# Patient Record
Sex: Female | Born: 1960 | Race: Black or African American | Hispanic: No | Marital: Single | Smoking: Former smoker
Health system: Southern US, Community
[De-identification: ages and names within clinical notes are randomized; demographics above are authoritative.]

## PROBLEM LIST (undated history)

## (undated) DIAGNOSIS — E119 Type 2 diabetes mellitus without complications: Secondary | ICD-10-CM

## (undated) DIAGNOSIS — R079 Chest pain, unspecified: Secondary | ICD-10-CM

## (undated) DIAGNOSIS — Z8701 Personal history of pneumonia (recurrent): Secondary | ICD-10-CM

## (undated) DIAGNOSIS — D126 Benign neoplasm of colon, unspecified: Secondary | ICD-10-CM

## (undated) DIAGNOSIS — I509 Heart failure, unspecified: Secondary | ICD-10-CM

## (undated) DIAGNOSIS — R51 Headache: Secondary | ICD-10-CM

## (undated) DIAGNOSIS — E66812 Obesity, class 2: Secondary | ICD-10-CM

## (undated) DIAGNOSIS — I2510 Atherosclerotic heart disease of native coronary artery without angina pectoris: Secondary | ICD-10-CM

## (undated) DIAGNOSIS — F329 Major depressive disorder, single episode, unspecified: Secondary | ICD-10-CM

## (undated) DIAGNOSIS — E669 Obesity, unspecified: Secondary | ICD-10-CM

## (undated) DIAGNOSIS — K219 Gastro-esophageal reflux disease without esophagitis: Secondary | ICD-10-CM

## (undated) DIAGNOSIS — M545 Low back pain, unspecified: Secondary | ICD-10-CM

## (undated) DIAGNOSIS — K760 Fatty (change of) liver, not elsewhere classified: Secondary | ICD-10-CM

## (undated) DIAGNOSIS — F32A Depression, unspecified: Secondary | ICD-10-CM

## (undated) DIAGNOSIS — K649 Unspecified hemorrhoids: Secondary | ICD-10-CM

## (undated) DIAGNOSIS — F419 Anxiety disorder, unspecified: Secondary | ICD-10-CM

## (undated) DIAGNOSIS — T7840XA Allergy, unspecified, initial encounter: Secondary | ICD-10-CM

## (undated) DIAGNOSIS — I10 Essential (primary) hypertension: Secondary | ICD-10-CM

## (undated) DIAGNOSIS — G8929 Other chronic pain: Secondary | ICD-10-CM

## (undated) DIAGNOSIS — K828 Other specified diseases of gallbladder: Secondary | ICD-10-CM

## (undated) DIAGNOSIS — E785 Hyperlipidemia, unspecified: Secondary | ICD-10-CM

## (undated) DIAGNOSIS — J189 Pneumonia, unspecified organism: Secondary | ICD-10-CM

## (undated) DIAGNOSIS — M1990 Unspecified osteoarthritis, unspecified site: Secondary | ICD-10-CM

## (undated) DIAGNOSIS — M25519 Pain in unspecified shoulder: Secondary | ICD-10-CM

## (undated) DIAGNOSIS — R7303 Prediabetes: Secondary | ICD-10-CM

## (undated) HISTORY — DX: Chest pain, unspecified: R07.9

## (undated) HISTORY — DX: Essential (primary) hypertension: I10

## (undated) HISTORY — DX: Unspecified osteoarthritis, unspecified site: M19.90

## (undated) HISTORY — DX: Obesity, class 2: E66.812

## (undated) HISTORY — DX: Personal history of pneumonia (recurrent): Z87.01

## (undated) HISTORY — DX: Other chronic pain: G89.29

## (undated) HISTORY — DX: Depression, unspecified: F32.A

## (undated) HISTORY — PX: TONSILLECTOMY: SUR1361

## (undated) HISTORY — DX: Other specified diseases of gallbladder: K82.8

## (undated) HISTORY — DX: Gastro-esophageal reflux disease without esophagitis: K21.9

## (undated) HISTORY — PX: LUMBAR DISC SURGERY: SHX700

## (undated) HISTORY — DX: Allergy, unspecified, initial encounter: T78.40XA

## (undated) HISTORY — DX: Anxiety disorder, unspecified: F41.9

## (undated) HISTORY — DX: Fatty (change of) liver, not elsewhere classified: K76.0

## (undated) HISTORY — DX: Unspecified hemorrhoids: K64.9

## (undated) HISTORY — PX: TISSUE GRAFT: SHX251

## (undated) HISTORY — DX: Major depressive disorder, single episode, unspecified: F32.9

## (undated) HISTORY — DX: Low back pain: M54.5

## (undated) HISTORY — DX: Low back pain, unspecified: M54.50

## (undated) HISTORY — DX: Hyperlipidemia, unspecified: E78.5

## (undated) HISTORY — DX: Obesity, unspecified: E66.9

## (undated) HISTORY — DX: Benign neoplasm of colon, unspecified: D12.6

---

## 1995-03-26 HISTORY — PX: BRAIN TUMOR EXCISION: SHX577

## 1997-06-27 ENCOUNTER — Encounter: Admission: RE | Admit: 1997-06-27 | Discharge: 1997-06-27 | Payer: Self-pay | Admitting: Family Medicine

## 1997-07-12 ENCOUNTER — Encounter: Admission: RE | Admit: 1997-07-12 | Discharge: 1997-07-12 | Payer: Self-pay | Admitting: Family Medicine

## 1997-09-28 ENCOUNTER — Ambulatory Visit (HOSPITAL_COMMUNITY): Admission: RE | Admit: 1997-09-28 | Discharge: 1997-09-28 | Payer: Self-pay | Admitting: Neurological Surgery

## 1998-05-16 ENCOUNTER — Encounter: Admission: RE | Admit: 1998-05-16 | Discharge: 1998-05-16 | Payer: Self-pay | Admitting: Sports Medicine

## 1998-09-15 ENCOUNTER — Encounter: Admission: RE | Admit: 1998-09-15 | Discharge: 1998-09-15 | Payer: Self-pay | Admitting: Family Medicine

## 1999-03-01 ENCOUNTER — Encounter: Admission: RE | Admit: 1999-03-01 | Discharge: 1999-03-01 | Payer: Self-pay | Admitting: Family Medicine

## 2000-02-27 ENCOUNTER — Encounter: Admission: RE | Admit: 2000-02-27 | Discharge: 2000-02-27 | Payer: Self-pay | Admitting: Family Medicine

## 2000-03-06 ENCOUNTER — Encounter: Admission: RE | Admit: 2000-03-06 | Discharge: 2000-03-06 | Payer: Self-pay | Admitting: Family Medicine

## 2000-03-20 ENCOUNTER — Encounter: Admission: RE | Admit: 2000-03-20 | Discharge: 2000-03-20 | Payer: Self-pay | Admitting: Family Medicine

## 2000-03-24 ENCOUNTER — Encounter: Admission: RE | Admit: 2000-03-24 | Discharge: 2000-03-24 | Payer: Self-pay | Admitting: *Deleted

## 2001-03-04 ENCOUNTER — Encounter: Admission: RE | Admit: 2001-03-04 | Discharge: 2001-03-04 | Payer: Self-pay | Admitting: Family Medicine

## 2001-11-02 ENCOUNTER — Encounter: Admission: RE | Admit: 2001-11-02 | Discharge: 2001-11-02 | Payer: Self-pay | Admitting: Family Medicine

## 2001-11-09 ENCOUNTER — Encounter: Admission: RE | Admit: 2001-11-09 | Discharge: 2001-11-09 | Payer: Self-pay | Admitting: Family Medicine

## 2003-02-11 ENCOUNTER — Encounter: Admission: RE | Admit: 2003-02-11 | Discharge: 2003-02-11 | Payer: Self-pay | Admitting: Sports Medicine

## 2003-02-14 ENCOUNTER — Encounter: Admission: RE | Admit: 2003-02-14 | Discharge: 2003-02-14 | Payer: Self-pay | Admitting: Family Medicine

## 2003-03-07 ENCOUNTER — Encounter: Admission: RE | Admit: 2003-03-07 | Discharge: 2003-03-07 | Payer: Self-pay | Admitting: Family Medicine

## 2003-03-11 ENCOUNTER — Ambulatory Visit (HOSPITAL_COMMUNITY): Admission: RE | Admit: 2003-03-11 | Discharge: 2003-03-11 | Payer: Self-pay | Admitting: Family Medicine

## 2003-04-27 ENCOUNTER — Encounter: Admission: RE | Admit: 2003-04-27 | Discharge: 2003-04-27 | Payer: Self-pay | Admitting: Sports Medicine

## 2004-03-07 ENCOUNTER — Ambulatory Visit: Payer: Self-pay | Admitting: Family Medicine

## 2004-12-27 ENCOUNTER — Ambulatory Visit: Payer: Self-pay | Admitting: Family Medicine

## 2004-12-28 ENCOUNTER — Encounter: Admission: RE | Admit: 2004-12-28 | Discharge: 2004-12-28 | Payer: Self-pay | Admitting: Sports Medicine

## 2005-01-07 ENCOUNTER — Ambulatory Visit: Payer: Self-pay | Admitting: Family Medicine

## 2005-05-23 ENCOUNTER — Encounter (INDEPENDENT_AMBULATORY_CARE_PROVIDER_SITE_OTHER): Payer: Self-pay | Admitting: *Deleted

## 2005-05-23 LAB — CONVERTED CEMR LAB

## 2005-05-25 ENCOUNTER — Emergency Department (HOSPITAL_COMMUNITY): Admission: EM | Admit: 2005-05-25 | Discharge: 2005-05-25 | Payer: Self-pay | Admitting: Emergency Medicine

## 2005-06-12 ENCOUNTER — Ambulatory Visit: Payer: Self-pay | Admitting: Family Medicine

## 2005-09-27 ENCOUNTER — Ambulatory Visit: Payer: Self-pay | Admitting: Family Medicine

## 2005-12-19 ENCOUNTER — Ambulatory Visit: Payer: Self-pay | Admitting: Sports Medicine

## 2006-05-23 ENCOUNTER — Encounter (INDEPENDENT_AMBULATORY_CARE_PROVIDER_SITE_OTHER): Payer: Self-pay | Admitting: *Deleted

## 2006-08-04 ENCOUNTER — Ambulatory Visit: Payer: Self-pay | Admitting: Sports Medicine

## 2006-08-04 DIAGNOSIS — I10 Essential (primary) hypertension: Secondary | ICD-10-CM

## 2006-08-04 DIAGNOSIS — E669 Obesity, unspecified: Secondary | ICD-10-CM | POA: Insufficient documentation

## 2006-08-08 ENCOUNTER — Telehealth (INDEPENDENT_AMBULATORY_CARE_PROVIDER_SITE_OTHER): Payer: Self-pay | Admitting: *Deleted

## 2006-08-11 ENCOUNTER — Telehealth: Payer: Self-pay | Admitting: Family Medicine

## 2006-08-22 ENCOUNTER — Ambulatory Visit: Payer: Self-pay | Admitting: Family Medicine

## 2006-09-08 ENCOUNTER — Telehealth: Payer: Self-pay | Admitting: *Deleted

## 2006-09-08 ENCOUNTER — Ambulatory Visit: Payer: Self-pay | Admitting: Family Medicine

## 2006-09-08 DIAGNOSIS — K219 Gastro-esophageal reflux disease without esophagitis: Secondary | ICD-10-CM | POA: Insufficient documentation

## 2006-09-08 DIAGNOSIS — J309 Allergic rhinitis, unspecified: Secondary | ICD-10-CM | POA: Insufficient documentation

## 2006-09-15 ENCOUNTER — Ambulatory Visit: Payer: Self-pay | Admitting: Family Medicine

## 2006-09-15 DIAGNOSIS — F418 Other specified anxiety disorders: Secondary | ICD-10-CM | POA: Insufficient documentation

## 2006-09-18 ENCOUNTER — Telehealth: Payer: Self-pay | Admitting: *Deleted

## 2006-09-18 ENCOUNTER — Encounter (INDEPENDENT_AMBULATORY_CARE_PROVIDER_SITE_OTHER): Payer: Self-pay | Admitting: Family Medicine

## 2006-09-18 ENCOUNTER — Encounter: Admission: RE | Admit: 2006-09-18 | Discharge: 2006-09-18 | Payer: Self-pay | Admitting: Sports Medicine

## 2006-09-18 ENCOUNTER — Ambulatory Visit: Payer: Self-pay | Admitting: Sports Medicine

## 2006-09-18 LAB — CONVERTED CEMR LAB
BUN: 21 mg/dL (ref 6–23)
Basophils Absolute: 0.2 10*3/uL
CO2: 29 meq/L (ref 19–32)
Calcium: 9.1 mg/dL (ref 8.4–10.5)
Chloride: 99 meq/L (ref 96–112)
Creatinine, Ser: 1.12 mg/dL (ref 0.40–1.20)
Eosinophils Absolute: 0.7 10*3/uL
Glucose, Bld: 94 mg/dL (ref 70–99)
Granulocyte count absolute: 4 10*3/uL
Granulocyte percent: 51.9 %
HCT: 36.3 %
Hemoglobin: 12.2 g/dL
Lymphocytes Relative: 43.2 %
Lymphs Abs: 3.3 10*3/uL
MCV: 86.1 fL
Monocytes Absolute: 0.4 10*3/uL
Monocytes Relative: 4.9 %
Platelets: 192 10*3/uL
Potassium: 3.4 meq/L — ABNORMAL LOW (ref 3.5–5.3)
RBC: 4.22 M/uL
Sodium: 140 meq/L (ref 135–145)
WBC: 7.7 10*3/uL

## 2006-09-19 ENCOUNTER — Telehealth (INDEPENDENT_AMBULATORY_CARE_PROVIDER_SITE_OTHER): Payer: Self-pay | Admitting: Family Medicine

## 2006-09-21 ENCOUNTER — Encounter (INDEPENDENT_AMBULATORY_CARE_PROVIDER_SITE_OTHER): Payer: Self-pay | Admitting: Family Medicine

## 2006-10-07 ENCOUNTER — Ambulatory Visit: Payer: Self-pay | Admitting: Family Medicine

## 2006-10-16 ENCOUNTER — Telehealth: Payer: Self-pay | Admitting: Family Medicine

## 2006-10-21 ENCOUNTER — Telehealth (INDEPENDENT_AMBULATORY_CARE_PROVIDER_SITE_OTHER): Payer: Self-pay | Admitting: *Deleted

## 2007-01-27 ENCOUNTER — Telehealth: Payer: Self-pay | Admitting: *Deleted

## 2007-02-02 ENCOUNTER — Telehealth: Payer: Self-pay | Admitting: *Deleted

## 2007-02-02 ENCOUNTER — Telehealth (INDEPENDENT_AMBULATORY_CARE_PROVIDER_SITE_OTHER): Payer: Self-pay | Admitting: *Deleted

## 2007-03-17 ENCOUNTER — Telehealth: Payer: Self-pay | Admitting: Family Medicine

## 2007-03-30 ENCOUNTER — Ambulatory Visit: Payer: Self-pay | Admitting: Sports Medicine

## 2007-04-24 ENCOUNTER — Telehealth: Payer: Self-pay | Admitting: *Deleted

## 2007-05-25 ENCOUNTER — Ambulatory Visit (HOSPITAL_COMMUNITY): Admission: RE | Admit: 2007-05-25 | Discharge: 2007-05-25 | Payer: Self-pay | Admitting: Family Medicine

## 2007-05-26 ENCOUNTER — Inpatient Hospital Stay (HOSPITAL_COMMUNITY): Admission: EM | Admit: 2007-05-26 | Discharge: 2007-05-27 | Payer: Self-pay | Admitting: Emergency Medicine

## 2007-05-26 ENCOUNTER — Ambulatory Visit: Payer: Self-pay | Admitting: Family Medicine

## 2007-05-27 ENCOUNTER — Encounter: Payer: Self-pay | Admitting: Family Medicine

## 2007-05-29 ENCOUNTER — Telehealth: Payer: Self-pay | Admitting: Family Medicine

## 2007-06-03 ENCOUNTER — Encounter: Payer: Self-pay | Admitting: Family Medicine

## 2007-06-05 ENCOUNTER — Ambulatory Visit: Payer: Self-pay | Admitting: Family Medicine

## 2007-06-10 ENCOUNTER — Telehealth: Payer: Self-pay | Admitting: Family Medicine

## 2007-09-11 ENCOUNTER — Telehealth: Payer: Self-pay | Admitting: *Deleted

## 2007-12-30 ENCOUNTER — Ambulatory Visit: Payer: Self-pay | Admitting: Family Medicine

## 2007-12-30 ENCOUNTER — Telehealth: Payer: Self-pay | Admitting: *Deleted

## 2008-01-05 ENCOUNTER — Telehealth (INDEPENDENT_AMBULATORY_CARE_PROVIDER_SITE_OTHER): Payer: Self-pay | Admitting: *Deleted

## 2008-01-05 ENCOUNTER — Ambulatory Visit: Payer: Self-pay | Admitting: Family Medicine

## 2008-01-06 ENCOUNTER — Telehealth: Payer: Self-pay | Admitting: *Deleted

## 2008-02-26 ENCOUNTER — Ambulatory Visit: Payer: Self-pay | Admitting: Family Medicine

## 2008-03-02 ENCOUNTER — Telehealth: Payer: Self-pay | Admitting: *Deleted

## 2008-04-01 ENCOUNTER — Encounter: Payer: Self-pay | Admitting: Family Medicine

## 2008-04-01 ENCOUNTER — Ambulatory Visit: Payer: Self-pay | Admitting: Family Medicine

## 2008-04-04 LAB — CONVERTED CEMR LAB
BUN: 21 mg/dL (ref 6–23)
CO2: 25 meq/L (ref 19–32)
Calcium: 8.5 mg/dL (ref 8.4–10.5)
Chloride: 103 meq/L (ref 96–112)
Cholesterol: 235 mg/dL — ABNORMAL HIGH (ref 0–200)
Creatinine, Ser: 1.05 mg/dL (ref 0.40–1.20)
Glucose, Bld: 91 mg/dL (ref 70–99)
HDL: 40 mg/dL (ref >39)
LDL Cholesterol: 134 mg/dL — ABNORMAL HIGH (ref 0–99)
Potassium: 4 meq/L (ref 3.5–5.3)
Sodium: 141 meq/L (ref 135–145)
Total CHOL/HDL Ratio: 5.9
Triglycerides: 307 mg/dL — ABNORMAL HIGH (ref <150)
VLDL: 61 mg/dL — ABNORMAL HIGH (ref 0–40)

## 2008-04-19 ENCOUNTER — Ambulatory Visit: Payer: Self-pay | Admitting: Family Medicine

## 2008-04-26 ENCOUNTER — Ambulatory Visit (HOSPITAL_COMMUNITY): Admission: RE | Admit: 2008-04-26 | Discharge: 2008-04-26 | Payer: Self-pay | Admitting: Family Medicine

## 2008-04-26 ENCOUNTER — Encounter: Payer: Self-pay | Admitting: Family Medicine

## 2008-04-26 ENCOUNTER — Ambulatory Visit: Payer: Self-pay | Admitting: Family Medicine

## 2008-04-26 DIAGNOSIS — N938 Other specified abnormal uterine and vaginal bleeding: Secondary | ICD-10-CM | POA: Insufficient documentation

## 2008-04-27 ENCOUNTER — Telehealth: Payer: Self-pay | Admitting: Family Medicine

## 2008-04-29 ENCOUNTER — Encounter: Payer: Self-pay | Admitting: Family Medicine

## 2008-04-29 ENCOUNTER — Ambulatory Visit: Payer: Self-pay | Admitting: Family Medicine

## 2008-04-29 DIAGNOSIS — D259 Leiomyoma of uterus, unspecified: Secondary | ICD-10-CM | POA: Insufficient documentation

## 2008-05-05 ENCOUNTER — Ambulatory Visit: Payer: Self-pay | Admitting: Gynecology

## 2008-05-05 ENCOUNTER — Encounter: Payer: Self-pay | Admitting: Gynecology

## 2008-05-05 ENCOUNTER — Other Ambulatory Visit: Admission: RE | Admit: 2008-05-05 | Discharge: 2008-05-05 | Payer: Self-pay | Admitting: Gynecology

## 2008-05-05 ENCOUNTER — Encounter: Payer: Self-pay | Admitting: Family Medicine

## 2008-06-02 ENCOUNTER — Ambulatory Visit: Payer: Self-pay | Admitting: Gynecology

## 2008-06-02 ENCOUNTER — Encounter: Payer: Self-pay | Admitting: Family Medicine

## 2008-06-17 ENCOUNTER — Ambulatory Visit: Payer: Self-pay | Admitting: Gynecology

## 2008-08-15 ENCOUNTER — Encounter: Payer: Self-pay | Admitting: Family Medicine

## 2008-10-17 ENCOUNTER — Telehealth: Payer: Self-pay | Admitting: *Deleted

## 2008-11-02 ENCOUNTER — Telehealth: Payer: Self-pay | Admitting: *Deleted

## 2008-11-17 ENCOUNTER — Telehealth (INDEPENDENT_AMBULATORY_CARE_PROVIDER_SITE_OTHER): Payer: Self-pay | Admitting: *Deleted

## 2008-12-26 ENCOUNTER — Ambulatory Visit: Payer: Self-pay | Admitting: Family Medicine

## 2009-01-13 ENCOUNTER — Telehealth: Payer: Self-pay | Admitting: Family Medicine

## 2009-01-13 ENCOUNTER — Encounter: Payer: Self-pay | Admitting: Family Medicine

## 2009-01-13 ENCOUNTER — Ambulatory Visit: Payer: Self-pay | Admitting: Family Medicine

## 2009-01-13 DIAGNOSIS — M545 Low back pain, unspecified: Secondary | ICD-10-CM | POA: Insufficient documentation

## 2009-02-07 ENCOUNTER — Encounter: Payer: Self-pay | Admitting: Family Medicine

## 2009-03-01 ENCOUNTER — Telehealth: Payer: Self-pay | Admitting: Family Medicine

## 2009-03-01 ENCOUNTER — Ambulatory Visit: Payer: Self-pay | Admitting: Family Medicine

## 2009-03-10 ENCOUNTER — Encounter: Payer: Self-pay | Admitting: Family Medicine

## 2009-03-14 ENCOUNTER — Telehealth: Payer: Self-pay | Admitting: *Deleted

## 2009-03-15 ENCOUNTER — Encounter: Payer: Self-pay | Admitting: Family Medicine

## 2009-04-25 ENCOUNTER — Encounter: Payer: Self-pay | Admitting: Family Medicine

## 2009-05-22 ENCOUNTER — Ambulatory Visit: Payer: Self-pay | Admitting: Family Medicine

## 2009-05-22 ENCOUNTER — Telehealth: Payer: Self-pay | Admitting: Family Medicine

## 2009-07-27 ENCOUNTER — Ambulatory Visit: Payer: Self-pay | Admitting: Family Medicine

## 2009-07-27 ENCOUNTER — Encounter: Payer: Self-pay | Admitting: *Deleted

## 2009-07-27 ENCOUNTER — Encounter: Payer: Self-pay | Admitting: Family Medicine

## 2009-07-31 ENCOUNTER — Emergency Department (HOSPITAL_COMMUNITY): Admission: EM | Admit: 2009-07-31 | Discharge: 2009-08-01 | Payer: Self-pay | Admitting: Emergency Medicine

## 2009-07-31 ENCOUNTER — Telehealth: Payer: Self-pay | Admitting: Family Medicine

## 2009-07-31 ENCOUNTER — Emergency Department (HOSPITAL_COMMUNITY): Admission: EM | Admit: 2009-07-31 | Discharge: 2009-07-31 | Payer: Self-pay | Admitting: Emergency Medicine

## 2009-08-04 ENCOUNTER — Encounter: Payer: Self-pay | Admitting: *Deleted

## 2009-08-04 ENCOUNTER — Ambulatory Visit: Payer: Self-pay | Admitting: Family Medicine

## 2009-08-04 ENCOUNTER — Encounter: Payer: Self-pay | Admitting: Family Medicine

## 2009-08-04 DIAGNOSIS — E785 Hyperlipidemia, unspecified: Secondary | ICD-10-CM | POA: Insufficient documentation

## 2009-08-04 LAB — CONVERTED CEMR LAB
ALT: 14 units/L (ref 0–35)
AST: 14 units/L (ref 0–37)
Albumin: 4.2 g/dL (ref 3.5–5.2)
Alkaline Phosphatase: 70 units/L (ref 39–117)
BUN: 14 mg/dL (ref 6–23)
CO2: 25 meq/L (ref 19–32)
Calcium: 9.3 mg/dL (ref 8.4–10.5)
Chloride: 103 meq/L (ref 96–112)
Cholesterol: 214 mg/dL — ABNORMAL HIGH (ref 0–200)
Creatinine, Ser: 0.96 mg/dL (ref 0.40–1.20)
Glucose, Bld: 94 mg/dL (ref 70–99)
HCT: 38.8 % (ref 36.0–46.0)
HDL: 39 mg/dL — ABNORMAL LOW (ref >39)
Hemoglobin: 12.4 g/dL (ref 12.0–15.0)
LDL Cholesterol: 134 mg/dL — ABNORMAL HIGH (ref 0–99)
MCHC: 32 g/dL (ref 30.0–36.0)
MCV: 85.8 fL (ref 78.0–100.0)
Platelets: 201 10*3/uL (ref 150–400)
Potassium: 4.5 meq/L (ref 3.5–5.3)
RBC: 4.52 M/uL (ref 3.87–5.11)
RDW: 16.7 % — ABNORMAL HIGH (ref 11.5–15.5)
Sodium: 137 meq/L (ref 135–145)
TSH: 0.856 microintl units/mL (ref 0.350–4.500)
Total Bilirubin: 0.4 mg/dL (ref 0.3–1.2)
Total CHOL/HDL Ratio: 5.5
Total Protein: 6.6 g/dL (ref 6.0–8.3)
Triglycerides: 206 mg/dL — ABNORMAL HIGH (ref <150)
VLDL: 41 mg/dL — ABNORMAL HIGH (ref 0–40)
WBC: 7.1 10*3/uL (ref 4.0–10.5)

## 2009-08-05 ENCOUNTER — Telehealth: Payer: Self-pay | Admitting: *Deleted

## 2009-08-07 ENCOUNTER — Encounter: Payer: Self-pay | Admitting: Family Medicine

## 2009-08-24 ENCOUNTER — Encounter: Payer: Self-pay | Admitting: Family Medicine

## 2009-09-01 ENCOUNTER — Encounter (INDEPENDENT_AMBULATORY_CARE_PROVIDER_SITE_OTHER): Payer: Self-pay | Admitting: *Deleted

## 2009-09-03 ENCOUNTER — Emergency Department (HOSPITAL_COMMUNITY): Admission: EM | Admit: 2009-09-03 | Discharge: 2009-09-03 | Payer: Self-pay | Admitting: Emergency Medicine

## 2009-09-11 ENCOUNTER — Ambulatory Visit (HOSPITAL_COMMUNITY): Admission: RE | Admit: 2009-09-11 | Discharge: 2009-09-11 | Payer: Self-pay | Admitting: Neurological Surgery

## 2009-09-12 ENCOUNTER — Ambulatory Visit: Payer: Self-pay | Admitting: Family Medicine

## 2009-09-12 ENCOUNTER — Encounter (INDEPENDENT_AMBULATORY_CARE_PROVIDER_SITE_OTHER): Payer: Self-pay | Admitting: *Deleted

## 2009-09-12 DIAGNOSIS — K8020 Calculus of gallbladder without cholecystitis without obstruction: Secondary | ICD-10-CM | POA: Insufficient documentation

## 2009-09-14 ENCOUNTER — Telehealth: Payer: Self-pay | Admitting: Family Medicine

## 2009-10-24 ENCOUNTER — Ambulatory Visit: Admission: RE | Admit: 2009-10-24 | Discharge: 2009-10-24 | Payer: Self-pay | Admitting: Neurological Surgery

## 2010-01-23 ENCOUNTER — Encounter: Payer: Self-pay | Admitting: Family Medicine

## 2010-02-02 ENCOUNTER — Ambulatory Visit: Payer: Self-pay | Admitting: Family Medicine

## 2010-02-02 ENCOUNTER — Encounter: Payer: Self-pay | Admitting: Family Medicine

## 2010-02-28 ENCOUNTER — Ambulatory Visit: Payer: Self-pay

## 2010-03-29 ENCOUNTER — Ambulatory Visit: Admit: 2010-03-29 | Payer: Self-pay

## 2010-04-26 NOTE — Letter (Signed)
Summary: Out of Work  [facility]  [address]   [place], [place] [address]   Phone: [phone]  Fax: [phone]    Jul 27, 2009   Employee:  TERRYL NIZIOLEK    To Whom It May Concern:   For Medical reasons, please excuse the above named employee from work for the following dates:  Start:   Jul 25, 2009  End:   Jul 28, 2009  If you need additional information, please feel free to contact our office.         Sincerely,    Loralee Pacas CMA

## 2010-04-26 NOTE — Consult Note (Signed)
Summary: [facility]  [facility]   Imported By: Clydell Hakim 08/21/2009 09:04:22  _____________________________________________________________________  External Attachment:    Type:   Image     Comment:   External Document

## 2010-04-26 NOTE — Miscellaneous (Signed)
Summary: SUMMARY  50 y/o female with family h/o early cardiac death. Has HTN, HLD. Has had normal cath (2009).   Impression & Recommendations:  Problem # 1:  HYPERTENSION, BENIGN ESSENTIAL (ICD-401.1) Assessment Comment Only BP pretty much at goal on current regimen. Amlodipine was actually added by cardiology prior to maximizing lisinopril-hctz combo. no problems tolerating medications.   Her updated medication list for this problem includes:    Metoprolol Tartrate 25 Mg Tabs (Metoprolol tartrate) .Marland Kitchen... 1 tab by mouth two times a day    Lisinopril-hydrochlorothiazide 20-12.5 Mg Tabs (Lisinopril-hydrochlorothiazide) ..... One tab by mouth daily    Amlodipine Besylate 5 Mg Tabs (Amlodipine besylate) ..... One tab by mouth daily  Prior BP: 135/90 (08/04/2009)  Labs Reviewed: K+: 4.5 (08/04/2009) Creat: : 0.96 (08/04/2009)   Chol: 214 (08/04/2009)   HDL: 39 (08/04/2009)   LDL: 134 (08/04/2009)   TG: 206 (08/04/2009)  Problem # 2:  HYPERLIPIDEMIA (ICD-272.4) Assessment: Comment Only patient against statins in past. unable to exercise much 2/2 back pain.   Labs Reviewed: SGOT: 14 (08/04/2009)   SGPT: 14 (08/04/2009)   HDL:39 (08/04/2009), 40 (04/01/2008)  LDL:134 (08/04/2009), 134 (04/01/2008)  Chol:214 (08/04/2009), 235 (04/01/2008)  Trig:206 (08/04/2009), 307 (04/01/2008)  Problem # 3:  BACK PAIN, LUMBAR (ICD-724.2) Assessment: Comment Only seen by Dr. Danielle Dess at [facility]. several recent flare ups requiring courses of steroids and hydrocodone-acetaminophen combo. Vicodin NOT chronic med, and patient is does not seem to be the type to ask for lots of pain medications. She has not in the past.   The following medications were removed from the medication list:    Hydrocodone-acetaminophen 5-500 Mg Tabs (Hydrocodone-acetaminophen) ..... One tab by mouth q4-q6 hours as needed for pain. Her updated medication list for this problem includes:    Flexeril 10 Mg Tabs  (Cyclobenzaprine hcl) ..... One by mouth three times a day as needed back pain    Ibuprofen 800 Mg Tabs (Ibuprofen) .Marland Kitchen... 1 by mouth three times a day prn  Problem # 4:  ANXIETY STATE NOS (ICD-300.00) Assessment: Comment Only previously on clonazepam. thought to be etiology of chest pain issues in the past. last refill in February 2011. No refill requests as of yet. Will remove from list to be addressed by next provider, unless request obtained in next 2-3 weeks.   The following medications were removed from the medication list:    Klonopin 0.5 Mg Tabs (Clonazepam) ..... One tab by mouth three times a day as needed  Problem # 5:  DYSFUNCTIONAL UTERINE BLEEDING (ICD-626.8) Assessment: Comment Only seen by [facility] in past. trial of Depo-Lupron last year. several small fibroids.   Problem # 6:  GERD (ICD-530.81) Assessment: Comment Only on protonix.   Her updated medication list for this problem includes:    Protonix 40 Mg Tbec (Pantoprazole sodium) ..... One tab by mouth daily  Problem # 7:  RHINITIS, ALLERGIC NOS (ICD-477.9) Assessment: Comment Only recently prescribed patanol for allergic conjunctivitis/eyelid swelling.   Her updated medication list for this problem includes:    Loratadine 10 Mg Tabs (Loratadine) ..... One tab by mouth daily  Complete Medication List: 1)  Flexeril 10 Mg Tabs (Cyclobenzaprine hcl) .... One by mouth three times a day as needed back pain 2)  Ibuprofen 800 Mg Tabs (Ibuprofen) .Marland Kitchen.. 1 by mouth three times a day prn 3)  Protonix 40 Mg Tbec (Pantoprazole sodium) .... One tab by mouth daily 4)  Metoprolol Tartrate 25 Mg Tabs (Metoprolol tartrate) .Marland KitchenMarland KitchenMarland Kitchen  1 tab by mouth two times a day 5)  Lisinopril-hydrochlorothiazide 20-12.5 Mg Tabs (Lisinopril-hydrochlorothiazide) .... One tab by mouth daily 6)  Amlodipine Besylate 5 Mg Tabs (Amlodipine besylate) .... One tab by mouth daily 7)  Loratadine 10 Mg Tabs (Loratadine) .... One tab by mouth daily 8)   Patanol 0.1 % Soln (Olopatadine hcl) .... One drop in affected eye twice a day. disp 5 ml.

## 2010-04-26 NOTE — Assessment & Plan Note (Signed)
Summary: F/U HTN/Sign Medical Consult form/dsl   Vital Signs:  Patient profile:   50 year old female Height:      68 inches Weight:      222.25 pounds BMI:     33.92 Temp:     98.1 degrees F oral Pulse rate:   85 / minute BP sitting:   165 / 117  (left arm) Cuff size:   large  Vitals Entered By: Jimmy Footman, CMA (February 02, 2010 3:25 PM) CC: meet new doc, refill HTN & pain meds, knee & back pain 10/10 Is Patient Diabetic? No Pain Assessment Patient in pain? yes     Location: back & knees Intensity: 10 Type: sharp   Primary Provider:  Barnabas Lister  MD  CC:  meet new doc, refill HTN & pain meds, and knee & back pain 10/10.  History of Present Illness: 50 yo female presents to clinic to meet her new MD, refil meds, and get medical consent for teeth cleaning.  Pt c/o chronic bilateral knee pain and low back pain >20 years.   Has been seen by Dr. Danielle Dess, neurosurgeon, who was going to operate, but pt lost her job and her insurance.  He refills her Vicodin.  She also takes Motrin 800mg  for pain and Flexeril 15mg .    Pt also has HTN and anxiety.  Her BP today is 165/117.  Rechecked manually 140/90.  Needs refills for Amlodipine and Lisinopril/HCTZ.  Medical consent for dental cleaning requires a blood pressure range safe for teeth cleaning.  Pt currently taking Clonazepam 1mg  three times a day for anxiety.    ROS: Endorses back pain.  Denies fever, CP, SOB, fatigue, falls, N/V, abdominal pain.  Preventive Screening-Counseling & Management  Alcohol-Tobacco     Smoking Status: quit  Current Medications (verified): 1)  Flexeril 10 Mg  Tabs (Cyclobenzaprine Hcl) .... One By Mouth Three Times A Day As Needed Back Pain 2)  Ibuprofen 800 Mg  Tabs (Ibuprofen) .Marland Kitchen.. 1 By Mouth Three Times A Day Prn 3)  Protonix 40 Mg  Tbec (Pantoprazole Sodium) .... One Tab By Mouth Two Times A Day 4)  Metoprolol Tartrate 50 Mg Tabs (Metoprolol Tartrate) .... Take 1 Tab By Mouth Two Times Per Day 5)   Lisinopril-Hydrochlorothiazide 20-12.5 Mg Tabs (Lisinopril-Hydrochlorothiazide) .... One Tab By Mouth Daily 6)  Amlodipine Besylate 5 Mg Tabs (Amlodipine Besylate) .... One Tab By Mouth Daily 7)  Loratadine 10 Mg Tabs (Loratadine) .... One Tab By Mouth Daily 8)  Patanol 0.1 % Soln (Olopatadine Hcl) .... One Drop in Affected Eye Twice A Day. Disp 5 Ml. 9)  Clonazepam 1 Mg Tabs (Clonazepam) .... One Tab By Mouth Every 12 Hrs As Needed For Anxiety 10)  Ambien 5 Mg Tabs (Zolpidem Tartrate) .... Take One Tablet At Bedtime As Needed For Insomnia.  Allergies (verified): No Known Drug Allergies  Social History: Smoking Status:  quit  Review of Systems       per HPI  Physical Exam  General:  alert and cooperative, in no acute distress Lungs:  Normal respiratory effort, chest expands symmetrically. Lungs are clear to auscultation, no crackles or wheezes. Heart:  Normal rate and regular rhythm. S1 and S2 normal without gallop, murmur, click, rub or other extra sounds. Neurologic:  alert & oriented X3, cranial nerves II-XII intact, and gait normal.     Impression & Recommendations:  Problem # 1:  ANXIETY STATE NOS (ICD-300.00) Assessment Unchanged Pt has a hx of anxiety and  depression.  Pt says she cannot tolerate SSRI due to adverse effects/anger issues.  Pt was started on Clonazepam and dosage was increased to 1mg  three times a day in June.  Encouraged pt to take Clonazepam twice a day and we will meet in 1 month to discuss switching to SSRI with less side effects.  Will give pt enough tablets to take 2-3 tablets a day as needed.  Her updated medication list for this problem includes:    Clonazepam 1 Mg Tabs (Clonazepam) ..... One tab by mouth every 12 hrs as needed for anxiety  Orders: [facility]- Est Level  3 ([address])  Problem # 2:  HYPERTENSION, BENIGN ESSENTIAL (ICD-401.1) Assessment: Deteriorated BP elevated at 165/117.  Rechecked manually 140/90.  Signed medical consent for dental  cleaning.  Will refill hypertensive meds.  Will continue to monitor at next visit.  Her updated medication list for this problem includes:    Metoprolol Tartrate 50 Mg Tabs (Metoprolol tartrate) .Marland Kitchen... Take 1 tab by mouth two times per day    Lisinopril-hydrochlorothiazide 20-12.5 Mg Tabs (Lisinopril-hydrochlorothiazide) ..... One tab by mouth daily    Amlodipine Besylate 5 Mg Tabs (Amlodipine besylate) ..... One tab by mouth daily  Orders: [facility]- Est Level  3 ([address])  Problem # 3:  BACK PAIN, LUMBAR (ICD-724.2) Assessment: Comment Only Pt gets Vicodin from Dr. Danielle Dess.  Told pt she will need to get refills from his office.  Will refill Motrin.  Pt denies any GI upset.  Will consider changing to Tylenol at next visit.  Her updated medication list for this problem includes:    Flexeril 10 Mg Tabs (Cyclobenzaprine hcl) ..... One by mouth three times a day as needed back pain    Ibuprofen 800 Mg Tabs (Ibuprofen) .Marland Kitchen... 1 by mouth three times a day prn  Orders: [facility]- Est Level  3 ([address])  Patient Instructions: 1)  It was great to meet you today. 2)  Please take Clonazepam 1mg  every 12 hours as needed for anxiety. 3)  Please schedule a follow-up appt with me in 1 month. 4)  Thank you. Prescriptions: CLONAZEPAM 1 MG TABS (CLONAZEPAM) one tab by mouth every 12 hrs as needed for anxiety  #80 x 0   Entered and Authorized by:   Ivy de Lawson Radar  MD   Signed by:   Barnabas Lister  MD on 02/02/2010   Method used:   Print then Give to Patient   RxID:   [id number] AMBIEN 5 MG TABS (ZOLPIDEM TARTRATE) take one tablet at bedtime as needed for insomnia.  #30 x 0   Entered and Authorized by:   Ivy de Lawson Radar  MD   Signed by:   Ivy de Lawson Radar  MD on 02/02/2010   Method used:   Print then Give to Patient   RxID:   [id number] CLONAZEPAM 1 MG TABS (CLONAZEPAM) one tab by mouth every 12 hrs as needed for anxiety  #80 x 0   Entered and Authorized by:   Ivy de Lawson Radar  MD   Signed by:   Barnabas Lister  MD on 02/02/2010   Method used:   Print then Give to Patient   RxID:   [id number] LISINOPRIL-HYDROCHLOROTHIAZIDE 20-12.5 MG TABS (LISINOPRIL-HYDROCHLOROTHIAZIDE) one tab by mouth daily  #30 x 3   Entered and Authorized by:   Ivy de Lawson Radar  MD   Signed by:   Barnabas Lister  MD on 02/02/2010   Method  used:   Electronically to        [facility] (retail)       [address]       [place], [place]  [address]       Ph: [phone]       Fax: [phone]   RxID:   [id number] AMLODIPINE BESYLATE 5 MG TABS (AMLODIPINE BESYLATE) one tab by mouth daily  #30 x 3   Entered and Authorized by:   Ivy de Lawson Radar  MD   Signed by:   Barnabas Lister  MD on 02/02/2010   Method used:   Electronically to        [facility] (retail)       [address]       [place], [place]  [address]       Ph: [phone]       Fax: [phone]   RxID:   [id number] METOPROLOL TARTRATE 50 MG TABS (METOPROLOL TARTRATE) Take 1 tab by mouth two times per day  #60 x 3   Entered and Authorized by:   Ivy de Lawson Radar  MD   Signed by:   Barnabas Lister  MD on 02/02/2010   Method used:   Electronically to        [facility] (retail)       [address]       [place], [place]  [address]       Ph: [phone]       Fax: [phone]   RxID:   [id number] PROTONIX 40 MG  TBEC (PANTOPRAZOLE SODIUM) one tab by mouth two times a day  #60 x 3   Entered and Authorized by:   Ivy de Lawson Radar  MD   Signed by:   Barnabas Lister  MD on 02/02/2010   Method used:   Electronically to        [facility] (retail)       [address]       [place], [place]  [address]       Ph: [phone]       Fax: [phone]   RxID:   [id number] IBUPROFEN 800 MG  TABS (IBUPROFEN) 1 by mouth three times a day prn  #90 x 3   Entered and Authorized by:   Ivy de Lawson Radar  MD   Signed by:   Barnabas Lister   MD on 02/02/2010   Method used:   Electronically to        [facility] (retail)       [address]       [place], [place]  [address]       Ph: [phone]       Fax: [phone]   RxID:   [id number] FLEXERIL 10 MG  TABS (CYCLOBENZAPRINE HCL) one by mouth three times a day as needed back pain  #90 x 3   Entered and Authorized by:   Ivy de Lawson Radar  MD   Signed by:   Barnabas Lister  MD on 02/02/2010   Method used:   Electronically to        [facility]       [address]       [place], [place]  [address]       Ph: [phone]       Fax: [phone]   RxID:   [id number]    Orders Added: 1)  [facility]- Est Level  3 [[address]]

## 2010-04-26 NOTE — Miscellaneous (Signed)
Summary: [facility]  ROI   Imported By: [name] 08/07/2009 16:46:19  _____________________________________________________________________  External Attachment:    Type:   Image     Comment:   External Document

## 2010-04-26 NOTE — Miscellaneous (Signed)
Summary: [facility]  [facility]   Imported By: Clydell Hakim 09/20/2009 16:45:59  _____________________________________________________________________  External Attachment:    Type:   Image     Comment:   External Document

## 2010-04-26 NOTE — Assessment & Plan Note (Signed)
Summary: HTN, Anxiety, CP   Vital Signs:  Patient Profile:   50 Years Old Female Height:     68 inches (172.72 cm) Weight:      225.3 pounds BMI:     34.38 Temp:     98.3 degrees F oral Pulse rate:   58 / minute BP sitting:   169 / 104  (left arm) Cuff size:   large  Pt. in pain?   yes    Location:   ha  Vitals Entered By: Garen Grams LPN (February 26, 2008 9:43 AM)                  Visit Type:  office visit PCP:  Lequita Asal  MD  Chief Complaint:  BP, anxiety, and chest pain.  History of Present Illness: 50 y/o female here for f/u:  HTN- BP has been elevated for several months with SBPs >160s, DBPs >100s. taking metoprolol and lisinopril as prescribed. has had headache, chest pain. denies SOB, palpitations, peripheral edema, orthopnea, PND.   Anxiety- off clonazepam for several months. experienced some withdrawal symptoms. ?chest pain related to panic attacks.   Chest pain- reports last episode in June. brought on with exertion, relieved by rest. cardiac cath in 3/09 without significant disease. on metoprolol and pravastatin. not on daily aspirin.     Current Allergies: No known allergies       Physical Exam  General:     Well-developed,well-nourished,in no acute distress; alert,appropriate and cooperative throughout examination Neck:     no JVD Chest Wall:     non tender to palpation Lungs:     Normal respiratory effort, chest expands symmetrically. Lungs are clear to auscultation, no crackles or wheezes. Heart:     Normal rate and regular rhythm. S1 and S2 normal without gallop, murmur, click, rub or other extra sounds. Abdomen:     Bowel sounds positive,abdomen soft and non-tender without masses Pulses:     2+ distal pulses Extremities:     no edema    Impression & Recommendations:  Problem # 1:  HYPERTENSION, BENIGN ESSENTIAL (ICD-401.1) Assessment: Deteriorated will add diuretic in combo pill. given bradycardia, will decrease  metoprolol to 12.5 mg two times a day. f/u in 1 month.   Her updated medication list for this problem includes:    Metoprolol Tartrate 25 Mg Tabs (Metoprolol tartrate) .Marland Kitchen... 1/2 tab by mouth bid    Lisinopril-hydrochlorothiazide 20-25 Mg Tabs (Lisinopril-hydrochlorothiazide) ..... One tab by mouth daily  Orders: [facility]- Est Level  3 ([address])   Problem # 2:  ANXIETY STATE NOS (ICD-300.00) Assessment: Deteriorated resume clonazepam 0.5 mg three times a day as needed. tried on SSRIs in the past per her report and didn't tolerate.   Her updated medication list for this problem includes:    Klonopin 0.5 Mg Tabs (Clonazepam) ..... One tab by mouth three times a day as needed  Orders: [facility]- Est Level  3 ([address])   Problem # 3:  CHEST PAIN (ICD-786.50) Assessment: Comment Only no recent episodes. however, would like patient to f/u with Dr. Reyes Ivan since it has been >6 months and see if he has any further recommendations. appt scheduled for monday 12/7 at 11:40 Orders: [facility]- Est Level  3 ([address])   Complete Medication List: 1)  Ambien 10 Mg Tabs (Zolpidem tartrate) .... Take 1 tablet by mouth at bedtime 2)  Flexeril 10 Mg Tabs (Cyclobenzaprine hcl) .Marland Kitchen.. 1 by mouth daily when back pain 3)  Ibuprofen 800 Mg  Tabs (Ibuprofen) .Marland Kitchen.. 1 by mouth three times a day prn 4)  Protonix 40 Mg Tbec (Pantoprazole sodium) .... One tab by mouth daily 5)  Klonopin 0.5 Mg Tabs (Clonazepam) .... One tab by mouth three times a day as needed 6)  Nitroquick 0.4 Mg Subl (Nitroglycerin) .... One tab sublingual q5 minutes as needed chest pain; may repeat up to 3 doses in 15 minutes 7)  Metoprolol Tartrate 25 Mg Tabs (Metoprolol tartrate) .... 1/2 tab by mouth bid 8)  Lisinopril-hydrochlorothiazide 20-25 Mg Tabs (Lisinopril-hydrochlorothiazide) .... One tab by mouth daily 9)  Diclofenac Sodium 50 Mg Tbec (Diclofenac sodium) .... One tab by mouth q8-q12 hours a day as needed for pain 10)  Pravastatin Sodium 20 Mg Tabs  (Pravastatin sodium) .... One by mouth at bedtime   Patient Instructions: 1)  Follow up with Dr. Lanier Prude in 1 month regarding blood pressure.  2)  It is important that you exercise regularly at least 20 minutes 5 times a week. If you develop chest pain, have severe difficulty breathing, or feel very tired , stop exercising immediately and seek medical attention.   Prescriptions: KLONOPIN 0.5 MG  TABS (CLONAZEPAM) one tab by mouth three times a day as needed  #90 x 5   Entered and Authorized by:   Lequita Asal  MD   Signed by:   Lequita Asal  MD on 02/26/2008   Method used:   Print then Give to Patient   RxID:   [id number] AMBIEN 10 MG TABS (ZOLPIDEM TARTRATE) Take 1 tablet by mouth at bedtime  #30 x 5   Entered and Authorized by:   Lequita Asal  MD   Signed by:   Lequita Asal  MD on 02/26/2008   Method used:   Print then Give to Patient   RxID:   [id number] PRAVASTATIN SODIUM 20 MG  TABS (PRAVASTATIN SODIUM) one by mouth at bedtime  #30 x 5   Entered and Authorized by:   Lequita Asal  MD   Signed by:   Lequita Asal  MD on 02/26/2008   Method used:   Electronically to        [facility] (retail)       [address]       [place], [place]  [address]       Ph: [phone]       Fax: [phone]   RxID:   [id number] DICLOFENAC SODIUM 50 MG TBEC (DICLOFENAC SODIUM) one tab by mouth Q8-Q12 hours a day as needed for pain  #60 x 5   Entered and Authorized by:   Lequita Asal  MD   Signed by:   Lequita Asal  MD on 02/26/2008   Method used:   Electronically to        [facility] (retail)       [address]       [place], [place]  [address]       Ph: [phone]       Fax: [phone]   RxID:   [id number] NITROQUICK 0.4 MG  SUBL (NITROGLYCERIN) one tab sublingual Q5 minutes as needed chest pain; may repeat up to 3 doses in 15 minutes  #30 x 0   Entered and Authorized by:   Lequita Asal  MD   Signed by:   Lequita Asal   MD on 02/26/2008   Method used:   Electronically to        [facility] (retail)       [address]       [place], [place]  [address]       Ph: [phone]       Fax: [phone]   RxID:   [id number] PROTONIX 40 MG  TBEC (PANTOPRAZOLE SODIUM) one tab by mouth daily  #30 x 5   Entered and Authorized by:   Lequita Asal  MD   Signed by:   Lequita Asal  MD on 02/26/2008   Method used:   Electronically to        [facility] (retail)       [address]       [place], [place]  [address]       Ph: [phone]       Fax: [phone]   RxID:   [id number] IBUPROFEN 800 MG  TABS (IBUPROFEN) 1 by mouth three times a day prn  #90 x 5   Entered and Authorized by:   Lequita Asal  MD   Signed by:   Lequita Asal  MD on 02/26/2008   Method used:   Electronically to        [facility] (retail)       [address]       [place], [place]  [address]       Ph: [phone]       Fax: [phone]   RxID:   [id number] FLEXERIL 10 MG  TABS (CYCLOBENZAPRINE HCL) 1 by mouth daily when back pain  #30 x 5   Entered and Authorized by:   Lequita Asal  MD   Signed by:   Lequita Asal  MD on 02/26/2008   Method used:   Electronically to        [facility] (retail)       [address]       [place], [place]  [address]       Ph: [phone]       Fax: [phone]   RxID:   [id number] METOPROLOL TARTRATE 25 MG  TABS (METOPROLOL TARTRATE) 1/2 tab by mouth BID  #30 x 5   Entered and Authorized by:   Lequita Asal  MD   Signed by:   Lequita Asal  MD on 02/26/2008   Method used:   Electronically to        [facility] (retail)       [address]       [place], [place]  [address]       Ph: [phone]       Fax: [phone]   RxID:   [id number] LISINOPRIL-HYDROCHLOROTHIAZIDE 20-25 MG TABS (LISINOPRIL-HYDROCHLOROTHIAZIDE) one tab by mouth daily  #30 x 5   Entered and Authorized by:   Lequita Asal  MD   Signed by:   Lequita Asal  MD on 02/26/2008   Method used:   Electronically to        [facility]* (retail)       [address]       [place], [place]  [address]       Ph: [phone]       Fax: [phone]   RxID:   [id number]  [email or web address]  Appended Document: Orders Update    Clinical Lists Changes  Orders: Added new Referral order of Cardiology Referral (Cardiology) - Signed

## 2010-04-26 NOTE — Progress Notes (Signed)
Summary: triage  Phone Note Call from Patient Call back at Home Phone [phone]   Caller: Patient Summary of Call: having bad back spasms wanting to be seen this afternoon. Initial call taken by: Clydell Hakim,  November 02, 2008 12:11 PM  Follow-up for Phone Call        Returned call, no answer.  LVMM for her to call us back. Follow-up by: Dennison Nancy RN,  November 02, 2008 12:18 PM  Additional Follow-up for Phone Call Additional follow up Details #1::        Has been having back spasms for 2 weeks now and are getting worse.  Has been taking the Flexeril with some relief but is almost out of the medication.  Is asking to be seen.  [place] appt made for 1:30. Additional Follow-up by: Dennison Nancy RN,  November 02, 2008 12:24 PM

## 2010-04-26 NOTE — Progress Notes (Signed)
Summary: triage  Phone Note Call from Patient Call back at Home Phone [phone]   Caller: Patient Summary of Call: pt requesting to be seen today because of their back. Initial call taken by: Clydell Hakim,  March 01, 2009 2:07 PM  Follow-up for Phone Call        states her back is "really bothering me" work in with pcp today Follow-up by: Golden Circle RN,  March 01, 2009 2:13 PM

## 2010-04-26 NOTE — Progress Notes (Signed)
  Phone Note Outgoing Call   Call placed by: Lequita Asal  MD,  Aug 05, 2009 8:57 PM Summary of Call: please inform all recent labs unremarkable. cholesterol still high patient is aware of this from previous Initial call taken by: Lequita Asal  MD,  Aug 05, 2009 8:58 PM  Follow-up for Phone Call        spoke with pt concerning labs Follow-up by: Loralee Pacas CMA,  Aug 07, 2009 8:57 AM

## 2010-04-26 NOTE — Letter (Signed)
Summary: Out of Work  [facility]  [address]   [place], [place] [address]   Phone: [phone]  Fax: [phone]    May 27, 2007   Employee:  ICEL CASTLES    To Whom It May Concern:   For Medical reasons and recent hospitalization, please excuse the above named employee from work for the following dates:  Start:   Tuesday May 26, 2007  End:   Monday June 01, 2007  If you need additional information, please feel free to contact our office.         Sincerely,    Lequita Asal  MD

## 2010-04-26 NOTE — Miscellaneous (Signed)
Summary: Endometrial Biopsy  Consent:  Endometrial Biopsy   Imported By: Haydee Salter 05/18/2008 13:46:50  _____________________________________________________________________  External Attachment:    Type:   Image     Comment:   External Document

## 2010-04-26 NOTE — Assessment & Plan Note (Signed)
Summary: flu-like illness   Vital Signs:  Patient profile:   50 year old female Weight:      218.4 pounds BMI:     33.33 Temp:     98.9 degrees F oral Pulse rate:   78 / minute Pulse rhythm:   regular BP sitting:   143 / 97  (right arm) Cuff size:   large  Vitals Entered By: Loralee Pacas CMA (May 22, 2009 3:13 PM) CC: cold symptoms Comments cold x 1 week   Primary Care Provider:  Lequita Asal  MD  CC:  cold symptoms.  History of Present Illness: 50yo F here w/ flu-like symptoms  Flu-like symptoms- x 1 wk.  Worsening course.  Cough (productive), body aches, chills, N/V/diarrhea.  No fevers.  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  Taking ibuprofen, nyquil, robitussin.    Current Medications (verified): 1)  Ambien 10 Mg Tabs (Zolpidem Tartrate) .... Take 1 Tablet By Mouth At Bedtime 2)  Flexeril 10 Mg  Tabs (Cyclobenzaprine Hcl) .... One By Mouth Three Times A Day As Needed Back Pain 3)  Ibuprofen 800 Mg  Tabs (Ibuprofen) .Marland Kitchen.. 1 By Mouth Three Times A Day Prn 4)  Protonix 40 Mg  Tbec (Pantoprazole Sodium) .... One Tab By Mouth Daily 5)  Klonopin 0.5 Mg  Tabs (Clonazepam) .... One Tab By Mouth Three Times A Day As Needed 6)  Nitroquick 0.4 Mg  Subl (Nitroglycerin) .... One Tab Sublingual Q5 Minutes As Needed Chest Pain; May Repeat Up To 3 Doses in 15 Minutes 7)  Metoprolol Tartrate 25 Mg  Tabs (Metoprolol Tartrate) .Marland Kitchen.. 1 Tab By Mouth Two Times A Day 8)  Lisinopril-Hydrochlorothiazide 20-12.5 Mg Tabs (Lisinopril-Hydrochlorothiazide) .... One Tab By Mouth Daily 9)  Amlodipine Besylate 5 Mg Tabs (Amlodipine Besylate) .... One Tab By Mouth Daily  Allergies (verified): No Known Drug Allergies  Review of Systems        Cough (productive), body aches, chills, N/V/diarrhea.  No fevers.    Physical Exam  General:  VS Reviewed. Well appearing, NAD.  Head:  no sinus ttp Eyes:  no injected conjunctiva Mouth:  dry mucosa no erythema, edema, or exudative  tonsils Neck:  supple, full ROM, no goiter or mass  Lungs:  Normal respiratory effort, chest expands symmetrically. Lungs are clear to auscultation, no crackles or wheezes. Heart:  Normal rate and regular rhythm. S1 and S2 normal without gallop, murmur, click, rub or other extra sounds. Abdomen:  Soft, NT, ND, no HSM, active BS  Skin:  multiple moles on the face nl color and turgor Cervical Nodes:  mild shotty lymph nodes   Impression & Recommendations:  Problem # 1:  VIRAL INFECTION (ICD-079.99) Assessment New  Hx and symptoms c/w acute flu-like illness. Not toxic appearing and not meeting hospital criteria. Supportive care.  Cough suppressants (mucinex DM and tessalon perles), ibuprofen for body aches, and zofran for nausea. Will f/u as needed if symptoms not improving in the next week.  Her updated medication list for this problem includes:    Ibuprofen 800 Mg Tabs (Ibuprofen) .Marland Kitchen... 1 by mouth three times a day prn    Tessalon 200 Mg Caps (Benzonatate) .Marland Kitchen... 1 tab by mouth three times a day as needed for cough  Orders: [facility]- Est Level  3 ([address])  Complete Medication List: 1)  Ambien 10 Mg Tabs (Zolpidem tartrate) .... Take 1 tablet by mouth at bedtime 2)  Flexeril 10 Mg Tabs (Cyclobenzaprine hcl) .... One by mouth three times  a day as needed back pain 3)  Ibuprofen 800 Mg Tabs (Ibuprofen) .Marland Kitchen.. 1 by mouth three times a day prn 4)  Protonix 40 Mg Tbec (Pantoprazole sodium) .... One tab by mouth daily 5)  Klonopin 0.5 Mg Tabs (Clonazepam) .... One tab by mouth three times a day as needed 6)  Nitroquick 0.4 Mg Subl (Nitroglycerin) .... One tab sublingual q5 minutes as needed chest pain; may repeat up to 3 doses in 15 minutes 7)  Metoprolol Tartrate 25 Mg Tabs (Metoprolol tartrate) .Marland Kitchen.. 1 tab by mouth two times a day 8)  Lisinopril-hydrochlorothiazide 20-12.5 Mg Tabs (Lisinopril-hydrochlorothiazide) .... One tab by mouth daily 9)  Amlodipine Besylate 5 Mg Tabs (Amlodipine besylate)  .... One tab by mouth daily 10)  Tessalon 200 Mg Caps (Benzonatate) .Marland Kitchen.. 1 tab by mouth three times a day as needed for cough 11)  Zofran Odt 4 Mg Tbdp (Ondansetron) .Marland Kitchen.. 1 tab to dissolve on the tongue every 6 hours as needed for nausea and vomiting  Patient Instructions: 1)  Call me or return in 1 week if symptoms aren't improving. 2)  Try the mucinex DM first for the cough. 3)  You can take ibuprofen up to 800mg  every 8 hours for body aches. Prescriptions: ZOFRAN ODT 4 MG TBDP (ONDANSETRON) 1 tab to dissolve on the tongue every 6 hours as needed for nausea and vomiting  #5 x 0   Entered and Authorized by:   Marisue Ivan  MD   Signed by:   Marisue Ivan  MD on 05/22/2009   Method used:   Electronically to        [facility] (retail)       [address]       [place], [place]  [address]       Ph: [phone]       Fax: [phone]   RxID:   [id number] TESSALON 200 MG CAPS (BENZONATATE) 1 tab by mouth three times a day as needed for cough  #21 x 0   Entered and Authorized by:   Marisue Ivan  MD   Signed by:   Marisue Ivan  MD on 05/22/2009   Method used:   Electronically to        [facility] (retail)       [address]       [place], [place]  [address]       Ph: [phone]       Fax: [phone]   RxID:   [id number]

## 2010-04-26 NOTE — Assessment & Plan Note (Signed)
Summary: f/u ED,df   Vital Signs:  Patient profile:   50 year old female Height:      68 inches Weight:      220 pounds BMI:     33.57 BSA:     2.13 Temp:     98.7 degrees F Pulse rate:   118 / minute BP sitting:   134 / 86  Vitals Entered By: Jone Baseman CMA (September 12, 2009 3:10 PM) CC: f/u ED Is Patient Diabetic? No Pain Assessment Patient in pain? yes     Location: stomach Intensity: 7   Primary Care Provider:  Lequita Asal  MD  CC:  f/u ED.  History of Present Illness: abdominal pain- evaluated in ED on Sunday. found to have gallbladder sludge. recommended f/u with surgery, but patient would like to wait since still dealing with issues with her spine. is trying dietary modifications. N/V/pain have improved. no fever. increasing protonix to two times a day recommended by ED physician.   anxiety- on clonazepam. with back and abdominal pain, feels anxiety issues are worsening. would like increase of clonazepam to 1 mg by mouth three times a day.   elevated BP/HR- has been high for last several weeks. was >200 systolic at appointment yesterday. denies chest pain, SOB, headaches, blurred vision, peripheral edema.   Habits & Providers  Alcohol-Tobacco-Diet     Tobacco Status: never  Current Medications (verified): 1)  Flexeril 10 Mg  Tabs (Cyclobenzaprine Hcl) .... One By Mouth Three Times A Day As Needed Back Pain 2)  Ibuprofen 800 Mg  Tabs (Ibuprofen) .Marland Kitchen.. 1 By Mouth Three Times A Day Prn 3)  Protonix 40 Mg  Tbec (Pantoprazole Sodium) .... One Tab By Mouth Daily 4)  Metoprolol Tartrate 25 Mg  Tabs (Metoprolol Tartrate) .Marland Kitchen.. 1 Tab By Mouth Two Times A Day 5)  Lisinopril-Hydrochlorothiazide 20-12.5 Mg Tabs (Lisinopril-Hydrochlorothiazide) .... One Tab By Mouth Daily 6)  Amlodipine Besylate 5 Mg Tabs (Amlodipine Besylate) .... One Tab By Mouth Daily 7)  Loratadine 10 Mg Tabs (Loratadine) .... One Tab By Mouth Daily 8)  Patanol 0.1 % Soln (Olopatadine Hcl) .... One  Drop in Affected Eye Twice A Day. Disp 5 Ml.  Allergies (verified): No Known Drug Allergies  Social History: Smoking Status:  never  Physical Exam  General:  VS Reviewed. Well appearing, NAD. appears slightly uncomfortable  Mouth:  MMM Abdomen:  soft, mild TTP of RUQ. +BS Extremities:  no peripheral edema of BLE   Impression & Recommendations:  Problem # 1:  CHOLELITHIASIS (ICD-574.20) Assessment Unchanged patient uninterested in surgical consultation right now because of back issues. advised to avoid fried and fatty foods.   Orders: [facility]- Est Level  3 ([address])  Problem # 2:  HYPERTENSION, BENIGN ESSENTIAL (ICD-401.1) Assessment: Deteriorated will increase metoprolol. f/u in 1 month.  Her updated medication list for this problem includes:    Metoprolol Tartrate 50 Mg Tabs (Metoprolol tartrate) .Marland Kitchen... Take 1 tab by mouth two times per day    Lisinopril-hydrochlorothiazide 20-12.5 Mg Tabs (Lisinopril-hydrochlorothiazide) ..... One tab by mouth daily    Amlodipine Besylate 5 Mg Tabs (Amlodipine besylate) ..... One tab by mouth daily  Orders: [facility]- Est Level  3 ([address])  Problem # 3:  ANXIETY STATE NOS (ICD-300.00) Assessment: Deteriorated worsening given multiple medical issues. discussed role of SSRIs as initial therapy. patient reiterated that she has tried SSRIs in past  and they made her "jittery." have increased clonazepam. advised patient that continuation will be at discretion of new  PCP.   Her updated medication list for this problem includes:    Clonazepam 1 Mg Tabs (Clonazepam) ..... One tab by mouth three times a day as needed for anxiety  Orders: [facility]- Est Level  3 ([address])  Patient Instructions: 1)  Follow up in 1 month about blood pressure with Dr. Tye Savoy.  2)  Increase METOPROLOL to TWO TABLETS twice a day until you finish, then pick up new prescription for 50 mg tablets (one tablet twice a day) Prescriptions: PROTONIX 40 MG  TBEC (PANTOPRAZOLE SODIUM) one  tab by mouth two times a day  #180 x 1   Entered and Authorized by:   Lequita Asal  MD   Signed by:   Lequita Asal  MD on 09/12/2009   Method used:   Electronically to        [id number] (retail)       [address]       [place], [place]  [address]       Ph: [phone]       Fax: [phone]   RxID:   [id number] METOPROLOL TARTRATE 50 MG TABS (METOPROLOL TARTRATE) Take 1 tab by mouth two times per day  #180 x 1   Entered and Authorized by:   Lequita Asal  MD   Signed by:   Lequita Asal  MD on 09/12/2009   Method used:   Electronically to        [facility] #[id number]* (retail)       [address]       [place], [place]  [address]       Ph: [phone]       Fax: [phone]   RxID:   [id number] FLEXERIL 10 MG  TABS (CYCLOBENZAPRINE HCL) one by mouth three times a day as needed back pain  #90 x 3   Entered and Authorized by:   Lequita Asal  MD   Signed by:   Lequita Asal  MD on 09/12/2009   Method used:   Electronically to        [facility] #[id number]* (retail)       [address]       [place], [place]  [address]       Ph: [phone]       Fax: [phone]   RxID:   [id number] CLONAZEPAM 1 MG TABS (CLONAZEPAM) one tab by mouth three times a day as needed for anxiety  #90 x 3   Entered and Authorized by:   Lequita Asal  MD   Signed by:   Lequita Asal  MD on 09/12/2009   Method used:   Print then Give to Patient   RxID:   [id number] METOPROLOL TARTRATE 50 MG TABS (METOPROLOL TARTRATE) Take 1 tab by mouth two times per day  #60 x 5   Entered and Authorized by:   Lequita Asal  MD   Signed by:   Lequita Asal  MD on 09/12/2009   Method used:   Electronically to        [facility] #[id number]* (retail)       [address]       [place], [place]  [address]       Ph: [phone]       Fax: [phone]   RxID:   [id number] PROTONIX 40 MG  TBEC (PANTOPRAZOLE SODIUM) one tab by mouth two  times a day  #60 x 3   Entered and Authorized  by:   Lequita Asal  MD   Signed by:   Lequita Asal  MD on 09/12/2009   Method used:   Electronically to        [facility] [phone]* (retail)       [address]       [place], [place]  [address]       Ph: [phone]       Fax: [phone]   RxID:   [id number]

## 2010-04-26 NOTE — Miscellaneous (Signed)
Summary: error  [facility]  [address]   [place], [place] [address]   Phone: [phone]  Fax: [phone]    Jul 27, 2009   Employee:  LAIYLA SLAGEL    To Whom It May Concern:   For Medical reasons, please excuse the above named employee from work for the following dates:  Start:   Jul 27, 2009  End:   Jul 28, 2009  If you need additional information, please feel free to contact our office.         Sincerely,    Lequita Asal  MD

## 2010-04-26 NOTE — Assessment & Plan Note (Signed)
Summary: fu meds wk  Vital Signs:  Patient Profile:   50 Years Old Female Height:     68.5 inches Weight:      223.4 pounds Temp:     99.8 degrees F Pulse rate:   78 / minute BP sitting:   125 / 91  Pt. in pain?   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 Her issues discussed today are as follow:  1) Anxiety: Reports feeling stressed and anxious at work. 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 And the remaining staff is being overworked. She will like to take xanaxl to help her manage her anxiety while she is at work. She is not sleeping well thinking about her next day at work. She works second shift and ussually goes to bed at 11:pm has trouble getting to sleep but ussually does not wake up in the middle. Living with her new female partner for the last year. Think their relation is stable. Her partner is recovering from depression.  2) Migraines: Is having frequent mild headaches but has just had 2 episodes of bad Migraine in over one month. Imitrex and metochlopramide helps resolve her bad migraines. She still takes Ibuprofen for mild headaches depite our discussion about rebound headaches related to OTC pain meds.  3)Obesity: Wt today is 223.4lbs her BMI is 33.5. Walking 1 mile evry other day. Skips meals with frequency. "No following  healthy diet"  4) NFA:OZHYQMV being compliant with lopressor/hctz 100/50mg  and Norvasc 5mg . Cheks BP at home ussually in 120/80s but BP at work ussually 130-140s/90-100s. Here today is 125/91. Denies chest pain, SOB, PND or leg  sweeling, no visual changes (has been using reading glasses just for reading for about a year.  5) h/o hipercholesterolemia: Not on statins her LDL was 123 in 3/07.  6) Health maintenace: Normal pap smears in past due for this year. Denies vaginal sex. Consider HPV testing next time to change schedule to Q3 years as she is low risk. Not taking calcium or vitamin D. Needs meds refills.              Physical Exam  General:     Well-developed, mildly Obese female, in no acute distress; alert,appropriate and cooperative throughout examination Eyes:     EOMI. Perrla. limited funduscopic exam benign, without hemorrhages, exudates or papilledema. Vision grossly normal. Neck:     No deformities, masses, or tenderness noted. No JVD. No thyromegally Lungs:     Normal respiratory effort, chest expands symmetrically. Lungs are clear to auscultation, no crackles or wheezes. Heart:     Normal rate and regular rhythm. S1 and S2 normal without gallop, murmur, click, rub or other extra sounds. Extremities:     No LEE Neurologic:     alert & oriented X3, strength normal in all extremities, sensation intact to light touch, gait normal, and DTRs symmetrical and normal.  Normal visual fields compared with mine.    Impression & Recommendations:  Problem # 1:  HYPERTENSION, BENIGN ESSENTIAL (ICD-401.1) Assessment: Unchanged Improved but not  at goal. Increased Norvasc to 10mg  daily. Continue Lopressor/hctz 100/50mg . Will check electrolites and creatinine.  Orders: [facility]- Est  Level 4 ([address])  Future Orders: Basic Met-[facility] ([id number]) ... 09/22/2006   Problem # 2:  HYPERCHOLESTEROLEMIA (ICD-272.0) Assessment: Comment Only Will recheck lipid panel.  Orders: [facility]- Est  Level 4 ([address])  Future Orders: Lipid-[facility] ([id number]) ... 09/22/2006   Problem # 3:  OBESITY NOS (ICD-278.00) Assessment: Unchanged Over counseling about weight management (see written reccomendations for  summary)  Orders: [facility]- Est  Level 4 ([address])   Problem # 4:  COMMON MIGRAINE (ICD-346.10) Assessment: Improved Doing well w/o prophilaxis. Will continue to trerat headache to headache. Continue metoclopramide and imitrex. Encouraged to avoid frequent tylenol or Ibuprofen for headaches.  Orders: [facility]- Est  Level 4 ([address])   Problem # 5:  DISORDER, ADJUSTMENT W/ANXIETY (ICD-309.24) Assessment: New Adjusting to new dynamics and stress at work. Will avoid short acting benzos as they are more prone frequent use and to create dependence. Decided to prescribe low dose Klonopin 0.5mg  three times a day as needed. Encouraged daily exercise routine, scheduled breaks at work if possible. Consider performin an anxiety questionaire and an SSRI if persistent symptoms.   Patient Instructions: 1)  You need to lose weight. Consider a lower calorie diet and regular exercise.  2)  It is important that you exercise regularly at least 30-60 minutes 5 times a week. If you develop chest pain, have severe difficulty breathing, or feel very tired , stop exercising immediately and seek medical attention. 3)  Some of the diet reccomendations we discussed today: 4)  -Do not skip meals 5)  -Eat healthy snaks. 6)  -Cut on sugar by using splenda or sweetner and drink green te. 7)  -Plate distribution as we discussed: 1/2 you green vegetables, 1/4 protein (meat, beans, eggsetc) 1/4 carbs (corn, potato, pasta, bread) 8)  -Flex seed roasted and grinded put a table spoon on fat free yougurt as breakfast for example) 9)  Increase you norvasc to 10mg  daily. I will be sending your prescriptions electronically to your pharmacy today. 10)  Return for fasting labs at your earliest convenient an order will be in the lab. 11)  Please schedule a follow-up appointment in 2-3 months. You will meet your new doctor. I has been a pleasure to be your doctor!

## 2010-04-26 NOTE — Assessment & Plan Note (Signed)
Summary: FU/KH   Vital Signs:  Patient profile:   50 year old female Weight:      224.1 pounds Temp:     98.5 degrees F oral Pulse rate:   76 / minute Pulse rhythm:   regular BP sitting:   135 / 90  (left arm) Cuff size:   large  Vitals Entered By: Loralee Pacas CMA (Aug 04, 2009 11:31 AM) CC: follow-up visit Comments right side still feels funny   Primary Care Provider:  Lequita Asal  MD  CC:  follow-up visit.  History of Present Illness: seen in ED 5/10 for R sided weakness, near syncope, N/V. states she is tolerating by mouth. still doesnt feel "normal." states sxs similar to when she had brain tumor. has appt with neurosurgery scheduled for 5/16. no further emesis. no slurred speech, weakness, focal neuro deficits.   Current Medications (verified): 1)  Ambien 10 Mg Tabs (Zolpidem Tartrate) .... Take 1 Tablet By Mouth At Bedtime 2)  Flexeril 10 Mg  Tabs (Cyclobenzaprine Hcl) .... One By Mouth Three Times A Day As Needed Back Pain 3)  Ibuprofen 800 Mg  Tabs (Ibuprofen) .Marland Kitchen.. 1 By Mouth Three Times A Day Prn 4)  Protonix 40 Mg  Tbec (Pantoprazole Sodium) .... One Tab By Mouth Daily 5)  Klonopin 0.5 Mg  Tabs (Clonazepam) .... One Tab By Mouth Three Times A Day As Needed 6)  Nitroquick 0.4 Mg  Subl (Nitroglycerin) .... One Tab Sublingual Q5 Minutes As Needed Chest Pain; May Repeat Up To 3 Doses in 15 Minutes 7)  Metoprolol Tartrate 25 Mg  Tabs (Metoprolol Tartrate) .Marland Kitchen.. 1 Tab By Mouth Two Times A Day 8)  Lisinopril-Hydrochlorothiazide 20-12.5 Mg Tabs (Lisinopril-Hydrochlorothiazide) .... One Tab By Mouth Daily 9)  Amlodipine Besylate 5 Mg Tabs (Amlodipine Besylate) .... One Tab By Mouth Daily 10)  Hydrocodone-Acetaminophen 5-500 Mg Tabs (Hydrocodone-Acetaminophen) .... One Tab By Mouth Q4-Q6 Hours As Needed For Pain. 11)  Loratadine 10 Mg Tabs (Loratadine) .... One Tab By Mouth Daily 12)  Patanol 0.1 % Soln (Olopatadine Hcl) .... One Drop in Affected Eye Twice A Day. Disp 5  Ml.  Allergies (verified): No Known Drug Allergies  Physical Exam  General:  VS Reviewed. Well appearing, NAD. appears fatigued  Mouth:  MMM Lungs:  Normal respiratory effort, chest expands symmetrically. Lungs are clear to auscultation, no crackles or wheezes. Heart:  Normal rate and regular rhythm. S1 and S2 normal without gallop, murmur, click, rub or other extra sounds. Abdomen:  Soft, NT, ND, active BS  Neurologic:  alert & oriented X3, cranial nerves II-XII intact, strength normal in all extremities, and gait normal.     Impression & Recommendations:  Problem # 1:  NAUSEA AND VOMITING (ICD-787.01) Assessment Unchanged  etiology unclear. normal abdominal CT in ED. normal labs. nonfocal neuro exam. f/u with neurosurgery on monday as scheduled. red flags to prompt eval sooner given. will check additional labs (TSH, CBC, CMP, and lipids).   Orders: [facility]- Est  Level 4 ([address])  Other Orders: Comp Met-[facility] ([id number]) Lipid-[facility] ([id number]) TSH-[facility] ([id number]) CBC-[facility] (84[address])  Patient Instructions: 1)  If you develop any slurred speech, one sided weakness, or any other concerning symptoms, you need to be seen right away

## 2010-04-26 NOTE — Progress Notes (Signed)
Summary: triage  Phone Note Call from Patient Call back at Home Phone [phone]   Caller: Patient Summary of Call: cold/congestion/fever - wants to come in today Initial call taken by: De Nurse,  May 22, 2009 10:44 AM  Follow-up for Phone Call        she will be here at 3 for a wi slot. states otcs are not helping. she does not smoke. Follow-up by: Golden Circle RN,  May 22, 2009 11:10 AM

## 2010-04-26 NOTE — Letter (Signed)
Summary: Out of Work  [facility]  [address]   [place], [place] [address]   Phone: [phone]  Fax: [phone]    Aug 04, 2009   Employee:  KASSIA DEMARINIS    To Whom It May Concern:   For Medical reasons, please excuse the above named employee from work for the following dates:  Start:   Friday Aug 04, 2009  End:   Monday Aug 07, 2009  If you need additional information, please feel free to contact our office.         Sincerely,    Loralee Pacas CMA

## 2010-04-26 NOTE — Consult Note (Signed)
Summary: [facility]  [facility]   Imported By: [name] 09/16/2009 13:32:36  _____________________________________________________________________  External Attachment:    Type:   Image     Comment:   External Document

## 2010-04-26 NOTE — Consult Note (Signed)
Summary: [facility]  [facility]   Imported By: Clydell Hakim 05/04/2009 15:40:11  _____________________________________________________________________  External Attachment:    Type:   Image     Comment:   External Document

## 2010-04-26 NOTE — Miscellaneous (Signed)
Summary: Medical Consultation Form  Form dropped off for patient to be seen at [facility].  Please fax form when completed. [name]  January 23, 2010 1:31 PM  Pt needs appt first to f/u HTN and meet Dr. Tye Savoy.  Appt. Scheduled 02/02/10 @ 3:30pm.  Pt. notified. Will have MD sign Medical Consult form at office visit.  ..Marland KitchenMarland KitchenTerese Door  January 23, 2010 2:28 PM

## 2010-04-26 NOTE — Assessment & Plan Note (Signed)
Summary: back pain,tcb   Vital Signs:  Patient profile:   50 year old female Weight:      224.4 pounds Temp:     98.3 degrees F oral Pulse rate:   78 / minute Pulse rhythm:   regular BP sitting:   121 / 86  (left arm) Cuff size:   large  Vitals Entered By: Loralee Pacas CMA (Jul 27, 2009 11:49 AM) CC: back pain Pain Assessment Patient in pain? yes     Location: lower back Intensity: 8 Comments leaning and could not straighten up, laid around tuesday, got up some wednesday and has been taking motrin and flexiril for pain  Vision Screening:Left eye w/o correction: 20 / 30 Right Eye w/o correction: 20 / 25 Both eyes w/o correction:  20/ 30        Vision Entered By: Loralee Pacas CMA (Jul 27, 2009 12:04 PM)   Primary Care Provider:  Lequita Asal  MD  CC:  back pain.  History of Present Illness: back pain- History of 2 disc surgeries in 1987.  Has chronic low back pain.  Treats with heating pad, ibuprofen, and flexeril., ice. had exacerbations in october and december 2010 with radicular symptoms which resolved after steroid burst. recent worsening started last several days. 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 no numbness/tingling/paresthesias, no loss of bowel or bladder continence. worsened with sitting, standing, or lying down.  No groin numbness or tingling.  No weakness.  No fevers.  unable to work last 2 days because pain was so uncomfortable.   L eyelid swelling- alternates between lids. occasionally on R as well. +pain, redness. no fevers, chills. h/o seasonal allergies.   Current Medications (verified): 1)  Ambien 10 Mg Tabs (Zolpidem Tartrate) .... Take 1 Tablet By Mouth At Bedtime 2)  Flexeril 10 Mg  Tabs (Cyclobenzaprine Hcl) .... One By Mouth Three Times A Day As Needed Back Pain 3)  Ibuprofen 800 Mg  Tabs (Ibuprofen) .Marland Kitchen.. 1 By Mouth Three Times A Day Prn 4)  Protonix 40 Mg  Tbec (Pantoprazole Sodium)  .... One Tab By Mouth Daily 5)  Klonopin 0.5 Mg  Tabs (Clonazepam) .... One Tab By Mouth Three Times A Day As Needed 6)  Nitroquick 0.4 Mg  Subl (Nitroglycerin) .... One Tab Sublingual Q5 Minutes As Needed Chest Pain; May Repeat Up To 3 Doses in 15 Minutes 7)  Metoprolol Tartrate 25 Mg  Tabs (Metoprolol Tartrate) .Marland Kitchen.. 1 Tab By Mouth Two Times A Day 8)  Lisinopril-Hydrochlorothiazide 20-12.5 Mg Tabs (Lisinopril-Hydrochlorothiazide) .... One Tab By Mouth Daily 9)  Amlodipine Besylate 5 Mg Tabs (Amlodipine Besylate) .... One Tab By Mouth Daily 10)  Hydrocodone-Acetaminophen 5-500 Mg Tabs (Hydrocodone-Acetaminophen) .... One Tab By Mouth Q4-Q6 Hours As Needed For Pain. 11)  Prednisone 50 Mg Tabs (Prednisone) .... One Tab By Mouth Daily X7 Days. 12)  Loratadine 10 Mg Tabs (Loratadine) .... One Tab By Mouth Daily 13)  Patanol 0.1 % Soln (Olopatadine Hcl) .... One Drop in Affected Eye Twice A Day. Disp 5 Ml.  Allergies (verified): No Known Drug Allergies  Physical Exam  General:  VS Reviewed. Well appearing, NAD. appears in some pain  Eyes:  L upper eyelid with pain/swelling. EOMI, PERRLA. vision grossly normal   Impression & Recommendations:  Problem # 1:  BACK PAIN, LUMBAR (ICD-724.2) Assessment Deteriorated  rx for vicodin and prednisone. appt with neurosurgeon in June.   Her updated medication list for  this problem includes:    Flexeril 10 Mg Tabs (Cyclobenzaprine hcl) ..... One by mouth three times a day as needed back pain    Ibuprofen 800 Mg Tabs (Ibuprofen) .Marland Kitchen... 1 by mouth three times a day prn    Hydrocodone-acetaminophen 5-500 Mg Tabs (Hydrocodone-acetaminophen) ..... One tab by mouth q4-q6 hours as needed for pain.  Orders: [facility]- Est Level  3 (16109)  Problem # 2:  EDEMA OF EYELID (ICD-374.82) Assessment: New likely 2/2 allergies. will treat with patanol and loratadine. very low likelihood of infectious process.   Orders: [facility] [phone][facility]- Est Level  3  (09811) Prescriptions: PATANOL 0.1 % SOLN (OLOPATADINE HCL) one drop in affected eye twice a day. disp 5 ml.  #1 x 2   Entered and Authorized by:   Lequita Asal  MD   Signed by:   Lequita Asal  MD on 07/27/2009   Method used:   Electronically to        [facility] (retail)       [address]       [place], [place]  [address]       Ph: [phone]       Fax: [phone]   RxID:   [id number] LORATADINE 10 MG TABS (LORATADINE) one tab by mouth daily  #30 x 3   Entered and Authorized by:   Lequita Asal  MD   Signed by:   Lequita Asal  MD on 07/27/2009   Method used:   Electronically to        [facility] (retail)       [address]       [place], [place]  [address]       Ph: [phone]       Fax: [phone]   RxID:   [id number] HYDROCODONE-ACETAMINOPHEN 5-500 MG TABS (HYDROCODONE-ACETAMINOPHEN) one tab by mouth q4-q6 hours as needed for pain.  #90 x 1   Entered and Authorized by:   Lequita Asal  MD   Signed by:   Lequita Asal  MD on 07/27/2009   Method used:   Print then Give to Patient   RxID:   [id number] PREDNISONE 50 MG TABS (PREDNISONE) one tab by mouth daily x7 days.  #7 x 0   Entered and Authorized by:   Lequita Asal  MD   Signed by:   Lequita Asal  MD on 07/27/2009   Method used:   Electronically to        [facility] (retail)       [address]       [place], [place]  [address]       Ph: [phone]       Fax: [phone]   RxID:   [id number]

## 2010-04-26 NOTE — Progress Notes (Signed)
Summary: refill  Phone Note Refill Request Call back at Home Phone [phone] Message from:  Patient  Refills Requested: Medication #1:  PROTONIX 40 MG  TBEC one tab by mouth two times a day   Notes: [facility]  Initial call taken by: De Nurse,  September 14, 2009 12:24 PM  Follow-up for Phone Call        rx sent 6/21 but [facility]. resent Follow-up by: Lequita Asal  MD,  September 14, 2009 1:51 PM  Additional Follow-up for Phone Call Additional follow up Details #1::        please see rxs which follow in next document Additional Follow-up by: Lequita Asal  MD,  September 14, 2009 5:02 PM    Additional Follow-up for Phone Call Additional follow up Details #2::    pt notified, voiced understanding Follow-up by: Gladstone Pih,  September 14, 2009 5:05 PM

## 2010-04-26 NOTE — Progress Notes (Signed)
Summary: feeling poorly.  Phone Note Call from Patient   Summary of Call: pt feeling sick, BP 179/117.  Feeling nauseated, diaphoretic.  x 1 day, diarrhea.  Lives with girlfriend.  + abd pain and side pain.  No chest pain.  Not keeping oral food.  advised to go to [facility]  pt agrees. Initial call taken by: Eustaquio Boyden  MD,  Jul 31, 2009 7:27 PM

## 2010-04-26 NOTE — Miscellaneous (Signed)
Summary: problem list  Clinical Lists Changes  Problems: Removed problem of COMMON MIGRAINE (ICD-346.10) - Signed

## 2010-04-26 NOTE — Progress Notes (Signed)
Summary: triage  Phone Note Call from Patient Call back at Home Phone [phone]   Caller: Patient Summary of Call: is having severe abd pain and needs to talk to nurse or dr. Initial call taken by: De Nurse,  April 27, 2008 10:02 AM  Follow-up for Phone Call        [facility] [address] states she is in bad pain since she woke up this am. the toradol shot worked well & she slept all night. unable to go to work this am due to severity of pain. wants pain med (taking 800 ibu now with little relief) and note for work Follow-up by: Golden Circle RN,  April 27, 2008 10:04 AM  Additional Follow-up for Phone Call Additional follow up Details #1::        told her pcp had not responded yet. will take to a preceptor Additional Follow-up by: Golden Circle RN,  April 27, 2008 2:16 PM    Additional Follow-up for Phone Call Additional follow up Details #2::    Dr Jennette Kettle ok'd vicodin 5/500  2 by mouth every 4-6 hrs #40 with one refills. called to [facility] [address]. ok to write note out of work for today & tomorrow. will need to be seen if needing to be out Friday & still in pain. states she is not bleeding very much, just pain Follow-up by: Golden Circle RN,  April 27, 2008 2:22 PM  Pt requesting another callback from Triage.   BARBARA MCGREGOR  April 27, 2008 2:11 PM

## 2010-04-26 NOTE — Letter (Signed)
Summary: Out of Work  [facility]  [address]   [place], [place] [address]   Phone: [phone]  Fax: [phone]    Aug 04, 2009   Employee:  ATHENIA RYS    To Whom It May Concern:   For Medical reasons, please excuse the above named employee from work for the following dates:  Start:   Friday Aug 04, 2009  End:   Monday Aug 07, 2009  If you need additional information, please feel free to contact our office.         Sincerely,    Lequita Asal, MD

## 2010-05-14 ENCOUNTER — Other Ambulatory Visit: Payer: Self-pay | Admitting: Family Medicine

## 2010-05-14 MED ORDER — CLONAZEPAM 1 MG PO TABS: 1.0000 mg | ORAL_TABLET | Freq: Two times a day (BID) | ORAL | Status: DC | PRN

## 2010-05-24 ENCOUNTER — Encounter: Payer: Self-pay | Admitting: Family Medicine

## 2010-05-24 ENCOUNTER — Ambulatory Visit (INDEPENDENT_AMBULATORY_CARE_PROVIDER_SITE_OTHER): Payer: Self-pay | Admitting: Family Medicine

## 2010-05-24 DIAGNOSIS — J329 Chronic sinusitis, unspecified: Secondary | ICD-10-CM | POA: Insufficient documentation

## 2010-05-24 DIAGNOSIS — R05 Cough: Secondary | ICD-10-CM

## 2010-05-24 DIAGNOSIS — R059 Cough, unspecified: Secondary | ICD-10-CM | POA: Insufficient documentation

## 2010-05-24 MED ORDER — AZITHROMYCIN 250 MG PO TABS: ORAL_TABLET | ORAL | Status: AC

## 2010-05-24 MED ORDER — CHLORPHENIRAMINE-HYDROCODONE 8-10 MG/5ML PO LQCR: 5.0000 mL | Freq: Two times a day (BID) | ORAL | Status: DC | PRN

## 2010-05-24 NOTE — Assessment & Plan Note (Signed)
+   maxillary and frontal tenderness.  No evidence of serious pathology.  Treat with Z-pak.  Precautions given.

## 2010-05-24 NOTE — Patient Instructions (Signed)

## 2010-05-24 NOTE — Progress Notes (Signed)
  Subjective:    Patient ID: Shannon Stuart, female    DOB: Apr 24, 1960, 50 y.o.   MRN: [id number]  URI  This is a new problem. The current episode started 1 to 4 weeks ago. The problem has been gradually worsening. The maximum temperature recorded prior to her arrival was 100 - 100.9 F. The fever has been present for 5 days or more. Associated symptoms include congestion, coughing, headaches, a plugged ear sensation, sinus pain and a sore throat. Pertinent negatives include no abdominal pain, chest pain, nausea, neck pain or vomiting. She has tried acetaminophen, antihistamine, decongestant, increased fluids and sleep for the symptoms. The treatment provided no relief.  Sinusitis This is a new problem. The current episode started 1 to 4 weeks ago. The problem has been gradually worsening since onset. The maximum temperature recorded prior to her arrival was 100 - 100.9 F. The fever has been present for 5 days or more. Her pain is at a severity of 4/10. The pain is moderate. Associated symptoms include congestion, coughing, headaches and a sore throat. Pertinent negatives include no neck pain. Past treatments include acetaminophen, oral decongestants, saline sprays and spray decongestants. The treatment provided no relief.      Review of Systems  HENT: Positive for congestion and sore throat. Negative for neck pain.   Respiratory: Positive for cough.   Cardiovascular: Negative for chest pain.  Gastrointestinal: Negative for nausea, vomiting and abdominal pain.  Neurological: Positive for headaches.       Objective:   Physical Exam  Constitutional: She appears well-developed and well-nourished. No distress.  HENT:  Head: Normocephalic and atraumatic.  Right Ear: External ear normal.  Left Ear: External ear normal.       Nasal erythema.  + Maxillary and frontal sinus tenderness.  Eyes: Pupils are equal, round, and reactive to light.       Normal fundoscopic  Neck: Normal range of motion.  Neck supple.  Cardiovascular: Normal rate and regular rhythm.   Pulmonary/Chest: Effort normal and breath sounds normal. No respiratory distress. She has no wheezes. She has no rales. She exhibits no tenderness.  Skin: Skin is warm and dry. No rash noted. She is not diaphoretic. No erythema.  Psychiatric: She has a normal mood and affect.          Assessment & Plan:

## 2010-05-24 NOTE — Assessment & Plan Note (Signed)
Likely viral URI vs bronchitis.  Will treat with Z-pak given duration and worsening course.

## 2010-06-08 LAB — BASIC METABOLIC PANEL
BUN: 14 mg/dL (ref 6–23)
CO2: 29 mEq/L (ref 19–32)
Calcium: 8.8 mg/dL (ref 8.4–10.5)
Chloride: 103 mEq/L (ref 96–112)
Creatinine, Ser: 0.91 mg/dL (ref 0.4–1.2)
GFR calc Af Amer: >60 mL/min (ref >60)
GFR calc non Af Amer: >60 mL/min (ref >60)
Glucose, Bld: 96 mg/dL (ref 70–99)
Potassium: 4.3 mEq/L (ref 3.5–5.1)
Sodium: 137 mEq/L (ref 135–145)

## 2010-06-08 LAB — CBC
HCT: 39.8 % (ref 36.0–46.0)
Hemoglobin: 12.9 g/dL (ref 12.0–15.0)
MCH: 28.6 pg (ref 26.0–34.0)
MCHC: 32.4 g/dL (ref 30.0–36.0)
MCV: 88.2 fL (ref 78.0–100.0)
Platelets: 180 10*3/uL (ref 150–400)
RBC: 4.51 MIL/uL (ref 3.87–5.11)
RDW: 16.7 % — ABNORMAL HIGH (ref 11.5–15.5)
WBC: 8.6 10*3/uL (ref 4.0–10.5)

## 2010-06-08 LAB — SURGICAL PCR SCREEN
MRSA, PCR: NEGATIVE
Staphylococcus aureus: NEGATIVE

## 2010-06-11 LAB — CBC
HCT: 43.1 % (ref 36.0–46.0)
Hemoglobin: 14.1 g/dL (ref 12.0–15.0)
MCHC: 32.8 g/dL (ref 30.0–36.0)
MCV: 88.1 fL (ref 78.0–100.0)
Platelets: 183 10*3/uL (ref 150–400)
RBC: 4.89 MIL/uL (ref 3.87–5.11)
RDW: 17.6 % — ABNORMAL HIGH (ref 11.5–15.5)
WBC: 7.8 10*3/uL (ref 4.0–10.5)

## 2010-06-11 LAB — LIPASE, BLOOD: Lipase: 40 U/L (ref 11–59)

## 2010-06-11 LAB — COMPREHENSIVE METABOLIC PANEL
ALT: 19 U/L (ref 0–35)
AST: 18 U/L (ref 0–37)
Albumin: 4.2 g/dL (ref 3.5–5.2)
Alkaline Phosphatase: 70 U/L (ref 39–117)
BUN: 11 mg/dL (ref 6–23)
CO2: 29 mEq/L (ref 19–32)
Calcium: 9.1 mg/dL (ref 8.4–10.5)
Chloride: 105 mEq/L (ref 96–112)
Creatinine, Ser: 0.89 mg/dL (ref 0.4–1.2)
GFR calc Af Amer: >60 mL/min (ref >60)
GFR calc non Af Amer: >60 mL/min (ref >60)
Glucose, Bld: 114 mg/dL — ABNORMAL HIGH (ref 70–99)
Potassium: 3.6 mEq/L (ref 3.5–5.1)
Sodium: 137 mEq/L (ref 135–145)
Total Bilirubin: 1.2 mg/dL (ref 0.3–1.2)
Total Protein: 7.3 g/dL (ref 6.0–8.3)

## 2010-06-11 LAB — URINALYSIS, ROUTINE W REFLEX MICROSCOPIC
Bilirubin Urine: NEGATIVE
Glucose, UA: NEGATIVE mg/dL
Hgb urine dipstick: NEGATIVE
Ketones, ur: NEGATIVE mg/dL
Nitrite: NEGATIVE
Protein, ur: NEGATIVE mg/dL
Specific Gravity, Urine: 1.009 (ref 1.005–1.030)
Urobilinogen, UA: 0.2 mg/dL (ref 0.0–1.0)
pH: 8 (ref 5.0–8.0)

## 2010-06-11 LAB — DIFFERENTIAL
Basophils Absolute: 0 10*3/uL (ref 0.0–0.1)
Basophils Relative: 0 % (ref 0–1)
Eosinophils Absolute: 0 10*3/uL (ref 0.0–0.7)
Eosinophils Relative: 0 % (ref 0–5)
Lymphocytes Relative: 18 % (ref 12–46)
Lymphs Abs: 1.4 10*3/uL (ref 0.7–4.0)
Monocytes Absolute: 0.4 10*3/uL (ref 0.1–1.0)
Monocytes Relative: 6 % (ref 3–12)
Neutro Abs: 5.9 10*3/uL (ref 1.7–7.7)
Neutrophils Relative %: 76 % (ref 43–77)

## 2010-06-12 LAB — COMPREHENSIVE METABOLIC PANEL
ALT: 20 U/L (ref 0–35)
AST: 24 U/L (ref 0–37)
Albumin: 4 g/dL (ref 3.5–5.2)
Alkaline Phosphatase: 63 U/L (ref 39–117)
BUN: 11 mg/dL (ref 6–23)
CO2: 29 mEq/L (ref 19–32)
Calcium: 8.6 mg/dL (ref 8.4–10.5)
Chloride: 102 mEq/L (ref 96–112)
Creatinine, Ser: 0.88 mg/dL (ref 0.4–1.2)
GFR calc Af Amer: >60 mL/min (ref >60)
GFR calc non Af Amer: >60 mL/min (ref >60)
Glucose, Bld: 107 mg/dL — ABNORMAL HIGH (ref 70–99)
Potassium: 3.6 mEq/L (ref 3.5–5.1)
Sodium: 137 mEq/L (ref 135–145)
Total Bilirubin: 0.5 mg/dL (ref 0.3–1.2)
Total Protein: 6.8 g/dL (ref 6.0–8.3)

## 2010-06-12 LAB — DIFFERENTIAL
Basophils Absolute: 0.1 10*3/uL (ref 0.0–0.1)
Basophils Relative: 1 % (ref 0–1)
Eosinophils Absolute: 0.1 10*3/uL (ref 0.0–0.7)
Eosinophils Relative: 2 % (ref 0–5)
Lymphocytes Relative: 35 % (ref 12–46)
Lymphs Abs: 2.8 10*3/uL (ref 0.7–4.0)
Monocytes Absolute: 0.5 10*3/uL (ref 0.1–1.0)
Monocytes Relative: 6 % (ref 3–12)
Neutro Abs: 4.5 10*3/uL (ref 1.7–7.7)
Neutrophils Relative %: 56 % (ref 43–77)

## 2010-06-12 LAB — URINALYSIS, ROUTINE W REFLEX MICROSCOPIC
Bilirubin Urine: NEGATIVE
Glucose, UA: NEGATIVE mg/dL
Hgb urine dipstick: NEGATIVE
Ketones, ur: NEGATIVE mg/dL
Nitrite: NEGATIVE
Protein, ur: NEGATIVE mg/dL
Specific Gravity, Urine: 1.011 (ref 1.005–1.030)
Urobilinogen, UA: 1 mg/dL (ref 0.0–1.0)
pH: 7 (ref 5.0–8.0)

## 2010-06-12 LAB — CBC
HCT: 36.3 % (ref 36.0–46.0)
Hemoglobin: 12 g/dL (ref 12.0–15.0)
MCHC: 33.1 g/dL (ref 30.0–36.0)
MCV: 87.1 fL (ref 78.0–100.0)
Platelets: 174 10*3/uL (ref 150–400)
RBC: 4.16 MIL/uL (ref 3.87–5.11)
RDW: 17.4 % — ABNORMAL HIGH (ref 11.5–15.5)
WBC: 8 10*3/uL (ref 4.0–10.5)

## 2010-06-12 LAB — POCT PREGNANCY, URINE: Preg Test, Ur: NEGATIVE

## 2010-07-12 ENCOUNTER — Telehealth: Payer: Self-pay | Admitting: Family Medicine

## 2010-07-12 NOTE — Telephone Encounter (Signed)
Requesting new rx for ambien & vicodin

## 2010-07-16 ENCOUNTER — Telehealth: Payer: Self-pay | Admitting: *Deleted

## 2010-07-16 NOTE — Telephone Encounter (Signed)
Message copied by Jimmy Footman on Mon Jul 16, 2010  3:08 PM ------      Message from: DE LA CRUZ, Shannon Stuart      Created: Sat Jul 14, 2010  1:24 PM      Regarding: appointment       Hello, this patient will need to schedule an appointment with me if she wants those medications filled.            Thanks.

## 2010-07-16 NOTE — Telephone Encounter (Signed)
Message copied by Jimmy Footman on Mon Jul 16, 2010  3:16 PM ------      Message from: DE LA CRUZ, IVY      Created: Sat Jul 14, 2010  1:24 PM      Regarding: appointment       Hello, this patient will need to schedule an appointment with me if she wants those medications filled.            Thanks.

## 2010-07-17 ENCOUNTER — Telehealth: Payer: Self-pay | Admitting: *Deleted

## 2010-07-17 NOTE — Telephone Encounter (Signed)
      Call Documentation     Erma Pinto 07/16/2010 3:16 PM Signed  Message copied by Jimmy Footman on Mon Jul 16, 2010 3:16 PM  ------  Message from: DE LA CRUZ, IVY  Created: Sat Jul 14, 2010 1:24 PM  Regarding: appointment  Hello, this patient will need to schedule an appointment with me if she wants those medications filled.  Thanks. Erma Pinto 07/16/2010 3:08 PM Signed  Message copied by Jimmy Footman on Mon Jul 16, 2010 3:08 PM  ------  Message from: DE LA CRUZ, IVY  Created: Sat Jul 14, 2010 1:24 PM  Regarding: appointment  Hello, this patient will need to schedule an appointment with me if she wants those medications filled.  Thanks.

## 2010-08-07 NOTE — Consult Note (Signed)
NAME:  Shannon Stuart, Shannon Stuart                 ACCOUNT NO.:  [id number]   MEDICAL RECORD NO.:  [id number]          PATIENT TYPE:  INP   LOCATION:  3707                         FACILITY:  [facility]   PHYSICIAN:  Elmore Guise., M.D.DATE OF BIRTH:  09-Nov-1960   DATE OF CONSULTATION:  DATE OF DISCHARGE:                                 CONSULTATION   INDICATIONS FOR CONSULT:  Chest pain, strong family history of early  heart disease.   HISTORY OF PRESENT ILLNESS:  [name] is a very pleasant 50 year old  African American female with a past medical history of labile  hypertension, strong family history of early heart disease who presents  with a 56-month history of progressive substernal chest pain.  The  patient reports her initial symptoms starting 1 month ago and primarily  only with exertion.  She stated at that time she was having difficulty  with blood pressure with blood pressure running in the 170-180 range.  Her blood pressure medicines have been changed, however, her symptoms  have continued.  They have now progressed to where she is having more  frequent spells, chest pressure and tightness.  This is associated with  shortness of breath and dizziness.  Some nausea noted today.  Her  symptoms are worse with activity, improve with rest.  Today she awoke  just not feeling right.  She had an episode of chest tightness at  rest, which is new for her.  She is now admitted for further evaluation.  She did report today she had some inspiratory worsening, however, this  is the first time she ever had any problems with this.  She also notes  productive cough of yellow-green phlegm, but no fever.  She has had no  orthopnea or PND.  No significant palpitations.  No presyncope or  syncope.  She has normal menstruation.  Her weight is stable for her.   REVIEW OF SYSTEMS:  Review of systems are as per HPI.  All others are  negative.   CURRENT MEDICATIONS:  Ambien, Flexeril, ibuprofen., Protonix and  lisinopril HCTZ 20/25 mg once daily.   ALLERGIES:  None.   FAMILY HISTORY:  Positive for heart disease with her mother having a  heart attack at age 36.  Her grandmother also had heart disease and  heart failure.   SOCIAL HISTORY:  She is single, but in a monogamous relationship.  History of tobacco.  Quit in 1993.  Occasional alcohol noted.   EXAM:  She is afebrile.  Blood pressure 135/70, heart rate 70, satting  98% on room air.  GENERAL:  She is a very pleasant middle-aged [place] American female,  alert and oriented x4.  No acute distress.  HEENT:  Appear normal.  NECK:  Supple.  No lymphadenopathy, 2+ carotids, no JVD and no bruits.  LUNGS:  Clear with good breath sounds at the bases.  HEART:  Regular with no significant murmur, gallops or rubs.  ABDOMEN:  Soft, nontender, nondistended.  No rebound or guarding.  EXTREMITIES:  Warm with 2+ pulses and no edema.  Femoral pulses 2+ and  easily  felt.   Her ECG shows sinus bradycardia, rate of 58 per minute, with no  significant ST or T-wave changes.  Chest x-ray showed no acute  cardiopulmonary disease.  Cardiac markers are negative with a CPK of  187, MB of 1.6, troponin-I of 0.01.  She had a BUN and creatinine of 13  and 0.8 with a potassium level of 4.7.  White blood cell count of 6.5,  hemoglobin 12.6 and platelet count 189.  Her coags show a PT INR of 12.3  and 0.9 with PTT of 29.   IMPRESSION:  1. Chest pain  2. Hypertension.  3. Strong family history of early heart disease.   PLAN:  At this time I agree with her current treatment.  We will  continue her heparin, aspirin, Plavix for now.  I would add Nitrol paste  as she is having a little bit of refractory symptoms. as well as  metoprolol 12.5 mg twice daily.  She is to have continued serial cardiac  markers as well as a fasting lipid panel in the morning.  Because of her  symptoms, I would recommend invasive workup.  She will be scheduled for  catheterization in the  in the morning.  She will be n.p.o. after  midnight.  Please call if any further cardiac concerns arise.      Elmore Guise., M.D.  Electronically Signed     TWK/MEDQ  D:  05/26/2007  T:  05/26/2007  Job:  [id number]

## 2010-08-07 NOTE — Discharge Summary (Signed)
NAME:  Shannon Stuart, Shannon Stuart                 ACCOUNT NO.:  [id number]   MEDICAL RECORD NO.:  [id number]          PATIENT TYPE:  INP   LOCATION:  3707                         FACILITY:  [facility]   PHYSICIAN:  Zenaida Deed. Mayford Knife, M.D.DATE OF BIRTH:  1960/11/20   DATE OF ADMISSION:  05/26/2007  DATE OF DISCHARGE:  05/27/2007                               DISCHARGE SUMMARY   PRIMARY CARE Tawnie Ehresman:  Dr. Lequita Asal of [facility].   CONSULTANTS:  Dr. Lady Deutscher, cardiology.   PROCEDURES:  Cardiac catheterization on May 27, 2007, which was  significant for non-obstructive coronary arteries with minimal luminal  irregularities and normal left ventricular systolic function with EF  60%.   REASON FOR ADMISSION:  The patient is a 50 year old female who presented  to [facility] outpatient clinic complaining of elevated  blood pressure, was found to have symptoms concerning for unstable  angina.   DISCHARGE DIAGNOSES:  1. Atypical chest pain.  2. Hypertension.  3. Anxiety.  4. Gastroesophageal reflux disease.  5. Insomnia.   DISCHARGE MEDICATIONS:  1. Aspirin 81 mg p.o. daily.  2. Lisinopril 20 mg p.o. daily.  3. Metoprolol 12.5 mg p.o. b.i.d.  4. Nitroglycerin 0.4 mg, 1 tab sublingual q. 5 minutes up to 3 doses      in 15 minutes for chest pain.  5. Clonazepam 0.5 mg p.o. t.i.d. p.r.n. anxiety.  6. Ambien 10 mg p.o. at bedtime p.r.n. insomnia.  7. Protonix 40 mg p.o. daily.  8. Pravastatin 20 mg p.o. daily.   HOSPITAL COURSE:  1. Chest pain.  The patient presented to [facility] complaining, on review of systems, substernal chest pain      radiating to her left shoulder x1 month.  She endorsed that this      happened with exertion and was relieved by rest.  She had several      episodes per day.  She said that the episode the morning of      admission was associated with diaphoresis and nausea.  She has a      family history  significant for a mother who died of MI at the age      of 21.  The patient did not have any EKG changes on the      electrocardiogram done in the clinic.  She did have a family      history as mentioned, as well as a history of hypertension and      remote history of tobacco use.  She was admitted to the hospital to      rule out myocardial infarction.  She had cardiac enzymes drawn,      cardiology consult requested and she was started on heparin,      Lopressor, nitroglycerin and morphine as treatment for chronic      stable angina or NSTEMI.  Dr. Reyes Ivan of cardiology was consulted      and he agreed with the plan.  The patient underwent cardiac      catheterization, which  was significant for non-obstructive coronary      arteries with mild luminal irregularities.  The patient also had      normal left ventricular systolic with an EF 50%.  Therefore, the      patient was discharged following her cardiac catheterization with      medications for her blood pressure to optimize blood pressure      control, medication for her anxiety given the possible anxiety      component to her chest pain, aspirin and nitroglycerin to use      p.r.n.  We will follow up in this issue as an outpatient.  2. Hypertension.  The patient recently presented to the [facility] with concerns about blood pressure medication      regimen.  Given concern for NSTEMI, she was started on Lopressor in      hospital.  She will discharged on this as well, in addition to      lisinopril.  We will titrate her medications further outpatient.  3. Anxiety.  The patient does have a history of anxiety and does      endorse some recent stressors coinciding in time to chest pain.      Ran out of clonazepam approximately 1 month ago.  We will refill      clonazepam in hopes of treating her anxiety.  Will follow up as an      outpatient.  4. Prevention.  Given the patient's complaints and family history of       early cardiac death, she was discharged on aspirin, metoprolol,      lisinopril and pravastatin in order to optimize treatment of any      cardiac risk factors.   DISCHARGE CONDITION:  Stable.   TESTS RESULTS AT DISCHARGE:  Triglycerides 229, HDL 38, LDL 108.  Hemoglobin 11.5, creatinine 0.82, sodium 138, potassium 4.7.   DISPOSITION:  The patient is discharged to home.   DISCHARGE FOLLOWUP:  She is to follow up with Dr. Lequita Asal in a  month at [facility] on Friday March 13 at 3:45 p.m.   FOLLOWUP ISSUES:  1. Hypertension.  2. Anxiety.  3. Use of nitroglycerin for chest pain.      Lauro Franklin, MD  Electronically Signed      Zenaida Deed. Mayford Knife, M.D.  Electronically Signed    TCB/MEDQ  D:  05/28/2007  T:  05/28/2007  Job:  [id number]

## 2010-08-07 NOTE — Telephone Encounter (Signed)
Per MD, pt must be seen for refills on requested meds.

## 2010-08-10 ENCOUNTER — Ambulatory Visit (INDEPENDENT_AMBULATORY_CARE_PROVIDER_SITE_OTHER): Payer: Self-pay | Admitting: Family Medicine

## 2010-08-10 ENCOUNTER — Encounter: Payer: Self-pay | Admitting: Family Medicine

## 2010-08-10 DIAGNOSIS — F329 Major depressive disorder, single episode, unspecified: Secondary | ICD-10-CM

## 2010-08-10 DIAGNOSIS — M545 Low back pain, unspecified: Secondary | ICD-10-CM

## 2010-08-10 DIAGNOSIS — F32A Depression, unspecified: Secondary | ICD-10-CM | POA: Insufficient documentation

## 2010-08-10 MED ORDER — GABAPENTIN 100 MG PO CAPS: 100.0000 mg | ORAL_CAPSULE | Freq: Three times a day (TID) | ORAL | Status: AC

## 2010-08-10 MED ORDER — FLUOXETINE HCL 20 MG PO CAPS: 20.0000 mg | ORAL_CAPSULE | Freq: Every day | ORAL | Status: DC

## 2010-08-10 NOTE — Patient Instructions (Signed)
It was nice to see you again. Please go to [facility] to pick up the following prescriptions: 1) Gabapentin for neurogenic pain 2) Prozac for anxiety/depression Take medications as directed. If you experience any side effects, please call MD or return to clinic. Please schedule a follow up appointment with me in 4 weeks to monitor knot on your back and discuss new medications. Thank you.

## 2010-08-10 NOTE — Progress Notes (Signed)
  Subjective:    Patient ID: Shannon Stuart, female    DOB: 03-28-1960, 50 y.o.   MRN: [id number]  HPI This is a 50 year old female who presents to clinic with multiple medical complaints.    1) Bilateral knee pain, back pain, and hip pain: chronic issues have been going on for >25 years.  Patient has seen spinal surgeon, Dr. Danielle Dess, in the past recommended spinal fusion surgery.  Patient is awaiting Medicaid approval.  Patient says pain is becoming worse.  Back pain now radiating to hips and bilateral knees.  Pain is moderate, but affects her balance and weight-bearing.  Has been taking Motrin 800 mg as needed but provides little relief.  2) Depression/Anxiety: patient has been very immobile lately.  She does not work.  Exercise is painful so she sits at home, watches TV, and eats multiple meals all day.  Thus, she is gaining weight and losing energy.  Patient says "Why am I still alive?"  Denies any suicidal or homicidal thoughts.  Does endorse changes in sleep, appetite, interest, pleasure in activities.  Still takes Clonazepam but not helping anymore.  3) Knot on upper back: this is painful and irritating.  Wants to know if it is causing back pain.    Review of Systems Constitutional: Positive for activity change and appetite change. Negative for fever.  Respiratory: Negative for shortness of breath.  Cardiovascular: Negative for chest pain and leg swelling.  Neurological: Positive for weakness and numbness.  Psychiatric/Behavioral:  Depressed mood, tearful      Objective:   Physical Exam Constitutional: She appears well-developed and well-nourished.  obese  HENT:  Head: Normocephalic and atraumatic.  Neck: Neck supple.  Cardiovascular: Normal rate and regular rhythm. Exam reveals no gallop and no friction rub.  No murmur heard.  Pulmonary/Chest: Breath sounds normal. She has no wheezes. She has no rales.  Musculoskeletal: She exhibits tenderness. She exhibits no edema.  Right  shoulder: She exhibits tenderness and decreased strength. She exhibits normal range of motion, no swelling, no deformity and normal pulse.  Normal gait  Psychiatric: Her speech is normal. She is slowed and withdrawn. She exhibits a depressed mood. She expresses no homicidal and no suicidal ideation.        Assessment & Plan:

## 2010-08-10 NOTE — Assessment & Plan Note (Signed)
Patient has been seen by Dr. Danielle Dess (spinal surgeon) in the past for lumbar stenosis.  Patient needs to have spinal surgery once Medicaid is approved.  For now, I can only treat symptoms.  Will give Rx for Gabapentin 100 mg by mouth TID.  May need to increase dose once Medicaid comes through.  Of note, the knot or cyst on L thoracic spine will be monitored for growth, pain, or erythema.  Patient to RTC in 4 weeks for follow-up.  Advised patient to call Dr. Verlee Rossetti office if needs a refill for Vicodin.  Patient agreed and understood plan.

## 2010-08-10 NOTE — Assessment & Plan Note (Signed)
Patient seems to be experiencing depression vs. Adjustment disorder secondary to chronic pain and medical problems.  Patient requesting a referral to Behavioral Health at this time.  However, patient has not tried SSRIs/SNRIs yet and this could be beneficial in a patient with depression and chronic pain.  Will start Prozac 20 mg po daily.  Would prefer Lyrica but patient awaiting Medicaid and Lyrica is not on any preferred list.  Patient to follow up in 4 weeks.  She denies any SI or homicidial ideations.  Will follow this patient closely.

## 2010-08-10 NOTE — Cardiovascular Report (Signed)
NAME:  EMSLEY, Shannon Stuart                 ACCOUNT NO.:  [id number]   MEDICAL RECORD NO.:  [id number]          PATIENT TYPE:  INP   LOCATION:  3707                         FACILITY:  [facility]   PHYSICIAN:  Elmore Guise., M.D.DATE OF BIRTH:  [birth year]   DATE OF PROCEDURE:  05/27/2006  DATE OF DISCHARGE:                            CARDIAC CATHETERIZATION   INDICATIONS FOR PROCEDURE:  Strong family history of early heart  disease.  Patient with continued exertional chest pain, evaluate for  ischemic heart disease.   DESCRIPTION OF PROCEDURE:  The patient brought to the cardiac cath lab  after appropriate informed consent.  She is prepped and draped sterile  fashion.  Approximately 10 mL of 1% lidocaine was used for local  anesthesia.  A 5-French sheath placed in right femoral artery without  difficulty.  Coronary angiography, LV angiography was then performed.  The patient tolerated the procedure well and was transferred from the  cath lab in stable condition.   FINDINGS:  1. Left Main:  Very short no significant disease.  2. LAD:  Mild luminal irregularities.  3. D1:  Moderate-sized vessel mild luminal irregularities.  4. D2:  Small vessel with mild luminal irregularities.  5. LCX:  Nondominant with mild luminal irregularities.  6. OM-1/OM-2:  Large vessels with mild luminal irregularities.  7. RCA:  Dominant with mild luminal rate with mild luminal      irregularities.  8. PDA/PLV:  Small to moderate size with mild luminal irregularities.  9. LV:  EF 60%.  No wall motion abnormalities.  LVEDP is 22 mmHg.   IMPRESSION:  1. 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  2. Normal LV systolic function, EF of 60%.  No wall motion      abnormalities.   PLAN:  At this time I would recommend aggressive risk factor modification as  indicated.  She will need good blood pressure control as well as lipid  management.  She does have mildly elevated triglycerides, would  recommend adding fish oil at this time with recheck to see if any  further agents are needed.  From cardiac standpoint, the patient is  chest pain free and without any significant coronary disease.  If she  continues to be stable after lunch, I do think it will be fine for  discharge later today.  I would like to follow her up in 2 weeks in the  office.      Elmore Guise., M.D.  Electronically Signed    TWK/MEDQ  D:  05/27/2007  T:  05/27/2007  Job:  [id number]   cc:   Drue Dun, M.D.

## 2010-09-10 ENCOUNTER — Encounter: Payer: Self-pay | Admitting: Family Medicine

## 2010-09-10 ENCOUNTER — Ambulatory Visit (INDEPENDENT_AMBULATORY_CARE_PROVIDER_SITE_OTHER): Payer: Self-pay | Admitting: Family Medicine

## 2010-09-10 ENCOUNTER — Other Ambulatory Visit: Payer: Self-pay | Admitting: Family Medicine

## 2010-09-10 VITALS — BP 128/96 | HR 63 | Temp 98.1°F | Ht 68.0 in | Wt 239.4 lb

## 2010-09-10 DIAGNOSIS — M545 Low back pain, unspecified: Secondary | ICD-10-CM

## 2010-09-10 DIAGNOSIS — F329 Major depressive disorder, single episode, unspecified: Secondary | ICD-10-CM

## 2010-09-10 DIAGNOSIS — M549 Dorsalgia, unspecified: Secondary | ICD-10-CM

## 2010-09-10 DIAGNOSIS — F32A Depression, unspecified: Secondary | ICD-10-CM

## 2010-09-10 MED ORDER — KETOROLAC TROMETHAMINE 60 MG/2ML IM SOLN
60.0000 mg | Freq: Once | INTRAMUSCULAR | Status: AC
  Administered 2010-09-10: 60 mg via INTRAMUSCULAR

## 2010-09-10 MED ORDER — CLONAZEPAM 1 MG PO TABS: 1.0000 mg | ORAL_TABLET | Freq: Two times a day (BID) | ORAL | Status: DC | PRN

## 2010-09-10 MED ORDER — ZOLPIDEM TARTRATE 10 MG PO TABS: 10.0000 mg | ORAL_TABLET | Freq: Every evening | ORAL | Status: DC | PRN

## 2010-09-10 NOTE — Telephone Encounter (Signed)
Refill request

## 2010-09-10 NOTE — Patient Instructions (Signed)
Thanks for coming to see me today. Please take anxiety medications as needed for sleep.   Do not take if you start to feel drowsy or if you start to have difficulty breathing. Please call Dr. Sheran Fava to refill your pain medications. If your pain does not improve in 2-4 weeks, please return to clinic for steroid injections. Keep me updated regarding Medicaid approval. Thanks.

## 2010-09-12 NOTE — Progress Notes (Signed)
  Subjective:    Patient ID: Shannon Stuart, female    DOB: 01-25-1961, 50 y.o.   MRN: [id number]  Back Pain Pertinent negatives include no chest pain, dysuria or headaches.  Shoulder Pain   This is a 50 year old female who presents to clinic for follow up lumbar pain, knee pain, and hip pain.  Awaiting Medicaid approval for spinal fusion sx per Dr. Danielle Dess.  Patient currently takes Motrin, Gabapentin, and Flexeril.  Pain is still unbearable.  Chronic pain has been worsening patient's anxiety and patient wants to "give up".   Says she takes Prozac daily, but has not seen any difference in her mood.  Review of Systems  HENT: Negative for neck pain and neck stiffness.   Respiratory: Negative for shortness of breath.   Cardiovascular: Negative for chest pain.  Genitourinary: Negative for dysuria.  Musculoskeletal: Positive for back pain and arthralgias.  Neurological: Negative for headaches.  Psychiatric/Behavioral: Positive for behavioral problems.      Objective:   Physical Exam  Musculoskeletal:       Right knee: She exhibits decreased range of motion. She exhibits no swelling, no effusion, no deformity and no erythema. tenderness found.       Left knee: She exhibits decreased range of motion and bony tenderness. She exhibits no swelling, no ecchymosis, no deformity and no erythema. tenderness found.       Lumbar back: She exhibits decreased range of motion, tenderness, bony tenderness and pain. She exhibits no swelling and no deformity.   Constitutional: She appears well-developed and well-nourished.  obese  Normal gait  Psychiatric: Her speech is normal.  She exhibits a depressed mood.        Assessment & Plan:

## 2010-09-12 NOTE — Assessment & Plan Note (Signed)
Patient has been taking Prozac for 4 weeks.  Says it has not helped pain or symptoms of depression.  Pt requesting Clonazepam refills and Ambien for sleep.  Will refill for now.  Continue with current regimen.  Follow up in 4-6 weeks.

## 2010-09-12 NOTE — Assessment & Plan Note (Addendum)
Pt awaiting Medicaid approval so that Dr. Sheran Fava can perform spinal surgery.  Will control pain in the meantime.  Continue with Motrin, Gabapentin, Flexeril.  Pt to call Dr. Nunzio Cobbs office for refill Vicodin.  Will give Toradol IM in office today.  Patient may benefit from steroid injection in R knee at next visit.  Pt to follow up as needed if pain or symptoms worsen.

## 2010-09-14 ENCOUNTER — Telehealth: Payer: Self-pay | Admitting: Family Medicine

## 2010-09-14 NOTE — Telephone Encounter (Signed)
Would like to pick up form for a handicap sticker.  Also wanted to let her know that she has the orange card.

## 2010-09-17 NOTE — Telephone Encounter (Signed)
Filled out my portion of the form.  Called patient to let her know it is waiting for her at the front desk.

## 2010-09-24 ENCOUNTER — Ambulatory Visit (INDEPENDENT_AMBULATORY_CARE_PROVIDER_SITE_OTHER): Payer: Self-pay | Admitting: Family Medicine

## 2010-09-24 ENCOUNTER — Encounter: Payer: Self-pay | Admitting: Family Medicine

## 2010-09-24 DIAGNOSIS — R600 Localized edema: Secondary | ICD-10-CM

## 2010-09-24 DIAGNOSIS — M545 Low back pain, unspecified: Secondary | ICD-10-CM

## 2010-09-24 DIAGNOSIS — R609 Edema, unspecified: Secondary | ICD-10-CM

## 2010-09-24 MED ORDER — LISINOPRIL-HYDROCHLOROTHIAZIDE 20-25 MG PO TABS: 1.0000 | ORAL_TABLET | Freq: Every day | ORAL | Status: DC

## 2010-09-24 MED ORDER — PANTOPRAZOLE SODIUM 40 MG PO TBEC: 40.0000 mg | DELAYED_RELEASE_TABLET | Freq: Two times a day (BID) | ORAL | Status: DC

## 2010-09-24 NOTE — Assessment & Plan Note (Signed)
Likely multifactorial in etiology - adverse effect of amlodipine, water retention, elevated BP, heat.  Will discontinue amlodipine.  Will increase HCTZ to 25 q D.  Encouraged patient to elevate feet over heart at bedtime or when resting.  No signs/symptoms of heart failure at this time.  BP is elevated today, so will need to recheck in 4-6 weeks on new regimen.

## 2010-09-24 NOTE — Assessment & Plan Note (Signed)
Patient now on disability.  Dr. Danielle Dess will no longer give Rx for Vicodin.  Patient to return to clinic 4-6 weeks to sign pain contract for narcotics.

## 2010-09-24 NOTE — Progress Notes (Signed)
  Subjective:    Patient ID: Shannon Stuart, female    DOB: September 22, 1960, 50 y.o.   MRN: [id number]  HPI Patient presents to clinic with mild pedal edema.  She noticed it 2 days ago when she wore flip flops, but thinks that her feet have been swollen for a few weeks.  She remembers her shoes have been tight lately.  Patient thinks it could be secondary to heat and being on her feet often.  She has been elevating her feet at bedtime which helps decrease the swelling significantly.  Denies shortness of breath, chest pain, excessive sweating.  Does endorse frequent urination, but no dysuria.  No erythema or pain associated with lower extremity edema.    Review of Systems Per HPI    Objective:   Physical Exam  Constitutional: She appears well-developed and well-nourished. No distress.  Neck: Neck supple. No JVD present.  Cardiovascular: Normal rate, normal heart sounds and intact distal pulses.  Exam reveals no gallop.   No murmur heard. Pulmonary/Chest: Effort normal and breath sounds normal. No respiratory distress. She has no wheezes. She has no rales. She exhibits no tenderness.  Musculoskeletal: Normal range of motion.       Right ankle: She exhibits swelling. She exhibits normal range of motion, no ecchymosis and no deformity. no tenderness. Achilles tendon exhibits no pain.       Left ankle: She exhibits swelling. She exhibits normal range of motion, no ecchymosis and no deformity. no tenderness. Achilles tendon exhibits no pain.       1+ pitting edema, no erythema, negative calf tenderness, pulses 2+ bil          Assessment & Plan:

## 2010-09-24 NOTE — Patient Instructions (Signed)
Good to see you again. Please stop taking Amlodipine. Start taking Lisinopril-HCTZ 20-25 1 tablet in the morning. Continue to elevate your feet over your heart. Return to clinic in 4 weeks to discuss: BP, diabetes screening, and pain contract. Stay cool and hydrated out there. Thank you.

## 2010-09-24 NOTE — Assessment & Plan Note (Signed)
>>  ASSESSMENT AND PLAN FOR LOWER EXTREMITY EDEMA WRITTEN ON 09/24/2010 11:12 AM BY DE LA CRUZ, IVY, DO  Likely multifactorial in etiology - adverse effect of amlodipine , water retention, elevated BP, heat.  Will discontinue amlodipine .  Will increase HCTZ to 25 q D.  Encouraged patient to elevate feet over heart at bedtime or when resting.  No signs/symptoms of heart failure at this time.  BP is elevated today, so will need to recheck in 4-6 weeks on new regimen.

## 2010-10-09 ENCOUNTER — Ambulatory Visit: Payer: Self-pay | Admitting: Family Medicine

## 2010-10-30 ENCOUNTER — Encounter: Payer: Self-pay | Admitting: Family Medicine

## 2010-10-30 ENCOUNTER — Ambulatory Visit (INDEPENDENT_AMBULATORY_CARE_PROVIDER_SITE_OTHER): Payer: Self-pay | Admitting: Family Medicine

## 2010-10-30 DIAGNOSIS — N938 Other specified abnormal uterine and vaginal bleeding: Secondary | ICD-10-CM

## 2010-10-30 DIAGNOSIS — N949 Unspecified condition associated with female genital organs and menstrual cycle: Secondary | ICD-10-CM

## 2010-10-30 DIAGNOSIS — M25569 Pain in unspecified knee: Secondary | ICD-10-CM

## 2010-10-30 DIAGNOSIS — M25561 Pain in right knee: Secondary | ICD-10-CM | POA: Insufficient documentation

## 2010-10-30 DIAGNOSIS — N920 Excessive and frequent menstruation with regular cycle: Secondary | ICD-10-CM

## 2010-10-30 NOTE — Patient Instructions (Addendum)
Please go to [facility] for 1) knee X-ray and 2) pelvic ultrasound. Continue to rest, ice, elevate knee, and take Motrin 800 mg every 8 hours as needed for pain. Please return to clinic in 4 weeks to follow up knee pain and menorrhagia.

## 2010-10-30 NOTE — Progress Notes (Signed)
  Subjective:    Patient ID: Shannon Stuart, female    DOB: 10-10-1960, 50 y.o.   MRN: [id number]  HPI  Right knee pain after fall: occurred 4 days ago.  She slipped in a puddle and landed on R knee. Pain is localized to anterior knee but hears a "crackling" noise when she hyperextends R leg.  No radiation.  Has been taking Motrin 800 and Vicodin as needed.  Current pain 6/10.  Able to ambulate after fall.  Relieved somewhat by ice, rest, and Vicodin, but pain comes back.  Denies any difficulty ambulating/bearing weight, weakness, SOB, numbness/tingling of extremities.  Menorrhagia: chronic issue.  She used to see an MD at [facility], but stopped going because wanted to focus on back pain first.  Cycles are regular.  Bleeding is heavy - with large clots.  Wears both pad and tampon at a time.  Changes pads/tampons every 2 hours.  Used to take Depo shots, but concerned about weight gain (and complications of low back and knee pain).  Would like medication to help decrease bleeding and stop periods.  Denies abdominal or pelvic pain.   Review of Systems Per HPI    Objective:   Physical Exam  Constitutional: She is oriented to person, place, and time. She appears well-developed and well-nourished.  Pulmonary/Chest: Effort normal and breath sounds normal.  Abdominal: Soft. There is no tenderness.  Musculoskeletal:       Right knee: She exhibits decreased range of motion, swelling and bony tenderness. She exhibits no effusion, no ecchymosis, no deformity, no laceration, no erythema, no LCL laxity, normal meniscus and no MCL laxity.       Able to extend R leg without difficulty; cannot flex leg fully 90 degrees; negative anterior & posterior drawer; negative Lachman  Neurological: She is alert and oriented to person, place, and time. No cranial nerve deficit.          Assessment & Plan:

## 2010-10-30 NOTE — Assessment & Plan Note (Signed)
Patient continues to c/o heavy bleeding and clots. Will order pelvic ultrasound to r/o endometriosis.   Depending on results, may need to perform endometrial biopsy at next visit.

## 2010-10-30 NOTE — Assessment & Plan Note (Addendum)
According to [facility] rules, will order Xray 2-view/sunburst R knee. Advised patient to continue to rest, ice, and elevate knee. Continue to take Motrin 800 q 8 hr prn pain.  Take Vicodin prn for severe pain. Patient advised to return in 4 weeks or as needed for worsening symptoms.

## 2010-11-05 ENCOUNTER — Telehealth: Payer: Self-pay | Admitting: Family Medicine

## 2010-11-05 NOTE — Telephone Encounter (Signed)
Wants about referrals - when are they scheduled

## 2010-11-06 NOTE — Telephone Encounter (Signed)
Ivy, When you are in clinic I need to speak with you about this patient. ----Huntley Dec

## 2010-11-09 NOTE — Telephone Encounter (Signed)
Is calling again about referrals - pls advise She also made an appt w/ Tye Savoy on 8/28

## 2010-11-13 ENCOUNTER — Other Ambulatory Visit: Payer: Self-pay | Admitting: Family Medicine

## 2010-11-13 DIAGNOSIS — N920 Excessive and frequent menstruation with regular cycle: Secondary | ICD-10-CM

## 2010-11-13 NOTE — Telephone Encounter (Signed)
Pt has an appt at [facility] radiology for Monday 08.27.2012 @ 845 AM. Pt will need to have a FULL BLADDER for this procedure. Pt also has an order in for a knee xray and she can inform radiology of this at the time of her appt so she can have this procedure done at that time also. If she cannot keep this appt she will need to call to cancel/reschedule at [phone].Laureen Ochs, Viann Shove

## 2010-11-15 ENCOUNTER — Telehealth: Payer: Self-pay | Admitting: Family Medicine

## 2010-11-15 NOTE — Telephone Encounter (Signed)
Told pt of appt that she has with Radiology at [facility]. She stated that she would not be able to make the appt that was set for her. I gave her the number to call to cancel/reschedule. I also told her that when she checks in at radiology that she will need to inform them that she also has an order to have an xray done. Pt agreed.Laureen Ochs, Viann Shove

## 2010-11-15 NOTE — Telephone Encounter (Signed)
LVM for patient to call back. ?

## 2010-11-15 NOTE — Telephone Encounter (Signed)
Shannon Stuart is calling Shannon Stuart back and she also needs to know what kind of Xray is being ordered for her knee.

## 2010-11-19 ENCOUNTER — Other Ambulatory Visit (HOSPITAL_COMMUNITY): Payer: Self-pay

## 2010-11-20 ENCOUNTER — Encounter: Payer: Self-pay | Admitting: Family Medicine

## 2010-11-20 ENCOUNTER — Ambulatory Visit (INDEPENDENT_AMBULATORY_CARE_PROVIDER_SITE_OTHER): Payer: Self-pay | Admitting: Family Medicine

## 2010-11-20 VITALS — BP 135/99 | HR 72 | Temp 97.9°F | Ht 68.0 in | Wt 238.0 lb

## 2010-11-20 DIAGNOSIS — N938 Other specified abnormal uterine and vaginal bleeding: Secondary | ICD-10-CM

## 2010-11-20 DIAGNOSIS — M25569 Pain in unspecified knee: Secondary | ICD-10-CM

## 2010-11-20 DIAGNOSIS — N949 Unspecified condition associated with female genital organs and menstrual cycle: Secondary | ICD-10-CM

## 2010-11-20 DIAGNOSIS — M25561 Pain in right knee: Secondary | ICD-10-CM

## 2010-11-20 DIAGNOSIS — I10 Essential (primary) hypertension: Secondary | ICD-10-CM

## 2010-11-20 MED ORDER — FLUOXETINE HCL 20 MG PO CAPS: 20.0000 mg | ORAL_CAPSULE | Freq: Every day | ORAL | Status: AC

## 2010-11-20 MED ORDER — PANTOPRAZOLE SODIUM 40 MG PO TBEC: 40.0000 mg | DELAYED_RELEASE_TABLET | Freq: Two times a day (BID) | ORAL | Status: DC

## 2010-11-20 MED ORDER — LORATADINE 10 MG PO TABS: 10.0000 mg | ORAL_TABLET | Freq: Every day | ORAL | Status: DC

## 2010-11-20 MED ORDER — ZOLPIDEM TARTRATE 10 MG PO TABS: 10.0000 mg | ORAL_TABLET | Freq: Every evening | ORAL | Status: DC | PRN

## 2010-11-20 MED ORDER — LISINOPRIL-HYDROCHLOROTHIAZIDE 20-25 MG PO TABS: 1.0000 | ORAL_TABLET | Freq: Every day | ORAL | Status: DC

## 2010-11-20 MED ORDER — CYCLOBENZAPRINE HCL 10 MG PO TABS: 10.0000 mg | ORAL_TABLET | Freq: Three times a day (TID) | ORAL | Status: DC | PRN

## 2010-11-20 MED ORDER — IBUPROFEN 800 MG PO TABS: 800.0000 mg | ORAL_TABLET | Freq: Three times a day (TID) | ORAL | Status: DC | PRN

## 2010-11-20 MED ORDER — KETOROLAC TROMETHAMINE 30 MG/ML IJ SOLN
30.0000 mg | Freq: Once | INTRAMUSCULAR | Status: AC
  Administered 2010-11-20: 30 mg via INTRAMUSCULAR

## 2010-11-20 NOTE — Patient Instructions (Signed)
Please schedule follow up appointment with me in 4 weeks to review xrays.

## 2010-11-21 ENCOUNTER — Encounter: Payer: Self-pay | Admitting: Family Medicine

## 2010-11-21 NOTE — Progress Notes (Signed)
  Subjective:    Patient ID: Shannon Stuart, female    DOB: July 12, 1960, 50 y.o.   MRN: [id number]  HPI  Patient presents to clinic to follow up menorrhagia, knee pain, and needs medication refills.  Menorrhagia: chronic, persistent.  Periods are regular like "clockwork", but continues to wear both tampon and pad, changing every 2 hours.  Wants Depo shot today - this helped symptoms in the past.  Depo topped periods completely and controlled cramps.  Has a hx of endometriosis and was seen at [facility] in the past, but stopped going to GYN doctor when back pain worsened.  Pelvic ultrasound scheduled 11/22/10.  R knee pain: patient was advised to get Xray at last visit, but had difficulty with scheduling.  Xray now scheduled for 11/22/10.  Pain persistent, comes and goes.  Swelling has improved after ice, rest, and elevation.  She has taken both Motrin 800 mg and Vicodin as needed for pain.  Pain is worse with exertion.  Located at patella and radiates to posterior knee.  Denies any falls, but continues to limp.    Review of Systems Per HPI    Objective:   Physical Exam  Constitutional: No distress.  Cardiovascular: Regular rhythm and normal heart sounds.  Exam reveals no friction rub.   No murmur heard. Pulmonary/Chest: Effort normal and breath sounds normal. She has no wheezes. She has no rales.  Musculoskeletal:       Right knee: She exhibits decreased range of motion and bony tenderness. She exhibits no swelling, no effusion, no erythema, normal alignment, no LCL laxity, normal meniscus and no MCL laxity. tenderness found.       Abnormal gait - walks with limp          Assessment & Plan:

## 2010-11-21 NOTE — Assessment & Plan Note (Addendum)
Will refill patient's Lisinopril-HCTZ 20-25. Blood pressure well controlled. Recheck in 4 weeks.

## 2010-11-21 NOTE — Assessment & Plan Note (Signed)
Pelvic ultrasound scheduled for 11/22/10. Will hold off Depo shot until after ultrasound complete and results received. Follow up in 4 weeks to discuss results - will likely need referral to [facility] at that time if menorrhagia persists.

## 2010-11-21 NOTE — Assessment & Plan Note (Signed)
>>  ASSESSMENT AND PLAN FOR HYPERTENSION, BENIGN ESSENTIAL WRITTEN ON 11/21/2010 11:42 PM BY DE LA CRUZ, IVY, DO  Will refill patient's Lisinopril -HCTZ 20-25. Blood pressure well controlled. Recheck in 4 weeks.

## 2010-11-21 NOTE — Assessment & Plan Note (Addendum)
Xray 2-view/sunburst R knee scheduled for 11/22/10. Advised patient to continue to rest, ice, and elevate knee. Continue to take Motrin 800 q 8 hr prn pain.  Take Vicodin prn for severe pain. Will give Toradol 30 mg IM x one dose now. Patient advised to return in 4 weeks to review results and follow up pain - patient may benefit from PT referral if no improvement.

## 2010-11-22 ENCOUNTER — Ambulatory Visit (HOSPITAL_COMMUNITY)
Admission: RE | Admit: 2010-11-22 | Discharge: 2010-11-22 | Disposition: A | Discharge status: Home / Self Care | Payer: Self-pay | Source: Ambulatory Visit | Attending: Family Medicine | Admitting: Family Medicine

## 2010-11-22 ENCOUNTER — Other Ambulatory Visit: Payer: Self-pay | Admitting: Family Medicine

## 2010-11-22 DIAGNOSIS — M25569 Pain in unspecified knee: Secondary | ICD-10-CM | POA: Insufficient documentation

## 2010-11-22 DIAGNOSIS — M1710 Unilateral primary osteoarthritis, unspecified knee: Secondary | ICD-10-CM | POA: Insufficient documentation

## 2010-11-22 DIAGNOSIS — D259 Leiomyoma of uterus, unspecified: Secondary | ICD-10-CM | POA: Insufficient documentation

## 2010-11-22 DIAGNOSIS — N920 Excessive and frequent menstruation with regular cycle: Secondary | ICD-10-CM

## 2010-11-22 DIAGNOSIS — IMO0002 Reserved for concepts with insufficient information to code with codable children: Secondary | ICD-10-CM | POA: Insufficient documentation

## 2010-11-22 DIAGNOSIS — R1031 Right lower quadrant pain: Secondary | ICD-10-CM | POA: Insufficient documentation

## 2010-12-17 LAB — DIFFERENTIAL
Basophils Absolute: 0
Basophils Relative: 1
Eosinophils Absolute: 0.1
Eosinophils Relative: 2
Lymphocytes Relative: 44
Lymphs Abs: 2.9
Monocytes Absolute: 0.3
Monocytes Relative: 5
Neutro Abs: 3.2
Neutrophils Relative %: 49

## 2010-12-17 LAB — BASIC METABOLIC PANEL
BUN: 13
CO2: 26
Calcium: 9
Chloride: 106
Creatinine, Ser: 0.82
GFR calc Af Amer: >60
GFR calc non Af Amer: >60
Glucose, Bld: 84
Potassium: 4.7
Sodium: 138

## 2010-12-17 LAB — CARDIAC PANEL(CRET KIN+CKTOT+MB+TROPI)
CK, MB: 1.2
CK, MB: 1.4
Relative Index: 0.9
Relative Index: 1
Total CK: 135
Total CK: 141
Troponin I: <0.01
Troponin I: <0.01

## 2010-12-17 LAB — CK TOTAL AND CKMB (NOT AT ARMC)
CK, MB: 1.6
Relative Index: 0.9
Total CK: 187 — ABNORMAL HIGH

## 2010-12-17 LAB — LIPID PANEL
Cholesterol: 192
HDL: 38 — ABNORMAL LOW
LDL Cholesterol: 108 — ABNORMAL HIGH
Total CHOL/HDL Ratio: 5.1
Triglycerides: 229 — ABNORMAL HIGH
VLDL: 46 — ABNORMAL HIGH

## 2010-12-17 LAB — CBC
HCT: 34.9 — ABNORMAL LOW
HCT: 38
Hemoglobin: 11.5 — ABNORMAL LOW
Hemoglobin: 12.6
MCHC: 33
MCHC: 33.1
MCV: 86
MCV: 86.1
Platelets: 176
Platelets: 189
RBC: 4.06
RBC: 4.41
RDW: 16.2 — ABNORMAL HIGH
RDW: 16.4 — ABNORMAL HIGH
WBC: 6.5
WBC: 8.7

## 2010-12-17 LAB — PROTIME-INR
INR: 0.9
Prothrombin Time: 12.3

## 2010-12-17 LAB — TROPONIN I: Troponin I: <0.01

## 2010-12-17 LAB — PREGNANCY, URINE: Preg Test, Ur: NEGATIVE

## 2010-12-17 LAB — HEPARIN LEVEL (UNFRACTIONATED)
Heparin Unfractionated: 0.75 — ABNORMAL HIGH
Heparin Unfractionated: 0.82 — ABNORMAL HIGH

## 2010-12-17 LAB — APTT: aPTT: 29

## 2010-12-18 ENCOUNTER — Ambulatory Visit (INDEPENDENT_AMBULATORY_CARE_PROVIDER_SITE_OTHER): Payer: Self-pay | Admitting: Family Medicine

## 2010-12-18 ENCOUNTER — Encounter: Payer: Self-pay | Admitting: Family Medicine

## 2010-12-18 VITALS — BP 163/111 | HR 98 | Temp 98.5°F | Ht 68.0 in | Wt 235.8 lb

## 2010-12-18 DIAGNOSIS — Z309 Encounter for contraceptive management, unspecified: Secondary | ICD-10-CM

## 2010-12-18 DIAGNOSIS — D259 Leiomyoma of uterus, unspecified: Secondary | ICD-10-CM

## 2010-12-18 DIAGNOSIS — Z23 Encounter for immunization: Secondary | ICD-10-CM

## 2010-12-18 DIAGNOSIS — M25561 Pain in right knee: Secondary | ICD-10-CM

## 2010-12-18 DIAGNOSIS — M25569 Pain in unspecified knee: Secondary | ICD-10-CM

## 2010-12-18 LAB — POCT URINE PREGNANCY: Preg Test, Ur: NEGATIVE

## 2010-12-18 MED ORDER — KETOROLAC TROMETHAMINE 60 MG/2ML IM SOLN
60.0000 mg | Freq: Once | INTRAMUSCULAR | Status: AC
  Administered 2010-12-18: 60 mg via INTRAMUSCULAR

## 2010-12-18 MED ORDER — METOPROLOL TARTRATE 25 MG PO TABS: 25.0000 mg | ORAL_TABLET | Freq: Two times a day (BID) | ORAL | Status: DC

## 2010-12-18 MED ORDER — CYCLOBENZAPRINE HCL 10 MG PO TABS: 10.0000 mg | ORAL_TABLET | Freq: Three times a day (TID) | ORAL | Status: DC

## 2010-12-18 MED ORDER — MEDROXYPROGESTERONE ACETATE 150 MG/ML IM SUSP
150.0000 mg | Freq: Once | INTRAMUSCULAR | Status: AC
  Administered 2010-12-18: 150 mg via INTRAMUSCULAR

## 2010-12-18 MED ORDER — LISINOPRIL 20 MG PO TABS: 20.0000 mg | ORAL_TABLET | Freq: Every day | ORAL | Status: DC

## 2010-12-18 MED ORDER — HYDROCHLOROTHIAZIDE 25 MG PO TABS: 25.0000 mg | ORAL_TABLET | Freq: Every day | ORAL | Status: AC

## 2010-12-18 MED ORDER — PROMETHAZINE HCL 25 MG/ML IJ SOLN
25.0000 mg | Freq: Once | INTRAMUSCULAR | Status: AC
  Administered 2010-12-18: 25 mg via INTRAMUSCULAR

## 2010-12-18 NOTE — Patient Instructions (Signed)
Please go to [facility] to pick up prescriptions. I will refer you to [facility] to discuss hysterectomy. Continue to exercise daily and avoid fatty foods - keep up the good work. Please call if you have any questions or concerns. You may schedule follow up appointment with me in 4 months or sooner as necessary. Good to see you again.  Fibroids (Leiomyoma's) You have been diagnosed as having a fibroid. Fibroids are smooth muscle lumps (tumors) which can occur any place in a woman's body. They are usually in the womb (uterus). The most common problem (symptom) of fibroids is bleeding. Over time this may cause low red blood cells (anemia). Other symptoms include feelings of pressure and pain in the pelvis. The diagnosis (learning what is wrong) of fibroids is made by physical exam. Sometimes tests such as an ultrasound are used. This is helpful when fibroids are felt around the ovaries and to look for tumors. TREATMENT  Most fibroids do not need surgical or medical treatment. Sometimes a tissue sample (biopsy) of the lining of the uterus is done to rule out cancer. If there is no cancer and only a small amount of bleeding, the problem can be watched.   Hormonal treatment can improve the problem.   When surgery is needed, it can consist of removing the fibroid. Vaginal birth may not be possible after the removal of fibroids. This depends on where they are and the extent of surgery. When pregnancy occurs with fibroids it is usually normal.   Your caregiver can help decide which treatments are best for you.  HOME CARE INSTRUCTIONS  Do not use aspirin as this may increase bleeding problems.   If your periods (menses) are heavy, record the number of pads or tampons used per month. Bring this information to your caregiver. This can help them determine the best treatment for you.  SEEK IMMEDIATE MEDICAL ATTENTION IF:  You have pelvic pain or cramps not controlled with medications, or experience a  sudden increase in pain.   You have an increase of pelvic bleeding between and during menses.   You feel lightheaded or have fainting spells.   You develop worsening belly (abdominal) pain.  Document Released: 03/08/2000 Document Re-Released: 06/18/2007 [facility] Patient Information 2011 [place], [place].

## 2010-12-19 ENCOUNTER — Encounter: Payer: Self-pay | Admitting: Obstetrics and Gynecology

## 2010-12-19 NOTE — Assessment & Plan Note (Signed)
Mild OA found on recent knee film. Motrin prn for chronic pain - will need to monitor kidney function, CMET at next visit. Encouraged ambulation, stretching, and heating pads/ice packs as needed for pain. Follow up in 4 months or sooner if necessary.

## 2010-12-19 NOTE — Progress Notes (Signed)
  Subjective:    Patient ID: Shannon Stuart, female    DOB: 1960-12-29, 50 y.o.   MRN: [id number]  HPI  Patient here to discuss results of recent knee Xray and pelvic ultrasound for heavy bleeding.  Knee pain: acute on chronic flare-up secondary to injury about 1 month ago.  Xray imaging reviewed - mild osteoarthritis, no fracture.  Patient states pain is well controlled on Motrin and swelling has resolved.  Able to bear weight and ambulate/exercise.  Menorrhagia: chronic issue.  Wants Depo shot today even though she may gain weight.  Interested in hysterectomy.  Urine pregnancy pending.  Reviewed [place] results - fibroid uterus with submucosal component.  Periods are regular, but patient cannot tolerate amount of bleeding.  Would like referral to [facility] to discuss hysterectomy.   Review of Systems  Per HPI    Objective:   Physical Exam  Constitutional: She appears well-nourished. No distress.  Cardiovascular: Normal rate and regular rhythm.  Exam reveals no gallop and no friction rub.   No murmur heard. Pulmonary/Chest: Effort normal and breath sounds normal.  Musculoskeletal: Normal range of motion. She exhibits no edema and no tenderness.           Assessment & Plan:

## 2010-12-19 NOTE — Assessment & Plan Note (Signed)
Urine preg - negative.  Will administer Depo shot for now. Will send referral to [facility] for hysterectomy discussion.

## 2010-12-27 ENCOUNTER — Telehealth: Payer: Self-pay | Admitting: Family Medicine

## 2010-12-27 NOTE — Telephone Encounter (Signed)
Forwarded to pcp.Shannon Stuart  

## 2010-12-27 NOTE — Telephone Encounter (Signed)
Lisinopril and HCTZ was apparently written in two separate Rx's,  Ms. Spinola is going to take them back to [facility] to have them put in as one which is how she normally gets them.  She just wanted Dr. Tye Savoy to be aware.

## 2011-01-03 NOTE — Telephone Encounter (Signed)
Has this been addressed yet?

## 2011-01-25 ENCOUNTER — Encounter: Payer: Self-pay | Admitting: Obstetrics and Gynecology

## 2011-03-07 ENCOUNTER — Ambulatory Visit (INDEPENDENT_AMBULATORY_CARE_PROVIDER_SITE_OTHER): Payer: Self-pay | Admitting: *Deleted

## 2011-03-07 DIAGNOSIS — Z309 Encounter for contraceptive management, unspecified: Secondary | ICD-10-CM

## 2011-03-07 NOTE — Progress Notes (Signed)
Patient in for Depo. Last office visit  on 09/25, 2012,   MD stated will administer Depo for now. No additional order was placed. Dr. Deirdre Priest advises RN could give Depo today but patient needs appointment to follow up with MD about this. Patient states she has appointment with Dr. Sherron Flemings Navos 12/17. Since she is still in window of receiving depo without needing upreg will have patient wait until appointment on 12/17 and discuss with dr. Tye Savoy and receive that day if indicated.. States her bleeding did stop.

## 2011-03-11 ENCOUNTER — Ambulatory Visit (INDEPENDENT_AMBULATORY_CARE_PROVIDER_SITE_OTHER): Payer: Self-pay | Admitting: Family Medicine

## 2011-03-11 ENCOUNTER — Encounter: Payer: Self-pay | Admitting: Family Medicine

## 2011-03-11 VITALS — BP 143/96 | HR 88 | Temp 98.2°F | Ht 68.0 in | Wt 232.7 lb

## 2011-03-11 DIAGNOSIS — Z309 Encounter for contraceptive management, unspecified: Secondary | ICD-10-CM

## 2011-03-11 DIAGNOSIS — M25561 Pain in right knee: Secondary | ICD-10-CM

## 2011-03-11 DIAGNOSIS — M25569 Pain in unspecified knee: Secondary | ICD-10-CM

## 2011-03-11 MED ORDER — TRAMADOL HCL 50 MG PO TABS: 50.0000 mg | ORAL_TABLET | Freq: Three times a day (TID) | ORAL | Status: DC | PRN

## 2011-03-11 MED ORDER — MEDROXYPROGESTERONE ACETATE 150 MG/ML IM SUSP
150.0000 mg | Freq: Once | INTRAMUSCULAR | Status: AC
  Administered 2011-03-11: 150 mg via INTRAMUSCULAR

## 2011-03-11 MED ORDER — KETOROLAC TROMETHAMINE 60 MG/2ML IM SOLN
60.0000 mg | Freq: Once | INTRAMUSCULAR | Status: AC
  Administered 2011-03-11: 60 mg via INTRAMUSCULAR

## 2011-03-11 NOTE — Progress Notes (Signed)
  Subjective:    Patient ID: Steward Drone, female    DOB: [birth year], 50 y.o.   MRN: [id number]  HPI  Patient presents to clinic for followup of bilateral knee pain. Pain in right knee is worse than left. This is a chronic issue that has been addressed by Dr. Danielle Dess in the past. Most recent knee x-ray revealed osteoarthritis. Current pain regimen: Motrin 800 mg as needed for pain and Flexeril 10 mg 3 times a day. Patient has questions about pain medications because current regimen is not working anymore. Patient states that it takes longer for her to get moving in the morning. Denies any abnormal gait or falls. Denies any numbness or tingling of her lower extremities.  Of note, patient has lost about 4 pounds in the last 2 months. She tries to walk daily but the knee pain has been getting worse.  .Review of Systems    Per history of present illness  Objective:   Physical Exam        Assessment & Plan:

## 2011-03-11 NOTE — Assessment & Plan Note (Signed)
Start Tramadol 50mg  Q8 prn pain.  Continue Motrin.   Handout given with knee and back exercises.   Referral to Sports Medicine for further co-management/steroid injections.

## 2011-03-11 NOTE — Patient Instructions (Addendum)
You've lost almost 4 pounds in the last 3 months. Keep up the good work. Start taking tramadol 50 mg one tablet by mouth every 8 hours as needed for severe pain. May continue to take Motrin as needed for pain. I will refer you to sports medicine clinic. We'll call you with the appointment date and time. Return to clinic in 3 months or sooner if needed.  Knee Exercises EXERCISES RANGE OF MOTION(ROM) AND STRETCHING EXERCISES These exercises may help you when beginning to rehabilitate your injury. Your symptoms may resolve with or without further involvement from your physician, physical therapist or athletic trainer. While completing these exercises, remember:   Restoring tissue flexibility helps normal motion to return to the joints. This allows healthier, less painful movement and activity.   An effective stretch should be held for at least 30 seconds.   A stretch should never be painful. You should only feel a gentle lengthening or release in the stretched tissue.  STRETCH - Knee Extension, Prone  Lie on your stomach on a firm surface, such as a bed or countertop. Place your right / left knee and leg just beyond the edge of the surface. You may wish to place a towel under the far end of your right / left thigh for comfort.   Relax your leg muscles and allow gravity to straighten your knee. Your clinician may advise you to add an ankle weight if more resistance is helpful for you.   You should feel a stretch in the back of your right / left knee. Hold this position for __________ seconds.  Repeat __________ times. Complete this stretch __________ times per day. * Your physician, physical therapist or athletic trainer may ask you to add ankle weight to enhance your stretch.  RANGE OF MOTION - Knee Flexion, Active  Lie on your back with both knees straight. (If this causes back discomfort, bend your opposite knee, placing your foot flat on the floor.)   Slowly slide your heel back toward  your buttocks until you feel a gentle stretch in the front of your knee or thigh.   Hold for __________ seconds. Slowly slide your heel back to the starting position.  Repeat __________ times. Complete this exercise __________ times per day.  STRETCH - Quadriceps, Prone   Lie on your stomach on a firm surface, such as a bed or padded floor.   Bend your right / left knee and grasp your ankle. If you are unable to reach, your ankle or pant leg, use a belt around your foot to lengthen your reach.   Gently pull your heel toward your buttocks. Your knee should not slide out to the side. You should feel a stretch in the front of your thigh and/or knee.   Hold this position for __________ seconds.  Repeat __________ times. Complete this stretch __________ times per day.  STRETCH - Hamstrings, Supine   Lie on your back. Loop a belt or towel over the ball of your right / left foot.   Straighten your right / left knee and slowly pull on the belt to raise your leg. Do not allow the right / left knee to bend. Keep your opposite leg flat on the floor.   Raise the leg until you feel a gentle stretch behind your right / left knee or thigh. Hold this position for __________ seconds.  Repeat __________ times. Complete this stretch __________ times per day.  STRENGTHENING EXERCISES These exercises may help you when beginning to  rehabilitate your injury. They may resolve your symptoms with or without further involvement from your physician, physical therapist or athletic trainer. While completing these exercises, remember:   Muscles can gain both the endurance and the strength needed for everyday activities through controlled exercises.   Complete these exercises as instructed by your physician, physical therapist or athletic trainer. Progress the resistance and repetitions only as guided.   You may experience muscle soreness or fatigue, but the pain or discomfort you are trying to eliminate should never  worsen during these exercises. If this pain does worsen, stop and make certain you are following the directions exactly. If the pain is still present after adjustments, discontinue the exercise until you can discuss the trouble with your clinician.  STRENGTH - Quadriceps, Isometrics  Lie on your back with your right / left leg extended and your opposite knee bent.   Gradually tense the muscles in the front of your right / left thigh. You should see either your knee cap slide up toward your hip or increased dimpling just above the knee. This motion will push the back of the knee down toward the floor/mat/bed on which you are lying.   Hold the muscle as tight as you can without increasing your pain for __________ seconds.   Relax the muscles slowly and completely in between each repetition.  Repeat __________ times. Complete this exercise __________ times per day.  STRENGTH - Quadriceps, Short Arcs   Lie on your back. Place a __________ inch towel roll under your knee so that the knee slightly bends.   Raise only your lower leg by tightening the muscles in the front of your thigh. Do not allow your thigh to rise.   Hold this position for __________ seconds.  Repeat __________ times. Complete this exercise __________ times per day.  OPTIONAL ANKLE WEIGHTS: Begin with ____________________, but DO NOT exceed ____________________. Increase in 1 pound/0.5 kilogram increments.  STRENGTH - Quadriceps, Straight Leg Raises  Quality counts! Watch for signs that the quadriceps muscle is working to insure you are strengthening the correct muscles and not "cheating" by substituting with healthier muscles.  Lay on your back with your right / left leg extended and your opposite knee bent.   Tense the muscles in the front of your right / left thigh. You should see either your knee cap slide up or increased dimpling just above the knee. Your thigh may even quiver.   Tighten these muscles even more and raise  your leg 4 to 6 inches off the floor. Hold for __________ seconds.   Keeping these muscles tense, lower your leg.   Relax the muscles slowly and completely in between each repetition.  Repeat __________ times. Complete this exercise __________ times per day.  STRENGTH - Hamstring, Curls  Lay on your stomach with your legs extended. (If you lay on a bed, your feet may hang over the edge.)   Tighten the muscles in the back of your thigh to bend your right / left knee up to 90 degrees. Keep your hips flat on the bed/floor.   Hold this position for __________ seconds.   Slowly lower your leg back to the starting position.  Repeat __________ times. Complete this exercise __________ times per day.  OPTIONAL ANKLE WEIGHTS: Begin with ____________________, but DO NOT exceed ____________________. Increase in 1 pound/0.5 kilogram increments.  STRENGTH - Quadriceps, Squats  Stand in a door frame so that your feet and knees are in line with the frame.  Use your hands for balance, not support, on the frame.   Slowly lower your weight, bending at the hips and knees. Keep your lower legs upright so that they are parallel with the door frame. Squat only within the range that does not increase your knee pain. Never let your hips drop below your knees.   Slowly return upright, pushing with your legs, not pulling with your hands.  Repeat __________ times. Complete this exercise __________ times per day.  STRENGTH - Quadriceps, Wall Slides  Follow guidelines for form closely. Increased knee pain often results from poorly placed feet or knees.  Lean against a smooth wall or door and walk your feet out 18-24 inches. Place your feet hip-width apart.   Slowly slide down the wall or door until your knees bend __________ degrees.* Keep your knees over your heels, not your toes, and in line with your hips, not falling to either side.   Hold for __________ seconds. Stand up to rest for __________ seconds in  between each repetition.  Repeat __________ times. Complete this exercise __________ times per day. * Your physician, physical therapist or athletic trainer will alter this angle based on your symptoms and progress. Document Released: 01/23/2005 Document Revised: 11/21/2010 Document Reviewed: 06/23/2008 [facility] Patient Information 2012 [place], [place].

## 2011-04-11 ENCOUNTER — Ambulatory Visit (INDEPENDENT_AMBULATORY_CARE_PROVIDER_SITE_OTHER): Payer: Self-pay | Admitting: Sports Medicine

## 2011-04-11 DIAGNOSIS — M1710 Unilateral primary osteoarthritis, unspecified knee: Secondary | ICD-10-CM

## 2011-04-11 DIAGNOSIS — M1711 Unilateral primary osteoarthritis, right knee: Secondary | ICD-10-CM

## 2011-04-11 DIAGNOSIS — M25569 Pain in unspecified knee: Secondary | ICD-10-CM

## 2011-04-11 DIAGNOSIS — M25561 Pain in right knee: Secondary | ICD-10-CM

## 2011-04-11 DIAGNOSIS — M170 Bilateral primary osteoarthritis of knee: Secondary | ICD-10-CM | POA: Insufficient documentation

## 2011-04-11 MED ORDER — MELOXICAM 15 MG PO TABS: 15.0000 mg | ORAL_TABLET | Freq: Every day | ORAL | Status: DC

## 2011-04-11 NOTE — Patient Instructions (Signed)
Increase tramadol to three times per day  Start meloxicam once daily  Try stationary bike to keep your legs strong, and for cardiovascular health  Follow up with Dr. Tye Savoy

## 2011-04-11 NOTE — Progress Notes (Signed)
Pt here today for her R knee pain that has been ongoing since August, when she fell in her driveway while it was wet. She had significant swelling after the fall.  The pain seems to be on the lateral aspect of the knee. She continues to have mild swelling.  Does have pain at night, and it does cause sleep problems.  Taking tramadol bid, which she says does not help her pain.  She had x-rays taken of the right knee after the injury and they were read as mild osteoarthritis. I have reviewed these x-rays and I think they show more significant patellofemoral arthritis that is at least moderate. On the tibial side there is spurring anteriorly and posteriorly this probably preventing some of her  flexion of the knee   O/ NAD Obese female  Rt knee exam: No effusion or warmth today rt knee  Crepitation with patellar movement R>L 90 deg of flexion before pain starts on rt Ligaments stable med and lat 60 deg knee flexion with standing with significant pain She does get pain with McMurray testing but this seems more related to flexion of the knee and pain behind the patella itself

## 2011-04-11 NOTE — Assessment & Plan Note (Signed)
I think we should start her regular Mobic 15 mg daily since she has fairly significant arthritis  Also use a compression sleeve to see if this prevents some swelling and lessens pain  Increase the tramadol 3 times a day

## 2011-04-11 NOTE — Assessment & Plan Note (Signed)
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 Tye Savoy in one month. If these are helping she should continue as. She has significant effusions we can always use cortisone injections but I would not use these unless the pain and swelling a significant.

## 2011-05-10 ENCOUNTER — Encounter: Payer: Self-pay | Admitting: Family Medicine

## 2011-05-10 ENCOUNTER — Ambulatory Visit (INDEPENDENT_AMBULATORY_CARE_PROVIDER_SITE_OTHER): Payer: Self-pay | Admitting: Family Medicine

## 2011-05-10 VITALS — BP 168/114 | HR 93 | Temp 98.6°F | Ht 68.6 in | Wt 227.2 lb

## 2011-05-10 DIAGNOSIS — M25569 Pain in unspecified knee: Secondary | ICD-10-CM

## 2011-05-10 DIAGNOSIS — M25561 Pain in right knee: Secondary | ICD-10-CM

## 2011-05-10 MED ORDER — TRAMADOL HCL 50 MG PO TABS: 50.0000 mg | ORAL_TABLET | Freq: Three times a day (TID) | ORAL | Status: DC | PRN

## 2011-05-10 MED ORDER — HYDROCODONE-ACETAMINOPHEN 5-325 MG PO TABS: 1.0000 | ORAL_TABLET | Freq: Three times a day (TID) | ORAL | Status: DC | PRN

## 2011-05-10 MED ORDER — KETOROLAC TROMETHAMINE 60 MG/2ML IM SOLN
60.0000 mg | Freq: Once | INTRAMUSCULAR | Status: AC
  Administered 2011-05-10: 60 mg via INTRAMUSCULAR

## 2011-05-10 NOTE — Patient Instructions (Signed)
It was good to see you again today. Continue taking Mobic and Tramadol for pain. If pain becomes severe, you may take Vicodin. We will try this for one month and then re-evaluate treatment options. Continue to exercise, walk, and lose weight.  Keep up the good work. Schedule follow up appointment with Dr. Darrick Penna and me in one month.

## 2011-05-10 NOTE — Progress Notes (Signed)
  Subjective:    Patient ID: Shannon Stuart, female    DOB: 1960-07-18, 51 y.o.   MRN: [id number]  HPI  Patient presents to clinic for follow up arthritis, right knee.   Patient was recently seen at sports medicine clinic. Patient says that right knee pain is unchanged. She has been doing the simple knee exercises, wearing a knee brace, and walking daily. She tried to get on a stationary bike at the gym but this was too painful on her knee. Patient states that the knee brace relieved his pain while she is at school walking around, however when she gets home and takes off the brace, her knee swelled up and throbs. Pain is not well controlled on Mobic and tramadol. She says that at times pain is so severe, it brings her to tears.  Denies any recent falls or trauma.   Review of Systems Per history of present illness.    Objective:   Physical Exam MSK:  Small effusion Right Knee, but no warmth or redness; crepitation with patellar movement, able to flex knee 90 degrees but very painful. Neuro: sensation intact.  Walks with a limp.       Assessment & Plan:

## 2011-05-10 NOTE — Assessment & Plan Note (Signed)
No significant improvement in pain control with Mobic and tramadol 3 times a day. Will start Vicodin 5-325 every 8 hours as needed for severe pain. Discussed with patient that I will not continue chronic narcotics for pain management. Schedule followup appointment with Dr. Darrick Penna and myself at [facility] next month.

## 2011-05-14 ENCOUNTER — Ambulatory Visit: Payer: Self-pay | Admitting: Sports Medicine

## 2011-05-23 ENCOUNTER — Telehealth: Payer: Self-pay | Admitting: Family Medicine

## 2011-05-23 NOTE — Telephone Encounter (Signed)
I have not been writing refills for Ambien.  I hesitate to give this to patient in addition to her Klonopin.  Please call patient back and ask her to schedule appointment with me if she wants to discuss insomnia further.

## 2011-05-23 NOTE — Telephone Encounter (Signed)
Need refill for Ambien and need permanent handicap placard for car.  Please call when both are ready.

## 2011-05-28 ENCOUNTER — Other Ambulatory Visit: Payer: Self-pay | Admitting: Family Medicine

## 2011-05-28 NOTE — Telephone Encounter (Signed)
Refill request

## 2011-05-29 ENCOUNTER — Telehealth: Payer: Self-pay | Admitting: Family Medicine

## 2011-05-29 NOTE — Telephone Encounter (Signed)
See routing message

## 2011-05-30 NOTE — Telephone Encounter (Signed)
Spoke with patient and informed her of script and form up front to be picked up. She was also wanting her ambien filled

## 2011-05-30 NOTE — Telephone Encounter (Signed)
Please let patient know that I do not have ambien on her medication list.  Did she get it from another physician?  If she is having difficulty sleeping, I will need to see her to discuss this.  In the meantime, she can take Klonopin for sleep.

## 2011-06-07 ENCOUNTER — Ambulatory Visit (INDEPENDENT_AMBULATORY_CARE_PROVIDER_SITE_OTHER): Payer: Self-pay | Admitting: Family Medicine

## 2011-06-07 ENCOUNTER — Encounter: Payer: Self-pay | Admitting: Family Medicine

## 2011-06-07 VITALS — BP 140/100 | HR 76 | Temp 98.2°F | Ht 68.0 in | Wt 230.0 lb

## 2011-06-07 DIAGNOSIS — Z309 Encounter for contraceptive management, unspecified: Secondary | ICD-10-CM

## 2011-06-07 DIAGNOSIS — I10 Essential (primary) hypertension: Secondary | ICD-10-CM

## 2011-06-07 DIAGNOSIS — M1711 Unilateral primary osteoarthritis, right knee: Secondary | ICD-10-CM

## 2011-06-07 DIAGNOSIS — M1710 Unilateral primary osteoarthritis, unspecified knee: Secondary | ICD-10-CM

## 2011-06-07 LAB — BASIC METABOLIC PANEL
BUN: 13 mg/dL (ref 6–23)
CO2: 29 mEq/L (ref 19–32)
Calcium: 9 mg/dL (ref 8.4–10.5)
Chloride: 103 mEq/L (ref 96–112)
Creat: 1.16 mg/dL — ABNORMAL HIGH (ref 0.50–1.10)
Glucose, Bld: 107 mg/dL — ABNORMAL HIGH (ref 70–99)
Potassium: 3.8 mEq/L (ref 3.5–5.3)
Sodium: 141 mEq/L (ref 135–145)

## 2011-06-07 LAB — CBC
HCT: 38.9 % (ref 36.0–46.0)
Hemoglobin: 12.3 g/dL (ref 12.0–15.0)
MCH: 28 pg (ref 26.0–34.0)
MCHC: 31.6 g/dL (ref 30.0–36.0)
MCV: 88.4 fL (ref 78.0–100.0)
Platelets: 234 10*3/uL (ref 150–400)
RBC: 4.4 MIL/uL (ref 3.87–5.11)
RDW: 16.1 % — ABNORMAL HIGH (ref 11.5–15.5)
WBC: 7.4 10*3/uL (ref 4.0–10.5)

## 2011-06-07 MED ORDER — MELOXICAM 15 MG PO TABS: 15.0000 mg | ORAL_TABLET | Freq: Every day | ORAL | Status: DC

## 2011-06-07 NOTE — Assessment & Plan Note (Signed)
Right knee pain, unchanged. Now affecting low back. Continue tramadol 100 mg 3 times daily. Start Mobic 15 mg daily x10 days. Patient never filled this in January. Continue Vicodin as needed for severe pain. Continue walking every day and uses stationary bike as tolerated. Wear a knee sleeve only with walking or exercising, take off while resting. Will order CBC and  electrolytes today to check kidney function.  Patient to followup with Dr. Darrick Penna on Tuesday morning.

## 2011-06-07 NOTE — Patient Instructions (Addendum)
It was good to see you today Shannon Stuart. I will discuss lab results at next visit. Only wear a knee sleeve while you are walking or exercising. Take off while you are resting. Pick up Mobic at your pharmacy. Take one tablet daily for 10 days. I will see you next Tuesday.

## 2011-06-07 NOTE — Assessment & Plan Note (Signed)
BP elevated today, but patient just took her medications 30 minutes ago. Will continue current regimen and recheck BP on Tuesday.

## 2011-06-07 NOTE — Assessment & Plan Note (Signed)
>>  ASSESSMENT AND PLAN FOR HYPERTENSION, BENIGN ESSENTIAL WRITTEN ON 06/07/2011 11:34 AM BY DE LA CRUZ, IVY, DO  BP elevated today, but patient just took her medications 30 minutes ago. Will continue current regimen and recheck BP on Tuesday.

## 2011-06-07 NOTE — Progress Notes (Addendum)
  Subjective:    Patient ID: Shannon Stuart, female    DOB: June 16, 1960, 50 y.o.   MRN: [id number]  HPI  Patient presents to clinic for followup right knee arthritis. Patient says that knee pain is unchanged. Now the pain is affecting her low back. Current regimen includes tramadol 100 mg 3 times daily. She also takes Vicodin as needed for severe pain, about 2-3 times per week. This helps patient sleep at night when pain is at its worst. She continues to wear the knee sleeve all day long. However, after she takes off the sleeve at night, or her knee pain worsens and swells up. Patient walks on campus every day. She tried using a stationary bike but this was too painful on her knees.  Patient has a followup appointment at sports medicine clinic this Tuesday.  Patient requesting Toradol injection and Depo shot today.  Toradol seems to improve pain for several hours.   Review of Systems Per history of present illness    Objective:   Physical Exam  Knee: Normal to inspection with no erythema or effusion or obvious bony abnormalities. Pain on palpation joint line tenderness and patellar tenderness.  No warmth. ROM limited in flexion.  Full extension, but painful. Ligaments with solid consistent endpoints including ACL, PCL, LCL, MCL. Hamstring and quadriceps strength is weak on right.    Assessment & Plan:

## 2011-06-10 MED ORDER — KETOROLAC TROMETHAMINE 60 MG/2ML IM SOLN
60.0000 mg | Freq: Once | INTRAMUSCULAR | Status: AC
  Administered 2011-06-10: 60 mg via INTRAMUSCULAR

## 2011-06-10 MED ORDER — MEDROXYPROGESTERONE ACETATE 150 MG/ML IM SUSP
150.0000 mg | Freq: Once | INTRAMUSCULAR | Status: AC
  Administered 2011-06-10: 150 mg via INTRAMUSCULAR

## 2011-06-10 NOTE — Progress Notes (Signed)
Addended by: Deno Etienne on: 06/10/2011 12:25 PM   Modules accepted: Orders

## 2011-06-18 ENCOUNTER — Ambulatory Visit: Payer: Self-pay | Admitting: Sports Medicine

## 2011-06-27 ENCOUNTER — Ambulatory Visit (INDEPENDENT_AMBULATORY_CARE_PROVIDER_SITE_OTHER): Payer: Self-pay | Admitting: Sports Medicine

## 2011-06-27 VITALS — BP 130/88

## 2011-06-27 DIAGNOSIS — M1710 Unilateral primary osteoarthritis, unspecified knee: Secondary | ICD-10-CM

## 2011-06-27 DIAGNOSIS — M1711 Unilateral primary osteoarthritis, right knee: Secondary | ICD-10-CM

## 2011-06-27 NOTE — Progress Notes (Signed)
Subjective:    Patient ID: Shannon Stuart, female    DOB: 15-Aug-1960, 51 y.o.   MRN: [id number]  HPI  Shannon Stuart is a pleasant 2 years of patient with a followup on her right knee pain. She was seen back in January and started on tramadol 50 mg by mouth 3 times a day, meloxicam 15 mg by mouth daily, quad strengthening exercises and knee sleeve. He stated that she is still having pain in the right knee, but pain is 5/10 in intensity, located in the anteromedial aspect of the knee appeared to have a hard time walking down the steps and getting up from a chair from a sitting position due to the knee pain. She is very and swelling on and off. The numbness no tingling no instability. She denies any injury to her right knee. She was given Vicodin by her PCP for pain control which has helped her the. She had knee x-rays back on August which were not weightbearing, showing mild medial DJD and patellofemoral DJD.  Past Medical History  Diagnosis Date  . Allergy   . Anxiety   . Depression   . Hyperlipidemia   . Hypertension   . Endometriosis    Current Outpatient Prescriptions on File Prior to Visit  Medication Sig Dispense Refill  . clonazePAM (KLONOPIN) 1 MG tablet TAKE ONE TABLET BY MOUTH EVERY 12 HOURS AS NEEDED FOR ANXIETY  60 tablet  4  . cyclobenzaprine (FLEXERIL) 10 MG tablet Take 1 tablet (10 mg total) by mouth 3 (three) times daily. For back pain  90 tablet  5  . HYDROcodone-acetaminophen (NORCO) 5-325 MG per tablet Take 1 tablet by mouth every 8 (eight) hours as needed for pain.  90 tablet  0  . ibuprofen (ADVIL,MOTRIN) 800 MG tablet Take 1 tablet (800 mg total) by mouth every 8 (eight) hours as needed.  90 tablet  3  . lisinopril-hydrochlorothiazide (PRINZIDE,ZESTORETIC) 20-25 MG per tablet Take 1 tablet by mouth daily.  90 tablet  5  . loratadine (CLARITIN) 10 MG tablet Take 1 tablet (10 mg total) by mouth daily.  30 tablet  5  . meloxicam (MOBIC) 15 MG tablet Take 1 tablet (15 mg total)  by mouth daily.  10 tablet  0  . metoprolol tartrate (LOPRESSOR) 25 MG tablet Take 1 tablet (25 mg total) by mouth 2 (two) times daily.  60 tablet  5  . metoprolol tartrate (LOPRESSOR) 25 MG tablet Take 25 mg by mouth 2 (two) times daily.      . pantoprazole (PROTONIX) 40 MG tablet Take 1 tablet (40 mg total) by mouth 2 (two) times daily.  60 tablet  5  . traMADol (ULTRAM) 50 MG tablet Take 1 tablet (50 mg total) by mouth every 8 (eight) hours as needed for pain. Maximum dose= 8 tablets per day  90 tablet  3   No Known Allergies    Review of Systems  Constitutional: Negative for fever, chills, diaphoresis and fatigue.  Musculoskeletal: Negative for back pain, joint swelling, arthralgias and gait problem.  Neurological: Negative for weakness and numbness.       Objective:   Physical Exam  Constitutional: She is oriented to person, place, and time. She appears well-developed and well-nourished.       BP 130/88   Pulmonary/Chest: Effort normal.  Musculoskeletal:       Right knee with intact skin, decreased ROM  -5 extension, 90 flexion. Severe Patellofemoral crepitus present with flexion and extension. Patellofemoral compression  test +. No tenderness on the quad neither or patellar tendon. TTP in the mid and lateral join line. There is a knee effusion. Ligaments intact. Lachman neg. Varus and valgus test at 0 and 30 degres neg Poor quad muscle definition.  Gait  Independent  with a limp favoring the left side.      Neurological: She is alert and oriented to person, place, and time.  Skin: Skin is warm. No rash noted. No erythema.  Psychiatric: She has a normal mood and affect. Her behavior is normal. Judgment and thought content normal.    After obtaining consent, the skin of the supero lateral knee was sterilely prepped with povidone and  alcohol swabs, ethyl will chloride was used for local topical anesthesia, aspiration of 10 ml of synovial fluid was done. Intraarticular  injection of 6 mL 1% lidocaine plus 2 mL of Kenalog was injected in the right knee.  The procedure was well-tolerated by the patient. Patient instructed to remain in clinic for 20 minutes afterwards, and to report any adverse reaction to me immediately.        Assessment & Plan:   1. Arthritis of knee, right  DG Knee Bilateral Standing AP, DG Knee AP/LAT W/Sunrise Right   Continue HEP for quad strengthening Ice 20 min po bid Continue motrin prn for pain control Repeat weight bearing knee x ray  F/U in 4 weeks.

## 2011-07-15 ENCOUNTER — Emergency Department (HOSPITAL_COMMUNITY)
Admission: EM | Admit: 2011-07-15 | Discharge: 2011-07-15 | Disposition: A | Discharge status: Home / Self Care | Payer: Self-pay | Attending: Emergency Medicine | Admitting: Emergency Medicine

## 2011-07-15 ENCOUNTER — Encounter (HOSPITAL_COMMUNITY): Payer: Self-pay | Admitting: Emergency Medicine

## 2011-07-15 ENCOUNTER — Emergency Department (HOSPITAL_COMMUNITY): Payer: Self-pay

## 2011-07-15 DIAGNOSIS — I10 Essential (primary) hypertension: Secondary | ICD-10-CM | POA: Insufficient documentation

## 2011-07-15 DIAGNOSIS — E785 Hyperlipidemia, unspecified: Secondary | ICD-10-CM | POA: Insufficient documentation

## 2011-07-15 DIAGNOSIS — F341 Dysthymic disorder: Secondary | ICD-10-CM | POA: Insufficient documentation

## 2011-07-15 DIAGNOSIS — R1110 Vomiting, unspecified: Secondary | ICD-10-CM | POA: Insufficient documentation

## 2011-07-15 DIAGNOSIS — R109 Unspecified abdominal pain: Secondary | ICD-10-CM | POA: Insufficient documentation

## 2011-07-15 DIAGNOSIS — Z79899 Other long term (current) drug therapy: Secondary | ICD-10-CM | POA: Insufficient documentation

## 2011-07-15 DIAGNOSIS — R197 Diarrhea, unspecified: Secondary | ICD-10-CM | POA: Insufficient documentation

## 2011-07-15 LAB — DIFFERENTIAL
Basophils Absolute: 0 10*3/uL (ref 0.0–0.1)
Basophils Relative: 0 % (ref 0–1)
Eosinophils Absolute: 0.1 10*3/uL (ref 0.0–0.7)
Eosinophils Relative: 1 % (ref 0–5)
Lymphocytes Relative: 29 % (ref 12–46)
Lymphs Abs: 3.1 10*3/uL (ref 0.7–4.0)
Monocytes Absolute: 0.5 10*3/uL (ref 0.1–1.0)
Monocytes Relative: 5 % (ref 3–12)
Neutro Abs: 7.1 10*3/uL (ref 1.7–7.7)
Neutrophils Relative %: 65 % (ref 43–77)

## 2011-07-15 LAB — URINALYSIS, ROUTINE W REFLEX MICROSCOPIC
Bilirubin Urine: NEGATIVE
Glucose, UA: NEGATIVE mg/dL
Ketones, ur: 15 mg/dL — AB
Nitrite: NEGATIVE
Protein, ur: NEGATIVE mg/dL
Specific Gravity, Urine: 1.017 (ref 1.005–1.030)
Urobilinogen, UA: 0.2 mg/dL (ref 0.0–1.0)
pH: 7.5 (ref 5.0–8.0)

## 2011-07-15 LAB — CBC
HCT: 42 % (ref 36.0–46.0)
Hemoglobin: 13.6 g/dL (ref 12.0–15.0)
MCH: 27.9 pg (ref 26.0–34.0)
MCHC: 32.4 g/dL (ref 30.0–36.0)
MCV: 86.1 fL (ref 78.0–100.0)
Platelets: 207 10*3/uL (ref 150–400)
RBC: 4.88 MIL/uL (ref 3.87–5.11)
RDW: 16.4 % — ABNORMAL HIGH (ref 11.5–15.5)
WBC: 10.8 10*3/uL — ABNORMAL HIGH (ref 4.0–10.5)

## 2011-07-15 LAB — URINE MICROSCOPIC-ADD ON

## 2011-07-15 LAB — COMPREHENSIVE METABOLIC PANEL
ALT: 24 U/L (ref 0–35)
AST: 23 U/L (ref 0–37)
Albumin: 3.9 g/dL (ref 3.5–5.2)
Alkaline Phosphatase: 68 U/L (ref 39–117)
BUN: 17 mg/dL (ref 6–23)
CO2: 19 mEq/L (ref 19–32)
Calcium: 9.1 mg/dL (ref 8.4–10.5)
Chloride: 101 mEq/L (ref 96–112)
Creatinine, Ser: 0.9 mg/dL (ref 0.50–1.10)
GFR calc Af Amer: 83 mL/min — ABNORMAL LOW (ref >90)
GFR calc non Af Amer: 72 mL/min — ABNORMAL LOW (ref >90)
Glucose, Bld: 117 mg/dL — ABNORMAL HIGH (ref 70–99)
Potassium: 3.6 mEq/L (ref 3.5–5.1)
Sodium: 135 mEq/L (ref 135–145)
Total Bilirubin: 0.4 mg/dL (ref 0.3–1.2)
Total Protein: 7.3 g/dL (ref 6.0–8.3)

## 2011-07-15 LAB — LIPASE, BLOOD: Lipase: 29 U/L (ref 11–59)

## 2011-07-15 LAB — PREGNANCY, URINE: Preg Test, Ur: NEGATIVE

## 2011-07-15 MED ORDER — PROMETHAZINE HCL 25 MG RE SUPP: 25.0000 mg | Freq: Four times a day (QID) | RECTAL | Status: DC | PRN

## 2011-07-15 MED ORDER — HYDROMORPHONE HCL PF 1 MG/ML IJ SOLN
1.0000 mg | Freq: Once | INTRAMUSCULAR | Status: AC
  Administered 2011-07-15: 1 mg via INTRAVENOUS
  Filled 2011-07-15: qty 1

## 2011-07-15 MED ORDER — SODIUM CHLORIDE 0.9 % IV SOLN: 1000.0000 mL | INTRAVENOUS | Status: DC

## 2011-07-15 MED ORDER — IOHEXOL 300 MG/ML  SOLN
100.0000 mL | Freq: Once | INTRAMUSCULAR | Status: AC | PRN
  Administered 2011-07-15: 100 mL via INTRAVENOUS

## 2011-07-15 MED ORDER — SODIUM CHLORIDE 0.9 % IV SOLN
1000.0000 mL | Freq: Once | INTRAVENOUS | Status: AC
  Administered 2011-07-15: 1000 mL via INTRAVENOUS

## 2011-07-15 MED ORDER — ONDANSETRON HCL 4 MG/2ML IJ SOLN
4.0000 mg | Freq: Once | INTRAMUSCULAR | Status: AC
  Administered 2011-07-15: 4 mg via INTRAVENOUS
  Filled 2011-07-15: qty 2

## 2011-07-15 MED ORDER — ONDANSETRON HCL 4 MG/2ML IJ SOLN
INTRAMUSCULAR | Status: AC
  Filled 2011-07-15: qty 2

## 2011-07-15 MED ORDER — HYDROCODONE-ACETAMINOPHEN 5-325 MG PO TABS: 1.0000 | ORAL_TABLET | Freq: Three times a day (TID) | ORAL | Status: DC | PRN

## 2011-07-15 MED ORDER — PROMETHAZINE HCL 25 MG/ML IJ SOLN
12.5000 mg | Freq: Once | INTRAMUSCULAR | Status: AC
  Administered 2011-07-15: 12.5 mg via INTRAVENOUS
  Filled 2011-07-15: qty 1

## 2011-07-15 NOTE — ED Notes (Signed)
MD at bedside. 

## 2011-07-15 NOTE — ED Notes (Signed)
Pt. Returned from CT.

## 2011-07-15 NOTE — ED Notes (Signed)
Waiting for sister to return with her clothes.

## 2011-07-15 NOTE — Discharge Instructions (Signed)

## 2011-07-15 NOTE — ED Notes (Signed)
Discharged per W/C with sister. Reviewed Rx for Vicodin.

## 2011-07-15 NOTE — ED Notes (Signed)
Patient transported to CT 

## 2011-07-15 NOTE — ED Notes (Signed)
Phenergan diluted in 10cc NS and given over at least 3 min.

## 2011-07-15 NOTE — ED Notes (Signed)
Pt c/o severe nausea. Reported to Dr. Lynelle Doctor

## 2011-07-15 NOTE — ED Notes (Signed)
[id number]<ON> Expected date:<BR> Expected time:<BR> Means of arrival:<BR> Comments:<BR> Ems/ n/v

## 2011-07-15 NOTE — ED Notes (Signed)
IV inserted by EMS PTA

## 2011-07-15 NOTE — ED Notes (Signed)
Pt leaning her head through the bed rails, vomiting on floor. States is unable to sit back up in bed. Continues to vomit on floor. Emesis is clear.

## 2011-07-15 NOTE — ED Provider Notes (Signed)
History     CSN: [id number]  Arrival date & time 07/15/11  9147   First MD Initiated Contact with Patient 07/15/11 [phone]      Chief Complaint  Patient presents with  . Emesis     location/radiation/quality/duration/timing/severity/associated sxs/prior treatment) HPI Comments: Patient states the symptoms all started with diarrhea. She's had greater than 10 episodes since it started. Vomiting and abdominal pain started sometime after that yesterday. Patient states she has had numerous episodes and has not been able to keep anything down. She is also developed diffuse abdominal pain in her upper and lower abdomen. Patient denies any fevers or coughing. She denies any dysuria but she has been urinating frequently. Patient states she has history of gallbladder problems as well as uterus problems. She's been told she needs to have a hysterectomy and a cholecystectomy. She has not had either of those procedures done yet. She's not sure if this episode is similar to previous episodes. The pain gets worse whenever she tries to keep drink or move at all. She currently describes the pain as very severe.  Patient is a 51 y.o. female presenting with vomiting. The history is provided by the patient.  Emesis  This is a new problem. The current episode started yesterday. The problem occurs more than 10 times per day. The problem has been gradually worsening. The emesis has an appearance of stomach contents. There has been no fever. Associated symptoms include abdominal pain and diarrhea. Pertinent negatives include no cough, no fever, no sweats and no URI.    Past Medical History  Diagnosis Date  . Allergy   . Anxiety   . Depression   . Hyperlipidemia   . Hypertension   . Endometriosis     No past surgical history on file.  No family history on file.  History  Substance Use Topics  . Smoking status: Never Smoker   . Smokeless tobacco: Not on file  . Alcohol Use: Yes     'SOCIALLY"    OB  History    Grav Para Term Preterm Abortions TAB SAB Ect Mult Living                  Review of Systems  Constitutional: Negative for fever.  Respiratory: Negative for cough.   Gastrointestinal: Positive for vomiting, abdominal pain and diarrhea.  All other systems reviewed and are negative.    Allergies  Review of patient's allergies indicates no known allergies.  Home Medications   Current Outpatient Rx  Name Route Sig Dispense Refill  . CLONAZEPAM 1 MG PO TABS  TAKE ONE TABLET BY MOUTH EVERY 12 HOURS AS NEEDED FOR ANXIETY 60 tablet 4  . CYCLOBENZAPRINE HCL 10 MG PO TABS Oral Take 1 tablet (10 mg total) by mouth 3 (three) times daily. For back pain 90 tablet 5  . HYDROCODONE-ACETAMINOPHEN 5-325 MG PO TABS Oral Take 1 tablet by mouth every 8 (eight) hours as needed for pain. 90 tablet 0  . IBUPROFEN 800 MG PO TABS Oral Take 1 tablet (800 mg total) by mouth every 8 (eight) hours as needed. 90 tablet 3  . LISINOPRIL-HYDROCHLOROTHIAZIDE 20-25 MG PO TABS Oral Take 1 tablet by mouth daily. 90 tablet 5  . LORATADINE 10 MG PO TABS Oral Take 1 tablet (10 mg total) by mouth daily. 30 tablet 5  . MELOXICAM 15 MG PO TABS Oral Take 1 tablet (15 mg total) by mouth daily. 10 tablet 0  . METOPROLOL TARTRATE 25 MG PO TABS  Oral Take 1 tablet (25 mg total) by mouth 2 (two) times daily. 60 tablet 5  . METOPROLOL TARTRATE 25 MG PO TABS Oral Take 25 mg by mouth 2 (two) times daily.    Marland Kitchen PANTOPRAZOLE SODIUM 40 MG PO TBEC Oral Take 1 tablet (40 mg total) by mouth 2 (two) times daily. 60 tablet 5  . TRAMADOL HCL 50 MG PO TABS Oral Take 1 tablet (50 mg total) by mouth every 8 (eight) hours as needed for pain. Maximum dose= 8 tablets per day 90 tablet 3    BP 135/85  Pulse 68  Temp(Src) 97.3 F (36.3 C) (Oral)  Resp 26  SpO2 100%  LMP 05/17/2011  Physical Exam  Nursing note and vitals reviewed. Constitutional: She appears well-developed. She appears distressed.  HENT:  Head: Normocephalic and  atraumatic.  Right Ear: External ear normal.  Left Ear: External ear normal.  Eyes: Conjunctivae are normal. Right eye exhibits no discharge. Left eye exhibits no discharge. No scleral icterus.  Neck: Neck supple. No tracheal deviation present.  Cardiovascular: Normal rate, regular rhythm and intact distal pulses.   Pulmonary/Chest: Effort normal and breath sounds normal. No stridor. No respiratory distress. She has no wheezes. She has no rales.  Abdominal: Soft. She exhibits no distension. Bowel sounds are decreased. There is generalized tenderness. There is guarding. There is no rebound and no CVA tenderness. No hernia.  Musculoskeletal: She exhibits no edema and no tenderness.  Neurological: She is alert. She has normal strength. No sensory deficit. Cranial nerve deficit:  no gross defecits noted. She exhibits normal muscle tone. She displays no seizure activity. Coordination normal.  Skin: Skin is warm and dry. No rash noted.  Psychiatric: She has a normal mood and affect.    ED Course  Procedures (including critical care time)  Medications  ondansetron (ZOFRAN) 4 MG/2ML injection (not administered)  0.9 %  sodium chloride infusion (1000 mL Intravenous New Bag/Given 07/15/11 1018)    Followed by  0.9 %  sodium chloride infusion (not administered)  omeprazole (PRILOSEC) 20 MG capsule (not administered)  HYDROcodone-acetaminophen (NORCO) 5-325 MG per tablet (not administered)  HYDROcodone-acetaminophen (NORCO) 5-325 MG per tablet (not administered)  promethazine (PHENERGAN) 25 MG suppository (not administered)  HYDROmorphone (DILAUDID) injection 1 mg (1 mg Intravenous Given 07/15/11 1012)  ondansetron (ZOFRAN) injection 4 mg (4 mg Intravenous Given 07/15/11 1012)  promethazine (PHENERGAN) injection 12.5 mg (12.5 mg Intravenous Given 07/15/11 1219)  HYDROmorphone (DILAUDID) injection 1 mg (1 mg Intravenous Given 07/15/11 1301)  iohexol (OMNIPAQUE) 300 MG/ML solution 100 mL (100 mL  Intravenous Contrast Given 07/15/11 1527)    Labs Reviewed  CBC - Abnormal; Notable for the following:    WBC 10.8 (*)    RDW 16.4 (*)    All other components within normal limits  COMPREHENSIVE METABOLIC PANEL - Abnormal; Notable for the following:    Glucose, Bld 117 (*)    GFR calc non Af Amer 72 (*)    GFR calc Af Amer 83 (*)    All other components within normal limits  URINALYSIS, ROUTINE W REFLEX MICROSCOPIC - Abnormal; Notable for the following:    Hgb urine dipstick SMALL (*)    Ketones, ur 15 (*)    Leukocytes, UA SMALL (*)    All other components within normal limits  URINE MICROSCOPIC-ADD ON - Abnormal; Notable for the following:    Squamous Epithelial / LPF FEW (*)    Bacteria, UA FEW (*)    All other  components within normal limits  DIFFERENTIAL  LIPASE, BLOOD  PREGNANCY, URINE   Ct Abdomen Pelvis W Contrast  07/15/2011  *RADIOLOGY REPORT*  Clinical Data: Diffuse abdominal pain with nausea, vomiting and diarrhea.  CT ABDOMEN AND PELVIS WITH CONTRAST  Technique:  Multidetector CT imaging of the abdomen and pelvis was performed following the standard protocol during bolus administration of intravenous contrast.  Contrast:  100 ml Omnipaque-300.  Comparison: 08/01/2009.  Findings: Lung bases show no acute findings.  Heart size normal. No pericardial or pleural effusion.  Liver, gallbladder and adrenal glands are unremarkable.  Low attenuation lesions in the kidneys are sub centimeter in size. Spleen, pancreas, stomach and bowel, including appendix, are unremarkable.  Probable fibroids in the uterus.  Ovaries are visualized.  No pathologically enlarged lymph nodes.  No free fluid.  Minimal scattered atherosclerotic calcification of the arterial vasculature.  No worrisome lytic or sclerotic lesions. Degenerative changes are seen in the spine.  There are postoperative changes at the L4-5 level.  IMPRESSION:  1.  No findings to explain the patient's given symptoms. 2.  Probable  uterine fibroids.  Original Report Authenticated By: Reyes Ivan, M.D.     1. Abdominal pain   2. Vomiting       MDM  Pt is feeling better now.   CT without acute findings.  Doubt that her fibroids are related to her symptoms.  Will dc home on po pain meds and antiemetics.         Celene Kras, MD 07/15/11 [phone]

## 2011-07-15 NOTE — ED Notes (Signed)
Started last pm with N/V/D.  C/o pain in abdomen.

## 2011-08-30 ENCOUNTER — Encounter: Payer: Self-pay | Admitting: Family Medicine

## 2011-08-30 ENCOUNTER — Ambulatory Visit (INDEPENDENT_AMBULATORY_CARE_PROVIDER_SITE_OTHER): Payer: Self-pay | Admitting: Family Medicine

## 2011-08-30 VITALS — BP 140/80 | HR 78 | Temp 98.3°F | Ht 68.0 in | Wt 227.0 lb

## 2011-08-30 DIAGNOSIS — D369 Benign neoplasm, unspecified site: Secondary | ICD-10-CM | POA: Insufficient documentation

## 2011-08-30 DIAGNOSIS — M1711 Unilateral primary osteoarthritis, right knee: Secondary | ICD-10-CM

## 2011-08-30 DIAGNOSIS — N949 Unspecified condition associated with female genital organs and menstrual cycle: Secondary | ICD-10-CM

## 2011-08-30 DIAGNOSIS — M1710 Unilateral primary osteoarthritis, unspecified knee: Secondary | ICD-10-CM

## 2011-08-30 DIAGNOSIS — M545 Low back pain, unspecified: Secondary | ICD-10-CM

## 2011-08-30 DIAGNOSIS — I10 Essential (primary) hypertension: Secondary | ICD-10-CM

## 2011-08-30 DIAGNOSIS — N938 Other specified abnormal uterine and vaginal bleeding: Secondary | ICD-10-CM

## 2011-08-30 MED ORDER — DULOXETINE HCL 20 MG PO CPEP: 20.0000 mg | ORAL_CAPSULE | Freq: Every day | ORAL | Status: DC

## 2011-08-30 MED ORDER — HYDROCODONE-ACETAMINOPHEN 5-325 MG PO TABS: 1.0000 | ORAL_TABLET | Freq: Three times a day (TID) | ORAL | Status: DC | PRN

## 2011-08-30 MED ORDER — SULFAMETHOXAZOLE-TMP DS 800-160 MG PO TABS: 1.0000 | ORAL_TABLET | Freq: Two times a day (BID) | ORAL | Status: AC

## 2011-08-30 MED ORDER — KETOROLAC TROMETHAMINE 60 MG/2ML IM SOLN
60.0000 mg | Freq: Once | INTRAMUSCULAR | Status: AC
  Administered 2011-08-30: 60 mg via INTRAMUSCULAR

## 2011-08-30 NOTE — Assessment & Plan Note (Signed)
Cyst was red and painful with palpation. Will treat with Bactrim DS x 10 days to see if it improves. Follow up in 1 month, if no improvement and still irritating, consider removal in Procedure Clinic.

## 2011-08-30 NOTE — Patient Instructions (Signed)
Start taking Cymbalta 20 mg daily. I sent your Vicodin to your pharmacy and take only as needed. For abscess/cyst on back, start taking the antibiotic for 14 days. [facility]. Your BP is well controlled. Continue current medications. Return to clinic in 1 month for follow-up.

## 2011-08-30 NOTE — Assessment & Plan Note (Signed)
>>  ASSESSMENT AND PLAN FOR HYPERTENSION, BENIGN ESSENTIAL WRITTEN ON 08/30/2011  4:35 PM BY DE LA CRUZ, IVY, DO  BP slightly elevated 140/80, may be secondary to pain. Continue current regimen and repeat in 1 month.

## 2011-08-30 NOTE — Assessment & Plan Note (Signed)
Uterine fibroids present on recent CT abdomen from an ED visit. Patient's heavy bleeding improved after Depo shots, but she cannot afford it at this time. Will refer to GYN to discuss surgical options.

## 2011-08-30 NOTE — Progress Notes (Signed)
  Subjective:    Patient ID: Shannon Stuart, female    DOB: March 28, 1960, 51 y.o.   MRN: [id number]  HPI  Chronic pain, RT knee: Pain is worse today.  She states she has been in bed for the last week due to both RT knee and low back pain.  She is doing the exercises per Dr. Darrick Penna.  She takes Motrin, Tramadol daily and Norco one tablet per week (only when pain is severe).  She would like a Toradol shot today.  Denies any recent falls or numbness/tingling of lower extremities at this time.  Menorrhagia: Better controlled with Depo shots.  However, she is waiting for [facility] renewal and cannot afford Depo shot today.  She is interested in getting a hysterectomy for uterine fibroids, but has been putting it off due to knee pain.  Her bleeding occurs monthly and is not as heavy as before.  She would like to go back to GYN clinic to discuss surgery.  Cyst, back: patient noticed cyst on her back about 1 month ago.  She says it is tender on palpation.  Denies any active bleeding or drainage.  She just thinks it's annoying.  Denies any systemic symptoms - no fever, chills, Nausea or vomiting.   Review of Systems Per HPI    Objective:   Physical Exam  Constitutional: She is oriented to person, place, and time.  Cardiovascular: Normal rate, regular rhythm and normal heart sounds.   Pulmonary/Chest: Effort normal and breath sounds normal. She has no wheezes. She has no rales.  Musculoskeletal:       Right knee with intact skin, decreased ROM. Severe Patellofemoral crepitus present with flexion and extension.  There is a small knee effusion where it was last aspirated.     Neurological: She is alert and oriented to person, place, and time.  Skin:       Round, immobile, 1 cm wide cyst on upper back; mild erythema, tender on palpation, no induration or collection of pus.         Assessment & Plan:

## 2011-08-30 NOTE — Assessment & Plan Note (Signed)
BP slightly elevated 140/80, may be secondary to pain. Continue current regimen and repeat in 1 month.

## 2011-08-30 NOTE — Assessment & Plan Note (Signed)
Toradol 60 shot given today. Continue Motrin and Tramadol. Norco #30 tablets PRN pain. She is also followed by [facility] and I appreciate their recommendations. She is going to get an knee X-ray today. Follow up with me in 3 months.

## 2011-09-30 ENCOUNTER — Encounter: Payer: Self-pay | Admitting: Family Medicine

## 2011-09-30 ENCOUNTER — Ambulatory Visit (INDEPENDENT_AMBULATORY_CARE_PROVIDER_SITE_OTHER): Payer: Self-pay | Admitting: Family Medicine

## 2011-09-30 VITALS — BP 171/106 | HR 69 | Temp 97.2°F | Ht 68.0 in | Wt 229.6 lb

## 2011-09-30 DIAGNOSIS — Z309 Encounter for contraceptive management, unspecified: Secondary | ICD-10-CM

## 2011-09-30 DIAGNOSIS — I10 Essential (primary) hypertension: Secondary | ICD-10-CM

## 2011-09-30 DIAGNOSIS — M545 Low back pain, unspecified: Secondary | ICD-10-CM

## 2011-09-30 DIAGNOSIS — D369 Benign neoplasm, unspecified site: Secondary | ICD-10-CM

## 2011-09-30 LAB — CBC
HCT: 37.2 % (ref 36.0–46.0)
Hemoglobin: 12.6 g/dL (ref 12.0–15.0)
MCH: 28.4 pg (ref 26.0–34.0)
MCHC: 33.9 g/dL (ref 30.0–36.0)
MCV: 83.8 fL (ref 78.0–100.0)
Platelets: 257 10*3/uL (ref 150–400)
RBC: 4.44 MIL/uL (ref 3.87–5.11)
RDW: 16 % — ABNORMAL HIGH (ref 11.5–15.5)
WBC: 7.8 10*3/uL (ref 4.0–10.5)

## 2011-09-30 LAB — COMPREHENSIVE METABOLIC PANEL
ALT: 15 U/L (ref 0–35)
AST: 15 U/L (ref 0–37)
Albumin: 4.1 g/dL (ref 3.5–5.2)
Alkaline Phosphatase: 72 U/L (ref 39–117)
BUN: 15 mg/dL (ref 6–23)
CO2: 31 mEq/L (ref 19–32)
Calcium: 9 mg/dL (ref 8.4–10.5)
Chloride: 103 mEq/L (ref 96–112)
Creat: 1.1 mg/dL (ref 0.50–1.10)
Glucose, Bld: 104 mg/dL — ABNORMAL HIGH (ref 70–99)
Potassium: 3.8 mEq/L (ref 3.5–5.3)
Sodium: 140 mEq/L (ref 135–145)
Total Bilirubin: 0.4 mg/dL (ref 0.3–1.2)
Total Protein: 6.6 g/dL (ref 6.0–8.3)

## 2011-09-30 LAB — POCT URINE PREGNANCY: Preg Test, Ur: NEGATIVE

## 2011-09-30 MED ORDER — CLONAZEPAM 1 MG PO TABS: 1.0000 mg | ORAL_TABLET | Freq: Every day | ORAL | Status: DC | PRN

## 2011-09-30 MED ORDER — TRAMADOL HCL 50 MG PO TABS: 50.0000 mg | ORAL_TABLET | Freq: Three times a day (TID) | ORAL | Status: DC | PRN

## 2011-09-30 MED ORDER — ZOLPIDEM TARTRATE 10 MG PO TABS: 10.0000 mg | ORAL_TABLET | Freq: Every evening | ORAL | Status: DC | PRN

## 2011-09-30 MED ORDER — KETOROLAC TROMETHAMINE 30 MG/ML IM SOLN: 30.0000 mg | Freq: Once | INTRAMUSCULAR | Status: DC

## 2011-09-30 MED ORDER — KETOROLAC TROMETHAMINE 30 MG/ML IJ SOLN: 30.0000 mg | Freq: Once | INTRAMUSCULAR | Status: DC

## 2011-09-30 MED ORDER — KETOROLAC TROMETHAMINE 30 MG/ML IJ SOLN
30.0000 mg | Freq: Once | INTRAMUSCULAR | Status: AC
  Administered 2011-09-30: 30 mg via INTRAMUSCULAR

## 2011-09-30 MED ORDER — MEDROXYPROGESTERONE ACETATE 150 MG/ML IM SUSP
150.0000 mg | Freq: Once | INTRAMUSCULAR | Status: AC
  Administered 2011-09-30: 150 mg via INTRAMUSCULAR

## 2011-09-30 MED ORDER — HYDROCODONE-ACETAMINOPHEN 5-325 MG PO TABS: 1.0000 | ORAL_TABLET | Freq: Three times a day (TID) | ORAL | Status: DC | PRN

## 2011-09-30 MED ORDER — LISINOPRIL-HYDROCHLOROTHIAZIDE 20-25 MG PO TABS: 1.0000 | ORAL_TABLET | Freq: Every day | ORAL | Status: DC

## 2011-09-30 NOTE — Progress Notes (Signed)
  Subjective:    Patient ID: Shannon Stuart, female    DOB: January 11, 1961, 51 y.o.   MRN: [id number]  HPI  Patient presents to clinic for follow hypertension and cyst on back, Depo shot, and medication refills.  Hypertension: Patient's BP elevated today 171/106.  Patient is supposed to be taking Metoprolol 25 BID and Lisinopril-HCTZ 20-25 daily.  She has been out of the latter for about one month, while she waiting for orange card to be approved.  She also admits to not taking any BP medications today.  She denies any symptoms - no CP, SOB, fatigue, numbness/tingling of extremities, blurry vision, HA, or vomiting.  Dermoid cyst: Patient would like me to recheck cyst on back.  Since our last visit, she states that it drains pus intermittently.  Patient cannot see it since is located on upper back.  Denies any associated pain, fever, chills, nausea/vomiting.  She actually thinks it is getting smaller in size.  Still finds it annoying but in no rush for it to be removed.  Review of Systems  Per HPI    Objective:   Physical Exam  Constitutional: No distress.  Cardiovascular: Normal rate and regular rhythm.   No murmur heard. Pulmonary/Chest: Effort normal and breath sounds normal. She has no wheezes. She has no rales.  Skin:       Cystic lesion on LT side upper back; round, immobile, about 0.5 cm wide; no surrounding erythema, texture changes, no head where pus can drain from, no bruising.          Assessment & Plan:

## 2011-09-30 NOTE — Patient Instructions (Addendum)
It was great to see you today. I will call or send letter with results. Take your BP medications today! Schedule follow up appointment with Dr. Tye Savoy in 1-2 months or sooner as needed.

## 2011-09-30 NOTE — Assessment & Plan Note (Addendum)
Patient concerned about pus drainage that occurs intermittently. On exam, no evidence of infection or erythema.  Will continue to monitor. Patient to call if cyst becomes painful or if she develops fevers, chills, nausea/vomiting. If it becomes larger or bothersome, we can remove in Procedure clinic with Dr. Jennette Kettle in the future.

## 2011-09-30 NOTE — Assessment & Plan Note (Signed)
BP elevated today due to patient not taking medications today.  Patient asymptomatic, but I encouraged her to take BP medications before MD visit so I can ensure regimen is adequate. Will continue current regimen and repeat BP in 1-2 months.

## 2011-09-30 NOTE — Assessment & Plan Note (Signed)
>>  ASSESSMENT AND PLAN FOR HYPERTENSION, BENIGN ESSENTIAL WRITTEN ON 09/30/2011  2:25 PM BY DE LA CRUZ, IVY, DO  BP elevated today due to patient not taking medications today.  Patient asymptomatic, but I encouraged her to take BP medications before MD visit so I can ensure regimen is adequate. Will continue current regimen and repeat BP in 1-2 months.

## 2011-10-01 ENCOUNTER — Encounter: Payer: Self-pay | Admitting: Family Medicine

## 2011-10-01 ENCOUNTER — Ambulatory Visit (HOSPITAL_COMMUNITY)
Admission: RE | Admit: 2011-10-01 | Discharge: 2011-10-01 | Disposition: A | Discharge status: Home / Self Care | Payer: Self-pay | Source: Ambulatory Visit | Attending: Family Medicine | Admitting: Family Medicine

## 2011-10-01 DIAGNOSIS — M1710 Unilateral primary osteoarthritis, unspecified knee: Secondary | ICD-10-CM | POA: Insufficient documentation

## 2011-10-01 DIAGNOSIS — M25569 Pain in unspecified knee: Secondary | ICD-10-CM | POA: Insufficient documentation

## 2011-10-08 ENCOUNTER — Ambulatory Visit (HOSPITAL_COMMUNITY)
Admission: RE | Admit: 2011-10-08 | Discharge: 2011-10-08 | Disposition: A | Discharge status: Home / Self Care | Payer: Self-pay | Source: Ambulatory Visit | Attending: Family Medicine | Admitting: Family Medicine

## 2011-10-08 ENCOUNTER — Ambulatory Visit (INDEPENDENT_AMBULATORY_CARE_PROVIDER_SITE_OTHER): Payer: Self-pay | Admitting: Family Medicine

## 2011-10-08 ENCOUNTER — Encounter: Payer: Self-pay | Admitting: Family Medicine

## 2011-10-08 VITALS — BP 158/113 | HR 74 | Temp 98.7°F | Ht 68.0 in | Wt 221.0 lb

## 2011-10-08 DIAGNOSIS — R079 Chest pain, unspecified: Secondary | ICD-10-CM | POA: Insufficient documentation

## 2011-10-08 DIAGNOSIS — I10 Essential (primary) hypertension: Secondary | ICD-10-CM

## 2011-10-08 MED ORDER — METOPROLOL TARTRATE 25 MG PO TABS: 25.0000 mg | ORAL_TABLET | Freq: Two times a day (BID) | ORAL | Status: DC

## 2011-10-08 MED ORDER — NITROGLYCERIN 0.4 MG SL SUBL: 0.4000 mg | SUBLINGUAL_TABLET | SUBLINGUAL | Status: DC | PRN

## 2011-10-08 MED ORDER — CYCLOBENZAPRINE HCL 10 MG PO TABS: 10.0000 mg | ORAL_TABLET | Freq: Three times a day (TID) | ORAL | Status: DC | PRN

## 2011-10-08 NOTE — Assessment & Plan Note (Signed)
BP uncontrolled today 156/113.  Patient took Lisinopril-HCTZ, but has been out of Metoprolol for several days and needs refill today.  It is likely that chest discomfort, dizziness, and LT arm numbness is due to elevated BP.   Gave Rx for Metoprolol and advised patient to take twice today.  Return to clinic in one week to reassess symptoms and recheck BP.  EKG NSR, no ST abnormalities.

## 2011-10-08 NOTE — Progress Notes (Signed)
  Subjective:    Patient ID: Shannon Stuart, female    DOB: 08-28-1960, 51 y.o.   MRN: [id number]  HPI  Patient presents to clinic for chest discomfort, left arm numbness/tingling, and dizziness.  Chest discomfort has been going on intermittently for about 5 days.  CP described as a pressure, located on both sides of sternum.  Usually exacerbated by walking or exertion, but recently has been occuring while sitting down.  Patient complains of nausea and dizziness, no vomiting.  CP is localized, does not radiate to jaw but is associated with "tingling" of left arm.  Patient's BP is 158/113.  She did not take BB today because she needs a new refill.  Patient did take Lisinopril-HCTZ today.  Denies any changes in vision.  Does complain of HA, dizziness, nausea.  Of note, patient had a cardiac cath done in 2009 which was non-obstructive and an EKG done today which showed NSR without ST elevation.  Patient's mother had an MI at age 61. Other risk factor: HTN.  Patient has a cardiology appointment in 3 weeks.   Review of Systems  Per HPI    Objective:   Physical Exam  Constitutional: No distress.       Clammy, but not diaphoretic, appears tired  Neck: Normal range of motion. Neck supple. No JVD present.  Cardiovascular: Normal rate, regular rhythm and normal heart sounds.   Pulmonary/Chest: Effort normal and breath sounds normal. No respiratory distress. She has no wheezes. She has no rales.  Neurological: She is alert. No cranial nerve deficit.   EKG: NSR, no ST abnormalities       Assessment & Plan:

## 2011-10-08 NOTE — Patient Instructions (Addendum)
Please take your Metoprolol today and again tonight to decrease BP. You may also take Nitroglycerin SL as needed for chest pain (CP). If CP is not relieved by Nitroglycerin, please report to ED. If CP worsens or is associated with difficulty breathing, vomiting, or excessive sweating, please report to ED. Schedule follow up appointment with me next week.  Chest Pain, Nonspecific It is often hard to give a specific diagnosis for the cause of chest pain. There is always a chance that your pain could be related to something serious, like a heart attack or a blood clot in the lungs. You need to follow up with your caregiver for further evaluation. More lab tests or other studies such as X-rays, electrocardiography, stress testing, or cardiac imaging may be needed to find the cause of your pain. Most of the time, nonspecific chest pain improves within 2 to 3 days with rest and mild pain medicine. For the next few days, avoid physical exertion or activities that bring on pain. Do not smoke. Avoid drinking alcohol. Call your caregiver for routine follow-up as advised.   SEEK IMMEDIATE MEDICAL CARE IF:  You develop increased chest pain or pain that radiates to the arm, neck, jaw, back, or abdomen.   You develop shortness of breath, increased coughing, or you start coughing up blood.   You have severe back or abdominal pain, nausea, or vomiting.   You develop severe weakness, fainting, fever, or chills.  Document Released: 03/11/2005 Document Revised: 02/28/2011 Document Reviewed: 08/29/2006 [facility] Patient Information 2012 [place], [place].

## 2011-10-08 NOTE — Assessment & Plan Note (Signed)
>>  ASSESSMENT AND PLAN FOR HYPERTENSION, BENIGN ESSENTIAL WRITTEN ON 10/08/2011  1:23 PM BY DE LA CRUZ, IVY, DO  BP uncontrolled today 156/113.  Patient took Lisinopril -HCTZ, but has been out of Metoprolol  for several days and needs refill today.  It is likely that chest discomfort, dizziness, and LT arm numbness is due to elevated BP.   Gave Rx for Metoprolol  and advised patient to take twice today.  Return to clinic in one week to reassess symptoms and recheck BP.  EKG NSR, no ST abnormalities.

## 2011-10-08 NOTE — Assessment & Plan Note (Addendum)
Patient with ongoing chest pain for 4-5 days, associated with LT arm tingling.  May be secondary to unstable angina.  EKG reviewed with Dr. [place].  No ST abnormalities, NSR.  Furthermore, cardiac cath from 4 years ago was normal, non-obstructive.  Risk factors for CAD: HTN and family hx of mother with MI at age 53.  Because patient is not actively having CP and EKG and previous cath were normal, I plan to send patient home with Nitroglycerin as needed for CP and refill Metoprolol for elevated BP (156/113).  She has an appointment with cardiologist for ongoing, intermittent CP on 10/28/11.  Patient to follow up with PCP next week.  Red flags reviewed/warning signs to report to ED.  Family member is also aware of patient's condition and symptoms that require return to clinic or ED.

## 2011-10-14 ENCOUNTER — Other Ambulatory Visit (HOSPITAL_COMMUNITY)
Admission: RE | Admit: 2011-10-14 | Discharge: 2011-10-14 | Disposition: A | Discharge status: Home / Self Care | Payer: Self-pay | Source: Ambulatory Visit | Attending: Obstetrics & Gynecology | Admitting: Obstetrics & Gynecology

## 2011-10-14 ENCOUNTER — Ambulatory Visit (INDEPENDENT_AMBULATORY_CARE_PROVIDER_SITE_OTHER): Payer: Self-pay | Admitting: Obstetrics & Gynecology

## 2011-10-14 ENCOUNTER — Encounter: Payer: Self-pay | Admitting: Obstetrics & Gynecology

## 2011-10-14 ENCOUNTER — Other Ambulatory Visit: Payer: Self-pay | Admitting: Obstetrics & Gynecology

## 2011-10-14 VITALS — BP 144/99 | HR 81 | Temp 99.4°F | Ht 68.5 in | Wt 225.3 lb

## 2011-10-14 DIAGNOSIS — N920 Excessive and frequent menstruation with regular cycle: Secondary | ICD-10-CM

## 2011-10-14 DIAGNOSIS — Z01812 Encounter for preprocedural laboratory examination: Secondary | ICD-10-CM

## 2011-10-14 DIAGNOSIS — D219 Benign neoplasm of connective and other soft tissue, unspecified: Secondary | ICD-10-CM

## 2011-10-14 DIAGNOSIS — D259 Leiomyoma of uterus, unspecified: Secondary | ICD-10-CM

## 2011-10-14 LAB — POCT PREGNANCY, URINE: Preg Test, Ur: NEGATIVE

## 2011-10-14 NOTE — Patient Instructions (Signed)
Abnormal Uterine Bleeding Abnormal uterine bleeding can have many causes. Some cases are simply treated, while others are more serious. There are several kinds of bleeding that is considered abnormal, including:  Bleeding between periods.   Bleeding after sexual intercourse.   Spotting anytime in the menstrual cycle.   Bleeding heavier or more than normal.   Bleeding after menopause.  CAUSES  There are many causes of abnormal uterine bleeding. It can be present in teenagers, pregnant women, women during their reproductive years, and women who have reached menopause. Your caregiver will look for the more common causes depending on your age, signs, symptoms and your particular circumstance. Most cases are not serious and can be treated. Even the more serious causes, like cancer of the female organs, can be treated adequately if found in the early stages. That is why all types of bleeding should be evaluated and treated as soon as possible. DIAGNOSIS  Diagnosing the cause may take several kinds of tests. Your caregiver may:  Take a complete history of the type of bleeding.   Perform a complete physical exam and Pap smear.   Take an ultrasound on the abdomen showing a picture of the female organs and the pelvis.   Inject dye into the uterus and Fallopian tubes and X-ray them (hysterosalpingogram).   Place fluid in the uterus and do an ultrasound (sonohysterogrqphy).   Take a CT scan to examine the female organs and pelvis.   Take an MRI to examine the female organs and pelvis. There is no X-ray involved with this procedure.   Look inside the uterus with a telescope that has a light at the end (hysteroscopy).   Scrap the inside of the uterus to get tissue to examine (Dilatation and Curettage, D&C).   Look into the pelvis with a telescope that has a light at the end (laparoscopy). This is done through a very small cut (incision) in the abdomen.  TREATMENT  Treatment will depend on the  cause of the abnormal bleeding. It can include:  Doing nothing to allow the problem to take care of itself over time.   Hormone treatment.   Birth control pills.   Treating the medical condition causing the problem.   Laparoscopy.   Major or minor surgery   Destroying the lining of the uterus with electrical currant, laser, freezing or heat (uterine ablation).  HOME CARE INSTRUCTIONS   Follow your caregiver's recommendation on how to treat your problem.   See your caregiver if you missed a menstrual period and think you may be pregnant.   If you are bleeding heavily, count the number of pads/tampons you use and how often you have to change them. Tell this to your caregiver.   Avoid sexual intercourse until the problem is controlled.  SEEK MEDICAL CARE IF:   You have any kind of abnormal bleeding mentioned above.   You feel dizzy at times.   You are 51 years old and have not had a menstrual period yet.  SEEK IMMEDIATE MEDICAL CARE IF:   You pass out.   You are changing pads/tampons every 15 to 30 minutes.   You have belly (abdominal) pain.   You have a temperature of 100 F (37.8 C) or higher.   You become sweaty or weak.   You are passing large blood clots from the vagina.   You start to feel sick to your stomach (nauseous) and throw up (vomit).  Document Released: 03/11/2005 Document Revised: 02/28/2011 Document Reviewed: 08/04/2008 [employer]   Patient Information 2012 [employer]. 

## 2011-10-14 NOTE — Progress Notes (Signed)
Endometrial Biopsy Procedure Note  51 y.o. G1P1001 with long history of menorrhagia, fibroids.  Years of DUB treated with Depo Provera since 2009.  Since 2009, bleeding improved but still repots menorrhagia every 35-40 days, using a pack (~30) of pads.  No other symptoms.  Indications: abnormal uterine bleeding  Procedure Details   Urine pregnancy test was done today and result was negative.  The risks (including infection, bleeding, pain, and uterine perforation) and benefits of the procedure were explained to the patient and Written informed consent was obtained.  The patient was placed in the dorsal lithotomy position.  Bimanual exam showed the uterus to be in the neutral position.  A Graves' speculum inserted in the vagina, and the cervix prepped with povidone iodine.  A sharp tenaculum was applied to the anterior lip of the cervix for stabilization.  A Pipelle endometrial aspirator was used to sample the endometrium after sounding to 9 cm.  Sample was sent for pathologic examination.  Condition: Stable  Complications: None  Plan: The patient was advised to call for any fever or for prolonged or severe pain or bleeding. She was advised to use NSAID as needed for mild to moderate pain. She was advised to avoid vaginal intercourse for 48 hours or until the bleeding has completely stopped. Pelvic ultrasound also ordered. Will follow up results and manage accordingly.

## 2011-10-15 ENCOUNTER — Ambulatory Visit (HOSPITAL_COMMUNITY): Payer: Self-pay

## 2011-10-15 ENCOUNTER — Ambulatory Visit (HOSPITAL_COMMUNITY)
Admission: RE | Admit: 2011-10-15 | Discharge: 2011-10-15 | Disposition: A | Discharge status: Home / Self Care | Payer: Self-pay | Source: Ambulatory Visit | Attending: Obstetrics & Gynecology | Admitting: Obstetrics & Gynecology

## 2011-10-15 DIAGNOSIS — N920 Excessive and frequent menstruation with regular cycle: Secondary | ICD-10-CM

## 2011-10-15 DIAGNOSIS — N949 Unspecified condition associated with female genital organs and menstrual cycle: Secondary | ICD-10-CM | POA: Insufficient documentation

## 2011-10-15 DIAGNOSIS — N938 Other specified abnormal uterine and vaginal bleeding: Secondary | ICD-10-CM | POA: Insufficient documentation

## 2011-10-15 DIAGNOSIS — D250 Submucous leiomyoma of uterus: Secondary | ICD-10-CM | POA: Insufficient documentation

## 2011-10-15 DIAGNOSIS — D219 Benign neoplasm of connective and other soft tissue, unspecified: Secondary | ICD-10-CM

## 2011-10-15 DIAGNOSIS — D259 Leiomyoma of uterus, unspecified: Secondary | ICD-10-CM | POA: Insufficient documentation

## 2011-10-16 ENCOUNTER — Ambulatory Visit (INDEPENDENT_AMBULATORY_CARE_PROVIDER_SITE_OTHER): Payer: Self-pay | Admitting: Family Medicine

## 2011-10-16 ENCOUNTER — Telehealth: Payer: Self-pay

## 2011-10-16 ENCOUNTER — Encounter: Payer: Self-pay | Admitting: Family Medicine

## 2011-10-16 VITALS — BP 156/99 | HR 66 | Temp 97.8°F | Ht 68.0 in | Wt 222.8 lb

## 2011-10-16 DIAGNOSIS — R079 Chest pain, unspecified: Secondary | ICD-10-CM

## 2011-10-16 DIAGNOSIS — I10 Essential (primary) hypertension: Secondary | ICD-10-CM

## 2011-10-16 MED ORDER — CLONAZEPAM 1 MG PO TABS: 1.0000 mg | ORAL_TABLET | Freq: Every day | ORAL | Status: DC | PRN

## 2011-10-16 NOTE — Assessment & Plan Note (Signed)
CP, nausea, dizziness have improved since BP has improved.  No CP at this time. She has an appointment with [facility] in 2 weeks.

## 2011-10-16 NOTE — Progress Notes (Signed)
  Subjective:     Patient here for follow-up of elevated blood pressure.  She is exercising and is adherent to a low-salt diet.  Blood pressure is fairly well controlled on Metoprolol 25 BID and Lisinopril-HCTZ. Cardiac symptoms: fatigue, near-syncope and intermittent chest pain with exertion.  Patient was last seen in our clinic for chest pain, EKG was normal, and CP has resolved since then.  She still complains of nausea and dizziness that comes and goes.  Patient denies: claudication, dyspnea, lower extremity edema and syncope. Cardiovascular risk factors: family history of premature cardiovascular disease and hypertension. Use of agents associated with hypertension: NSAIDS and oral contraceptives.   The following portions of the patient's history were reviewed and updated as appropriate: allergies, current medications, past medical history and problem list.  Review of Systems Pertinent items are noted in HPI.     Objective:     General appearance: alert, cooperative and no distress Eyes: PERRLA. EOMI. Lungs: clear to auscultation bilaterally Heart: regular rate and rhythm, S1, S2 normal, no murmur, click, rub or gallop Extremities: extremities normal, atraumatic, no cyanosis or edema    Assessment:    Hypertension, fairly controlled.   Plan:

## 2011-10-16 NOTE — Assessment & Plan Note (Signed)
>>  ASSESSMENT AND PLAN FOR HYPERTENSION, BENIGN ESSENTIAL WRITTEN ON 10/16/2011 10:38 AM BY DE LA CRUZ, IVY, DO  BP better today 159/99, but patient just took BP medications several minutes ago. Encouraged DASH diet and weight loss. Follow up with me in 2 months.

## 2011-10-16 NOTE — Assessment & Plan Note (Signed)
BP better today 159/99, but patient just took BP medications several minutes ago. Encouraged DASH diet and weight loss. Follow up with me in 2 months.

## 2011-10-16 NOTE — Telephone Encounter (Signed)
Called pt and informed pt that her biopsy results are normal and that she has appt scheduled on 10/31/11 @ 4pm to discuss [place] and further management.  Pt stated understanding and had no further questions.

## 2011-10-16 NOTE — Telephone Encounter (Signed)
Message copied by Faythe Casa on Wed Oct 16, 2011  4:31 PM ------      Message from: Jaynie Collins A      Created: Wed Oct 16, 2011  2:46 PM       Benign biopsy, please inform patient.

## 2011-10-22 ENCOUNTER — Ambulatory Visit (INDEPENDENT_AMBULATORY_CARE_PROVIDER_SITE_OTHER): Payer: Self-pay | Admitting: Sports Medicine

## 2011-10-22 VITALS — BP 154/110

## 2011-10-22 DIAGNOSIS — M1710 Unilateral primary osteoarthritis, unspecified knee: Secondary | ICD-10-CM

## 2011-10-22 DIAGNOSIS — M170 Bilateral primary osteoarthritis of knee: Secondary | ICD-10-CM

## 2011-10-22 MED ORDER — AMITRIPTYLINE HCL 25 MG PO TABS: ORAL_TABLET | ORAL | Status: DC

## 2011-10-22 NOTE — Assessment & Plan Note (Signed)
Patient with known arthritis documented on x-rays in 2012  Compared to last visit she is having more crepitation on both of her patella She also has less knee flexion and more pain with flexion particularly on the right  Today we fitted for a DonJoy brace and she was much more comfortable than on the right knee She will use this for one week but if it's helpful we may give her another one for the left  Her sleep pattern is interrupted with pain so I gave her amitriptyline 25 to start at night and we will increase this to 50 if needed  She will work with her family physician about adjusting her pain medications as needed She will return to family practice for followup and we are happy to see her again if needed

## 2011-10-22 NOTE — Progress Notes (Signed)
  Subjective:    Patient ID: Shannon Stuart, female    DOB: 08-12-1960, 51 y.o.   MRN: [id number]  HPI  Pt presents to clinic for f/u of rt knee pain which has worsened since last visit. Taking 4 vicodin per day, stopped tramadol and meloxicam because she did not feel they were helpful. States she often has trouble sleeping due to knee pain.   Had been using knee sleeve, but this has worn out  Sometimes feels that the right knee is a bit unstable She gets some swelling by the end of the day and ice does help  She takes ibuprofen 800 3 times a day for her low back and that does help control pain  Review of Systems     Objective:   Physical Exam Moderately obese female in no acute distress   Crepitation bilat knees with flexion and extension Full extension bilat Pain starts at 120 deg of flexion on lt  Pain starts at 90 deg of flexion on rt Small effusion on rt knee Ligaments stable on rt Movement of patella is painful Light pressure on either patella causes crepitation within the sulcus     Assessment & Plan:

## 2011-10-22 NOTE — Patient Instructions (Addendum)
Ice knee at the end of each day  Use knee brace for activity  Use icy hot as needed  Start amitriptyline 1-2 tablets at bedtime  Please follow up with Dr. Tye Savoy in 4-6 weeks  Thank you for seeing Shannon Stuart today!

## 2011-10-28 ENCOUNTER — Encounter: Payer: Self-pay | Admitting: Internal Medicine

## 2011-10-28 ENCOUNTER — Ambulatory Visit (INDEPENDENT_AMBULATORY_CARE_PROVIDER_SITE_OTHER): Payer: Self-pay | Admitting: Internal Medicine

## 2011-10-28 VITALS — BP 155/102 | HR 68 | Ht 68.0 in | Wt 224.0 lb

## 2011-10-28 DIAGNOSIS — R0602 Shortness of breath: Secondary | ICD-10-CM

## 2011-10-28 DIAGNOSIS — R079 Chest pain, unspecified: Secondary | ICD-10-CM

## 2011-10-28 MED ORDER — AMLODIPINE BESYLATE 5 MG PO TABS: 5.0000 mg | ORAL_TABLET | Freq: Every day | ORAL | Status: DC

## 2011-10-28 NOTE — Progress Notes (Signed)
HPI Patient is a 51 year old with a history of HTN, minimal CAD  (cath in 2009 showed irregularities of coronary arteries), obesity.  She was previously seen by Ronnald Nian in 2010 Hx of HTN  BP usually runs a little high.  Has not taken meds today. Get up around 6 AM  Takes meds about 10 AM though today missed them. Breathing OK Does get CP.  L arm and should have pain numb  Chest get tight  Occasional pain.  Nauseated   Arm asleep.  Mother died of MI at 7  MGM died of CHF at 40 Says cholesterol is not high Wakes up with L arm numb, No Known Allergies  Current Outpatient Prescriptions  Medication Sig Dispense Refill  . clonazePAM (KLONOPIN) 1 MG tablet Take 1 tablet (1 mg total) by mouth daily as needed for anxiety.  90 tablet  3  . cyclobenzaprine (FLEXERIL) 10 MG tablet Take 1 tablet (10 mg total) by mouth 3 (three) times daily as needed for muscle spasms. For back pain  90 tablet  0  . HYDROcodone-acetaminophen (NORCO) 5-325 MG per tablet Take 1 tablet by mouth every 8 (eight) hours as needed. Pain.  30 tablet  0  . ibuprofen (ADVIL,MOTRIN) 800 MG tablet Take 800 mg by mouth every 8 (eight) hours as needed.      Marland Kitchen lisinopril-hydrochlorothiazide (PRINZIDE,ZESTORETIC) 20-25 MG per tablet Take 1 tablet by mouth daily.  90 tablet  11  . metoprolol tartrate (LOPRESSOR) 25 MG tablet Take 1 tablet (25 mg total) by mouth 2 (two) times daily.  60 tablet  6  . nitroGLYCERIN (NITROSTAT) 0.4 MG SL tablet Place 1 tablet (0.4 mg total) under the tongue every 5 (five) minutes as needed for chest pain.  15 tablet  0  . zolpidem (AMBIEN) 10 MG tablet Take 1 tablet (10 mg total) by mouth at bedtime as needed for sleep.  30 tablet  3  . amitriptyline (ELAVIL) 25 MG tablet Take 1-2 tablets by mouth at bedtime  60 tablet  1  . ibuprofen (ADVIL,MOTRIN) 800 MG tablet Take 1 tablet (800 mg total) by mouth every 8 (eight) hours as needed.  90 tablet  3  . metoprolol tartrate (LOPRESSOR) 25 MG tablet Take 1 tablet  (25 mg total) by mouth 2 (two) times daily.  60 tablet  5    Past Medical History  Diagnosis Date  . Allergy   . Anxiety   . Depression   . Hyperlipidemia   . Hypertension   . Endometriosis     No past surgical history on file.  No family history on file.  History   Social History  . Marital Status: Single    Spouse Name: N/A    Number of Children: N/A  . Years of Education: N/A   Occupational History  . Not on file.   Social History Main Topics  . Smoking status: Never Smoker   . Smokeless tobacco: Never Used  . Alcohol Use: Yes     'SOCIALLY"  . Drug Use: Yes    Special: Marijuana     "social smoking"  . Sexually Active: Not Currently -- Female partner(s)    Birth Control/ Protection: Injection   Other Topics Concern  . Not on file   Social History Narrative  . No narrative on file    Review of Systems:  All systems reviewed.  They are negative to the above problem except as previously stated.  Vital Signs: BP 155/102  Pulse 68  Ht 5\' 8"  (1.727 m)  Wt 224 lb (101.606 kg)  BMI 34.06 kg/m2  LMP 09/20/2011  Physical Exam Patient is in NAD HEENT:  Normocephalic, atraumatic. EOMI, PERRLA.  Facial hair present.  Voice deep  Neck: JVP is normal. No thyromegaly. No bruits.  Lungs: clear to auscultation. No rales no wheezes.  Heart: Regular rate and rhythm. Normal S1, S2. No S3.   No significant murmurs. PMI not displaced.  Abdomen:  Obese.  Supple, nontender. Normal bowel sounds. No masses. No hepatomegaly.  Extremities:   Good distal pulses throughout. No lower extremity edema.  Musculoskeletal :moving all extremities.  Neuro:   alert and oriented x3.  CN II-XII grossly intact.   Assessment and Plan:  1.  HTN  Not optimally controlled  Had been on amlodipine in past.  I would add back.   5mg   She will follow  Will also schedule echo.  Evaluate for end organ damage with HTN  2.  Chest pressure.  Patient has risks for CAD  I am not sure symptoms  reflect angina but I would set up a lexiscan myoview (with HTN)  3.  HCM  WIll set up for lipid panel fasting, Hgb A1C and BMET.    Patient appears to have PCOS  Question if would benefit from other meds.or if primary to follow.

## 2011-10-28 NOTE — Patient Instructions (Signed)
Your physician has requested that you have an echocardiogram. Echocardiography is a painless test that uses sound waves to create images of your heart. It provides your doctor with information about the size and shape of your heart and how well your heart's chambers and valves are working. This procedure takes approximately one hour. There are no restrictions for this procedure.  Your physician has requested that you have a lexiscan myoview. For further information please visit [email or web address] Please follow instruction sheet, as given.  Fasting Lab work day of stress test.

## 2011-10-30 ENCOUNTER — Ambulatory Visit (HOSPITAL_COMMUNITY): Payer: Self-pay | Attending: Cardiovascular Disease

## 2011-10-30 DIAGNOSIS — E785 Hyperlipidemia, unspecified: Secondary | ICD-10-CM | POA: Insufficient documentation

## 2011-10-30 DIAGNOSIS — R0602 Shortness of breath: Secondary | ICD-10-CM

## 2011-10-30 DIAGNOSIS — I517 Cardiomegaly: Secondary | ICD-10-CM | POA: Insufficient documentation

## 2011-10-30 DIAGNOSIS — R072 Precordial pain: Secondary | ICD-10-CM | POA: Insufficient documentation

## 2011-10-30 DIAGNOSIS — R0609 Other forms of dyspnea: Secondary | ICD-10-CM | POA: Insufficient documentation

## 2011-10-30 DIAGNOSIS — I10 Essential (primary) hypertension: Secondary | ICD-10-CM | POA: Insufficient documentation

## 2011-10-30 DIAGNOSIS — R079 Chest pain, unspecified: Secondary | ICD-10-CM

## 2011-10-30 DIAGNOSIS — R0989 Other specified symptoms and signs involving the circulatory and respiratory systems: Secondary | ICD-10-CM | POA: Insufficient documentation

## 2011-10-30 NOTE — Progress Notes (Signed)
Echocardiogram performed.  

## 2011-10-31 ENCOUNTER — Ambulatory Visit (INDEPENDENT_AMBULATORY_CARE_PROVIDER_SITE_OTHER): Payer: No Typology Code available for payment source | Admitting: Physician Assistant

## 2011-10-31 VITALS — BP 131/97 | HR 67 | Temp 99.5°F | Ht 68.5 in | Wt 227.6 lb

## 2011-10-31 DIAGNOSIS — N938 Other specified abnormal uterine and vaginal bleeding: Secondary | ICD-10-CM

## 2011-10-31 DIAGNOSIS — N925 Other specified irregular menstruation: Secondary | ICD-10-CM

## 2011-10-31 DIAGNOSIS — N949 Unspecified condition associated with female genital organs and menstrual cycle: Secondary | ICD-10-CM

## 2011-10-31 NOTE — Progress Notes (Signed)
Chief Complaint:  Follow-up   Shannon Stuart is  51 y.o. G1P1001.  No LMP recorded. Patient has had an injection..   She presents complaining of Follow-up  Returns today for results of pelvic US and follow up of DUB. Reports no bleeding since 2nd injection of Depo Provera  Past Medical History: Past Medical History  Diagnosis Date  . Allergy   . Anxiety   . Depression   . Hyperlipidemia   . Hypertension   . Endometriosis     Past Surgical History: No past surgical history on file.  Family History: No family history on file.  Social History: History  Substance Use Topics  . Smoking status: Never Smoker   . Smokeless tobacco: Never Used  . Alcohol Use: Yes     'SOCIALLY"    Allergies: No Known Allergies   (Not in a hospital admission)  Review of Systems - Negative except what is mentioned in HPI  Physical Exam   Blood pressure 131/97, pulse 67, temperature 99.5 F (37.5 C), temperature source Oral, height 5' 8.5" (1.74 m), weight 227 lb 9.6 oz (103.239 kg). Deferred  Labs: No results found for this or any previous visit (from the past 24 hour(s)). Imaging Studies:  US Transvaginal Non-ob  10/15/2011  *RADIOLOGY REPORT*  Clinical Data: Abnormal bleeding, fibroids  TRANSABDOMINAL AND TRANSVAGINAL ULTRASOUND OF PELVIS Technique:  Both transabdominal and transvaginal ultrasound examinations of the pelvis were performed. Transabdominal technique was performed for global imaging of the pelvis including uterus, ovaries, adnexal regions, and pelvic cul-de-sac.  It was necessary to proceed with endovaginal exam following the transabdominal exam to visualize the endometrium.  Comparison:  November 22, 2010  Findings: The uterus remains within normal limits in size, measuring 9.5 x 5.7 x 5.9 cm (previously 10.4 x 5.7 x 5.4 cm.) Multiple fibroids are again noted.  The largest is posterior lateral on the right, measuring 4.8 cm, unchanged.  A 3.3 cm submucosal fibroid is also again  noted and not significantly changed.  The endometrial stripe is difficult to see because of the multiple adjacent fibroids, however, does not appear thickened, measuring 9 mm.  The right ovary has a normal size and appearance, measuring 2.6 x 2.3 x 1.8 cm.  The left ovary is not identified.  IMPRESSION: Fibroid uterus.  The overall size and appearance of the multiple fibroids is stable.  Endometrial stripe is within normal limits.  Original Report Authenticated By: Brandon Melnick, M.D.    Assessment: Patient Active Problem List  Diagnosis  . FIBROIDS, UTERUS  . HYPERLIPIDEMIA  . OBESITY NOS  . ANXIETY STATE NOS  . HYPERTENSION, BENIGN ESSENTIAL  . RHINITIS, ALLERGIC NOS  . GERD  . CHOLELITHIASIS  . DYSFUNCTIONAL UTERINE BLEEDING  . BACK PAIN, LUMBAR  . Cough  . Sinusitis  . Depression  . Lower extremity edema  . Right knee pain  . Arthritis of both knees  . Dermoid cyst  . Chest pain    Plan: Patient wants to continue Depo Provera for now She will return in 3 months Bleeding precautions reviewed  Verl Whitmore E. 10/31/2011,4:17 PM

## 2011-10-31 NOTE — Patient Instructions (Signed)
Abnormal Uterine Bleeding Abnormal uterine bleeding can have many causes. Some cases are simply treated, while others are more serious. There are several kinds of bleeding that is considered abnormal, including:  Bleeding between periods.   Bleeding after sexual intercourse.   Spotting anytime in the menstrual cycle.   Bleeding heavier or more than normal.   Bleeding after menopause.  CAUSES  There are many causes of abnormal uterine bleeding. It can be present in teenagers, pregnant women, women during their reproductive years, and women who have reached menopause. Your caregiver will look for the more common causes depending on your age, signs, symptoms and your particular circumstance. Most cases are not serious and can be treated. Even the more serious causes, like cancer of the female organs, can be treated adequately if found in the early stages. That is why all types of bleeding should be evaluated and treated as soon as possible. DIAGNOSIS  Diagnosing the cause may take several kinds of tests. Your caregiver may:  Take a complete history of the type of bleeding.   Perform a complete physical exam and Pap smear.   Take an ultrasound on the abdomen showing a picture of the female organs and the pelvis.   Inject dye into the uterus and Fallopian tubes and X-ray them (hysterosalpingogram).   Place fluid in the uterus and do an ultrasound (sonohysterogrqphy).   Take a CT scan to examine the female organs and pelvis.   Take an MRI to examine the female organs and pelvis. There is no X-ray involved with this procedure.   Look inside the uterus with a telescope that has a light at the end (hysteroscopy).   Scrap the inside of the uterus to get tissue to examine (Dilatation and Curettage, D&C).   Look into the pelvis with a telescope that has a light at the end (laparoscopy). This is done through a very small cut (incision) in the abdomen.  TREATMENT  Treatment will depend on the  cause of the abnormal bleeding. It can include:  Doing nothing to allow the problem to take care of itself over time.   Hormone treatment.   Birth control pills.   Treating the medical condition causing the problem.   Laparoscopy.   Major or minor surgery   Destroying the lining of the uterus with electrical currant, laser, freezing or heat (uterine ablation).  HOME CARE INSTRUCTIONS   Follow your caregiver's recommendation on how to treat your problem.   See your caregiver if you missed a menstrual period and think you may be pregnant.   If you are bleeding heavily, count the number of pads/tampons you use and how often you have to change them. Tell this to your caregiver.   Avoid sexual intercourse until the problem is controlled.  SEEK MEDICAL CARE IF:   You have any kind of abnormal bleeding mentioned above.   You feel dizzy at times.   You are 51 years old and have not had a menstrual period yet.  SEEK IMMEDIATE MEDICAL CARE IF:   You pass out.   You are changing pads/tampons every 15 to 30 minutes.   You have belly (abdominal) pain.   You have a temperature of 100 F (37.8 C) or higher.   You become sweaty or weak.   You are passing large blood clots from the vagina.   You start to feel sick to your stomach (nauseous) and throw up (vomit).  Document Released: 03/11/2005 Document Revised: 02/28/2011 Document Reviewed: 08/04/2008 [employer]   Patient Information 2012 [employer]. 

## 2011-11-04 ENCOUNTER — Ambulatory Visit: Payer: Self-pay | Admitting: Family Medicine

## 2011-11-05 ENCOUNTER — Ambulatory Visit (HOSPITAL_COMMUNITY): Payer: Self-pay | Attending: Internal Medicine | Admitting: Radiology

## 2011-11-05 ENCOUNTER — Other Ambulatory Visit (INDEPENDENT_AMBULATORY_CARE_PROVIDER_SITE_OTHER): Payer: Self-pay

## 2011-11-05 VITALS — BP 134/106 | HR 60 | Ht 68.5 in | Wt 227.0 lb

## 2011-11-05 DIAGNOSIS — R5381 Other malaise: Secondary | ICD-10-CM | POA: Insufficient documentation

## 2011-11-05 DIAGNOSIS — R0602 Shortness of breath: Secondary | ICD-10-CM

## 2011-11-05 DIAGNOSIS — R079 Chest pain, unspecified: Secondary | ICD-10-CM

## 2011-11-05 DIAGNOSIS — E785 Hyperlipidemia, unspecified: Secondary | ICD-10-CM | POA: Insufficient documentation

## 2011-11-05 DIAGNOSIS — R0789 Other chest pain: Secondary | ICD-10-CM | POA: Insufficient documentation

## 2011-11-05 DIAGNOSIS — I2510 Atherosclerotic heart disease of native coronary artery without angina pectoris: Secondary | ICD-10-CM

## 2011-11-05 DIAGNOSIS — I079 Rheumatic tricuspid valve disease, unspecified: Secondary | ICD-10-CM | POA: Insufficient documentation

## 2011-11-05 DIAGNOSIS — Z8249 Family history of ischemic heart disease and other diseases of the circulatory system: Secondary | ICD-10-CM | POA: Insufficient documentation

## 2011-11-05 DIAGNOSIS — Z87891 Personal history of nicotine dependence: Secondary | ICD-10-CM | POA: Insufficient documentation

## 2011-11-05 DIAGNOSIS — R000 Tachycardia, unspecified: Secondary | ICD-10-CM | POA: Insufficient documentation

## 2011-11-05 DIAGNOSIS — R55 Syncope and collapse: Secondary | ICD-10-CM | POA: Insufficient documentation

## 2011-11-05 DIAGNOSIS — I10 Essential (primary) hypertension: Secondary | ICD-10-CM

## 2011-11-05 DIAGNOSIS — R002 Palpitations: Secondary | ICD-10-CM | POA: Insufficient documentation

## 2011-11-05 DIAGNOSIS — E669 Obesity, unspecified: Secondary | ICD-10-CM | POA: Insufficient documentation

## 2011-11-05 DIAGNOSIS — R42 Dizziness and giddiness: Secondary | ICD-10-CM | POA: Insufficient documentation

## 2011-11-05 LAB — BASIC METABOLIC PANEL
BUN: 17 mg/dL (ref 6–23)
CO2: 23 mEq/L (ref 19–32)
Calcium: 8.6 mg/dL (ref 8.4–10.5)
Chloride: 103 mEq/L (ref 96–112)
Creatinine, Ser: 1.2 mg/dL (ref 0.4–1.2)
GFR: 62.15 mL/min (ref >60.00)
Glucose, Bld: 106 mg/dL — ABNORMAL HIGH (ref 70–99)
Potassium: 3.7 mEq/L (ref 3.5–5.1)
Sodium: 135 mEq/L (ref 135–145)

## 2011-11-05 LAB — LIPID PANEL
Cholesterol: 181 mg/dL (ref 0–200)
HDL: 35.6 mg/dL — ABNORMAL LOW (ref >39.00)
LDL Cholesterol: 112 mg/dL — ABNORMAL HIGH (ref 0–99)
Total CHOL/HDL Ratio: 5
Triglycerides: 165 mg/dL — ABNORMAL HIGH (ref 0.0–149.0)
VLDL: 33 mg/dL (ref 0.0–40.0)

## 2011-11-05 LAB — HEMOGLOBIN A1C: Hgb A1c MFr Bld: 6.5 % (ref 4.6–6.5)

## 2011-11-05 LAB — TSH: TSH: 1.01 u[IU]/mL (ref 0.35–5.50)

## 2011-11-05 MED ORDER — TECHNETIUM TC 99M TETROFOSMIN IV KIT
33.0000 | PACK | Freq: Once | INTRAVENOUS | Status: AC | PRN
  Administered 2011-11-05: 33 via INTRAVENOUS

## 2011-11-05 MED ORDER — TECHNETIUM TC 99M TETROFOSMIN IV KIT
11.0000 | PACK | Freq: Once | INTRAVENOUS | Status: AC | PRN
  Administered 2011-11-05: 11 via INTRAVENOUS

## 2011-11-05 MED ORDER — CLONIDINE HCL 0.1 MG PO TABS: 0.1000 mg | ORAL_TABLET | Freq: Two times a day (BID) | ORAL | Status: DC

## 2011-11-05 MED ORDER — CLONIDINE HCL 0.1 MG PO TABS
0.1000 mg | ORAL_TABLET | Freq: Once | ORAL | Status: AC
  Administered 2011-11-05: 0.1 mg via ORAL

## 2011-11-05 MED ORDER — AMINOPHYLLINE 25 MG/ML IV SOLN
75.0000 mg | INTRAVENOUS | Status: AC
  Administered 2011-11-05 (×2): 75 mg via INTRAVENOUS

## 2011-11-05 MED ORDER — REGADENOSON 0.4 MG/5ML IV SOLN
0.4000 mg | Freq: Once | INTRAVENOUS | Status: AC
  Administered 2011-11-05: 0.4 mg via INTRAVENOUS

## 2011-11-05 NOTE — Progress Notes (Signed)
[facility] SITE 3 NUCLEAR MED [address] [place] [address] [phone]  Cardiology Nuclear Med Study  Shannon Stuart is a 51 y.o. female     MRN : [id number]     DOB: June 19, 1960  Procedure Date: 11/05/2011  Nuclear Med Background Indication for Stress Test:  Evaluation for Ischemia. History:  '09 Cath:Minimal CAD; 10/25/11 Echo:EF=55-65%, mild LVH, TVR. Cardiac Risk Factors: Family History - CAD, History of Smoking, Hypertension, Lipids and Obesity.  Symptoms:  Chest Pain/Tightness>(L) Arm with and without Exertion (last episode of chest discomfort is now, 3/10), Dizziness, Light-Headedness, Near Syncope/Syncope, Palpitations, Rapid HR and Fatigue   Nuclear Pre-Procedure Caffeine/Decaff Intake:  None NPO After: 9:30pm   Lungs:  Clear. O2 Sat: 98% on room air. IV 0.9% NS with Angio Cath:  20g  IV Site: R Antecubital  IV Started by:  Stanton Kidney, EMT-P  Chest Size (in):  42 Cup Size: D  Height: 5' 8.5" (1.74 m)  Weight:  227 lb (102.967 kg)  BMI:  Body mass index is 34.01 kg/(m^2). Tech Comments:  All medications taken today, per patient.  Pt. has not started back on Norvasc, because of money issues.    Nuclear Med Study 1 or 2 day study: 1 day  Stress Test Type:  Eugenie Birks  Reading MD: Charlton Haws, MD  Order Authorizing Provider:  Dietrich Pates, MD  Resting Radionuclide: Technetium 46m Tetrofosmin  Resting Radionuclide Dose: 11.0 mCi   Stress Radionuclide:  Technetium 93m Tetrofosmin  Stress Radionuclide Dose: 33.0 mCi           Stress Protocol Rest HR: 60 Stress HR: 114  Rest BP: 134/106 Stress BP: 166/104  Exercise Time (min): n/a METS: n/a   Predicted Max HR: 167 bpm % Max HR: 68.26 bpm Rate Pressure Product: 21308   Dose of Adenosine (mg):  n/a Dose of Lexiscan: 0.4 mg Dose of Aminophylline:150 mg  Dose of Atropine (mg): n/a Dose of Dobutamine: n/a mcg/kg/min (at max HR)  Stress Test Technologist: Smiley Houseman, CMA-N  Nuclear Technologist:   Domenic Polite, CNMT     Rest Procedure:  Myocardial perfusion imaging was performed at rest 45 minutes following the intravenous administration of Technetium 83m Tetrofosmin.  Rest ECG: No acute changes.  Stress Procedure:  The patient received IV Lexiscan 0.4 mg over 15-seconds.  Technetium 66m Tetrofosmin injected at 30-seconds.  There were no significant EKG changes with Lexiscan.  The patient was very symptomatic with chest tightness, nausea and extreme dizziness.  Aminophylline 75 mg was given with some relief.  Quantitative spect images were obtained after a 45 minute delay.  While under the camera for stress images she c/o feeling weird, dizzy, persistent nausea, "could vomit" and seeing black spots.  She was given another 75 mg aminophylline and began to feel better.  BP 158/110 after stress images.  After 20-minutes "feel better", but still dizzy and nauseated, chest tightness, 2/10.  BP still 150/112.    Discussed with Dr. Excell Seltzer, DOD, he said to give her Clonidine 0.1mg  and to evaluate her.  BP at 1:15 pm was 130/108, she was feeling much better; still had chest tightness, 4/10, which she had prior to [facility].  Felt it was safe for her to leave, start her Norvasc and follow up with Dr. Tenny Craw.  Stress ECG: No significant change from baseline ECG  QPS Raw Data Images:  Normal; no motion artifact; normal heart/lung ratio. Stress Images:  Normal homogeneous uptake in all areas of the  myocardium. Rest Images:  Normal homogeneous uptake in all areas of the myocardium. Subtraction (SDS):  Normal Transient Ischemic Dilatation (Normal <1.22):  1.03 Lung/Heart Ratio (Normal <0.45):  0.27  Quantitative Gated Spect Images QGS EDV:  100 ml QGS ESV:  41 ml  Impression Exercise Capacity:  Lexiscan with no exercise. BP Response:  Normal blood pressure response. Clinical Symptoms:  Nausea and dizzyness aminophylline 75mg  given ECG Impression:  No significant ST segment change suggestive of  ischemia. Comparison with Prior Nuclear Study: No previous nuclear study performed  Overall Impression:  Normal stress nuclear study.  LV Ejection Fraction: 60%.  LV Wall Motion:  NL LV Function; NL Wall Motion   Charlton Haws

## 2011-11-08 ENCOUNTER — Other Ambulatory Visit: Payer: Self-pay | Admitting: *Deleted

## 2011-11-08 MED ORDER — ATORVASTATIN CALCIUM 20 MG PO TABS: ORAL_TABLET | ORAL | Status: DC

## 2011-11-28 ENCOUNTER — Encounter: Payer: Self-pay | Admitting: Family Medicine

## 2011-11-28 ENCOUNTER — Ambulatory Visit (INDEPENDENT_AMBULATORY_CARE_PROVIDER_SITE_OTHER): Payer: Self-pay | Admitting: Family Medicine

## 2011-11-28 VITALS — BP 138/82 | HR 71 | Temp 98.9°F | Ht 68.5 in | Wt 230.5 lb

## 2011-11-28 DIAGNOSIS — M25569 Pain in unspecified knee: Secondary | ICD-10-CM

## 2011-11-28 DIAGNOSIS — M1710 Unilateral primary osteoarthritis, unspecified knee: Secondary | ICD-10-CM

## 2011-11-28 DIAGNOSIS — M170 Bilateral primary osteoarthritis of knee: Secondary | ICD-10-CM

## 2011-11-28 DIAGNOSIS — M25561 Pain in right knee: Secondary | ICD-10-CM

## 2011-11-28 MED ORDER — TRAMADOL HCL 50 MG PO TABS: 100.0000 mg | ORAL_TABLET | Freq: Three times a day (TID) | ORAL | Status: AC | PRN

## 2011-11-28 MED ORDER — KETOROLAC TROMETHAMINE 60 MG/2ML IM SOLN
60.0000 mg | Freq: Once | INTRAMUSCULAR | Status: AC
  Administered 2011-11-28: 60 mg via INTRAMUSCULAR

## 2011-11-28 MED ORDER — DICLOFENAC SODIUM 1 % TD GEL: 2.0000 g | Freq: Four times a day (QID) | TRANSDERMAL | Status: DC

## 2011-11-28 NOTE — Patient Instructions (Addendum)
I will resend Tramadol to your pharmacy. Start applying Voltaren Gel to your RT knee as needed for pain. Continue Motrin 800 mg every 8 hours as needed for low back pain. I will look into referring you to an orthopedic specialist for possible surgery. Your blood pressure is normal.  Great job!  Continue to follow a low sodium diet. Follow up with me in 3 months or sooner as needed.

## 2011-11-29 ENCOUNTER — Encounter: Payer: Self-pay | Admitting: Family Medicine

## 2011-11-29 NOTE — Assessment & Plan Note (Signed)
Pain not well controlled on medications - some improvement with DonJoy brace. She used up Vicodin and has run out of Tramadol. Plan to continue Ibuprofen PRN and continue to monitor kidney function. Refill Tramadol and alterate with Ibuprofen. Also started Voltaren gel to RT knee. Ultimately, patient will need to see an orthopedic surgeon to discuss replacement. Referral ordered - patient will be placed on waiting list at [facility]. Follow up as needed for pain.

## 2011-11-29 NOTE — Progress Notes (Signed)
  Subjective:    Patient ID: Steward Drone, female    DOB: Jul 30, 1960, 51 y.o.   MRN: [id number]  HPI  Patient presents to clinic to discuss pain control.  She was last seen at [facility] in July and was told that eventually patient may need surgery.  She was given Rx for Amitriptyline, however she did not fill it because she "heard rumors that it causes people to act crazy."  She is aware that it may ease pain and help her sleep at night, but she does not want to try it.   Patient is currently taking Motrin 800 mg TID for low back and knee pain.  She has run out of Vicodin and Tramadol.  She says neither medications really helped.  "I was chewing Vicodin like candy."  Patient is willing to meet with an orthopedic surgeon, however she has the orange card and will likely need to be seen at [facility].  In the meantime, patient agrees to take Tramadol and limit Motrin use.  She does not want to take narcotics at this time.  Complains of RT knee pain, low back pain.  Denies any saddle anesthesia, urinary or bowel incontinence.  Denies associated fever, chills, nausea/vomiting.   Review of Systems  Per HPI    Objective:   Physical Exam  Constitutional: No distress.       Depressed mood  Musculoskeletal:       Crepitation RT knee with flexion and extension, Small effusion; normal gait  Neurological: She is alert.       No focal deficits          Assessment & Plan:

## 2011-12-05 ENCOUNTER — Telehealth: Payer: Self-pay | Admitting: Family Medicine

## 2011-12-05 NOTE — Telephone Encounter (Signed)
Is asking for a permanent handicap sticker

## 2011-12-11 NOTE — Telephone Encounter (Signed)
Pt informed that application is up front ready for p/u.Shannon Stuart

## 2011-12-11 NOTE — Telephone Encounter (Signed)
Handicap application has been signed my me.  Patient needs to pick it up at front desk and complete her part.  Thanks.

## 2011-12-27 ENCOUNTER — Ambulatory Visit (INDEPENDENT_AMBULATORY_CARE_PROVIDER_SITE_OTHER): Payer: Self-pay | Admitting: Family Medicine

## 2011-12-27 ENCOUNTER — Encounter: Payer: Self-pay | Admitting: Family Medicine

## 2011-12-27 VITALS — BP 138/92 | HR 74 | Temp 99.3°F | Ht 68.5 in | Wt 232.8 lb

## 2011-12-27 DIAGNOSIS — M25561 Pain in right knee: Secondary | ICD-10-CM

## 2011-12-27 DIAGNOSIS — M25569 Pain in unspecified knee: Secondary | ICD-10-CM

## 2011-12-27 DIAGNOSIS — M545 Low back pain, unspecified: Secondary | ICD-10-CM

## 2011-12-27 DIAGNOSIS — Z23 Encounter for immunization: Secondary | ICD-10-CM

## 2011-12-27 MED ORDER — DICLOFENAC SODIUM 1 % TD GEL: 2.0000 g | Freq: Four times a day (QID) | TRANSDERMAL | Status: DC

## 2011-12-27 MED ORDER — DULOXETINE HCL 30 MG PO CPEP: 30.0000 mg | ORAL_CAPSULE | Freq: Every day | ORAL | Status: DC

## 2011-12-27 MED ORDER — OXYCODONE-ACETAMINOPHEN 5-325 MG PO TABS: 1.0000 | ORAL_TABLET | Freq: Four times a day (QID) | ORAL | Status: DC | PRN

## 2011-12-27 NOTE — Patient Instructions (Addendum)
Thanks for coming in today  I have written you a percocet Rx that will help for the short term Long term you'll need further evaluation of your knee from ortho  I have written An Rx for ketoprofen gel which will be slightly less expensive than voltaren gel.

## 2011-12-27 NOTE — Progress Notes (Signed)
  Subjective:    Patient ID: Shannon Stuart, female    DOB: 12/07/1960, 51 y.o.   MRN: [id number]  HPI Pt here for acute visit of R knee pain and low back pain.   R Knee pain: States that it's been worsening since April, sometimes gives out and causes falls. Motrin helping some. ultram not helping, not willing to try elavil as she has had it in the past. Wears a knee brace which helps. No locking. No trauma recently. Did not want narcotics last month but states that she's just got to have something now. Percocet in the past seemed to last longer than norco for her. Got an Rx last year from neurosurgery but now only see's [place].  - waiting to see [facility]  Low back pain:  Low back pain exacerbated by bending and twisting. Assoc stable bowel incontinence and tingling in perineal area since back surgery. Not currently exacerbated in comparison to the knee pain. Contemplating surgery but unsure because it was such a hard recovery last time. Bowel incontinence approx 2-3 times per week- chronically. No new weakness or new symptoms.  No fevers, chills, or sweats.    Review of Systems Per HPI    Objective:   Physical Exam  Gen: NAD, alert, cooperative with exam HEENT: NCAT, EOMI, PERRL CV: RRR, good s1/s2, no murmur Resp: CTABL, no wheezes, non-labored Ext: No edema   R knee- crepitus with movement, tenderness over patellar tendon,  MSK: Low backpain with palpation of lumbar spine and paraspinal area, greatest over spine. ROM ext/flexion/twisting limited by pain.  Neuro: Alert and oriented, difficulty standing on tip-toes, decreased sensation on R leg compared to L leg.         Assessment & Plan:

## 2011-12-27 NOTE — Assessment & Plan Note (Addendum)
Stable, Some bowel incontenence since back surgery. No concern for worsening symptoms per patients history  Continue motrin 800 TID Added percocet 5 q6 PRN Deferred toradol today because last creatinine 1.2 when her baseline previously looked to be 0.9-1.  Also started cymbalta 30 qd

## 2011-12-27 NOTE — Assessment & Plan Note (Signed)
Worsening R knee pain since April of this year, already referred to ortho at [facility] and on waiting list.  Did not want narcotics last month, tried ultram without success.    Continue motrin 800 TID Added percocet 5 q6 PRN Deferred toradol today because last creatinine 1.2 when her baseline previously looked to be 0.9-1.  Also started cymbalta 30 qd  Gave rx for voltaren gel, explained it may be expensive and gave hand-written RX for compounded ketoprofen 20% gel to be filled at [facility]- which is cheaper, Explained that she could get whichever was more affordable.

## 2012-03-04 ENCOUNTER — Ambulatory Visit (INDEPENDENT_AMBULATORY_CARE_PROVIDER_SITE_OTHER): Payer: Medicare Other | Admitting: *Deleted

## 2012-03-04 ENCOUNTER — Telehealth: Payer: Self-pay | Admitting: Family Medicine

## 2012-03-04 DIAGNOSIS — Z309 Encounter for contraceptive management, unspecified: Secondary | ICD-10-CM | POA: Diagnosis not present

## 2012-03-04 DIAGNOSIS — N938 Other specified abnormal uterine and vaginal bleeding: Secondary | ICD-10-CM

## 2012-03-04 DIAGNOSIS — N949 Unspecified condition associated with female genital organs and menstrual cycle: Secondary | ICD-10-CM

## 2012-03-04 DIAGNOSIS — N925 Other specified irregular menstruation: Secondary | ICD-10-CM | POA: Diagnosis not present

## 2012-03-04 LAB — POCT URINE PREGNANCY: Preg Test, Ur: NEGATIVE

## 2012-03-04 MED ORDER — MEDROXYPROGESTERONE ACETATE 150 MG/ML IM SUSP
150.0000 mg | Freq: Once | INTRAMUSCULAR | Status: AC
  Administered 2012-03-04: 150 mg via INTRAMUSCULAR

## 2012-03-04 MED ORDER — MEDROXYPROGESTERONE ACETATE 150 MG/ML IM SUSP: 150.0000 mg | INTRAMUSCULAR | Status: DC

## 2012-03-04 NOTE — Telephone Encounter (Signed)
Message copied by DE Michel Bickers on Wed Mar 04, 2012 11:28 AM ------      Message from: Shannon Stuart      Created: Wed Mar 04, 2012  9:59 AM        Please enter  standing order for Depo Provera 150 mg  IM every 3 months. Coming in today. Larita Fife

## 2012-03-04 NOTE — Telephone Encounter (Signed)
Med ordered

## 2012-03-04 NOTE — Progress Notes (Signed)
Patient states she received last Depo on  12/27/2011 however there is no documentation about this. If she  did received on 12/27/2011 she would not be due until 03/13/2013. Consulted with Dr. Sheffield Slider and he advises Ok to give today .   Next depo due 26 through June 03, 2012

## 2012-03-10 ENCOUNTER — Ambulatory Visit: Payer: Self-pay | Admitting: Family Medicine

## 2012-03-12 ENCOUNTER — Encounter: Payer: Self-pay | Admitting: Family Medicine

## 2012-03-12 ENCOUNTER — Ambulatory Visit (INDEPENDENT_AMBULATORY_CARE_PROVIDER_SITE_OTHER): Payer: Medicare Other | Admitting: Family Medicine

## 2012-03-12 VITALS — BP 134/85 | HR 73 | Temp 98.9°F | Wt 239.5 lb

## 2012-03-12 DIAGNOSIS — M25569 Pain in unspecified knee: Secondary | ICD-10-CM | POA: Diagnosis not present

## 2012-03-12 DIAGNOSIS — M25561 Pain in right knee: Secondary | ICD-10-CM

## 2012-03-12 DIAGNOSIS — Z791 Long term (current) use of non-steroidal anti-inflammatories (NSAID): Secondary | ICD-10-CM | POA: Diagnosis not present

## 2012-03-12 MED ORDER — OXYCODONE-ACETAMINOPHEN 5-325 MG PO TABS: 1.0000 | ORAL_TABLET | Freq: Three times a day (TID) | ORAL | Status: DC | PRN

## 2012-03-13 ENCOUNTER — Telehealth: Payer: Self-pay | Admitting: *Deleted

## 2012-03-13 NOTE — Progress Notes (Signed)
  Subjective:    Patient ID: Shannon Stuart, female    DOB: 09/22/1960, 51 y.o.   MRN: [id number]  HPI  R Knee pain:  Patient here for follow up since last same day appointment 10/4.  At that time, patient states that pain has been worsening since April, sometimes gives out and causes falls.  Motrin helping some and she has been taking it almost daily.  Ultram no longer helping and she is not willing to try Elavil again. Wears a knee brace daily, but not today because it is in the wash. No trauma recently.  Pain is keeping her up at night, tosses and turns due to both chronic low back and RT knee pain.  She was given a Rx for Roxicet ast last visit and Toradol IM was held due to bump in Cr from 0.9 to 1.2.  Referral for Ortho was pending, but at that time she had an orange card.  Now patient has Medicare.  She would like to be seen by Ortho to discuss surgical options.  She wants to hold off on PT for now.  Denies any fever, chills, NS, or vomiting.  Denies any paresthesias of LE.  Denies any bowel or bladder incontinence.  Review of Systems  Per HPI    Objective:   Physical Exam Filed Vitals:   03/12/12 1635  BP: 134/85  Pulse: 73  Temp: 98.9 F (37.2 C)  Gen: NAD, alert, cooperative  MSK: No edema RT Knee: + effusion, but otherwise normal to inspection without bony abnormalities Mildly tender on palpation, but no warmth  ROM LIMITED in flexion and extension + crepitus with movement     Assessment & Plan:

## 2012-03-13 NOTE — Telephone Encounter (Signed)
Call to inform Nakyah of her Orthopedic appointment.  Westyn requested I send Dr. Tye Savoy a message to remind her to fill her Protonix for her stomach and also to do 90 days for all prescriptions she sends in.  Will route to Dr. Tye Savoy for review.  Ileana Ladd

## 2012-03-13 NOTE — Assessment & Plan Note (Addendum)
Worsening R knee pain since April of this year.  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  Minimal pain relief with Motrin, Tramadol.  Out of Percocet but this was most helpful.   - Referral for Ortho reordered now that patient has Medicare, should be able to be seen in [place] - Toradol 30 IM given in clinic today (Last Cr. 1.2), will repeat BMET in 2 months at follow up visit; advised patient to D/C Motrin in the meantime - Roxicet 5-325 TID PRN # 90 should last 2 months  - Continue knee brace, ice and/or heating pads, leg elevation - Offered PT again, but patient would like to be seen by Ortho first - RTC in 2 months to recheck BMET and follow up chronic pain, or sooner as needed

## 2012-03-16 DIAGNOSIS — M25569 Pain in unspecified knee: Secondary | ICD-10-CM | POA: Diagnosis not present

## 2012-03-16 DIAGNOSIS — M1710 Unilateral primary osteoarthritis, unspecified knee: Secondary | ICD-10-CM | POA: Diagnosis not present

## 2012-03-20 DIAGNOSIS — M1710 Unilateral primary osteoarthritis, unspecified knee: Secondary | ICD-10-CM | POA: Diagnosis not present

## 2012-03-20 MED ORDER — PANTOPRAZOLE SODIUM 40 MG PO TBEC: 40.0000 mg | DELAYED_RELEASE_TABLET | Freq: Every day | ORAL | Status: DC

## 2012-03-20 NOTE — Addendum Note (Signed)
Addended by: Tye Savoy, Tess Potts on: 03/20/2012 08:47 AM   Modules accepted: Orders

## 2012-04-02 DIAGNOSIS — M1710 Unilateral primary osteoarthritis, unspecified knee: Secondary | ICD-10-CM | POA: Diagnosis not present

## 2012-04-02 DIAGNOSIS — M25569 Pain in unspecified knee: Secondary | ICD-10-CM | POA: Diagnosis not present

## 2012-04-29 ENCOUNTER — Encounter: Payer: Self-pay | Admitting: Physician Assistant

## 2012-05-26 ENCOUNTER — Telehealth: Payer: Self-pay | Admitting: Family Medicine

## 2012-05-26 DIAGNOSIS — I10 Essential (primary) hypertension: Secondary | ICD-10-CM

## 2012-05-26 DIAGNOSIS — R079 Chest pain, unspecified: Secondary | ICD-10-CM

## 2012-05-26 NOTE — Telephone Encounter (Signed)
Hi team, I would be happy [facility], but because patient has seen other physicians, it would be more FASTER if [facility] could call [facility] and request the Rx be sent to me electronically.   Thanks, [employer]

## 2012-05-26 NOTE — Telephone Encounter (Signed)
[facility] [address]

## 2012-05-27 MED ORDER — NITROGLYCERIN 0.4 MG SL SUBL: 0.4000 mg | SUBLINGUAL_TABLET | SUBLINGUAL | Status: DC | PRN

## 2012-05-27 MED ORDER — METOPROLOL TARTRATE 25 MG PO TABS: 25.0000 mg | ORAL_TABLET | Freq: Two times a day (BID) | ORAL | Status: DC

## 2012-05-27 MED ORDER — PANTOPRAZOLE SODIUM 40 MG PO TBEC: 40.0000 mg | DELAYED_RELEASE_TABLET | Freq: Every day | ORAL | Status: DC

## 2012-05-27 MED ORDER — CLONAZEPAM 1 MG PO TABS: 1.0000 mg | ORAL_TABLET | Freq: Every day | ORAL | Status: DC | PRN

## 2012-05-27 MED ORDER — DULOXETINE HCL 30 MG PO CPEP: 30.0000 mg | ORAL_CAPSULE | Freq: Every day | ORAL | Status: DC

## 2012-05-27 MED ORDER — CYCLOBENZAPRINE HCL 10 MG PO TABS: 10.0000 mg | ORAL_TABLET | Freq: Three times a day (TID) | ORAL | Status: DC | PRN

## 2012-05-27 MED ORDER — DICLOFENAC SODIUM 1 % TD GEL: 2.0000 g | Freq: Four times a day (QID) | TRANSDERMAL | Status: DC

## 2012-05-27 MED ORDER — LISINOPRIL-HYDROCHLOROTHIAZIDE 20-25 MG PO TABS: 1.0000 | ORAL_TABLET | Freq: Every day | ORAL | Status: DC

## 2012-05-27 NOTE — Telephone Encounter (Signed)
Dr Sherron Flemings Cruz,pt refuses to call pharmacy. With new insurance she would like to switch every [facility],she has gotten her rx from two different pharmacy's and refuses to call them,she state just have doctor write rx and I'll  pick them up.She was very upset. Proposito, Virgel Bouquet

## 2012-05-27 NOTE — Telephone Encounter (Signed)
Will leave Rx at front desk.  Please ask patient to review them before she leaves.  Thanks.

## 2012-05-27 NOTE — Addendum Note (Signed)
Addended by: Tye Savoy, IVY on: 05/27/2012 05:54 PM   Modules accepted: Orders, Medications

## 2012-05-28 ENCOUNTER — Ambulatory Visit (INDEPENDENT_AMBULATORY_CARE_PROVIDER_SITE_OTHER): Payer: Medicare Other | Admitting: *Deleted

## 2012-05-28 ENCOUNTER — Telehealth: Payer: Self-pay | Admitting: Family Medicine

## 2012-05-28 DIAGNOSIS — N925 Other specified irregular menstruation: Secondary | ICD-10-CM | POA: Diagnosis not present

## 2012-05-28 DIAGNOSIS — N938 Other specified abnormal uterine and vaginal bleeding: Secondary | ICD-10-CM

## 2012-05-28 DIAGNOSIS — N949 Unspecified condition associated with female genital organs and menstrual cycle: Secondary | ICD-10-CM

## 2012-05-28 MED ORDER — MEDROXYPROGESTERONE ACETATE 150 MG/ML IM SUSP
150.0000 mg | Freq: Once | INTRAMUSCULAR | Status: AC
  Administered 2012-05-28: 150 mg via INTRAMUSCULAR

## 2012-05-28 NOTE — Telephone Encounter (Signed)
Related message,pt voiced understanding. Shannon Stuart, Shannon Stuart  

## 2012-05-28 NOTE — Progress Notes (Addendum)
Advised patient of need to schedule appointment for follow up regarding continuing Depo  and need for CPE.  Appointment scheduled .   next depo due May 22 through August 27, 2012.

## 2012-05-28 NOTE — Telephone Encounter (Signed)
Pt came in today for apt and picked up her rx's stated that she needed the rx lis/Hctz.

## 2012-06-03 ENCOUNTER — Encounter: Payer: Medicare Other | Admitting: Family Medicine

## 2012-08-18 ENCOUNTER — Encounter: Payer: Self-pay | Admitting: Family Medicine

## 2012-08-18 ENCOUNTER — Ambulatory Visit (INDEPENDENT_AMBULATORY_CARE_PROVIDER_SITE_OTHER): Payer: Medicare Other | Admitting: Family Medicine

## 2012-08-18 VITALS — BP 117/60 | HR 106 | Temp 99.4°F | Ht 68.5 in | Wt 227.0 lb

## 2012-08-18 DIAGNOSIS — M545 Low back pain, unspecified: Secondary | ICD-10-CM

## 2012-08-18 DIAGNOSIS — Z309 Encounter for contraceptive management, unspecified: Secondary | ICD-10-CM

## 2012-08-18 DIAGNOSIS — G43909 Migraine, unspecified, not intractable, without status migrainosus: Secondary | ICD-10-CM | POA: Diagnosis not present

## 2012-08-18 DIAGNOSIS — G4700 Insomnia, unspecified: Secondary | ICD-10-CM

## 2012-08-18 MED ORDER — IBUPROFEN 800 MG PO TABS: 800.0000 mg | ORAL_TABLET | Freq: Three times a day (TID) | ORAL | Status: DC | PRN

## 2012-08-18 MED ORDER — PROMETHAZINE HCL 25 MG/ML IJ SOLN
25.0000 mg | Freq: Once | INTRAMUSCULAR | Status: AC
  Administered 2012-08-18: 25 mg via INTRAMUSCULAR

## 2012-08-18 MED ORDER — MEDROXYPROGESTERONE ACETATE 150 MG/ML IM SUSP
150.0000 mg | Freq: Once | INTRAMUSCULAR | Status: AC
  Administered 2012-08-18: 150 mg via INTRAMUSCULAR

## 2012-08-18 MED ORDER — KETOROLAC TROMETHAMINE 60 MG/2ML IM SOLN
60.0000 mg | Freq: Once | INTRAMUSCULAR | Status: AC
  Administered 2012-08-18: 60 mg via INTRAMUSCULAR

## 2012-08-18 MED ORDER — OXYCODONE-ACETAMINOPHEN 5-325 MG PO TABS: 1.0000 | ORAL_TABLET | Freq: Three times a day (TID) | ORAL | Status: DC | PRN

## 2012-08-18 MED ORDER — ZOLPIDEM TARTRATE 10 MG PO TABS: 10.0000 mg | ORAL_TABLET | Freq: Every evening | ORAL | Status: DC | PRN

## 2012-08-18 NOTE — Patient Instructions (Addendum)
I will refer you to a weight loss and bariatric center.  We will call you with time and date. If Ambien causes sleep walking or nightmares, please stop taking it. Take Roxicet every 2-3 times as needed for low back and knee pain. Schedule follow up appointment with me in 4 weeks.

## 2012-08-18 NOTE — Assessment & Plan Note (Signed)
Will refill Ambien.  Discussed side effects with patient.  Follow up as needed.

## 2012-08-18 NOTE — Assessment & Plan Note (Signed)
LBP also followed by Dr. Sheran Fava who recommends surgery, but patient needs to lose weight first.  Will refer patient to Weight Loss/Bariatric center.  For pain, will refill Motrin and Roxicet x 30 day supply.  Follow up as needed.

## 2012-08-18 NOTE — Progress Notes (Signed)
  Subjective:    Patient ID: Shannon Stuart, female    DOB: [birth year], 52 y.o.   MRN: [id number]  HPI  Patient presents to clinic to discuss low back pain and insomnia.  She has seen Dr. Sheran Fava and discussed spinal surgery, but was advised to lose weight prior to surgery.  Pain is located low back and is worse with exertion, better with rest.  She has tried to walk but this worsens knee pain.  Pain scale 8/10 today.  She has run out of Roxicet and asking for refill for both Roxicet and Motrin.  She has been trying to diet, but would like to start a diet pill.  She is not interested in bariatric surgery at this time, but agrees to going a weight loss/bariatric center.  Patient not sleeping well at night due to low back pain.  She is requesting refill Ambien.  She ran out of medication in February but could not afford to see PCP for refills.  She is aware of the side effects and denies any nightmares or sleepwalking.  She takes Ambien only as needed, not every night.   Review of Systems Per HPI    Objective:   Physical Exam  Constitutional: She appears well-nourished.  HENT:  Head: Normocephalic and atraumatic.  Cardiovascular: Normal rate and regular rhythm.   Pulmonary/Chest: Effort normal and breath sounds normal.  Abdominal: Soft.  Musculoskeletal: She exhibits no edema.  Back: limited flexion and extension; unable to bend over and touch toes; normal gait      Assessment & Plan:

## 2012-08-26 ENCOUNTER — Telehealth: Payer: Self-pay | Admitting: *Deleted

## 2012-08-26 DIAGNOSIS — R079 Chest pain, unspecified: Secondary | ICD-10-CM

## 2012-08-26 MED ORDER — CYCLOBENZAPRINE HCL 10 MG PO TABS: 10.0000 mg | ORAL_TABLET | Freq: Every day | ORAL | Status: DC | PRN

## 2012-08-26 MED ORDER — CLONAZEPAM 1 MG PO TABS: 1.0000 mg | ORAL_TABLET | Freq: Every day | ORAL | Status: DC | PRN

## 2012-08-26 NOTE — Telephone Encounter (Signed)
Will print and fax to pharmacy.

## 2012-08-26 NOTE — Addendum Note (Signed)
Addended by: Tye Savoy, IVY on: 08/26/2012 12:49 PM   Modules accepted: Orders

## 2012-09-03 ENCOUNTER — Other Ambulatory Visit: Payer: Self-pay | Admitting: *Deleted

## 2012-09-03 ENCOUNTER — Telehealth: Payer: Self-pay | Admitting: Family Medicine

## 2012-09-03 DIAGNOSIS — R079 Chest pain, unspecified: Secondary | ICD-10-CM

## 2012-09-03 MED ORDER — CLONAZEPAM 1 MG PO TABS: 1.0000 mg | ORAL_TABLET | Freq: Three times a day (TID) | ORAL | Status: DC | PRN

## 2012-09-03 MED ORDER — CYCLOBENZAPRINE HCL 10 MG PO TABS: 10.0000 mg | ORAL_TABLET | Freq: Every day | ORAL | Status: DC | PRN

## 2012-09-03 MED ORDER — CLONAZEPAM 1 MG PO TABS: 1.0000 mg | ORAL_TABLET | Freq: Every day | ORAL | Status: DC | PRN

## 2012-09-03 NOTE — Telephone Encounter (Signed)
I will send new Rx to pharmacy.  Please disregard first Rx.  Thanks.

## 2012-09-03 NOTE — Telephone Encounter (Signed)
Pt takes flexeril and clozapham 3 times a day. Prescription only allows for 1 a day. Pharmacy [facility] [address].  Please advise

## 2012-09-03 NOTE — Addendum Note (Signed)
Addended by: Tye Savoy, IVY on: 09/03/2012 05:01 PM   Modules accepted: Orders

## 2012-09-04 NOTE — Telephone Encounter (Signed)
Spoke with patient and informed her of below 

## 2012-09-18 ENCOUNTER — Telehealth: Payer: Self-pay | Admitting: Family Medicine

## 2012-09-18 MED ORDER — OXYCODONE-ACETAMINOPHEN 5-325 MG PO TABS: 1.0000 | ORAL_TABLET | Freq: Three times a day (TID) | ORAL | Status: DC | PRN

## 2012-09-18 NOTE — Telephone Encounter (Signed)
Will print Rx today and give to [facility].  Patient has an appt with new doctor next week.

## 2012-09-18 NOTE — Telephone Encounter (Signed)
Pt would like a refill on Roxicet.  Also, pt experiencing an increase in her low back pain and knee pain.  She is requesting a shot for the pain.  Informed pt she would need an appt to discuss with MD.  Pt will call and schedule appt for next week.  Will fwd to MD for review of medication refill.  FYI:  Pt is on her way here with her sister who is sick. Emilie Rutter, Darlyne Russian, CMA

## 2012-09-18 NOTE — Telephone Encounter (Signed)
Patient is requesting a refill on Oxycodone 5 mg and a pain shot if possible Jw

## 2012-09-30 ENCOUNTER — Ambulatory Visit (INDEPENDENT_AMBULATORY_CARE_PROVIDER_SITE_OTHER): Payer: Medicare Other | Admitting: Family Medicine

## 2012-09-30 ENCOUNTER — Encounter: Payer: Self-pay | Admitting: Family Medicine

## 2012-09-30 VITALS — BP 124/85 | HR 71 | Temp 98.7°F | Ht 68.6 in | Wt 226.2 lb

## 2012-09-30 DIAGNOSIS — M545 Low back pain, unspecified: Secondary | ICD-10-CM | POA: Diagnosis not present

## 2012-09-30 DIAGNOSIS — E669 Obesity, unspecified: Secondary | ICD-10-CM

## 2012-09-30 DIAGNOSIS — M1710 Unilateral primary osteoarthritis, unspecified knee: Secondary | ICD-10-CM

## 2012-09-30 DIAGNOSIS — R079 Chest pain, unspecified: Secondary | ICD-10-CM | POA: Diagnosis not present

## 2012-09-30 DIAGNOSIS — M170 Bilateral primary osteoarthritis of knee: Secondary | ICD-10-CM

## 2012-09-30 LAB — BASIC METABOLIC PANEL
BUN: 21 mg/dL (ref 6–23)
CO2: 27 mEq/L (ref 19–32)
Calcium: 9.1 mg/dL (ref 8.4–10.5)
Chloride: 105 mEq/L (ref 96–112)
Creat: 1.13 mg/dL — ABNORMAL HIGH (ref 0.50–1.10)
Glucose, Bld: 93 mg/dL (ref 70–99)
Potassium: 3.9 mEq/L (ref 3.5–5.3)
Sodium: 140 mEq/L (ref 135–145)

## 2012-09-30 MED ORDER — KETOROLAC TROMETHAMINE 30 MG/ML IJ SOLN
30.0000 mg | Freq: Once | INTRAMUSCULAR | Status: AC
  Administered 2012-09-30: 30 mg via INTRAMUSCULAR

## 2012-09-30 MED ORDER — KETOROLAC TROMETHAMINE 30 MG/ML IM SOLN: 30.0000 mg | Freq: Once | INTRAMUSCULAR | Status: DC

## 2012-09-30 MED ORDER — OXYCODONE-ACETAMINOPHEN 5-325 MG PO TABS: 1.0000 | ORAL_TABLET | Freq: Three times a day (TID) | ORAL | Status: DC | PRN

## 2012-09-30 MED ORDER — CYCLOBENZAPRINE HCL 10 MG PO TABS: 10.0000 mg | ORAL_TABLET | Freq: Every day | ORAL | Status: DC | PRN

## 2012-09-30 MED ORDER — CLONAZEPAM 1 MG PO TABS: 1.0000 mg | ORAL_TABLET | Freq: Three times a day (TID) | ORAL | Status: DC | PRN

## 2012-09-30 NOTE — Patient Instructions (Addendum)
If you continue having issues with your knees, make an appointment with me for knee injection.

## 2012-10-03 NOTE — Assessment & Plan Note (Signed)
Likely from osteoarthritis. It appears that her orthopedist's recommendation was to undergo full knee replacement, but patient doesn't want to do this for now.  - Encouraged continued efforts for weight loss. Patient walks daily. She may benefit from cycling instead. WIll suggest this at next visit. Also gave her information to make appointment with nutritionist.  - Toradol IM today given generalized pain - may benefit from steroid injection. Patient to come only for this if she is interested.

## 2012-10-03 NOTE — Assessment & Plan Note (Addendum)
Chronic problem for patient, at baseline pain level. Pain in hip appears to be referred pain.  Patient with neuropathic symptoms. She has tried gabapentin in the past which she could not tolerate due to GI side effects. This is unfortunate given that it would likely give her some relief. She may benefit from lyrica. May consider for next visit.  For now, toradol IM 30mg  for generalized pain. Creatinine at 1.2. Will obtain follow up BMP.  Refilled percocet and flexeril

## 2012-10-03 NOTE — Assessment & Plan Note (Signed)
-   patient to call Dr. Gerilyn Pilgrim - continue daily walking
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
## 2012-10-12 ENCOUNTER — Telehealth: Payer: Self-pay | Admitting: Family Medicine

## 2012-10-12 NOTE — Telephone Encounter (Signed)
Pt wanted [place] to know that they found the other prescription it was stuck with another one. JW

## 2012-10-12 NOTE — Telephone Encounter (Signed)
Patient is calling because the Rx for the Roxicet was not with the other two Rx's from 7/9.

## 2012-10-12 NOTE — Telephone Encounter (Signed)
Called pt. Informed that at her last OV 09-30-12 she received a hard copy for the roxicet # 90. She said, that she would check again and call us back, once she finds it.  Fwd. To Dr.Losq for info. Shannon Stuart, Shannon Stuart

## 2012-10-29 ENCOUNTER — Encounter: Payer: Self-pay | Admitting: Family Medicine

## 2012-11-04 ENCOUNTER — Ambulatory Visit (INDEPENDENT_AMBULATORY_CARE_PROVIDER_SITE_OTHER): Payer: Medicare Other | Admitting: *Deleted

## 2012-11-04 DIAGNOSIS — Z309 Encounter for contraceptive management, unspecified: Secondary | ICD-10-CM | POA: Diagnosis not present

## 2012-11-04 DIAGNOSIS — N925 Other specified irregular menstruation: Secondary | ICD-10-CM | POA: Diagnosis not present

## 2012-11-04 DIAGNOSIS — N949 Unspecified condition associated with female genital organs and menstrual cycle: Secondary | ICD-10-CM

## 2012-11-04 DIAGNOSIS — N938 Other specified abnormal uterine and vaginal bleeding: Secondary | ICD-10-CM

## 2012-11-04 MED ORDER — MEDROXYPROGESTERONE ACETATE 150 MG/ML IM SUSP
150.0000 mg | Freq: Once | INTRAMUSCULAR | Status: AC
  Administered 2012-11-04: 150 mg via INTRAMUSCULAR

## 2012-11-04 NOTE — Progress Notes (Signed)
Pt here for depo. Depo given LUOQ. Next depo dueoct 29-nov 12 Wyatt Haste, RN-BSN

## 2012-11-20 ENCOUNTER — Other Ambulatory Visit: Payer: Self-pay | Admitting: Family Medicine

## 2012-11-20 DIAGNOSIS — R079 Chest pain, unspecified: Secondary | ICD-10-CM

## 2012-11-20 NOTE — Telephone Encounter (Signed)
Patient states she is out of her pain meds. States she called pharmacy first, but they have had no response.

## 2012-11-24 NOTE — Telephone Encounter (Signed)
Will fwd to MD.  Jaysion Ramseyer L, CMA  

## 2012-11-24 NOTE — Telephone Encounter (Signed)
Patient has been waiting since Friday for her refills.  The refill request came in on 8/29 and it has been more that 48 hours and she is out of her medicine.  Last office visit was in July.

## 2012-11-24 NOTE — Telephone Encounter (Signed)
Please advise.see notes below Wyatt Haste, RN-BSN

## 2012-11-25 MED ORDER — CYCLOBENZAPRINE HCL 10 MG PO TABS: 10.0000 mg | ORAL_TABLET | Freq: Every day | ORAL | Status: DC | PRN

## 2012-11-26 ENCOUNTER — Telehealth: Payer: Self-pay | Admitting: Family Medicine

## 2012-11-26 NOTE — Telephone Encounter (Signed)
Pt is still calling for her pain medication. She has an appointment on 9/12 but she feels so should still be able to get pain medication without seeing the doctor. She would like Dr. Gwenlyn Saran to call her about this. JW

## 2012-11-26 NOTE — Telephone Encounter (Signed)
Left message that refills have been refilled and that pt will need appointment to refill narcotics - please call for any questions. Wyatt Haste, RN-BSN

## 2012-11-27 ENCOUNTER — Other Ambulatory Visit: Payer: Self-pay | Admitting: Family Medicine

## 2012-11-27 MED ORDER — OXYCODONE-ACETAMINOPHEN 5-325 MG PO TABS: 1.0000 | ORAL_TABLET | Freq: Three times a day (TID) | ORAL | Status: DC | PRN

## 2012-12-04 ENCOUNTER — Encounter: Payer: Self-pay | Admitting: Family Medicine

## 2012-12-04 ENCOUNTER — Ambulatory Visit (INDEPENDENT_AMBULATORY_CARE_PROVIDER_SITE_OTHER): Payer: Medicare Other | Admitting: Family Medicine

## 2012-12-04 VITALS — BP 130/80 | HR 60 | Ht 68.5 in | Wt 230.0 lb

## 2012-12-04 DIAGNOSIS — M545 Low back pain, unspecified: Secondary | ICD-10-CM | POA: Diagnosis not present

## 2012-12-04 DIAGNOSIS — M1710 Unilateral primary osteoarthritis, unspecified knee: Secondary | ICD-10-CM

## 2012-12-04 DIAGNOSIS — M170 Bilateral primary osteoarthritis of knee: Secondary | ICD-10-CM

## 2012-12-04 MED ORDER — METHYLPREDNISOLONE ACETATE 40 MG/ML IJ SUSP
40.0000 mg | Freq: Once | INTRAMUSCULAR | Status: AC
  Administered 2012-12-04: 40 mg via INTRA_ARTICULAR

## 2012-12-04 MED ORDER — OXYCODONE-ACETAMINOPHEN 5-325 MG PO TABS: 1.0000 | ORAL_TABLET | Freq: Three times a day (TID) | ORAL | Status: DC | PRN

## 2012-12-04 MED ORDER — CLONAZEPAM 1 MG PO TABS: 1.0000 mg | ORAL_TABLET | Freq: Three times a day (TID) | ORAL | Status: DC | PRN

## 2012-12-04 MED ORDER — LISINOPRIL-HYDROCHLOROTHIAZIDE 20-25 MG PO TABS: 1.0000 | ORAL_TABLET | Freq: Every day | ORAL | Status: DC

## 2012-12-04 MED ORDER — CYCLOBENZAPRINE HCL 10 MG PO TABS: 10.0000 mg | ORAL_TABLET | Freq: Three times a day (TID) | ORAL | Status: DC | PRN

## 2012-12-04 NOTE — Progress Notes (Signed)
Patient ID: Shannon Stuart    DOB: May 08, 1960, 52 y.o.   MRN: [id number] --- Subjective:  Shannon Stuart is a 52 y.o.female who presents for follow up on chronic back and knee pain.  - knee pain: worst in right than left. Has been ongoing for a couple of years. She was seen by ortho who recommended bilateral knee replacement which she doesn't want due to concern of difficult recovery after surgrey. She reports tearing pain at the back of her right knee worst with bending the knee, going up and down stairs. Otherwise, the rest of her Pain is located thoughout knee cap and below it, it is a throbbing constant pain. She had some increased swelling of the knee prior to this visit but used ice which brought swelling down.  She reports times of knee locking up.She ahs heard pops in her knee as well. Left knee hurts as well, but not as much as right knee. She has had some loss of balance and falls due to the pain and instability in the knee.  She takes ibuprofen 800mg  tid, percocet 5/325 tid and flexeril with some control of back and knees.  She walks 1/2 mile per day.   - chronic back pain: lower back, no change in nature. Gripping pain that radiates down to her foot past her knee, in both legs but R>L  ROS: see HPI Past Medical History: reviewed and updated medications and allergies. Social History: Tobacco: none  Objective: Filed Vitals:   12/04/12 0914  BP: 130/80  Pulse: 60    Physical Examination:   General appearance - alert, well appearing, and in no distress MSK - tenderness to light touch along lumbar spine and paralumbar muscles bilaterally Right knee: mild suprapatellar effusion with some warmth to touch, no erythema, no signs of infection Pain with knee extension and flexion, flexion to 45degrees. Tenderness to light touch throughout join.  Left knee: tenderness along patella and medial and lateral joint lines less than right Decreased strength with dorsiflexion of foot bilaterally, normal  plantarflexion, 4+/5 strength with knee flexion and extension bilaterally  Procedure:  Procedure: right knee steroid injection Consent signed and scanned into record. emla cream used 15-44minutes prior to procedure Medication:  1 cc Depomedrol  4 cc Lidocaine 1% without epi Preparation: area cleansed with alcohol and betadine Time Out taken  Landmarks identified 5 cc of medication injected into joint space using a medial approach Patient tolerated well without bleeding or paresthesias  Patient had good range of motion of joint after injection

## 2012-12-04 NOTE — Patient Instructions (Addendum)
Follow up in 1 month for refill on the pain medicine.     Knee Injection Joint injections are shots. Your caregiver will place a needle into your knee joint. The needle is used to put medicine into the joint. These shots can be used to help treat different painful knee conditions such as osteoarthritis, bursitis, local flare-ups of rheumatoid arthritis, and pseudogout. Anti-inflammatory medicines such as corticosteroids and anesthetics are the most common medicines used for joint and soft tissue injections.  PROCEDURE  The skin over the kneecap will be cleaned with an antiseptic solution.  Your caregiver will inject a small amount of a local anesthetic (a medicine like Novocaine) just under the skin in the area that was cleaned.  After the area becomes numb, a second injection is done. This second injection usually includes an anesthetic and an anti-inflammatory medicine called a steroid or cortisone. The needle is carefully placed in between the kneecap and the knee, and the medicine is injected into the joint space.  After the injection is done, the needle is removed. Your caregiver may place a bandage over the injection site. The whole procedure takes no more than a couple of minutes. BEFORE THE PROCEDURE  Wash all of the skin around the entire knee area. Try to remove any loose, scaling skin. There is no other specific preparation necessary unless advised otherwise by your caregiver. LET YOUR CAREGIVER KNOW ABOUT:   Allergies.  Medications taken including herbs, eye drops, over the counter medications, and creams.  Use of steroids (by mouth or creams).  Possible pregnancy, if applicable.  Previous problems with anesthetics or Novocaine.  History of blood clots (thrombophlebitis).  History of bleeding or blood problems.  Previous surgery.  Other health problems. RISKS AND COMPLICATIONS Side effects from cortisone shots are rare. They include:   Slight bruising of the  skin.  Shrinkage of the normal fatty tissue under the skin where the shot was given.  Increase in pain after the shot.  Infection.  Weakening of tendons or tendon rupture.  Allergic reaction to the medicine.  Diabetics may have a temporary increase in their blood sugar after a shot.  Cortisone can temporarily weaken the immune system. While receiving these shots, you should not get certain vaccines. Also, avoid contact with anyone who has chickenpox or measles. Especially if you have never had these diseases or have not been previously immunized. Your immune system may not be strong enough to fight off the infection while the cortisone is in your system. AFTER THE PROCEDURE   You can go home after the procedure.  You may need to put ice on the joint 15-20 minutes every 3 or 4 hours until the pain goes away.  You may need to put an elastic bandage on the joint. HOME CARE INSTRUCTIONS   Only take over-the-counter or prescription medicines for pain, discomfort, or fever as directed by your caregiver.  You should avoid stressing the joint. Unless advised otherwise, avoid activities that put a lot of pressure on a knee joint, such as:  Jogging.  Bicycling.  Recreational climbing.  Hiking.  Laying down and elevating the leg/knee above the level of your heart can help to minimize swelling. SEEK MEDICAL CARE IF:   You have repeated or worsening swelling.  There is drainage from the puncture area.  You develop red streaking that extends above or below the site where the needle was inserted. SEEK IMMEDIATE MEDICAL CARE IF:   You develop a fever.  You have  pain that gets worse even though you are taking pain medicine.  The area is red and warm, and you have trouble moving the joint. MAKE SURE YOU:   Understand these instructions.  Will watch your condition.  Will get help right away if you are not doing well or get worse. Document Released: 06/02/2006 Document Revised:  06/03/2011 Document Reviewed: 02/27/2007 [facility] Patient Information 2014 [place], [place].

## 2012-12-04 NOTE — Assessment & Plan Note (Addendum)
Depo medrol injection in right knee. Unclear if this will have much benefit. Did not aspirate given small amount of joint effusion appreciated as well as patient being hypersensitive to touch in the area.   - refilled percocet  - continue ibuprofen - cane Rx provided for stability in setting of recent falls - follow up in 1 month

## 2012-12-05 ENCOUNTER — Encounter (HOSPITAL_COMMUNITY): Payer: Self-pay | Admitting: *Deleted

## 2012-12-05 ENCOUNTER — Emergency Department (HOSPITAL_COMMUNITY)
Admission: EM | Admit: 2012-12-05 | Discharge: 2012-12-05 | Disposition: A | Discharge status: Home / Self Care | Payer: Medicare Other | Attending: Emergency Medicine | Admitting: Emergency Medicine

## 2012-12-05 DIAGNOSIS — Z8742 Personal history of other diseases of the female genital tract: Secondary | ICD-10-CM | POA: Diagnosis not present

## 2012-12-05 DIAGNOSIS — Z862 Personal history of diseases of the blood and blood-forming organs and certain disorders involving the immune mechanism: Secondary | ICD-10-CM | POA: Diagnosis not present

## 2012-12-05 DIAGNOSIS — Z8639 Personal history of other endocrine, nutritional and metabolic disease: Secondary | ICD-10-CM | POA: Insufficient documentation

## 2012-12-05 DIAGNOSIS — Z79899 Other long term (current) drug therapy: Secondary | ICD-10-CM | POA: Diagnosis not present

## 2012-12-05 DIAGNOSIS — F411 Generalized anxiety disorder: Secondary | ICD-10-CM | POA: Insufficient documentation

## 2012-12-05 DIAGNOSIS — K5289 Other specified noninfective gastroenteritis and colitis: Secondary | ICD-10-CM | POA: Insufficient documentation

## 2012-12-05 DIAGNOSIS — I10 Essential (primary) hypertension: Secondary | ICD-10-CM | POA: Diagnosis not present

## 2012-12-05 DIAGNOSIS — F329 Major depressive disorder, single episode, unspecified: Secondary | ICD-10-CM | POA: Insufficient documentation

## 2012-12-05 DIAGNOSIS — F3289 Other specified depressive episodes: Secondary | ICD-10-CM | POA: Insufficient documentation

## 2012-12-05 DIAGNOSIS — K529 Noninfective gastroenteritis and colitis, unspecified: Secondary | ICD-10-CM

## 2012-12-05 LAB — CBC WITH DIFFERENTIAL/PLATELET
Basophils Absolute: 0 10*3/uL (ref 0.0–0.1)
Basophils Relative: 0 % (ref 0–1)
Eosinophils Absolute: 0.1 10*3/uL (ref 0.0–0.7)
Eosinophils Relative: 0 % (ref 0–5)
HCT: 38.5 % (ref 36.0–46.0)
Hemoglobin: 12.9 g/dL (ref 12.0–15.0)
Lymphocytes Relative: 22 % (ref 12–46)
Lymphs Abs: 2.5 10*3/uL (ref 0.7–4.0)
MCH: 29.1 pg (ref 26.0–34.0)
MCHC: 33.5 g/dL (ref 30.0–36.0)
MCV: 86.9 fL (ref 78.0–100.0)
Monocytes Absolute: 0.6 10*3/uL (ref 0.1–1.0)
Monocytes Relative: 5 % (ref 3–12)
Neutro Abs: 8.5 10*3/uL — ABNORMAL HIGH (ref 1.7–7.7)
Neutrophils Relative %: 73 % (ref 43–77)
Platelets: 198 10*3/uL (ref 150–400)
RBC: 4.43 MIL/uL (ref 3.87–5.11)
RDW: 16.1 % — ABNORMAL HIGH (ref 11.5–15.5)
WBC: 11.7 10*3/uL — ABNORMAL HIGH (ref 4.0–10.5)

## 2012-12-05 LAB — COMPREHENSIVE METABOLIC PANEL
ALT: 19 U/L (ref 0–35)
AST: 17 U/L (ref 0–37)
Albumin: 4.3 g/dL (ref 3.5–5.2)
Alkaline Phosphatase: 66 U/L (ref 39–117)
BUN: 14 mg/dL (ref 6–23)
CO2: 25 mEq/L (ref 19–32)
Calcium: 9.3 mg/dL (ref 8.4–10.5)
Chloride: 102 mEq/L (ref 96–112)
Creatinine, Ser: 0.94 mg/dL (ref 0.50–1.10)
GFR calc Af Amer: 78 mL/min — ABNORMAL LOW (ref >90)
GFR calc non Af Amer: 68 mL/min — ABNORMAL LOW (ref >90)
Glucose, Bld: 130 mg/dL — ABNORMAL HIGH (ref 70–99)
Potassium: 3.5 mEq/L (ref 3.5–5.1)
Sodium: 141 mEq/L (ref 135–145)
Total Bilirubin: 0.3 mg/dL (ref 0.3–1.2)
Total Protein: 7.5 g/dL (ref 6.0–8.3)

## 2012-12-05 LAB — LIPASE, BLOOD: Lipase: 29 U/L (ref 11–59)

## 2012-12-05 MED ORDER — PROMETHAZINE HCL 25 MG/ML IJ SOLN
25.0000 mg | Freq: Once | INTRAMUSCULAR | Status: AC
  Administered 2012-12-05: 25 mg via INTRAVENOUS
  Filled 2012-12-05: qty 1

## 2012-12-05 MED ORDER — GLYCOPYRROLATE 0.2 MG/ML IJ SOLN
0.2000 mg | Freq: Once | INTRAMUSCULAR | Status: AC
  Administered 2012-12-05: 0.2 mg via INTRAVENOUS
  Filled 2012-12-05: qty 1

## 2012-12-05 MED ORDER — MORPHINE SULFATE 4 MG/ML IJ SOLN: 4.0000 mg | INTRAMUSCULAR | Status: DC | PRN

## 2012-12-05 MED ORDER — DIPHENOXYLATE-ATROPINE 2.5-0.025 MG PO TABS: 1.0000 | ORAL_TABLET | Freq: Four times a day (QID) | ORAL | Status: DC | PRN

## 2012-12-05 MED ORDER — DICYCLOMINE HCL 20 MG PO TABS: 20.0000 mg | ORAL_TABLET | Freq: Two times a day (BID) | ORAL | Status: DC

## 2012-12-05 MED ORDER — MORPHINE SULFATE 4 MG/ML IJ SOLN
4.0000 mg | INTRAMUSCULAR | Status: DC | PRN
  Administered 2012-12-05 (×2): 4 mg via INTRAVENOUS
  Filled 2012-12-05 (×2): qty 1

## 2012-12-05 MED ORDER — ONDANSETRON HCL 4 MG PO TABS: 4.0000 mg | ORAL_TABLET | Freq: Four times a day (QID) | ORAL | Status: DC

## 2012-12-05 MED ORDER — ONDANSETRON HCL 4 MG/2ML IJ SOLN
4.0000 mg | Freq: Once | INTRAMUSCULAR | Status: AC
  Administered 2012-12-05: 4 mg via INTRAVENOUS
  Filled 2012-12-05: qty 2

## 2012-12-05 MED ORDER — SODIUM CHLORIDE 0.9 % IV BOLUS (SEPSIS)
1000.0000 mL | Freq: Once | INTRAVENOUS | Status: AC
  Administered 2012-12-05: 1000 mL via INTRAVENOUS

## 2012-12-05 MED ORDER — HYDROMORPHONE HCL PF 1 MG/ML IJ SOLN
1.0000 mg | INTRAMUSCULAR | Status: DC | PRN
  Administered 2012-12-05: 1 mg via INTRAVENOUS
  Filled 2012-12-05: qty 1

## 2012-12-05 NOTE — ED Notes (Signed)
Pt reports onset of n/v/d at 0400 this morning. States she has vomited over 40 times. Reports chills. States she received a cortisone shot in knee yesterday. Is having muscle spasms from vomiting.

## 2012-12-05 NOTE — Assessment & Plan Note (Signed)
Chronic problem.  On percocet, ibuprofen, flexeril. Had been on gabapentin which she did not tolerate due to GI side effects. She also has had symptomatic relief for 1 week with toradol injections. Cautioned her on too many toradol injections especially since her most recent Cr was 1.13.  - follow up in 1 month for med refills and monitoring of pain.

## 2012-12-05 NOTE — ED Provider Notes (Signed)
CSN: [id number]     Arrival date & time 12/05/12  1458 History   First MD Initiated Contact with Patient 12/05/12 1532     Chief Complaint  Patient presents with  . Emesis  . Diarrhea    HPI   52 year old female in her normal state of health until early this morning. She waking at approximately 4 AM with nausea. She's had almost constant feeling of nausea and emesis since that time. Heme-negative nonbilious emesis. Several episodes of diarrhea. No blood pus or mucus in the stool. No fevers. She states she feels sore all through her abdomen from the vomiting but no localizing abdominal pain. Diffuse muscle aches and muscle spasm. She is [place] last night. Otherwise no unusual dietary intake. Has not been febrile. Past Medical History  Diagnosis Date  . Allergy   . Anxiety   . Depression   . Hyperlipidemia   . Hypertension   . Endometriosis    History reviewed. No pertinent past surgical history. History reviewed. No pertinent family history. History  Substance Use Topics  . Smoking status: Never Smoker   . Smokeless tobacco: Never Used  . Alcohol Use: Yes     Comment: 'SOCIALLY"   OB History   Grav Para Term Preterm Abortions TAB SAB Ect Mult Living   1 1 1       1      Review of Systems  Constitutional: Negative for fever, chills, diaphoresis, appetite change and fatigue.  HENT: Negative for sore throat, mouth sores and trouble swallowing.   Eyes: Negative for visual disturbance.  Respiratory: Negative for cough, chest tightness, shortness of breath and wheezing.   Cardiovascular: Negative for chest pain.  Gastrointestinal: Positive for nausea, vomiting, abdominal pain and diarrhea. Negative for blood in stool and abdominal distention.  Endocrine: Negative for polydipsia, polyphagia and polyuria.  Genitourinary: Negative for dysuria, frequency and hematuria.  Musculoskeletal: Negative for gait problem.  Skin: Negative for color change, pallor and rash.  Neurological:  Negative for dizziness, syncope, light-headedness and headaches.  Hematological: Does not bruise/bleed easily.  Psychiatric/Behavioral: Negative for behavioral problems and confusion.    Allergies  Review of patient's allergies indicates no known allergies.  Home Medications   Current Outpatient Rx  Name  Route  Sig  Dispense  Refill  . clonazePAM (KLONOPIN) 1 MG tablet   Oral   Take 1 tablet (1 mg total) by mouth 3 (three) times daily as needed for anxiety.   90 tablet   0   . cyclobenzaprine (FLEXERIL) 10 MG tablet   Oral   Take 1 tablet (10 mg total) by mouth 3 (three) times daily as needed for muscle spasms. For back pain   90 tablet   1   . ibuprofen (ADVIL,MOTRIN) 800 MG tablet   Oral   Take 1 tablet (800 mg total) by mouth every 8 (eight) hours as needed.   90 tablet   1   . lisinopril-hydrochlorothiazide (PRINZIDE,ZESTORETIC) 20-25 MG per tablet   Oral   Take 1 tablet by mouth daily.   90 tablet   11   . medroxyPROGESTERone (DEPO-PROVERA) 150 MG/ML injection   Intramuscular   Inject 1 mL (150 mg total) into the muscle every 3 (three) months.   1 mL   0   . metoprolol tartrate (LOPRESSOR) 25 MG tablet   Oral   Take 1 tablet (25 mg total) by mouth 2 (two) times daily.   180 tablet   1   . oxyCODONE-acetaminophen (ROXICET)  5-325 MG per tablet   Oral   Take 1 tablet by mouth every 8 (eight) hours as needed for pain.   90 tablet   0   . pantoprazole (PROTONIX) 40 MG tablet      TAKE 1 TABLET BY MOUTH EVERY DAY   90 tablet   1   . zolpidem (AMBIEN) 10 MG tablet   Oral   Take 1 tablet (10 mg total) by mouth at bedtime as needed for sleep.   30 tablet   3   . dicyclomine (BENTYL) 20 MG tablet   Oral   Take 1 tablet (20 mg total) by mouth 2 (two) times daily.   20 tablet   0   . diphenoxylate-atropine (LOMOTIL) 2.5-0.025 MG per tablet   Oral   Take 1 tablet by mouth 4 (four) times daily as needed for diarrhea or loose stools.   30 tablet    0   . nitroGLYCERIN (NITROSTAT) 0.4 MG SL tablet   Sublingual   Place 1 tablet (0.4 mg total) under the tongue every 5 (five) minutes as needed for chest pain.   15 tablet   0   . ondansetron (ZOFRAN) 4 MG tablet   Oral   Take 1 tablet (4 mg total) by mouth every 6 (six) hours.   12 tablet   0    BP 169/83  Pulse 70  Temp(Src) 98.2 F (36.8 C) (Oral)  Resp 18  SpO2 98% Physical Exam  Constitutional: She is oriented to person, place, and time. She appears well-developed and well-nourished. No distress.  Laying supine. Appears uncomfortable, but no distress.  HENT:  Head: Normocephalic.  Eyes: Conjunctivae are normal. Pupils are equal, round, and reactive to light. No scleral icterus.  No scleral icterus. Conjunctiva not pale.  Neck: Normal range of motion. Neck supple. No thyromegaly present.  Cardiovascular: Normal rate and regular rhythm.  Exam reveals no gallop and no friction rub.   No murmur heard. Pulmonary/Chest: Effort normal and breath sounds normal. No respiratory distress. She has no wheezes. She has no rales.  Abdominal: Soft. Bowel sounds are normal.  Benign abdomen. Slightly hypoactive but present bowel sounds. No focal areas of tenderness. No upper quadrant tenderness no peritoneal irritation to  Musculoskeletal: Normal range of motion.  Neurological: She is alert and oriented to person, place, and time.  Skin: Skin is warm and dry. No rash noted.  Psychiatric: She has a normal mood and affect. Her behavior is normal.    ED Course  Procedures (including critical care time) Labs Review Labs Reviewed  CBC WITH DIFFERENTIAL - Abnormal; Notable for the following:    WBC 11.7 (*)    RDW 16.1 (*)    Neutro Abs 8.5 (*)    All other components within normal limits  COMPREHENSIVE METABOLIC PANEL - Abnormal; Notable for the following:    Glucose, Bld 130 (*)    GFR calc non Af Amer 68 (*)    GFR calc Af Amer 78 (*)    All other components within normal limits   LIPASE, BLOOD   Imaging Review No results found.  MDM   1. Gastroenteritis    Symptoms were rather sudden onset on awakening. This is likely a food born, or viral gastroenteritis.  19:36 reevaluation. She has less nausea and some continued cramping pain. Is given some additional phenergan.  20:00  states she feels "much better". She is able to the bathroom. She taken some by mouth liquids. She is  appropriate for discharge. Her abdomen is benign.   Claudean Kinds, MD 12/05/12 2001

## 2013-01-04 ENCOUNTER — Telehealth: Payer: Self-pay | Admitting: Family Medicine

## 2013-01-04 MED ORDER — OXYCODONE-ACETAMINOPHEN 5-325 MG PO TABS: 1.0000 | ORAL_TABLET | Freq: Three times a day (TID) | ORAL | Status: DC | PRN

## 2013-01-04 NOTE — Telephone Encounter (Signed)
Will refill enough to hold her over until next visit percocet 5/325 45 tablets. She can come and pick up Rx at front office.  Marena Chancy, PGY-3 Family Medicine Resident

## 2013-01-04 NOTE — Telephone Encounter (Signed)
Pt notified.  Eisha Chatterjee L, CMA  

## 2013-01-04 NOTE — Telephone Encounter (Signed)
Will fwd to MD.  Shannon Stuart L, CMA  

## 2013-01-04 NOTE — Telephone Encounter (Signed)
Patient requesting refill for Percocet. Patient has appt for 10/31.

## 2013-01-22 ENCOUNTER — Ambulatory Visit (INDEPENDENT_AMBULATORY_CARE_PROVIDER_SITE_OTHER): Payer: Medicare Other | Admitting: Family Medicine

## 2013-01-22 ENCOUNTER — Encounter: Payer: Self-pay | Admitting: Family Medicine

## 2013-01-22 VITALS — BP 142/98 | HR 63 | Temp 98.7°F | Wt 235.0 lb

## 2013-01-22 DIAGNOSIS — IMO0002 Reserved for concepts with insufficient information to code with codable children: Secondary | ICD-10-CM

## 2013-01-22 DIAGNOSIS — M545 Low back pain, unspecified: Secondary | ICD-10-CM

## 2013-01-22 DIAGNOSIS — M1711 Unilateral primary osteoarthritis, right knee: Secondary | ICD-10-CM

## 2013-01-22 DIAGNOSIS — Z23 Encounter for immunization: Secondary | ICD-10-CM | POA: Diagnosis not present

## 2013-01-22 DIAGNOSIS — M1710 Unilateral primary osteoarthritis, unspecified knee: Secondary | ICD-10-CM

## 2013-01-22 DIAGNOSIS — M170 Bilateral primary osteoarthritis of knee: Secondary | ICD-10-CM

## 2013-01-22 DIAGNOSIS — Z309 Encounter for contraceptive management, unspecified: Secondary | ICD-10-CM | POA: Diagnosis not present

## 2013-01-22 MED ORDER — MEDROXYPROGESTERONE ACETATE 150 MG/ML IM SUSP
150.0000 mg | Freq: Once | INTRAMUSCULAR | Status: AC
  Administered 2013-01-22: 150 mg via INTRAMUSCULAR

## 2013-01-22 MED ORDER — ZOLPIDEM TARTRATE 10 MG PO TABS: 10.0000 mg | ORAL_TABLET | Freq: Every evening | ORAL | Status: DC | PRN

## 2013-01-22 MED ORDER — OXYCODONE-ACETAMINOPHEN 5-325 MG PO TABS: 1.0000 | ORAL_TABLET | Freq: Three times a day (TID) | ORAL | Status: DC | PRN

## 2013-01-22 MED ORDER — KETOROLAC TROMETHAMINE 30 MG/ML IJ SOLN
30.0000 mg | Freq: Once | INTRAMUSCULAR | Status: AC
  Administered 2013-01-22: 30 mg via INTRAMUSCULAR

## 2013-01-22 NOTE — Patient Instructions (Signed)
Please make an appointment with Dr. Berline Chough for evaluation of manipulation therapy.   Also, I will put in a referral for you to be seen by Dr. Cleophas Dunker at [facility] for evaluation of viscose supplementation shots.   Follow up in 1 month for medication refill

## 2013-01-23 NOTE — Progress Notes (Signed)
Patient ID: Shannon Stuart    DOB: 04-21-1960, 52 y.o.   MRN: [id number] --- Subjective:  Shannon Stuart is a 52 y.o.female who presents for follow up on knee and back pain: - knee pain: right more than left. Located at front of knee. Worst with walking, still present with sitting. Wakes her from sleep. Some locking occasionally. Had a steroid injection in September which provided relief for 1 week. She   - low back pain: sharp shooting pain from the low back past the knee. Occurs with going up and down stairs. Pain on lateral hip with laying on it. She reports that a couple of weeks ago, she had a large knot on the let side of her lumbar spine which caused a lot of pain and which eventually resolved. No new weakness. No bowel or urinary incontinence.  She takes flexeril, ibuprofen and percoet 5/325 takes 2-4 percocet a day. She exercises regularly at the [facility]. Has a trainer who watches out for any activity or movement that could hurt her. She walks on the treadmill and uses the reccubent bike.   ROS: see HPI Past Medical History: reviewed and updated medications and allergies. Social History: Tobacco: none  Objective: Filed Vitals:   01/22/13 1027  BP: 142/98  Pulse: 63  Temp: 98.7 F (37.1 C)    Physical Examination:   General appearance - alert, well appearing, and in no distress Knee - right knee: diffusely tender along patellar tendon and lateral and medial joint lines, range of motion from 5 to 100 degrees bilaterally, negative McMurrays, normal varus and valgus, negative seated anterior drawer.  Hip - pain with external and internal rotation of the right hip, positive FABER, no ASIS joint tenderness, limited flexion of the hip, 4/5 strength on right compared to 4+/5 on left Back - muscle tightness along paralumbar muscle on right, tenderness along lumbar spine.

## 2013-01-23 NOTE — Assessment & Plan Note (Addendum)
Patient was seen by neurosurgeon and was recommended to have surgery which she doesn't want.  PAtient reports lateral hip pain, which I think is referred pain rather than trochanteric bursitis. Explained to patient that given that I am not entirely sure that it is trochanteric bursitis, would like to hold on injection for now.  With report of muscle spasm in low back, recommended that she be evaluated by Dr. Berline Chough for possible OMT. Patient is open to the idea and will make an appointment with him. Flexeril, percocet refilled. Re-iterated that patient will need to follow up in 1 month for refill on percocet toradol IM given in office today.

## 2013-01-23 NOTE — Assessment & Plan Note (Addendum)
Modest improvement with steroid injection. Will repeat 3 months after last injection.  Patient may benefit from viscose supplementation and is open to the idea. Will refer her to Delbert Harness with Dr. Cleophas Dunker for evaluation for viscose supplementation.

## 2013-02-02 ENCOUNTER — Other Ambulatory Visit: Payer: Self-pay | Admitting: Family Medicine

## 2013-02-02 ENCOUNTER — Ambulatory Visit: Payer: Medicare Other | Admitting: Sports Medicine

## 2013-02-05 ENCOUNTER — Other Ambulatory Visit: Payer: Self-pay | Admitting: Family Medicine

## 2013-02-05 MED ORDER — IBUPROFEN 800 MG PO TABS: 800.0000 mg | ORAL_TABLET | Freq: Three times a day (TID) | ORAL | Status: DC | PRN

## 2013-02-05 NOTE — Telephone Encounter (Signed)
Patient calls inquiring status

## 2013-02-17 ENCOUNTER — Ambulatory Visit (INDEPENDENT_AMBULATORY_CARE_PROVIDER_SITE_OTHER): Payer: Medicare Other | Admitting: Family Medicine

## 2013-02-17 VITALS — BP 136/89 | HR 74 | Ht 68.5 in | Wt 240.0 lb

## 2013-02-17 DIAGNOSIS — M545 Low back pain, unspecified: Secondary | ICD-10-CM | POA: Diagnosis not present

## 2013-02-17 DIAGNOSIS — M170 Bilateral primary osteoarthritis of knee: Secondary | ICD-10-CM

## 2013-02-17 DIAGNOSIS — M1711 Unilateral primary osteoarthritis, right knee: Secondary | ICD-10-CM

## 2013-02-17 DIAGNOSIS — M25519 Pain in unspecified shoulder: Secondary | ICD-10-CM

## 2013-02-17 DIAGNOSIS — M1710 Unilateral primary osteoarthritis, unspecified knee: Secondary | ICD-10-CM | POA: Diagnosis not present

## 2013-02-17 DIAGNOSIS — IMO0002 Reserved for concepts with insufficient information to code with codable children: Secondary | ICD-10-CM

## 2013-02-17 DIAGNOSIS — M25512 Pain in left shoulder: Secondary | ICD-10-CM

## 2013-02-17 MED ORDER — IBUPROFEN 800 MG PO TABS: 800.0000 mg | ORAL_TABLET | Freq: Three times a day (TID) | ORAL | Status: DC | PRN

## 2013-02-17 MED ORDER — OXYCODONE-ACETAMINOPHEN 5-325 MG PO TABS: 1.0000 | ORAL_TABLET | Freq: Three times a day (TID) | ORAL | Status: DC | PRN

## 2013-02-17 MED ORDER — CYCLOBENZAPRINE HCL 10 MG PO TABS: 10.0000 mg | ORAL_TABLET | Freq: Three times a day (TID) | ORAL | Status: DC | PRN

## 2013-02-17 MED ORDER — CLONAZEPAM 1 MG PO TABS: 1.0000 mg | ORAL_TABLET | Freq: Three times a day (TID) | ORAL | Status: DC | PRN

## 2013-02-17 MED ORDER — PREDNISONE 20 MG PO TABS: 60.0000 mg | ORAL_TABLET | Freq: Every day | ORAL | Status: DC

## 2013-02-17 NOTE — Patient Instructions (Signed)
Lets try the prednisone pack to see if this helps with the pain.   Follow up in 2 months with me.   We will work on getting you set up for the knee shots.

## 2013-02-20 DIAGNOSIS — M25519 Pain in unspecified shoulder: Secondary | ICD-10-CM | POA: Insufficient documentation

## 2013-02-20 NOTE — Progress Notes (Signed)
Patient ID: Shannon Stuart    DOB: 03-10-1961, 52 y.o.   MRN: [id number] --- Subjective:  [name] is a 52 y.o.female who presents for medication refill. Her concerns include the following:   - left shoulder pain: fell on the shoulder one day after tripping. This happened 2-3 weeks ago. She initially had a bruise but this has since gone away. She has been having trouble with lifting it above her head or getting dressed in the morning. The pain is in the posterior aspect of her shoulder and along the front of her arm. It is worst when she lays on it. Her trainer at the gym has given her exercise to keep her shoulder mobile which she has been doing.   - low back pain and knee pain: worst with sitting for long periods of time or with standing for long periods of time. Constant pain. No new weakness in lower extremities. She has had some associated muscle cramps along her thighs which are debilitating to the point of not being able to walk for a couple of hours. 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 When the elevator is closed she cannot go up the stairs with the extra weight of her books. Also, she needs to take breaks during her class to walk around, as sitting for a long period of time makes pain worst.  Last toradol shot did not help.  She has not heard back from referral for orthopedics for viscose supplementation.   - vision changes: has been having more blurred vision for the last 2 months lasting 10-23mintes associated with squiggly lines. No headache, no weakness. Has not seen an eye doctor in several years.   ROS: see HPI Past Medical History: reviewed and updated medications and allergies. Social History: Tobacco: none  Objective: Filed Vitals:   02/17/13 1452  BP: 136/89  Pulse: 74    Physical Examination:   General appearance - alert, well appearing, and in no distress Shoulder - decreased range of motion in left shoulder up to 90  degrees. Tenderness along posterior aspect. Normal strength but pain with empty can test, internal and external rotation. Pain but no weakness with speed's.  Back: pain with palpation along lumbar spine, 4+/5 strength with knee extension and flexion bilaterally

## 2013-02-20 NOTE — Assessment & Plan Note (Addendum)
Will re-enter referral for non surgical orthopedics for evaluation for viscosupplementation

## 2013-02-20 NOTE — Assessment & Plan Note (Addendum)
Still problematic without significant relief from medicine. Trial of steroid burst. Refilled percocet, ibuprofenand flexeril. Valium would probably have better effects but patient is already on clonazepam.

## 2013-02-20 NOTE — Assessment & Plan Note (Signed)
Possibly rotator cuff involvement. For now, will continue with range of motion exercises to decrease chances of frozen shoulder. Patient already on percocet for other joint pain issues. Ice around area. Will start prednisone burst for treatment of other joint problems.  If continues to be a problem, will obtain plain xray and consider steroid injection.

## 2013-02-25 ENCOUNTER — Telehealth: Payer: Self-pay | Admitting: Family Medicine

## 2013-02-25 NOTE — Telephone Encounter (Signed)
Filled out Documentation of Physical Impairment Verification Form to [facility] and faxed it (fax number: [phone]) stating that patient's osteoarthritis makes it very difficult for her to go up and down stairs especially with heavy load of books. Recommended working elevator, or if not available, class on ground floor. Also, recommended periods of walking around if sitting for long periods of time >48minutes is too painful.   Marena Chancy, PGY-3 Family Medicine Resident

## 2013-03-30 ENCOUNTER — Ambulatory Visit: Payer: Medicare Other | Admitting: Family Medicine

## 2013-04-02 ENCOUNTER — Emergency Department (HOSPITAL_COMMUNITY): Payer: Medicare Other

## 2013-04-02 ENCOUNTER — Encounter (HOSPITAL_COMMUNITY): Payer: Self-pay | Admitting: Emergency Medicine

## 2013-04-02 ENCOUNTER — Emergency Department (HOSPITAL_COMMUNITY)
Admission: EM | Admit: 2013-04-02 | Discharge: 2013-04-02 | Disposition: A | Discharge status: Home / Self Care | Payer: Medicare Other | Attending: Emergency Medicine | Admitting: Emergency Medicine

## 2013-04-02 DIAGNOSIS — Z79899 Other long term (current) drug therapy: Secondary | ICD-10-CM | POA: Diagnosis not present

## 2013-04-02 DIAGNOSIS — K760 Fatty (change of) liver, not elsewhere classified: Secondary | ICD-10-CM

## 2013-04-02 DIAGNOSIS — E669 Obesity, unspecified: Secondary | ICD-10-CM | POA: Insufficient documentation

## 2013-04-02 DIAGNOSIS — I10 Essential (primary) hypertension: Secondary | ICD-10-CM | POA: Insufficient documentation

## 2013-04-02 DIAGNOSIS — K7689 Other specified diseases of liver: Secondary | ICD-10-CM | POA: Insufficient documentation

## 2013-04-02 DIAGNOSIS — K2970 Gastritis, unspecified, without bleeding: Secondary | ICD-10-CM | POA: Diagnosis not present

## 2013-04-02 DIAGNOSIS — K2990 Gastroduodenitis, unspecified, without bleeding: Principal | ICD-10-CM

## 2013-04-02 DIAGNOSIS — F3289 Other specified depressive episodes: Secondary | ICD-10-CM | POA: Insufficient documentation

## 2013-04-02 DIAGNOSIS — Z8742 Personal history of other diseases of the female genital tract: Secondary | ICD-10-CM | POA: Insufficient documentation

## 2013-04-02 DIAGNOSIS — K219 Gastro-esophageal reflux disease without esophagitis: Secondary | ICD-10-CM | POA: Diagnosis not present

## 2013-04-02 DIAGNOSIS — F411 Generalized anxiety disorder: Secondary | ICD-10-CM | POA: Diagnosis not present

## 2013-04-02 DIAGNOSIS — F329 Major depressive disorder, single episode, unspecified: Secondary | ICD-10-CM | POA: Insufficient documentation

## 2013-04-02 DIAGNOSIS — K2900 Acute gastritis without bleeding: Secondary | ICD-10-CM | POA: Diagnosis not present

## 2013-04-02 DIAGNOSIS — E7889 Other lipoprotein metabolism disorders: Secondary | ICD-10-CM | POA: Diagnosis not present

## 2013-04-02 LAB — COMPREHENSIVE METABOLIC PANEL
ALT: 25 U/L (ref 0–35)
AST: 39 U/L — ABNORMAL HIGH (ref 0–37)
Albumin: 4.2 g/dL (ref 3.5–5.2)
Alkaline Phosphatase: 71 U/L (ref 39–117)
BUN: 14 mg/dL (ref 6–23)
CO2: 24 mEq/L (ref 19–32)
Calcium: 8.9 mg/dL (ref 8.4–10.5)
Chloride: 98 mEq/L (ref 96–112)
Creatinine, Ser: 0.89 mg/dL (ref 0.50–1.10)
GFR calc Af Amer: 84 mL/min — ABNORMAL LOW (ref >90)
GFR calc non Af Amer: 72 mL/min — ABNORMAL LOW (ref >90)
Glucose, Bld: 150 mg/dL — ABNORMAL HIGH (ref 70–99)
Potassium: 4 mEq/L (ref 3.7–5.3)
Sodium: 137 mEq/L (ref 137–147)
Total Bilirubin: 0.5 mg/dL (ref 0.3–1.2)
Total Protein: 7.5 g/dL (ref 6.0–8.3)

## 2013-04-02 LAB — CBC
HCT: 37.8 % (ref 36.0–46.0)
Hemoglobin: 12.7 g/dL (ref 12.0–15.0)
MCH: 28.9 pg (ref 26.0–34.0)
MCHC: 33.6 g/dL (ref 30.0–36.0)
MCV: 85.9 fL (ref 78.0–100.0)
Platelets: 212 10*3/uL (ref 150–400)
RBC: 4.4 MIL/uL (ref 3.87–5.11)
RDW: 15.8 % — ABNORMAL HIGH (ref 11.5–15.5)
WBC: 6.4 10*3/uL (ref 4.0–10.5)

## 2013-04-02 LAB — LIPASE, BLOOD: Lipase: 28 U/L (ref 11–59)

## 2013-04-02 MED ORDER — GI COCKTAIL ~~LOC~~
30.0000 mL | Freq: Once | ORAL | Status: AC
Start: 2013-04-02 — End: 2013-04-02
  Administered 2013-04-02: 30 mL via ORAL
  Filled 2013-04-02: qty 30

## 2013-04-02 MED ORDER — MORPHINE SULFATE 4 MG/ML IJ SOLN
4.0000 mg | Freq: Once | INTRAMUSCULAR | Status: AC
  Administered 2013-04-02: 4 mg via INTRAVENOUS
  Filled 2013-04-02: qty 1

## 2013-04-02 MED ORDER — SODIUM CHLORIDE 0.9 % IV BOLUS (SEPSIS)
1000.0000 mL | Freq: Once | INTRAVENOUS | Status: AC
  Administered 2013-04-02: 1000 mL via INTRAVENOUS

## 2013-04-02 MED ORDER — ONDANSETRON HCL 4 MG PO TABS: 4.0000 mg | ORAL_TABLET | Freq: Four times a day (QID) | ORAL | Status: DC

## 2013-04-02 MED ORDER — HYDROMORPHONE HCL PF 1 MG/ML IJ SOLN
1.0000 mg | Freq: Once | INTRAMUSCULAR | Status: AC
  Administered 2013-04-02: 1 mg via INTRAVENOUS
  Filled 2013-04-02: qty 1

## 2013-04-02 MED ORDER — PROMETHAZINE HCL 25 MG/ML IJ SOLN
25.0000 mg | Freq: Once | INTRAMUSCULAR | Status: AC
  Administered 2013-04-02: 25 mg via INTRAVENOUS
  Filled 2013-04-02: qty 1

## 2013-04-02 MED ORDER — ONDANSETRON HCL 4 MG/2ML IJ SOLN: 4.0000 mg | Freq: Once | INTRAMUSCULAR | Status: DC

## 2013-04-02 MED ORDER — PROMETHAZINE HCL 25 MG RE SUPP: 25.0000 mg | Freq: Four times a day (QID) | RECTAL | Status: DC | PRN

## 2013-04-02 NOTE — ED Notes (Signed)
Bed: WA01 Expected date:  Expected time:  Means of arrival:  Comments: 

## 2013-04-02 NOTE — Discharge Instructions (Signed)
Call for a follow up appointment with a Family or Primary Care Provider.  °Return if Symptoms worsen.   °Take medication as prescribed.  ° °

## 2013-04-02 NOTE — ED Notes (Signed)
Per pt, states she has been sick since yesterday-states she is suppose to have gallbladder out

## 2013-04-02 NOTE — ED Provider Notes (Signed)
CSN: [id number]     Arrival date & time 04/02/13  1115 History   First MD Initiated Contact with Patient 04/02/13 1128     Chief Complaint  Patient presents with  . Emesis   (Consider location/radiation/quality/duration/timing/severity/associated sxs/prior Treatment) HPI Comments: Shannon Stuart is a 53 year-old female with a past medical history of cholelithiasis, GERD,obesity, HTN, presenting the Emergency Department with a chief complaint of non-radiating epigastric abdominal pain since last night.  The patient reports approximately 1 hour after eating broccoli and chedder soup she started vomiting and having abdominal pain. She reports constant discomfort. She reports 50 episodes of emesis without frank blood, denies coffee emesis. She reports 40 episodes of diarrhea without blood. She reports subjective fever at home. No recent antibiotics. PCP: Liam Graham, MD   Patient is a 53 y.o. female presenting with vomiting. The history is provided by the patient and medical records. No language interpreter was used.  Emesis Associated symptoms: abdominal pain and diarrhea   Associated symptoms: no chills     Past Medical History  Diagnosis Date  . Allergy   . Anxiety   . Depression   . Hyperlipidemia   . Hypertension   . Endometriosis    History reviewed. No pertinent past surgical history. No family history on file. History  Substance Use Topics  . Smoking status: Never Smoker   . Smokeless tobacco: Never Used  . Alcohol Use: Yes     Comment: 'SOCIALLY"   OB History   Grav Para Term Preterm Abortions TAB SAB Ect Mult Living   1 1 1       1      Review of Systems  Constitutional: Negative for fever and chills.  Respiratory: Negative for cough and shortness of breath.   Gastrointestinal: Positive for nausea, vomiting, abdominal pain and diarrhea. Negative for constipation, blood in stool, abdominal distention and anal bleeding.  Genitourinary: Negative for dysuria,  hematuria and flank pain.    Allergies  Review of patient's allergies indicates no known allergies.  Home Medications   Current Outpatient Rx  Name  Route  Sig  Dispense  Refill  . clonazePAM (KLONOPIN) 1 MG tablet   Oral   Take 1 tablet (1 mg total) by mouth 3 (three) times daily as needed for anxiety.   90 tablet   1   . cyclobenzaprine (FLEXERIL) 10 MG tablet   Oral   Take 1 tablet (10 mg total) by mouth 3 (three) times daily as needed for muscle spasms. For back pain   90 tablet   1   . ibuprofen (ADVIL,MOTRIN) 800 MG tablet   Oral   Take 1 tablet (800 mg total) by mouth every 8 (eight) hours as needed for moderate pain.   90 tablet   4   . lisinopril-hydrochlorothiazide (PRINZIDE,ZESTORETIC) 20-25 MG per tablet   Oral   Take 1 tablet by mouth daily.   90 tablet   11   . metoprolol tartrate (LOPRESSOR) 25 MG tablet   Oral   Take 1 tablet (25 mg total) by mouth 2 (two) times daily.   180 tablet   1   . ondansetron (ZOFRAN) 4 MG tablet   Oral   Take 1 tablet (4 mg total) by mouth every 6 (six) hours.   12 tablet   0   . oxyCODONE-acetaminophen (ROXICET) 5-325 MG per tablet   Oral   Take 1 tablet by mouth every 8 (eight) hours as needed. Do not fill before 03/19/13  90 tablet   0   . pantoprazole (PROTONIX) 40 MG tablet   Oral   Take 40 mg by mouth daily.         Marland Kitchen zolpidem (AMBIEN) 10 MG tablet   Oral   Take 1 tablet (10 mg total) by mouth at bedtime as needed for sleep.   30 tablet   3   . medroxyPROGESTERone (DEPO-PROVERA) 150 MG/ML injection   Intramuscular   Inject 1 mL (150 mg total) into the muscle every 3 (three) months.   1 mL   0   . nitroGLYCERIN (NITROSTAT) 0.4 MG SL tablet   Sublingual   Place 1 tablet (0.4 mg total) under the tongue every 5 (five) minutes as needed for chest pain.   15 tablet   0   . ondansetron (ZOFRAN) 4 MG tablet   Oral   Take 1 tablet (4 mg total) by mouth every 6 (six) hours.   12 tablet   0   .  promethazine (PHENERGAN) 25 MG suppository   Rectal   Place 1 suppository (25 mg total) rectally every 6 (six) hours as needed for nausea or vomiting.   12 each   0    BP 146/100  Pulse 72  Temp(Src) 97.8 F (36.6 C) (Oral)  Resp 30  SpO2 100% Physical Exam  Nursing note and vitals reviewed. Constitutional: She is oriented to person, place, and time. She appears well-developed and well-nourished.  HENT:  Head: Normocephalic and atraumatic.  Eyes: No scleral icterus.  Neck: Normal range of motion. Neck supple.  Cardiovascular: Normal rate and regular rhythm.   No murmur heard. Pulmonary/Chest: Effort normal and breath sounds normal. No respiratory distress. She has no wheezes. She has no rales.  Abdominal: Soft. Bowel sounds are normal. She exhibits no distension. There is tenderness in the right upper quadrant, epigastric area and left upper quadrant. There is no rigidity, no rebound, no guarding, no CVA tenderness, no tenderness at McBurney's point and negative Murphy's sign.    Abdominal exam limited by patient's body habitus.    Neurological: She is alert and oriented to person, place, and time.  Skin: Skin is warm and dry.    ED Course  Procedures (including critical care time) Labs Review Labs Reviewed  CBC - Abnormal; Notable for the following:    RDW 15.8 (*)    All other components within normal limits  COMPREHENSIVE METABOLIC PANEL - Abnormal; Notable for the following:    Glucose, Bld 150 (*)    AST 39 (*)    GFR calc non Af Amer 72 (*)    GFR calc Af Amer 84 (*)    All other components within normal limits  LIPASE, BLOOD   Imaging Review US Abdomen Complete  04/02/2013   CLINICAL DATA:  Abdominal pain, nausea, vomiting  EXAM: ULTRASOUND ABDOMEN COMPLETE  COMPARISON:  None.  FINDINGS: Gallbladder:  No gallstones or wall thickening visualized. No sonographic Murphy sign noted.  Common bile duct:  Diameter: 3.7 mm  Liver:  No focal lesion identified. The liver  is slightly increased in echogenicity as can be seen with hepatic steatosis. .  IVC:  No abnormality visualized.  Pancreas:  Visualized portion unremarkable.  Spleen:  Size and appearance within normal limits.  Right Kidney:  Length: 10.9 cm. Echogenicity within normal limits. No mass or hydronephrosis visualized.  Left Kidney:  Length: 11.4 cm. Echogenicity within normal limits. No mass or hydronephrosis visualized.  Abdominal aorta:  No aneurysm visualized.  Other findings:  None.  IMPRESSION: 1. No cholelithiasis or sonographic evidence of acute cholecystitis. 2. Hepatic steatosis.   Electronically Signed   By: Kathreen Devoid   On: 04/02/2013 14:19    EKG Interpretation   None       MDM   1. Gastritis   2. Hepatic steatosis    Pt with epigastric discomfort, multiple episodes of vomiting and diarrhea.  Likely viral gastroenteritis. Will try and control nausea, pain, and rehydrate. Labs within normal limits.   1310Re-eval patient reports minimal improvement in pain with morphine.  Abdomen still tender to palpation.  Will RU cholelithiasis with [place]. US-1. No cholelithiasis or sonographic evidence of acute cholecystitis. 2. Hepatic steatosis.  1500:Re-eval patient reports pain has improved c/o nausea. Pt is requesting oral intake in the ED. Discussed lab results, imaging results (including hepatic steatosis), and treatment plan with the patient. Return precautions given. Reports understanding and no other concerns at this time.  Patient is stable for discharge at this time.  Meds given in ED:  Medications  ondansetron (ZOFRAN) injection 4 mg (4 mg Intravenous Not Given 04/02/13 1221)  sodium chloride 0.9 % bolus 1,000 mL (0 mLs Intravenous Stopped 04/02/13 1451)  morphine 4 MG/ML injection 4 mg (4 mg Intravenous Given 04/02/13 1215)  promethazine (PHENERGAN) injection 25 mg (25 mg Intravenous Given 04/02/13 1219)  HYDROmorphone (DILAUDID) injection 1 mg (1 mg Intravenous Given 04/02/13 1312)    promethazine (PHENERGAN) injection 25 mg (25 mg Intravenous Given 04/02/13 1531)  gi cocktail (Maalox,Lidocaine,Donnatal) (30 mLs Oral Given 04/02/13 1535)    New Prescriptions   ONDANSETRON (ZOFRAN) 4 MG TABLET    Take 1 tablet (4 mg total) by mouth every 6 (six) hours.   PROMETHAZINE (PHENERGAN) 25 MG SUPPOSITORY    Place 1 suppository (25 mg total) rectally every 6 (six) hours as needed for nausea or vomiting.         Lorrine Kin, PA-C 04/04/13 1400

## 2013-04-05 NOTE — ED Provider Notes (Signed)
Medical screening examination/treatment/procedure(s) were performed by non-physician practitioner and as supervising physician I was immediately available for consultation/collaboration.  EKG Interpretation   None        Virgel Manifold, MD 04/05/13 1505

## 2013-04-08 ENCOUNTER — Telehealth: Payer: Self-pay | Admitting: Family Medicine

## 2013-04-08 ENCOUNTER — Ambulatory Visit (INDEPENDENT_AMBULATORY_CARE_PROVIDER_SITE_OTHER): Payer: Medicare Other | Admitting: Family Medicine

## 2013-04-08 ENCOUNTER — Encounter: Payer: Self-pay | Admitting: Family Medicine

## 2013-04-08 VITALS — BP 130/88 | HR 72 | Temp 98.1°F | Ht 68.5 in | Wt 234.0 lb

## 2013-04-08 DIAGNOSIS — M1710 Unilateral primary osteoarthritis, unspecified knee: Secondary | ICD-10-CM | POA: Diagnosis not present

## 2013-04-08 DIAGNOSIS — R197 Diarrhea, unspecified: Secondary | ICD-10-CM

## 2013-04-08 DIAGNOSIS — M1711 Unilateral primary osteoarthritis, right knee: Secondary | ICD-10-CM

## 2013-04-08 DIAGNOSIS — IMO0002 Reserved for concepts with insufficient information to code with codable children: Secondary | ICD-10-CM | POA: Diagnosis not present

## 2013-04-08 DIAGNOSIS — D259 Leiomyoma of uterus, unspecified: Secondary | ICD-10-CM | POA: Diagnosis not present

## 2013-04-08 DIAGNOSIS — I10 Essential (primary) hypertension: Secondary | ICD-10-CM | POA: Diagnosis not present

## 2013-04-08 DIAGNOSIS — R1013 Epigastric pain: Secondary | ICD-10-CM

## 2013-04-08 DIAGNOSIS — M170 Bilateral primary osteoarthritis of knee: Secondary | ICD-10-CM

## 2013-04-08 DIAGNOSIS — A048 Other specified bacterial intestinal infections: Secondary | ICD-10-CM

## 2013-04-08 LAB — POCT H PYLORI SCREEN: H Pylori Screen, POC: POSITIVE

## 2013-04-08 MED ORDER — LISINOPRIL-HYDROCHLOROTHIAZIDE 20-25 MG PO TABS: 1.0000 | ORAL_TABLET | Freq: Every day | ORAL | Status: DC

## 2013-04-08 MED ORDER — OXYCODONE-ACETAMINOPHEN 5-325 MG PO TABS: 1.0000 | ORAL_TABLET | Freq: Three times a day (TID) | ORAL | Status: DC | PRN

## 2013-04-08 MED ORDER — METOPROLOL TARTRATE 25 MG PO TABS: 25.0000 mg | ORAL_TABLET | Freq: Two times a day (BID) | ORAL | Status: DC

## 2013-04-08 MED ORDER — KETOROLAC TROMETHAMINE 30 MG/ML IJ SOLN
30.0000 mg | Freq: Once | INTRAMUSCULAR | Status: AC
  Administered 2013-04-08: 30 mg via INTRAMUSCULAR

## 2013-04-08 MED ORDER — AMOXICILLIN 500 MG PO TABS: 1000.0000 mg | ORAL_TABLET | Freq: Two times a day (BID) | ORAL | Status: DC

## 2013-04-08 MED ORDER — CYCLOBENZAPRINE HCL 10 MG PO TABS: 10.0000 mg | ORAL_TABLET | Freq: Three times a day (TID) | ORAL | Status: DC | PRN

## 2013-04-08 MED ORDER — IBUPROFEN 800 MG PO TABS: 800.0000 mg | ORAL_TABLET | Freq: Three times a day (TID) | ORAL | Status: DC | PRN

## 2013-04-08 MED ORDER — MEDROXYPROGESTERONE ACETATE 150 MG/ML IM SUSP
150.0000 mg | Freq: Once | INTRAMUSCULAR | Status: AC
  Administered 2013-04-08: 150 mg via INTRAMUSCULAR

## 2013-04-08 MED ORDER — CLARITHROMYCIN 500 MG PO TABS: 500.0000 mg | ORAL_TABLET | Freq: Two times a day (BID) | ORAL | Status: DC

## 2013-04-08 MED ORDER — ZOLPIDEM TARTRATE 10 MG PO TABS: 10.0000 mg | ORAL_TABLET | Freq: Every evening | ORAL | Status: DC | PRN

## 2013-04-08 NOTE — Patient Instructions (Signed)
For the next week, increase your protonix to 40mg  twice a day.   We are going to test for a bacteria in your stomach called H.Pylori. If it is positive we will treat with antibiotics.  We are also going to test for a bacteria in your gut called c-diff: - collect sample at home and bring it back to the clinic - I will get the result back. If it is negative, I will call you so that you can start taking imodium. Avoid taking imodium until we test for it.  - continue drinking plenty of fluids

## 2013-04-08 NOTE — Telephone Encounter (Signed)
Called patient to let her know of the positive H-pylori. Will treat with clarithromycin and amoxicillin. Continue protonix.   Liam Graham, PGY-3 Family Medicine Resident

## 2013-04-10 DIAGNOSIS — R197 Diarrhea, unspecified: Secondary | ICD-10-CM | POA: Insufficient documentation

## 2013-04-10 DIAGNOSIS — R1013 Epigastric pain: Secondary | ICD-10-CM | POA: Insufficient documentation

## 2013-04-10 NOTE — Assessment & Plan Note (Signed)
Likely viral gastroenteritis but given persistent symptoms for a week, cannot rule out c-diff.  - patient to collect sample at home - if negative, can start immodium for control of symptoms - appears well hydrated on exam and she is keeping fluids down which is reassuring.  - red flags for return reviewed (worsening diarrhea, not drinking, not urinating like normal, worsening fevers...)

## 2013-04-10 NOTE — Assessment & Plan Note (Signed)
toradol shot in office Refilled percocet Referral for sports medicine for evaluation for viscose supplementation.

## 2013-04-10 NOTE — Progress Notes (Signed)
Patient ID: [name]    DOB: July 19, 1960, 53 y.o.   MRN: [id number] --- Subjective:  Shannon Stuart is a 53 y.o.female who presents with abdominal pain and diarrhea.  - was seen in the ED on 04/02/13 for abdominal pain and vomiting. She was given fluids, zofran and was discharged. Since then, she states that she continues to have some nausea. Has been having an episode of non bloody emesis once a day. She also has watery non bloody diarrhea multiple times per day. She continues to drink fluids. She has also kept down broth, yogurt and apple sauce. She denies any fevers but has been feeling chills. She felt a little dizzy this morning. Denies any cough, congestion or sore throat. Denies any recent antibiotic use or hospitalization.  She has not taken anything for this. She has not taken the phenergan thinking that she doesn't need it enough to take it. She has been able to take her regular medications which includes protonix.   - knee pain: bilateral, same as before, has noticed some swelling in both knees. Continues to walk around campus to classes. Pain worst with walking and going up stairs.   ROS: see HPI Past Medical History: reviewed and updated medications and allergies. Social History: Tobacco: none  Objective: Filed Vitals:   04/08/13 1540  BP: 130/88  Pulse: 72  Temp: 98.1 F (36.7 C)    Physical Examination:   General appearance - alert, well appearing, and in no distress Chest - clear to auscultation, no wheezes, rales or rhonchi, symmetric air entry Heart - normal rate, regular rhythm, normal S1, S2, no murmurs Abdomen - soft, tender in epigastric region, no rebound, no guarding Extremities - no pedal edema Knees: mild joint effusion in anterior knees bilaterally, no warmth or erythema to touch, decreased extension and flexion due to pain

## 2013-04-10 NOTE — Assessment & Plan Note (Signed)
Could be from viral gastroenteritis given presence of associated diarrhea. However, Tested for H-pylori in context of epigastric pain, which was positive.  - treat H-pylori with clarithromycin, amoxicillin and protonix.  - return if worst - phenergan for nausea

## 2013-04-13 NOTE — Addendum Note (Signed)
Addended by: Lianne Bushy on: 04/13/2013 08:56 AM   Modules accepted: Orders

## 2013-04-14 ENCOUNTER — Telehealth: Payer: Self-pay | Admitting: Family Medicine

## 2013-04-14 LAB — CLOSTRIDIUM DIFFICILE EIA: CDIFTX: NEGATIVE

## 2013-04-14 NOTE — Telephone Encounter (Signed)
Left message for patient to return call. Please read message from Dr Otis Dials.Shannon Stuart, Shannon Stuart

## 2013-04-14 NOTE — Telephone Encounter (Signed)
Pt notified.  Muna Demers L, CMA  

## 2013-04-14 NOTE — Telephone Encounter (Signed)
Please let patient know that her C-diff was negative and that she can take over the counter immodium if she continues to have diarrhea.  Thank you!  Liam Graham, PGY-3 Family Medicine Resident

## 2013-05-04 ENCOUNTER — Telehealth: Payer: Self-pay | Admitting: Family Medicine

## 2013-05-04 ENCOUNTER — Ambulatory Visit (INDEPENDENT_AMBULATORY_CARE_PROVIDER_SITE_OTHER): Payer: Medicare Other | Admitting: Sports Medicine

## 2013-05-04 ENCOUNTER — Encounter: Payer: Self-pay | Admitting: Sports Medicine

## 2013-05-04 VITALS — BP 152/100 | Ht 68.0 in | Wt 228.0 lb

## 2013-05-04 DIAGNOSIS — M1710 Unilateral primary osteoarthritis, unspecified knee: Secondary | ICD-10-CM

## 2013-05-04 DIAGNOSIS — M170 Bilateral primary osteoarthritis of knee: Secondary | ICD-10-CM

## 2013-05-04 DIAGNOSIS — M1711 Unilateral primary osteoarthritis, right knee: Secondary | ICD-10-CM

## 2013-05-04 DIAGNOSIS — IMO0002 Reserved for concepts with insufficient information to code with codable children: Secondary | ICD-10-CM | POA: Diagnosis not present

## 2013-05-04 MED ORDER — METHYLPREDNISOLONE ACETATE 80 MG/ML IJ SUSP
80.0000 mg | Freq: Once | INTRAMUSCULAR | Status: AC
  Administered 2013-05-04: 80 mg via INTRA_ARTICULAR

## 2013-05-04 NOTE — Progress Notes (Signed)
CC: Followup bilateral knee pain, right greater than left HPI: Patient returns for followup of her bilateral knee pain. She is known history of Oster arthritis of the knees. She states that her bilateral knees are painful and swell frequently. However her right knee is significantly worse. She notes that it buckles and this is very painful and causes her to fall. She is trying ice and heat. She is also using Percocet 3-4 times a day. She previously was given a brace by Dr. Oneida Alar which she says was very helpful but she wore this so much that it actually wore out. She recently saw an orthopedic surgeon about a year ago who recommended knee replacement based on her end-stage patellofemoral compartment DJD in the right knee. However she is very hesitant to go forward with surgery. She states that the last corticosteroid injection only lasted about a week for her. She is also on ibuprofen 800 mg 3 times a day.  ROS: As above in the HPI. All other systems are stable or negative.  OBJECTIVE: APPEARANCE:  Patient in no acute distress.The patient appeared well nourished and normally developed. HEENT: No scleral icterus. Conjunctiva non-injected Resp: Non labored Skin: No rash MSK:  Right Knee - Inspection normal with no erythema or effusion or obvious bony abnormalities.  - Severe tenderness to palpation over the medial and lateral joint line - Decreased ROM normal in flexion and extension with significant guarding due to pain. - Strength 5/5 in flexion and extension. - Painful patellar compression.  - Neurovascularly intact  MSK [place]: Not performed   ASSESSMENT: #1. Bilateral knee osteoarthritis, right more symptomatic than left   PLAN: Discussed options for treatment with the patient. Explain that based on her previous physician's recommendation for knee replacement as well as the fact that she is virtually exhausted conservative care at this point we do not have a lot of additional options to  offer her. She is currently taking maximum dose NSAID therapy as well as chronic narcotic therapy. She only had a short lived temporary response to corticosteroid injection one year ago. However, she did have significant improvement with the knee brace so she was given a new DonJoy knee brace today. She also elected to go forward with a corticosteroid injection. I did explain to her that if this is only temporarily helpful that she may need to return to a surgeon, possibly for a second opinion, but that we would not have a lot of other options to offer her.  Consent obtained and verified.  Time-out conducted.  Noted no overlying erythema, induration, or other signs of local infection.  Skin prepped in a sterile fashion.  Topical analgesic spray: Ethyl chloride.  Joint: Right knee Needle: 25-gauge 1-1/2 inch Completed without difficulty.  Meds: 6 cc 1% lidocaine, 1 cc of 80 mg depomedrol Advised to call if fevers/chills, erythema, induration, drainage, or persistent bleeding.

## 2013-05-04 NOTE — Telephone Encounter (Signed)
Will fwd. To Dr.Losq (pt had appt with Dr.Draper today) .[place]

## 2013-05-04 NOTE — Patient Instructions (Signed)
Thank you for coming in today  We will inject your right knee today Try braces Continue ice  Followup as needed  If this does not help, surgery may be the best option

## 2013-05-04 NOTE — Telephone Encounter (Signed)
Pt came in stating that she needs prescription for pain medicine.

## 2013-05-05 MED ORDER — OXYCODONE-ACETAMINOPHEN 5-325 MG PO TABS: 1.0000 | ORAL_TABLET | Freq: Three times a day (TID) | ORAL | Status: DC | PRN

## 2013-05-05 NOTE — Telephone Encounter (Signed)
LMVM that Rx ready to pick up at front desk.  Nawal Burling, Loralyn Freshwater, [place]

## 2013-05-05 NOTE — Telephone Encounter (Signed)
Please let patient know that Rx is ready at the front desk.  Thank you!  Liam Graham, PGY-3 Family Medicine Resident

## 2013-05-07 ENCOUNTER — Other Ambulatory Visit: Payer: Self-pay | Admitting: *Deleted

## 2013-05-07 ENCOUNTER — Telehealth: Payer: Self-pay | Admitting: Family Medicine

## 2013-05-07 MED ORDER — IBUPROFEN 800 MG PO TABS: 800.0000 mg | ORAL_TABLET | Freq: Three times a day (TID) | ORAL | Status: DC | PRN

## 2013-05-18 ENCOUNTER — Ambulatory Visit: Payer: Medicare Other | Admitting: Family Medicine

## 2013-06-02 ENCOUNTER — Other Ambulatory Visit: Payer: Self-pay | Admitting: Family Medicine

## 2013-06-02 ENCOUNTER — Encounter: Payer: Self-pay | Admitting: Family Medicine

## 2013-06-02 ENCOUNTER — Ambulatory Visit (INDEPENDENT_AMBULATORY_CARE_PROVIDER_SITE_OTHER): Payer: Medicare Other | Admitting: Family Medicine

## 2013-06-02 VITALS — BP 138/85 | HR 90 | Temp 98.4°F | Ht 68.0 in | Wt 235.0 lb

## 2013-06-02 DIAGNOSIS — Z1211 Encounter for screening for malignant neoplasm of colon: Secondary | ICD-10-CM | POA: Diagnosis not present

## 2013-06-02 DIAGNOSIS — M545 Low back pain, unspecified: Secondary | ICD-10-CM

## 2013-06-02 DIAGNOSIS — M1710 Unilateral primary osteoarthritis, unspecified knee: Secondary | ICD-10-CM

## 2013-06-02 DIAGNOSIS — R197 Diarrhea, unspecified: Secondary | ICD-10-CM | POA: Diagnosis not present

## 2013-06-02 DIAGNOSIS — R7309 Other abnormal glucose: Secondary | ICD-10-CM

## 2013-06-02 DIAGNOSIS — R7303 Prediabetes: Secondary | ICD-10-CM

## 2013-06-02 DIAGNOSIS — R1013 Epigastric pain: Secondary | ICD-10-CM | POA: Diagnosis not present

## 2013-06-02 DIAGNOSIS — IMO0002 Reserved for concepts with insufficient information to code with codable children: Secondary | ICD-10-CM

## 2013-06-02 DIAGNOSIS — R739 Hyperglycemia, unspecified: Secondary | ICD-10-CM

## 2013-06-02 DIAGNOSIS — E669 Obesity, unspecified: Secondary | ICD-10-CM

## 2013-06-02 DIAGNOSIS — M170 Bilateral primary osteoarthritis of knee: Secondary | ICD-10-CM

## 2013-06-02 DIAGNOSIS — I10 Essential (primary) hypertension: Secondary | ICD-10-CM | POA: Diagnosis not present

## 2013-06-02 LAB — POCT GLYCOSYLATED HEMOGLOBIN (HGB A1C): Hemoglobin A1C: 6.1

## 2013-06-02 MED ORDER — OXYCODONE-ACETAMINOPHEN 5-325 MG PO TABS: 1.0000 | ORAL_TABLET | Freq: Three times a day (TID) | ORAL | Status: DC | PRN

## 2013-06-02 MED ORDER — METHYLPREDNISOLONE ACETATE 40 MG/ML IJ SUSP
40.0000 mg | Freq: Once | INTRAMUSCULAR | Status: AC
  Administered 2013-06-02: 40 mg via INTRAMUSCULAR

## 2013-06-02 NOTE — Patient Instructions (Signed)
Let's check your sugars and see how you're doing.   I am sending a referral for gastroenterology.

## 2013-06-02 NOTE — Assessment & Plan Note (Signed)
A1C today of 6.1.  Continue balanced diet and exercise

## 2013-06-02 NOTE — Assessment & Plan Note (Signed)
Follow up with sports med Refilled percocet

## 2013-06-02 NOTE — Assessment & Plan Note (Signed)
40mg  of depomedrol injection today.

## 2013-06-02 NOTE — Assessment & Plan Note (Signed)
Likely post infectious colitis. Resolving. Patient hydrating well.  - referral to GI for screening colonoscopy.

## 2013-06-02 NOTE — Progress Notes (Signed)
Patient ID: Shannon Stuart    DOB: 1960-09-20, 53 y.o.   MRN: [id number] --- Subjective:  Shannon Stuart is a 53 y.o.female who presents for medicine refill and diarrhea.  - diarrhea: originally started 1 month ago when she had a viral gastroenteritis. She then continued having diarrhea about 4 times a week, watery, non bloody. Associated with mild cramping when she has bowel movement. Has been having some nausea but no vomiting. Had a fever of 101 last Wednesday with feeling of chills which has since resolved.  She continues to take prilosec for GERD symptoms.   - knee pain: bilateral. Got steroid injection in February which helped. She also has been wearing knee braces bilaterally whch has really helped. She is able to go to school. She continues to exercise regularly. She continues to have low back pain as well.   ROS: see HPI Past Medical History: reviewed and updated medications and allergies. Social History: Tobacco: none  Objective: Filed Vitals:   06/02/13 1358  BP: 138/85  Pulse: 90  Temp: 98.4 F (36.9 C)    Physical Examination:   General appearance - alert, well appearing, and in no distress Mouth - mucous membranes moist, pharynx normal without lesions Chest - clear to auscultation, no wheezes, rales or rhonchi, symmetric air entry Heart - normal rate, regular rhythm, normal S1, S2, no murmurs Abdomen - soft, tender in epigastric region and left lower quadrant, nondistended, no masses or organomegaly  Extremities - no pedal edema Knees - braces in place bilaterally, stiff with walking.

## 2013-06-04 ENCOUNTER — Encounter: Payer: Self-pay | Admitting: Gastroenterology

## 2013-06-17 ENCOUNTER — Other Ambulatory Visit: Payer: Self-pay | Admitting: *Deleted

## 2013-06-17 MED ORDER — PANTOPRAZOLE SODIUM 40 MG PO TBEC: 40.0000 mg | DELAYED_RELEASE_TABLET | Freq: Every day | ORAL | Status: DC

## 2013-07-02 ENCOUNTER — Ambulatory Visit (INDEPENDENT_AMBULATORY_CARE_PROVIDER_SITE_OTHER): Payer: Medicare Other | Admitting: *Deleted

## 2013-07-02 DIAGNOSIS — N938 Other specified abnormal uterine and vaginal bleeding: Secondary | ICD-10-CM | POA: Diagnosis not present

## 2013-07-02 DIAGNOSIS — N925 Other specified irregular menstruation: Secondary | ICD-10-CM | POA: Diagnosis not present

## 2013-07-02 DIAGNOSIS — N949 Unspecified condition associated with female genital organs and menstrual cycle: Secondary | ICD-10-CM

## 2013-07-02 MED ORDER — MEDROXYPROGESTERONE ACETATE 150 MG/ML IM SUSP
150.0000 mg | Freq: Once | INTRAMUSCULAR | Status: AC
  Administered 2013-07-02: 150 mg via INTRAMUSCULAR

## 2013-07-02 NOTE — Progress Notes (Signed)
   Pt in for Depo Provera injection.  Pt tolerated Depo injection. Depo given Left upper outer quadrant.  Next injection due June 26 - October 01, 2013.  Reminder card given. Jaion Lagrange L, RN   

## 2013-07-08 ENCOUNTER — Ambulatory Visit: Payer: Medicare Other | Admitting: Family Medicine

## 2013-07-20 ENCOUNTER — Ambulatory Visit (INDEPENDENT_AMBULATORY_CARE_PROVIDER_SITE_OTHER): Payer: Medicare Other | Admitting: Gastroenterology

## 2013-07-20 ENCOUNTER — Encounter: Payer: Self-pay | Admitting: Gastroenterology

## 2013-07-20 VITALS — BP 150/106 | HR 64 | Ht 67.5 in | Wt 234.1 lb

## 2013-07-20 DIAGNOSIS — R112 Nausea with vomiting, unspecified: Secondary | ICD-10-CM

## 2013-07-20 DIAGNOSIS — G8929 Other chronic pain: Secondary | ICD-10-CM

## 2013-07-20 DIAGNOSIS — R1013 Epigastric pain: Secondary | ICD-10-CM

## 2013-07-20 DIAGNOSIS — R197 Diarrhea, unspecified: Secondary | ICD-10-CM | POA: Diagnosis not present

## 2013-07-20 MED ORDER — MOVIPREP 100 G PO SOLR: 1.0000 | Freq: Once | ORAL | Status: DC

## 2013-07-20 NOTE — Progress Notes (Signed)
HPI: This is a   pleasant 53 year old woman whom I am meeting for the first time today.  She has episodes of nausea, vomiting and diarrhea. She also has intermittent epigastric pains. These are associated with nausea and vomiting.  Has had abdominal pain, constant for 2 years.  Getting worse.  Certain body positions can help.  No real foods cause problems.    Has had diarrhea since back surgery in 1988; only has problem "when she is sick."  Vomiting twice a month.  She takes percocet 2-3 per day for back, knee, hip pains.  Was tested for H. Pylori 3-4 months ago.   [place] 03/2013 was normal except for fatty liver. Labs 05/2013: cbc was normal.  H. Pylori serology +, treated with amox/clav per pcp, lfts normal except slightly elevated AST at 39    Review of systems: Pertinent positive and negative review of systems were noted in the above HPI section. Complete review of systems was performed and was otherwise normal.    Past Medical History  Diagnosis Date  . Allergy   . Anxiety   . Depression   . Hyperlipidemia     pt denies 07/20/13  . Hypertension   . Endometriosis   . GERD (gastroesophageal reflux disease)   . Obesity, Class II, BMI 35-39.9     Past Surgical History  Procedure Laterality Date  . Brain tumor excision    . Lumbar disc surgery  04/1986, 12/1986    x 2  . Tissue graft      Current Outpatient Prescriptions  Medication Sig Dispense Refill  . clonazePAM (KLONOPIN) 1 MG tablet Take 1 tablet (1 mg total) by mouth 3 (three) times daily as needed for anxiety.  90 tablet  1  . cyclobenzaprine (FLEXERIL) 10 MG tablet TAKE 1 TABLET THREE TIMES A DAY AS NEEDED FOR MUSCLE SPASMS & BACK PAIN  90 tablet  1  . ibuprofen (ADVIL,MOTRIN) 800 MG tablet Take 1 tablet (800 mg total) by mouth every 8 (eight) hours as needed for moderate pain.  90 tablet  4  . lisinopril-hydrochlorothiazide (PRINZIDE,ZESTORETIC) 20-25 MG per tablet Take 1 tablet by mouth daily.  90 tablet  11   . medroxyPROGESTERone (DEPO-PROVERA) 150 MG/ML injection Inject 1 mL (150 mg total) into the muscle every 3 (three) months.  1 mL  0  . metoprolol tartrate (LOPRESSOR) 25 MG tablet Take 1 tablet (25 mg total) by mouth 2 (two) times daily.  180 tablet  4  . oxyCODONE-acetaminophen (ROXICET) 5-325 MG per tablet Take 1 tablet by mouth every 8 (eight) hours as needed. Do not fill prior to 07/03/13  90 tablet  0  . pantoprazole (PROTONIX) 40 MG tablet Take 1 tablet (40 mg total) by mouth daily.  30 tablet  3  . promethazine (PHENERGAN) 25 MG suppository Place 1 suppository (25 mg total) rectally every 6 (six) hours as needed for nausea or vomiting.  12 each  0  . zolpidem (AMBIEN) 10 MG tablet Take 1 tablet (10 mg total) by mouth at bedtime as needed for sleep.  30 tablet  3  . nitroGLYCERIN (NITROSTAT) 0.4 MG SL tablet Place 1 tablet (0.4 mg total) under the tongue every 5 (five) minutes as needed for chest pain.  15 tablet  0   No current facility-administered medications for this visit.    Allergies as of 07/20/2013 - Review Complete 07/20/2013  Allergen Reaction Noted  . Zofran [ondansetron hcl] Nausea And Vomiting 04/08/2013    Family  History  Problem Relation Age of Onset  . Diabetes Maternal Grandmother   . Diabetes Sister   . Diabetes Sister     gestational  . Heart disease Mother 82    MI  . Heart failure Maternal Grandmother     CHF  . Hypertension Other     entire family    History   Social History  . Marital Status: Single    Spouse Name: N/A    Number of Children: 1  . Years of Education: N/A   Occupational History  . retired    Social History Main Topics  . Smoking status: Former Smoker    Types: Cigarettes    Quit date: 03/26/1991  . Smokeless tobacco: Never Used  . Alcohol Use: Yes     Comment: 'SOCIALLY"  . Drug Use: Yes    Special: Marijuana     Comment: "social smoking"  . Sexual Activity: Not Currently    Partners: Female    Birth Control/  Protection: Injection   Other Topics Concern  . Not on file   Social History Narrative  . No narrative on file       Physical Exam: Ht 5' 7.5" (1.715 m)  Wt 234 lb 2 oz (106.198 kg)  BMI 36.11 kg/m2 Constitutional: generally well-appearing Psychiatric: alert and oriented x3 Eyes: extraocular movements intact Mouth: oral pharynx moist, no lesions Neck: supple no lymphadenopathy Cardiovascular: heart regular rate and rhythm Lungs: clear to auscultation bilaterally Abdomen: soft, mildly tender in epigastrium, nondistended, no obvious ascites, no peritoneal signs, normal bowel sounds Extremities: no lower extremity edema bilaterally Skin: no lesions on visible extremities    Assessment and plan: 53 y.o. female with  intermittent epigastric pains, intermittent vomiting, intermittent diarrhea  Is a constellation of GI symptoms. She has never had colon cancer screening with colonoscopy and we'll proceed with that at her soonest convenience. She is somewhat tender in her epigastrium, reports intermittent nausea.like to proceed with EGD as well the same time as her colonoscopy. She tells me she has been told her gallbladder is very sick and I see no evidence of that on any imaging studies previously her lab tests her the intermittent nature of her symptoms could be biliary. I will arrange for HIDA scan to check for biliary dyskinesia.

## 2013-07-20 NOTE — Patient Instructions (Addendum)
You will be set up for a colonoscopy for colon cancer screening, intermittent diarrhea. You will be set up for an upper endoscopy for epigastric pain. You will be set up for a HIDA scan (radiology test) for your intermittent pains, ? Biliary dyskenesia.  You have been scheduled for a HIDA scan at [facility] (1st floor) on 07/26/13. Please arrive 15 minutes prior to your scheduled appointment at  1 pm. Make certain not to have anything to eat or drink at least 6 hours prior to your test. Should this appointment date or time not work well for you, please call radiology scheduling at [phone].  _____________________________________________________________________ hepatobiliary (HIDA) scan is an imaging procedure used to diagnose problems in the liver, gallbladder and bile ducts. In the HIDA scan, a radioactive chemical or tracer is injected into a vein in your arm. The tracer is handled by the liver like bile. Bile is a fluid produced and excreted by your liver that helps your digestive system break down fats in the foods you eat. Bile is stored in your gallbladder and the gallbladder releases the bile when you eat a meal. A special nuclear medicine scanner (gamma camera) tracks the flow of the tracer from your liver into your gallbladder and small intestine.  During your HIDA scan  You'll be asked to change into a hospital gown before your HIDA scan begins. Your health care team will position you on a table, usually on your back. The radioactive tracer is then injected into a vein in your arm.The tracer travels through your bloodstream to your liver, where it's taken up by the bile-producing cells. The radioactive tracer travels with the bile from your liver into your gallbladder and through your bile ducts to your small intestine.You may feel some pressure while the radioactive tracer is injected into your vein. As you lie on the table, a special gamma camera is positioned over your abdomen taking  pictures of the tracer as it moves through your body. The gamma camera takes pictures continually for about an hour. You'll need to keep still during the HIDA scan. This can become uncomfortable, but you may find that you can lessen the discomfort by taking deep breaths and thinking about other things. Tell your health care team if you're uncomfortable. The radiologist will watch on a computer the progress of the radioactive tracer through your body. The HIDA scan may be stopped when the radioactive tracer is seen in the gallbladder and enters your small intestine. This typically takes about an hour. In some cases extra imaging will be performed if original images aren't satisfactory, if morphine is given to help visualize the gallbladder or if the medication CCK is given to look at the contraction of the gallbladder. This test typically takes 2 hours to complete. ________________________________________________________________________

## 2013-07-26 ENCOUNTER — Ambulatory Visit (HOSPITAL_COMMUNITY): Payer: Medicare Other

## 2013-08-09 ENCOUNTER — Ambulatory Visit (INDEPENDENT_AMBULATORY_CARE_PROVIDER_SITE_OTHER): Payer: Medicare Other | Admitting: Family Medicine

## 2013-08-09 ENCOUNTER — Encounter: Payer: Self-pay | Admitting: Family Medicine

## 2013-08-09 VITALS — BP 129/85 | HR 70 | Temp 97.8°F | Ht 68.0 in | Wt 224.6 lb

## 2013-08-09 DIAGNOSIS — M170 Bilateral primary osteoarthritis of knee: Secondary | ICD-10-CM

## 2013-08-09 DIAGNOSIS — M545 Low back pain, unspecified: Secondary | ICD-10-CM

## 2013-08-09 DIAGNOSIS — M1710 Unilateral primary osteoarthritis, unspecified knee: Secondary | ICD-10-CM

## 2013-08-09 DIAGNOSIS — IMO0002 Reserved for concepts with insufficient information to code with codable children: Secondary | ICD-10-CM

## 2013-08-09 DIAGNOSIS — E785 Hyperlipidemia, unspecified: Secondary | ICD-10-CM

## 2013-08-09 DIAGNOSIS — E669 Obesity, unspecified: Secondary | ICD-10-CM | POA: Diagnosis not present

## 2013-08-09 DIAGNOSIS — R7309 Other abnormal glucose: Secondary | ICD-10-CM

## 2013-08-09 DIAGNOSIS — M25559 Pain in unspecified hip: Secondary | ICD-10-CM

## 2013-08-09 DIAGNOSIS — R739 Hyperglycemia, unspecified: Secondary | ICD-10-CM

## 2013-08-09 DIAGNOSIS — D369 Benign neoplasm, unspecified site: Secondary | ICD-10-CM

## 2013-08-09 LAB — LIPID PANEL
Cholesterol: 187 mg/dL (ref 0–200)
HDL: 32 mg/dL — ABNORMAL LOW (ref >39)
LDL Cholesterol: 126 mg/dL — ABNORMAL HIGH (ref 0–99)
Total CHOL/HDL Ratio: 5.8 Ratio
Triglycerides: 143 mg/dL (ref <150)
VLDL: 29 mg/dL (ref 0–40)

## 2013-08-09 LAB — HEPATIC FUNCTION PANEL
ALT: 14 U/L (ref 0–35)
AST: 15 U/L (ref 0–37)
Albumin: 4.4 g/dL (ref 3.5–5.2)
Alkaline Phosphatase: 59 U/L (ref 39–117)
Bilirubin, Direct: 0.1 mg/dL (ref 0.0–0.3)
Indirect Bilirubin: 0.4 mg/dL (ref 0.2–1.2)
Total Bilirubin: 0.5 mg/dL (ref 0.2–1.2)
Total Protein: 6.9 g/dL (ref 6.0–8.3)

## 2013-08-09 LAB — POCT GLYCOSYLATED HEMOGLOBIN (HGB A1C): Hemoglobin A1C: 6

## 2013-08-09 MED ORDER — ZOLPIDEM TARTRATE 10 MG PO TABS: 10.0000 mg | ORAL_TABLET | Freq: Every evening | ORAL | Status: DC | PRN

## 2013-08-09 MED ORDER — ZOLPIDEM TARTRATE 5 MG PO TABS: 5.0000 mg | ORAL_TABLET | Freq: Every evening | ORAL | Status: DC | PRN

## 2013-08-09 MED ORDER — OXYCODONE-ACETAMINOPHEN 5-325 MG PO TABS: 1.0000 | ORAL_TABLET | Freq: Three times a day (TID) | ORAL | Status: DC | PRN

## 2013-08-09 MED ORDER — KETOROLAC TROMETHAMINE 60 MG/2ML IM SOLN
60.0000 mg | Freq: Once | INTRAMUSCULAR | Status: AC
  Administered 2013-08-09: 60 mg via INTRAMUSCULAR

## 2013-08-09 MED ORDER — CLONAZEPAM 1 MG PO TABS: 1.0000 mg | ORAL_TABLET | Freq: Three times a day (TID) | ORAL | Status: DC | PRN

## 2013-08-09 NOTE — Assessment & Plan Note (Signed)
Likely dermoid cyst.  Make appointment at procedure clinic for removal

## 2013-08-09 NOTE — Assessment & Plan Note (Signed)
toradol 60IM today

## 2013-08-09 NOTE — Progress Notes (Signed)
Patient ID: Shannon Stuart    DOB: 1960-11-03, 53 y.o.   MRN: [id number] --- Subjective:  Shannon Stuart is a 53 y.o.female with h/o bilateral knee osteoarthritis, chronic low back pain who presents for follow up on knee pain. Her concerns include the following:   - Knee pain: chronic, right worst than left, located throughout the knee. Worst with walking and bending knee. Better with pain medicine and wearing brace. She reports popping and locking with subsequent falls from loosing her balance. This has been ongoing for multiple months. Some swelling present bilaterally - hip pain: ongoing for a year. Worst with walking. She has fallen on her hips before. Pain is bilateral. Located on the lateral aspect of the thigh.  - low back pain: chronic. Worst with standing or sitting straight. Better with bending. Located in low back and mid back. Pain radiates behind the right leg down the right knee down the foot. No bowel or urine incontinence.  - persistent epigastric pain: nausea. No appetite. Sharp pain after eating solid foods. Able to take fluids. Has been ongoing for several weeks. Scheduled for EGD and colonoscopy Wednesday. Continues to have multiple episodes of loose, non bloody stools per day - lump in back: bursts occasionally soiling her clothes with white/bloody material. Pain to touch. Fluctuates in size. Present for 1 year  ROS: see HPI Past Medical History: reviewed and updated medications and allergies. Social History: Tobacco: former smoker  Objective: Filed Vitals:   08/09/13 1013  BP: 129/85  Pulse: 70  Temp: 97.8 F (36.6 C)    Physical Examination:   General appearance - alert, well appearing, and in no distress Chest - clear to auscultation, no wheezes, rales or rhonchi, symmetric air entry Heart - normal rate, regular rhythm, normal S1, S2, no murmurs Abdomen - epigastric tenderness Low back: tenderness to palpation along lspine and paralumbar muscles bilaterally Hips:  tenderness to palpation of lateral thigh bilaterally, unable to perform FABER secondary to pain Knees: diffuse tenderness along right knee, worst in medial and lateral joints. Limited flexion and extension bilaterally. Mild effusion present bilaterally Skin - non fluctuent, cystic lesion on upper back about 1-2 cm in diamter, non tender, not draining.

## 2013-08-09 NOTE — Assessment & Plan Note (Signed)
Continue brace. Refilled percocet.  toradol today for knee pain, low back and hip pain Follow up in 1 month for refills

## 2013-08-09 NOTE — Patient Instructions (Signed)
Please return for a wellness visit to get a PAP smear done with me.   Please call to get a mammogram done.   For the cyst on your back, please make an appointment with the [facility] to get it excised.   For the hip pain, we are getting xrays for this.

## 2013-08-09 NOTE — Assessment & Plan Note (Signed)
Bilateral. With h/o falls, will check bilaterally xray to rule out fracture

## 2013-08-09 NOTE — Assessment & Plan Note (Signed)
Check lipids and patient to go over results for wellness visit. Will also check A1c and LFT's.

## 2013-08-10 ENCOUNTER — Telehealth: Payer: Self-pay | Admitting: Gastroenterology

## 2013-08-10 NOTE — Telephone Encounter (Signed)
Patient asked about her instructions for her prep.  I explained that she needed to start her prep at 5pm tonight, and 5 hours before her procedure tomorrow.  I also told her to be on clear liquids all day today and tomorrow.  I told her that she can't have any fluids for 4 hours before her procedure tomorrow.   She stated that she was about to fix bacon and eggs now.   She also asked about her medications.  I told her to take her bp meds and her reflux medication tonight.   Also, that she may have her percocet tonight.   She may also take her bp meds tomorrow due to the fact that "it runs high."  She understood her instructions and gave good feedback.

## 2013-08-11 ENCOUNTER — Encounter: Payer: Self-pay | Admitting: Gastroenterology

## 2013-08-11 ENCOUNTER — Ambulatory Visit (AMBULATORY_SURGERY_CENTER): Payer: Medicare Other | Admitting: Gastroenterology

## 2013-08-11 VITALS — BP 140/94 | HR 59 | Temp 97.4°F | Resp 10 | Ht 67.0 in | Wt 234.0 lb

## 2013-08-11 DIAGNOSIS — R112 Nausea with vomiting, unspecified: Secondary | ICD-10-CM | POA: Diagnosis not present

## 2013-08-11 DIAGNOSIS — R933 Abnormal findings on diagnostic imaging of other parts of digestive tract: Secondary | ICD-10-CM

## 2013-08-11 DIAGNOSIS — K2970 Gastritis, unspecified, without bleeding: Secondary | ICD-10-CM

## 2013-08-11 DIAGNOSIS — Z1211 Encounter for screening for malignant neoplasm of colon: Secondary | ICD-10-CM | POA: Diagnosis not present

## 2013-08-11 DIAGNOSIS — E669 Obesity, unspecified: Secondary | ICD-10-CM | POA: Diagnosis not present

## 2013-08-11 DIAGNOSIS — K219 Gastro-esophageal reflux disease without esophagitis: Secondary | ICD-10-CM | POA: Diagnosis not present

## 2013-08-11 DIAGNOSIS — IMO0001 Reserved for inherently not codable concepts without codable children: Secondary | ICD-10-CM | POA: Diagnosis not present

## 2013-08-11 DIAGNOSIS — K2990 Gastroduodenitis, unspecified, without bleeding: Secondary | ICD-10-CM

## 2013-08-11 DIAGNOSIS — R1013 Epigastric pain: Secondary | ICD-10-CM | POA: Diagnosis not present

## 2013-08-11 DIAGNOSIS — R197 Diarrhea, unspecified: Secondary | ICD-10-CM | POA: Diagnosis not present

## 2013-08-11 DIAGNOSIS — F341 Dysthymic disorder: Secondary | ICD-10-CM | POA: Diagnosis not present

## 2013-08-11 DIAGNOSIS — D126 Benign neoplasm of colon, unspecified: Secondary | ICD-10-CM

## 2013-08-11 DIAGNOSIS — I10 Essential (primary) hypertension: Secondary | ICD-10-CM | POA: Diagnosis not present

## 2013-08-11 MED ORDER — SODIUM CHLORIDE 0.9 % IV SOLN: 500.0000 mL | INTRAVENOUS | Status: DC

## 2013-08-11 NOTE — Progress Notes (Signed)
Report to pacu rn, vss, bbs=clear 

## 2013-08-11 NOTE — Op Note (Signed)
[place]  [address] [place], [id number]   ENDOSCOPY PROCEDURE REPORT  PATIENT: Shannon Stuart, Shannon Stuart  MR#: [id number] BIRTHDATE: 04-16-1960 , 61  yrs. old GENDER: Female ENDOSCOPIST: Milus Banister, MD PROCEDURE DATE:  08/11/2013 PROCEDURE:  EGD w/ biopsy ASA CLASS:     Class II INDICATIONS:  nausea and vomiting, chronic. MEDICATIONS: MAC sedation, administered by CRNA and propofol (Diprivan) 100mg  IV TOPICAL ANESTHETIC: Cetacaine Spray  DESCRIPTION OF PROCEDURE: After the risks benefits and alternatives of the procedure were thoroughly explained, informed consent was obtained.  The LB [id number] endoscope was introduced through the mouth and advanced to the second portion of the duodenum. Without limitations.  The instrument was slowly withdrawn as the mucosa was fully examined.    There was mild, non-specific distal gastritis.  Biopsies were taken and sent to pathology.  The examination was otherwise normal. Retroflexed views revealed no abnormalities.     The scope was then withdrawn from the patient and the procedure completed. COMPLICATIONS: There were no complications.  ENDOSCOPIC IMPRESSION: There was mild, non-specific distal gastritis.  Biopsies were taken and sent to pathology.  The examination was otherwise normal.  RECOMMENDATIONS: Await final pathology. Please call radiology to reschedule the HIDA scan of your gallbladder function.   eSigned:  Milus Banister, MD 08/11/2013 3:51 PM   CC: Liam Graham, MD

## 2013-08-11 NOTE — Op Note (Signed)
[facility]  [address]. [place], [address]   COLONOSCOPY PROCEDURE REPORT  PATIENT: Shannon, Stuart  MR#: [id number] BIRTHDATE: 1960/09/06 , 48  yrs. old GENDER: Female ENDOSCOPIST: Milus Banister, MD REFERRED BY: Liam Graham, MD PROCEDURE DATE:  08/11/2013 PROCEDURE:   Colonoscopy with biopsy and Colonoscopy with snare polypectomy First Screening Colonoscopy - Avg.  risk and is 50 yrs.  old or older - No.  Prior Negative Screening - Now for repeat screening. N/A  History of Adenoma - Now for follow-up colonoscopy & has been > or = to 3 yrs.  N/A  Polyps Removed Today? Yes. ASA CLASS:   Class II INDICATIONS:Chronic diarrhea (for many years). MEDICATIONS: MAC sedation, administered by CRNA and propofol (Diprivan) 300mg  IV  DESCRIPTION OF PROCEDURE:   After the risks benefits and alternatives of the procedure were thoroughly explained, informed consent was obtained.  A digital rectal exam revealed no abnormalities of the rectum.   The LB [id number]  endoscope was introduced through the anus and advanced to the terminal ileum which was intubated for a short distance. No adverse events experienced.   The quality of the prep was good.  The instrument was then slowly withdrawn as the colon was fully examined.   COLON FINDINGS: The terminal ileum was normal.  There were several firm, apparently subepithelial nodules in the right colon.  There were 7-8 of them in total.  They were round, ranged in size from 23mm to47mm.  I tunnel biopsied one of the nodules (located in cecum).  There were two polyps that were found, removed and sent to pathology.  These were both sessile, located in transverse segment and rectum, measured 4-51mm across, removed with cold snare.  The examination was otherwise normal.  Retroflexed views revealed no abnormalities. The time to cecum=2 minutes 42 seconds.  Withdrawal time=12 minutes 27 seconds.  The scope was withdrawn and  the procedure completed. COMPLICATIONS: There were no complications.  ENDOSCOPIC IMPRESSION: See above  RECOMMENDATIONS: Await final pathology results.   eSigned:  Milus Banister, MD 08/11/2013 3:47 PM      PATIENT NAME:  Shannon, Stuart MR#: [id number]

## 2013-08-11 NOTE — Patient Instructions (Signed)
YOU HAD AN ENDOSCOPIC PROCEDURE TODAY AT THE [facility]: Refer to the procedure report that was given to you for any specific questions about what was found during the examination.  If the procedure report does not answer your questions, please call your gastroenterologist to clarify.  If you requested that your care partner not be given the details of your procedure findings, then the procedure report has been included in a sealed envelope for you to review at your convenience later.  YOU SHOULD EXPECT: Some feelings of bloating in the abdomen. Passage of more gas than usual.  Walking can help get rid of the air that was put into your GI tract during the procedure and reduce the bloating. If you had a lower endoscopy (such as a colonoscopy or flexible sigmoidoscopy) you may notice spotting of blood in your stool or on the toilet paper. If you underwent a bowel prep for your procedure, then you may not have a normal bowel movement for a few days.  DIET: Your first meal following the procedure should be a light meal and then it is ok to progress to your normal diet.  A half-sandwich or bowl of soup is an example of a good first meal.  Heavy or fried foods are harder to digest and may make you feel nauseous or bloated.  Likewise meals heavy in dairy and vegetables can cause extra gas to form and this can also increase the bloating.  Drink plenty of fluids but you should avoid alcoholic beverages for 24 hours.  ACTIVITY: Your care partner should take you home directly after the procedure.  You should plan to take it easy, moving slowly for the rest of the day.  You can resume normal activity the day after the procedure however you should NOT DRIVE or use heavy machinery for 24 hours (because of the sedation medicines used during the test).    SYMPTOMS TO REPORT IMMEDIATELY: A gastroenterologist can be reached at any hour.  During normal business hours, 8:30 AM to 5:00 PM Monday through Friday,  call [phone].  After hours and on weekends, please call the GI answering service at [phone] who will take a message and have the physician on call contact you.   Following lower endoscopy (colonoscopy or flexible sigmoidoscopy):  Excessive amounts of blood in the stool  Significant tenderness or worsening of abdominal pains  Swelling of the abdomen that is new, acute  Fever of 100F or higher  Following upper endoscopy (EGD)  Vomiting of blood or coffee ground material  New chest pain or pain under the shoulder blades  Painful or persistently difficult swallowing  New shortness of breath  Fever of 100F or higher  Black, tarry-looking stools  FOLLOW UP: If any biopsies were taken you will be contacted by phone or by letter within the next 1-3 weeks.  Call your gastroenterologist if you have not heard about the biopsies in 3 weeks.  Our staff will call the home number listed on your records the next business day following your procedure to check on you and address any questions or concerns that you may have at that time regarding the information given to you following your procedure. This is a courtesy call and so if there is no answer at the home number and we have not heard from you through the emergency physician on call, we will assume that you have returned to your regular daily activities without incident.  SIGNATURES/CONFIDENTIALITY: You and/or your care   partner have signed paperwork which will be entered into your electronic medical record.  These signatures attest to the fact that that the information above on your After Visit Summary has been reviewed and is understood.  Full responsibility of the confidentiality of this discharge information lies with you and/or your care-partner.   INFORMATION ON POLYPS AND GASTRITIS GIVEN TO YOU TODAY   AWAIT BIOPSY RESULTS AND PATHOLOGY

## 2013-08-11 NOTE — Progress Notes (Signed)
Called to room to assist during endoscopic procedure.  Patient ID and intended procedure confirmed with present staff. Received instructions for my participation in the procedure from the performing physician.  

## 2013-08-12 ENCOUNTER — Telehealth: Payer: Self-pay | Admitting: *Deleted

## 2013-08-12 NOTE — Telephone Encounter (Signed)
  Follow up Call-  Call back number 08/11/2013  Post procedure Call Back phone  # [phone]  Permission to leave phone message Yes     Patient questions:  Do you have a fever, pain , or abdominal swelling? yes Pain Score  7 *  Have you tolerated food without any problems? no  Have you been able to return to your normal activities? yes  Do you have any questions about your discharge instructions: Diet   no Medications  no Follow up visit  no  Do you have questions or concerns about your Care? no  Actions: * If pain score is 4 or above: Physician/ provider Notified : Owens Loffler, MD  P[t. States that she has sore throat, rates it at "7".  States "I just feel crappy".  Hurts to swallow.  She is  Drinking but hasn't had appetite for food.  States she took Ibuprofen at 0720 this am, my call initiated at 22.  Afebrile. Advised to call office if she has  Fever or if pain worsens.

## 2013-08-12 NOTE — Telephone Encounter (Signed)
1555 spoke with patient to check on how she is feeling this afternoon.  States she still has some soreness "5".  She is eating and drinking without difficulty.  Afebrile.  States she has taken Ibuprofen and Percocet as she has chronic pain.  Overall she is doing better this afternoon.  She will call after hours number if she has problems overnight. If she still feels that the pain is a problem she will call office in AM.  Advised her that having a sore throat after EGD is  Not uncommon.

## 2013-08-18 ENCOUNTER — Ambulatory Visit (INDEPENDENT_AMBULATORY_CARE_PROVIDER_SITE_OTHER): Payer: Medicare Other | Admitting: Family Medicine

## 2013-08-18 VITALS — BP 134/84 | HR 75 | Temp 98.4°F | Wt 227.0 lb

## 2013-08-18 DIAGNOSIS — L309 Dermatitis, unspecified: Secondary | ICD-10-CM

## 2013-08-18 DIAGNOSIS — L723 Sebaceous cyst: Secondary | ICD-10-CM | POA: Diagnosis not present

## 2013-08-18 DIAGNOSIS — L259 Unspecified contact dermatitis, unspecified cause: Secondary | ICD-10-CM | POA: Diagnosis not present

## 2013-08-18 MED ORDER — MOMETASONE FUROATE 0.1 % EX CREA: 1.0000 "application " | TOPICAL_CREAM | Freq: Every day | CUTANEOUS | Status: DC

## 2013-08-18 MED ORDER — CLINDAMYCIN HCL 300 MG PO CAPS
300.0000 mg | ORAL_CAPSULE | Freq: Two times a day (BID) | ORAL | Status: DC
Start: 2013-08-26 — End: 2013-10-12

## 2013-08-18 NOTE — Patient Instructions (Signed)
Wound Care Wound care helps prevent pain and infection.  You may need a tetanus shot if:  You cannot remember when you had your last tetanus shot.  You have never had a tetanus shot.  The injury broke your skin. If you need a tetanus shot and you choose not to have one, you may get tetanus. Sickness from tetanus can be serious. HOME CARE   Only take medicine as told by your doctor.  Clean the wound daily with mild soap and water.  Change any bandages (dressings) as told by your doctor.  Put medicated cream and a bandage on the wound as told by your doctor.  Change the bandage if it gets wet, dirty, or starts to smell.  Take showers. Do not take baths, swim, or do anything that puts your wound under water.  Rest and raise (elevate) the wound until the pain and puffiness (swelling) are better.  Keep all doctor visits as told. GET HELP RIGHT AWAY IF:   Yellowish-white fluid (pus) comes from the wound.  Medicine does not lessen your pain.  There is a red streak going away from the wound.  You have a fever. MAKE SURE YOU:   Understand these instructions.  Will watch your condition.  Will get help right away if you are not doing well or get worse. Document Released: 12/19/2007 Document Revised: 06/03/2011 Document Reviewed: 07/15/2010 Exit[employer] Patient Information 2014 [employer].  

## 2013-08-18 NOTE — Assessment & Plan Note (Signed)
Cyst on the back. Cyst removal recommended. Informed verbal and written consent obtained. Skin on the back well cleaned and prep for procedure. Procedure done under sterile condition. About 1cm incision was made over the cyst after injecting 2% Lidocaine with epi. Large amount of mucus like material milked out likely from cyst rupture. Cyst capsule was later removed. Incision was closed with Nylon suture. Wound clean and well dressed. Wound care instruction was given. Patient instructed to return for suture removal in 5 days. She agreed with plan.

## 2013-08-18 NOTE — Assessment & Plan Note (Signed)
Etiology unclear,?? Contact dermatitis, lesion not typical for psoriasis. Plan to use medium potency steroid for 1-2 wks. Return to derm clinic in 2 wks for reassessment, if no improvement we will biopsy the lesion. Patient agreed with plan.

## 2013-08-18 NOTE — Progress Notes (Signed)
Subjective:     Patient ID: Shannon Stuart, female   DOB: [birth year], 53 y.o.   MRN: [id number]  HPI Back lesion/Cyst: Place on her back that gets big and then burst, the lesion is recurrent for the last 1 yr, she does not know how the lesion looks like since it is on her back. Lesion is painful when it big, it can also be itchy. No fever, chills. No Hx of cancer. 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 Left shin lesion: C/O lesion on her left shin that started 2 wks ago, this is recurrent, typically she will get lesion on any part of her body and then goes away by using a prescribed cream which she cannot remember the name. Lesion can be itchy at times. Denies skin contact with person with skin rash, no change in diet.   Current Outpatient Prescriptions on File Prior to Visit  Medication Sig Dispense Refill  . CHROMIUM-CINNAMON PO Take 1 tablet by mouth.      . clonazePAM (KLONOPIN) 1 MG tablet Take 1 tablet (1 mg total) by mouth 3 (three) times daily as needed for anxiety.  90 tablet  1  . cyclobenzaprine (FLEXERIL) 10 MG tablet TAKE 1 TABLET THREE TIMES A DAY AS NEEDED FOR MUSCLE SPASMS & BACK PAIN  90 tablet  1  . Ginkgo Biloba (GINKOBA PO) Take 1 tablet by mouth.      Marland Kitchen ibuprofen (ADVIL,MOTRIN) 800 MG tablet Take 1 tablet (800 mg total) by mouth every 8 (eight) hours as needed for moderate pain.  90 tablet  4  . lisinopril-hydrochlorothiazide (PRINZIDE,ZESTORETIC) 20-25 MG per tablet Take 1 tablet by mouth daily.  90 tablet  11  . medroxyPROGESTERone (DEPO-PROVERA) 150 MG/ML injection Inject 1 mL (150 mg total) into the muscle every 3 (three) months.  1 mL  0  . metoprolol tartrate (LOPRESSOR) 25 MG tablet Take 1 tablet (25 mg total) by mouth 2 (two) times daily.  180 tablet  4  . nitroGLYCERIN (NITROSTAT) 0.4 MG SL tablet Place 1 tablet (0.4 mg total) under the tongue every 5 (five) minutes as needed for chest pain.  15 tablet  0  . oxyCODONE-acetaminophen (ROXICET) 5-325  MG per tablet Take 1 tablet by mouth every 8 (eight) hours as needed. Do not fill prior to 07/03/13  90 tablet  0  . pantoprazole (PROTONIX) 40 MG tablet Take 1 tablet (40 mg total) by mouth daily.  30 tablet  3  . promethazine (PHENERGAN) 25 MG suppository Place 1 suppository (25 mg total) rectally every 6 (six) hours as needed for nausea or vomiting.  12 each  0  . zolpidem (AMBIEN) 5 MG tablet Take 1 tablet (5 mg total) by mouth at bedtime as needed for sleep.  30 tablet  2   No current facility-administered medications on file prior to visit.   Past Medical History  Diagnosis Date  . Allergy   . Anxiety   . Depression   . Hyperlipidemia     pt denies 07/20/13  . Hypertension   . Endometriosis   . GERD (gastroesophageal reflux disease)   . Obesity, Class II, BMI 35-39.9       Review of Systems  Skin:       Skin lesion on the back and left LL   Filed Vitals:   08/18/13 1535  BP: 134/84  Pulse: 75  Temp: 98.4 F (36.9 C)  TempSrc: Oral  Weight: 227 lb (102.967 kg)  Objective:   Physical Exam  Nursing note and vitals reviewed. Constitutional: She appears well-developed. No distress.  Skin:  A 1.5cm by 1 cm raised mass on the left side of her back,firm and rubbery,mildly tender to palpation,no surrounding erythema. Picture taken and pasted on chart after patient verbally consented and signed consent.  Hyperpigmented macular,dry scaly lesion on left LL shin, measuring about 0.5cm by 0.5cm. Picture pasted. Multiple skin tags all over her body.           Assessment:     Sebaceous cyst  Dermatitis    Plan:     Check problem list

## 2013-08-25 ENCOUNTER — Ambulatory Visit (INDEPENDENT_AMBULATORY_CARE_PROVIDER_SITE_OTHER): Payer: Medicare Other | Admitting: *Deleted

## 2013-08-25 ENCOUNTER — Other Ambulatory Visit: Payer: Self-pay

## 2013-08-25 DIAGNOSIS — G8929 Other chronic pain: Secondary | ICD-10-CM

## 2013-08-25 DIAGNOSIS — R1013 Epigastric pain: Secondary | ICD-10-CM

## 2013-08-25 DIAGNOSIS — R112 Nausea with vomiting, unspecified: Secondary | ICD-10-CM

## 2013-08-25 DIAGNOSIS — Z4802 Encounter for removal of sutures: Secondary | ICD-10-CM

## 2013-08-25 DIAGNOSIS — R109 Unspecified abdominal pain: Secondary | ICD-10-CM

## 2013-08-25 NOTE — Progress Notes (Signed)
   Pt in nurse clinic for suture removal on upper back.  Suture placed on 08/18/2013.  One suture in place, one suture removed without difficult.  Area slightly red, no drainage noted.  Per pt request small band aide placed over site.  Pt advised if any swelling, fever, drainage or increase redness to call clinic for appt. Pt stated understanding.  [name], RN

## 2013-08-31 ENCOUNTER — Ambulatory Visit (INDEPENDENT_AMBULATORY_CARE_PROVIDER_SITE_OTHER)
Admission: RE | Admit: 2013-08-31 | Discharge: 2013-08-31 | Disposition: A | Discharge status: Home / Self Care | Payer: Medicare Other | Source: Ambulatory Visit | Attending: Gastroenterology | Admitting: Gastroenterology

## 2013-08-31 DIAGNOSIS — R112 Nausea with vomiting, unspecified: Secondary | ICD-10-CM | POA: Diagnosis not present

## 2013-08-31 DIAGNOSIS — R109 Unspecified abdominal pain: Secondary | ICD-10-CM | POA: Diagnosis not present

## 2013-08-31 DIAGNOSIS — D259 Leiomyoma of uterus, unspecified: Secondary | ICD-10-CM | POA: Diagnosis not present

## 2013-08-31 MED ORDER — IOHEXOL 300 MG/ML  SOLN: 100.0000 mL | Freq: Once | INTRAMUSCULAR | Status: AC | PRN

## 2013-09-01 ENCOUNTER — Ambulatory Visit (INDEPENDENT_AMBULATORY_CARE_PROVIDER_SITE_OTHER): Payer: Medicare Other | Admitting: Family Medicine

## 2013-09-01 ENCOUNTER — Encounter: Payer: Self-pay | Admitting: Family Medicine

## 2013-09-01 ENCOUNTER — Other Ambulatory Visit (HOSPITAL_COMMUNITY)
Admission: RE | Admit: 2013-09-01 | Discharge: 2013-09-01 | Disposition: A | Discharge status: Home / Self Care | Payer: Medicare Other | Source: Ambulatory Visit | Attending: Family Medicine | Admitting: Family Medicine

## 2013-09-01 VITALS — BP 138/83 | HR 95 | Temp 99.1°F | Wt 222.7 lb

## 2013-09-01 DIAGNOSIS — F411 Generalized anxiety disorder: Secondary | ICD-10-CM

## 2013-09-01 DIAGNOSIS — Z01419 Encounter for gynecological examination (general) (routine) without abnormal findings: Secondary | ICD-10-CM

## 2013-09-01 DIAGNOSIS — IMO0002 Reserved for concepts with insufficient information to code with codable children: Secondary | ICD-10-CM

## 2013-09-01 DIAGNOSIS — Z124 Encounter for screening for malignant neoplasm of cervix: Secondary | ICD-10-CM

## 2013-09-01 DIAGNOSIS — R7303 Prediabetes: Secondary | ICD-10-CM

## 2013-09-01 DIAGNOSIS — M170 Bilateral primary osteoarthritis of knee: Secondary | ICD-10-CM

## 2013-09-01 DIAGNOSIS — Z0000 Encounter for general adult medical examination without abnormal findings: Secondary | ICD-10-CM | POA: Diagnosis not present

## 2013-09-01 DIAGNOSIS — Z1151 Encounter for screening for human papillomavirus (HPV): Secondary | ICD-10-CM | POA: Diagnosis not present

## 2013-09-01 DIAGNOSIS — R7309 Other abnormal glucose: Secondary | ICD-10-CM

## 2013-09-01 DIAGNOSIS — M1710 Unilateral primary osteoarthritis, unspecified knee: Secondary | ICD-10-CM | POA: Diagnosis not present

## 2013-09-01 MED ORDER — LORAZEPAM 1 MG PO TABS: ORAL_TABLET | ORAL | Status: DC

## 2013-09-01 MED ORDER — OXYCODONE-ACETAMINOPHEN 10-325 MG PO TABS: 1.0000 | ORAL_TABLET | Freq: Every day | ORAL | Status: DC | PRN

## 2013-09-01 MED ORDER — OXYCODONE-ACETAMINOPHEN 5-325 MG PO TABS: 1.0000 | ORAL_TABLET | Freq: Three times a day (TID) | ORAL | Status: DC | PRN

## 2013-09-01 NOTE — Patient Instructions (Signed)
We will work on the diet to help with the cholesterol and the pre-diabetes.   We will repeat labwork in 3 months.   You will receive a letter in the mail with the PAP smear results.

## 2013-09-02 DIAGNOSIS — Z01419 Encounter for gynecological examination (general) (routine) without abnormal findings: Secondary | ICD-10-CM | POA: Insufficient documentation

## 2013-09-02 LAB — CYTOLOGY - PAP

## 2013-09-02 MED ORDER — PANTOPRAZOLE SODIUM 40 MG PO TBEC: 40.0000 mg | DELAYED_RELEASE_TABLET | Freq: Every day | ORAL | Status: DC

## 2013-09-02 NOTE — Progress Notes (Signed)
53 y.o. year old female presents for well woman/preventative visit and annual GYN examination.  Acute Concerns: - low back pain and bilateral knee pain from severe osteoarthritis. She has 10 times per month where she takes 2 percocet 5/325 at a time due to severe pain. She was talking with her pharmacist who recommended that she get a limited number of higher strength tablets for those times, so that she doesn't run out of the 5/325. She continues to walk and go to the gym on a regular basis.  - has HIDA procedure scheduled next week that will require her to be laying down for an extended period of time. She would like something to take prior to the procedure to help her relax.    Diet: Has recently tried to eat less fried foods, eating baked and grilled meats. Eats some vegetables and has tried to cut down on starchy foods.   Exercise: Goes to the gym 3-4 times per week where she rides the bike and walks Sexual/Birth History: Not sexually active. Denies vaginal discharge.  Continues to have monthly period Birth Control: On depo for management of dysmenorrhea and menorrhagia.   Social:  History   Social History  . Marital Status: Single    Spouse Name: N/A    Number of Children: 1  . Years of Education: N/A   Occupational History  . retired    Social History Main Topics  . Smoking status: Former Smoker    Types: Cigarettes    Quit date: 03/26/1991  . Smokeless tobacco: Never Used  . Alcohol Use: Yes     Comment: 'SOCIALLY"  . Drug Use: Yes    Special: Marijuana     Comment: "social smoking"  . Sexual Activity: Not Currently    Partners: Female    Birth Control/ Protection: Injection   Other Topics Concern  . None   Social History Narrative  . None    Immunization:  Tdap/TD: 11/23/2005  Influenza: 01/22/13  Pneumococcal: none  Herpes Zoster: none  Cancer Screening:  Pap Smear: last PAP: 05/05/2008: normal PAP smear with endometrial cells seen  Mammogram:  04/27/2003  Colonoscopy: 08/11/13: 2 sessile polyps and several firm subepithelial nodules in right colon 5-15mm in size.   Dexa: none  Physical Exam: VITALS: Filed Vitals:   09/01/13 1011  BP: 138/83  Pulse: 95  Temp: 99.1 F (37.3 C)  TempSrc: Oral  Weight: 222 lb 11.2 oz (101.016 kg)   Physical Examination: General appearance - alert, well appearing, and in no distress Neck - supple, no significant adenopathy Chest - clear to auscultation, no wheezes, rales or rhonchi, symmetric air entry Heart - normal rate, regular rhythm, normal S1, S2, no murmurs, rubs, clicks or gallops Abdomen - soft, nontender, nondistended, no masses or organomegaly Breasts - breasts appear normal, no suspicious masses, no skin or nipple changes or axillary nodes Pelvic - normal external genitalia, vulva, vagina, cervix, uterus and adnexa Musculoskeletal - tenderness to palpation along L spine and bilateral paralumbar muscles, 5/5 strength with knee extension, flexion, foot dorsiflexion and plantarflexion bilaterally, absent patellar reflexes bilaterally, negative straight leg bilaterally Extremities - peripheral pulses normal, no pedal edema Skin - normal coloration and turgor, round patch on left shin    ASSESSMENT & PLAN: 53 y.o. female presents for annual well woman/preventative exam and GYN exam. Please see problem specific assessment and plan.    - PAP done today - patient to make appointment for mammogram - colonoscopy: done and followed up by  CT abdomen for nodules found on colonoscopy. CT showing no mass lesions or adenopathy. Followed by GI.  - reviewed lipid results and A1C putting her in prediabetes category. Patient would like to try diet changes first. Repeat labwork in 3 months.   Liam Graham, PGY-3 Family Medicine Resident

## 2013-09-02 NOTE — Assessment & Plan Note (Signed)
-   PAP done today - patient to make appointment for mammogram - colonoscopy: done and followed up by CT abdomen for nodules found on colonoscopy. CT showing no mass lesions or adenopathy. Followed by GI.  - reviewed lipid results and A1C putting her in prediabetes category. Patient would like to try diet changes first. Repeat labwork in 3 months.

## 2013-09-02 NOTE — Assessment & Plan Note (Signed)
Anxiety likely prior to scheduled HIDA scan. Will prescribe ativan 1-2mg  to take prior to procedure. Patient already on clonazepam.

## 2013-09-02 NOTE — Assessment & Plan Note (Signed)
Notified patient. Patient would like to work on diet.  Repeat A1C and lipid panel in 3 months.

## 2013-09-02 NOTE — Assessment & Plan Note (Addendum)
Refilled percocet 5/325 90 tab, tid/prn. Also gave Rx for 10tablets of percocet 10/325 no refills for when pain is worst.

## 2013-09-06 ENCOUNTER — Encounter: Payer: Self-pay | Admitting: Family Medicine

## 2013-09-07 ENCOUNTER — Encounter (HOSPITAL_COMMUNITY)
Admission: RE | Admit: 2013-09-07 | Discharge: 2013-09-07 | Disposition: A | Discharge status: Home / Self Care | Payer: Medicare Other | Source: Ambulatory Visit | Attending: Gastroenterology | Admitting: Gastroenterology

## 2013-09-07 DIAGNOSIS — R109 Unspecified abdominal pain: Secondary | ICD-10-CM

## 2013-09-07 DIAGNOSIS — R112 Nausea with vomiting, unspecified: Secondary | ICD-10-CM

## 2013-09-14 ENCOUNTER — Other Ambulatory Visit: Payer: Self-pay | Admitting: Family Medicine

## 2013-09-16 ENCOUNTER — Other Ambulatory Visit: Payer: Self-pay | Admitting: Family Medicine

## 2013-09-23 ENCOUNTER — Ambulatory Visit (HOSPITAL_COMMUNITY)
Admission: RE | Admit: 2013-09-23 | Discharge: 2013-09-23 | Disposition: A | Discharge status: Home / Self Care | Payer: Medicare Other | Source: Ambulatory Visit | Attending: Gastroenterology | Admitting: Gastroenterology

## 2013-09-23 DIAGNOSIS — R112 Nausea with vomiting, unspecified: Secondary | ICD-10-CM | POA: Insufficient documentation

## 2013-09-23 DIAGNOSIS — R109 Unspecified abdominal pain: Secondary | ICD-10-CM | POA: Diagnosis not present

## 2013-09-23 MED ORDER — TECHNETIUM TC 99M MEBROFENIN IV KIT
5.5000 | PACK | Freq: Once | INTRAVENOUS | Status: AC | PRN
  Administered 2013-09-23: 6 via INTRAVENOUS

## 2013-09-23 MED ORDER — SINCALIDE 5 MCG IJ SOLR
0.0200 ug/kg | Freq: Once | INTRAMUSCULAR | Status: AC
  Administered 2013-09-23: 2 ug via INTRAVENOUS

## 2013-09-28 ENCOUNTER — Ambulatory Visit (INDEPENDENT_AMBULATORY_CARE_PROVIDER_SITE_OTHER): Payer: Medicare Other | Admitting: *Deleted

## 2013-09-28 DIAGNOSIS — N925 Other specified irregular menstruation: Secondary | ICD-10-CM | POA: Diagnosis not present

## 2013-09-28 DIAGNOSIS — N938 Other specified abnormal uterine and vaginal bleeding: Secondary | ICD-10-CM

## 2013-09-28 DIAGNOSIS — N949 Unspecified condition associated with female genital organs and menstrual cycle: Secondary | ICD-10-CM

## 2013-09-28 MED ORDER — MEDROXYPROGESTERONE ACETATE 150 MG/ML IM SUSP
150.0000 mg | Freq: Once | INTRAMUSCULAR | Status: AC
  Administered 2013-09-28: 150 mg via INTRAMUSCULAR

## 2013-09-28 NOTE — Progress Notes (Signed)
   Pt in for Depo Provera injection.  Pt tolerated Depo injection. Depo given Left upper outer quadrant.  Next injection due Sept 22 - Dec 28, 2013.  Reminder card given. [name], RN

## 2013-09-30 ENCOUNTER — Other Ambulatory Visit: Payer: Self-pay

## 2013-09-30 ENCOUNTER — Telehealth: Payer: Self-pay

## 2013-09-30 DIAGNOSIS — R1084 Generalized abdominal pain: Secondary | ICD-10-CM

## 2013-09-30 NOTE — Telephone Encounter (Signed)
[facility] to notify

## 2013-09-30 NOTE — Telephone Encounter (Signed)
Message copied by Barron Alvine on Thu Sep 30, 2013  9:52 AM ------      Message from: [facility]      Created: Thu Sep 30, 2013  9:48 AM       Pt is scheduled for 7-24 arrive at 9.00am for a 9.20am apt with Dr Donnie Mesa.Marland KitchenMarland KitchenThanks Thayer Headings      ----- Message -----         From: Barron Alvine, CMA         Sent: 09/30/2013   9:12 AM           To: [facility]            Pt needs appt with surgeon for possible gallbladder removal        ------

## 2013-10-06 ENCOUNTER — Ambulatory Visit (INDEPENDENT_AMBULATORY_CARE_PROVIDER_SITE_OTHER): Payer: Medicare Other | Admitting: Family Medicine

## 2013-10-06 VITALS — BP 129/83 | HR 81 | Temp 99.1°F | Ht 67.0 in | Wt 209.9 lb

## 2013-10-06 DIAGNOSIS — L259 Unspecified contact dermatitis, unspecified cause: Secondary | ICD-10-CM | POA: Diagnosis not present

## 2013-10-06 DIAGNOSIS — L723 Sebaceous cyst: Secondary | ICD-10-CM

## 2013-10-06 DIAGNOSIS — L309 Dermatitis, unspecified: Secondary | ICD-10-CM

## 2013-10-06 MED ORDER — METHYLPREDNISOLONE ACETATE 40 MG/ML IJ SUSP
40.0000 mg | Freq: Once | INTRAMUSCULAR | Status: AC
  Administered 2013-10-06: 40 mg via INTRALESIONAL

## 2013-10-06 MED ORDER — DOXYCYCLINE HYCLATE 100 MG PO TABS: 100.0000 mg | ORAL_TABLET | Freq: Every day | ORAL | Status: DC

## 2013-10-06 NOTE — Patient Instructions (Addendum)
SEBACEOUS CYST REMOVAL Post-operatively, a dressing may be placed over the stitches. It is important that this dressing be kept clean, dry, and intact for the first 72 hours following surgery. Under the dressing, stitches may be present and are typically the dissolvable type. Sometimes, antibiotics are prescribed in order to prevent infection from occurring. In some cases, pain medication is also prescribed, but most procedures are adequately treated with over the counter Children's Tylenol. We advise no sports or swimming for two weeks after the procedure to aid in healing. The bandage and/or stitches may be removed (if it has not already come off) or trimmed at the post-operative visits.   After a sebaceous cyst excision, there may be some mild pain at the surgical site, itchiness, and numbness. Mild pain may take 1-2 weeks to subside, while itchiness and numbness may take several weeks to subside as the skin heals.  Complications are uncommon after excision of a sebaceous cyst. But the most frequent complications after surgery include scarring. Scarring is inevitable anytime there is a cut or trauma of any kind to the skin. Some scars are barely if at all visible while some scars are disfigured, red, dark, or raised and/or form a keloid. It is of special importance to be proactive with preventing, caring, and treating scars. Scar treatment recommendations may include sunscreen, scar bandages such as silicone sheeting, scar creams or gels, and/or a scar massage.  Infection is another complication that may be seen after surgery. Signs and symptoms of infection include fever, swelling, pus drainage, pain, and redness. However, some of these signs and symptoms are normal to an extent. If there's any concern, our office should be contacted immediately. Infections resolve typically with medical treatment of oral and/or topical  antibiotics.

## 2013-10-06 NOTE — Assessment & Plan Note (Signed)
Improved with elocon cream. Continue cream prn.

## 2013-10-06 NOTE — Assessment & Plan Note (Signed)
Post cyst removal itching and pain. Patient reassured it is common to have itching with healing after cyst removal for weeks. Intralesional steroid injection recommended to help relieve her symptoms. Informed and written consent obtained. Procedure was well tolerated. Prophylactic antibiotic was given. F/U in 2-4 wks for reassessment.

## 2013-10-06 NOTE — Progress Notes (Signed)
Subjective:     Patient ID: Shannon Stuart, female   DOB: 04/24/1960, 53 y.o.   MRN: [id number]  HPI Sebaceous Cyst:Patient stated since she had her sebaceous cyst removed more than 1 month ago she has been having some itching and occasional pain inside. She denies any swelling,no pus or discharge. She is here to get this checked. Dermatitis: here to follow up with lesion on her left shin which has improved a lot since starting topical steroid, itching has resolved as well.  Current Outpatient Prescriptions on File Prior to Visit  Medication Sig Dispense Refill  . CHROMIUM-CINNAMON PO Take 1 tablet by mouth.      . clindamycin (CLEOCIN) 300 MG capsule Take 1 capsule (300 mg total) by mouth every 12 (twelve) hours.  14 capsule  0  . clonazePAM (KLONOPIN) 1 MG tablet Take 1 tablet (1 mg total) by mouth 3 (three) times daily as needed for anxiety.  90 tablet  1  . cyclobenzaprine (FLEXERIL) 10 MG tablet TAKE 1 TABLET THREE TIMES A DAY AS NEEDED FOR MUSCLE SPASMS & BACK PAIN  90 tablet  1  . Ginkgo Biloba (GINKOBA PO) Take 1 tablet by mouth.      Marland Kitchen ibuprofen (ADVIL,MOTRIN) 800 MG tablet Take 1 tablet (800 mg total) by mouth every 8 (eight) hours as needed for moderate pain.  90 tablet  4  . lisinopril-hydrochlorothiazide (PRINZIDE,ZESTORETIC) 20-25 MG per tablet Take 1 tablet by mouth daily.  90 tablet  11  . LORazepam (ATIVAN) 1 MG tablet Take 1-2 prior to HIDA scan procedure  4 tablet  0  . medroxyPROGESTERone (DEPO-PROVERA) 150 MG/ML injection Inject 1 mL (150 mg total) into the muscle every 3 (three) months.  1 mL  0  . metoprolol tartrate (LOPRESSOR) 25 MG tablet Take 1 tablet (25 mg total) by mouth 2 (two) times daily.  180 tablet  4  . mometasone (ELOCON) 0.1 % cream Apply 1 application topically daily. Apply to affected skin daily.  45 g  0  . nitroGLYCERIN (NITROSTAT) 0.4 MG SL tablet Place 1 tablet (0.4 mg total) under the tongue every 5 (five) minutes as needed for chest pain.  15 tablet  0    . oxyCODONE-acetaminophen (PERCOCET) 10-325 MG per tablet Take 1 tablet by mouth daily as needed for pain.  10 tablet  0  . oxyCODONE-acetaminophen (ROXICET) 5-325 MG per tablet Take 1 tablet by mouth every 8 (eight) hours as needed.  90 tablet  0  . pantoprazole (PROTONIX) 40 MG tablet Take 1 tablet (40 mg total) by mouth daily.  90 tablet  3  . promethazine (PHENERGAN) 25 MG suppository Place 1 suppository (25 mg total) rectally every 6 (six) hours as needed for nausea or vomiting.  12 each  0  . zolpidem (AMBIEN) 5 MG tablet Take 1 tablet (5 mg total) by mouth at bedtime as needed for sleep.  30 tablet  2   No current facility-administered medications on file prior to visit.   Past Medical History  Diagnosis Date  . Allergy   . Anxiety   . Depression   . Hyperlipidemia     pt denies 07/20/13  . Hypertension   . Endometriosis   . GERD (gastroesophageal reflux disease)   . Obesity, Class II, BMI 35-39.9       Review of Systems  Respiratory: Negative.   Cardiovascular: Negative.   Skin: Positive for rash.       Sebaceous cyst.  All other systems  reviewed and are negative.  Filed Vitals:   10/06/13 1336  BP: 129/83  Pulse: 81  Temp: 99.1 F (37.3 C)  TempSrc: Oral  Height: 5\' 7"  (1.702 m)  Weight: 209 lb 14.4 oz (95.21 kg)       Objective:   Physical Exam  Nursing note and vitals reviewed. Constitutional: She appears well-developed. No distress.  Cardiovascular: Normal rate, regular rhythm, normal heart sounds and intact distal pulses.   No murmur heard. Pulmonary/Chest: Effort normal and breath sounds normal. No respiratory distress. She has no wheezes.  Musculoskeletal: She exhibits no edema.  Skin:  Macular rash on her left shin has cleared and improved a lot when compared to picture dated 08/18/13.  Presence of incisional scar on her left upper back s/p sebaceous cyst removal > 1 month ago,slightly raised firm lesion (likely scar tissue). Very mild surrounding  erythema after manipulation (trying to milk out potential pus). No pus or discharge. Picture compared to 08/18/13.           Assessment:     Sebaceous cyst: S/P excision with persistent itching and irritation Dermatitis: Unspecified.     Plan:     Check problem list.  Total time spent for this face to face encounter and coordination of care was more than 30 min.     _

## 2013-10-12 ENCOUNTER — Ambulatory Visit (INDEPENDENT_AMBULATORY_CARE_PROVIDER_SITE_OTHER): Payer: Medicare Other | Admitting: Family Medicine

## 2013-10-12 ENCOUNTER — Encounter: Payer: Self-pay | Admitting: Family Medicine

## 2013-10-12 VITALS — BP 125/75 | HR 63 | Temp 98.1°F | Resp 16 | Wt 213.0 lb

## 2013-10-12 DIAGNOSIS — M545 Low back pain, unspecified: Secondary | ICD-10-CM | POA: Diagnosis not present

## 2013-10-12 DIAGNOSIS — E669 Obesity, unspecified: Secondary | ICD-10-CM | POA: Diagnosis not present

## 2013-10-12 DIAGNOSIS — G8929 Other chronic pain: Secondary | ICD-10-CM

## 2013-10-12 MED ORDER — OXYCODONE-ACETAMINOPHEN 10-325 MG PO TABS: 1.0000 | ORAL_TABLET | Freq: Every day | ORAL | Status: DC | PRN

## 2013-10-12 MED ORDER — ZOLPIDEM TARTRATE 5 MG PO TABS: 5.0000 mg | ORAL_TABLET | Freq: Every evening | ORAL | Status: DC | PRN

## 2013-10-12 MED ORDER — OXYCODONE-ACETAMINOPHEN 5-325 MG PO TABS
1.0000 | ORAL_TABLET | Freq: Three times a day (TID) | ORAL | Status: DC | PRN
Start: 2013-10-12 — End: 2013-10-12

## 2013-10-12 MED ORDER — OXYCODONE-ACETAMINOPHEN 5-325 MG PO TABS: 1.0000 | ORAL_TABLET | Freq: Three times a day (TID) | ORAL | Status: DC | PRN

## 2013-10-12 MED ORDER — KETOROLAC TROMETHAMINE 60 MG/2ML IM SOLN
60.0000 mg | Freq: Once | INTRAMUSCULAR | Status: AC
  Administered 2013-10-12: 60 mg via INTRAMUSCULAR

## 2013-10-12 NOTE — Assessment & Plan Note (Signed)
+/-   recent 10 lb weight loss over several months  Plan: 1. Continue daily exercise, walking 2. Improve diet, consider nutrition referral if needed

## 2013-10-12 NOTE — Assessment & Plan Note (Signed)
Chronic LBP, hx prior L-spine disc surgery x 2 (1988), some bilateral radicular symptoms, no significant weakness - Controlled on Percocet, remains functional, daily walking / exercise  Plan: 1. Refilled Percocet 5/325s, printed 3 month supply (#90 monthly), refilled Percocet 10/325mg  for breakthrough only (to limit 5/325 use), (#15, 0 refills) x 2 2. Toradol 60mg  IM injection in clinic today 3. Continue Flexeril PRN, Ibuprofen PRN, recommend Tylenol (pt does not take) 4. Recommended trial of Gabapentin (pt declines, not interested in adding new medication - concerned about side-effects) 5. Discuss alternative pain management options in future - TCAs, Cymbalta etc. 6. Continue exercise 7. Consider trial PT in future 8. RTC 2-3 months, f/u Ortho as needed

## 2013-10-12 NOTE — Patient Instructions (Addendum)
Dear Shannon Stuart, Thank you for coming in to clinic today. It was good to meet you!  Today we discussed your Chronic Pain. 1. Refilled Percocet 5s (3 month supply) and 10s 2. Refilled Ambien 3. Received Toradol injection today - return in 1.5 months 4. Keep up the good work with staying active, it sounds like you are doing the best that you can, and it will help your pain and make you feel better in the long run. 5. Please consider an alternative pain medication in the future that may help control your symptoms more.  Please schedule a follow-up appointment with PCP (Dr. Brita Romp) in 2-3 months for follow-up  If you have any other questions or concerns, please feel free to call the clinic to contact me. You may also schedule an earlier appointment if necessary.  However, if your symptoms get significantly worse, please go to the Emergency Department to seek immediate medical attention.  Nobie Putnam, [place]

## 2013-10-12 NOTE — Progress Notes (Signed)
Subjective:     Patient ID: Shannon Stuart, female   DOB: [birth year], 53 y.o.   MRN: [id number]  HPI  CHRONIC PAIN  - Hx Osteoarthritis (b/l knees, low-back), reports chronic hx pain in bilateral lower back, bilateral knees, occasional hips - Previously followed by Orthopedic Surgery, Hx 2 back surgeries, not interested in 3rd surgery, also not interested in bilateral knee replacement surgery - Describes pain as constant aching, worse with getting up and sitting down, daily in low back and both knees, with hips occasional h/o most recent fall late May d/t knee giving out - tries to avoid focusing on pain, stays active daily regardless of pain, does affect activity, bilateral knee buckling at times - Request refill on pain medicine (Percocet 5s and 10s) - Takes 3-4 Percocet 5/325s daily, and Percocet 10/325s only for breakthrough pain (to avoid using inc amounts of 5/325s, previously recommend by pharmacist / prior PCP - Additionally, gets Toradol 60mg  IM every other month, for > 1 year - Admits to some shooting pains, numbness radiating down bilateral legs - Denies fevers/chills, bowel / bladder incontinence  OBESITY: - Admits to about 10 lb wt loss recently over past several months, some +/- recently within 76month - exercise and walk about 1 mile daily  PMH: - Chronic Abdominal Pain, followed by General Surgery - Scheduled to meet Surgeon on Friday to discuss cholecystectomy, hx projectile vomiting diarrhea  I have reviewed and updated the following as appropriate: allergies and current medications  Social Hx: Former smoker - Retired Psychologist, counselling, on disability  Review of Systems  See above HPI     Objective:   Physical Exam  BP 125/75  Pulse 63  Temp(Src) 98.1 F (36.7 C) (Oral)  Resp 16  Wt 213 lb (96.616 kg)  SpO2 98%  Gen - well-appearing, NAD Heart - RRR, no murmurs heard Lungs - CTAB, no wheezing, crackles, or rhonchi. Normal work of breathing. Abd - soft,  +epigastric tenderness to palpation, non-distended, +active BS Ext - non-tender, no edema, peripheral pulses intact +2 b/l Skin - warm, dry, no rashes MSK - +tenderness to palpation lower L-spine and bilateral paraspinal muscles with noted hypertonicity, limited forward flexion due to pain. B/l knees (R>L): +TTP anterior, joint line non-tender, no erythema or effusion. Seated slump test +pain / tightness, symptoms bilaterally R>L  Neuro - awake, alert, oriented, grossly non-focal, intact muscle strength 5/5 b/l, intact distal sensation to light touch, gait cautious due to pain without limp     Assessment:     See specific A&P problem list for details.      Plan:     See specific A&P problem list for details.

## 2013-10-15 ENCOUNTER — Encounter (INDEPENDENT_AMBULATORY_CARE_PROVIDER_SITE_OTHER): Payer: Self-pay | Admitting: Surgery

## 2013-10-15 ENCOUNTER — Ambulatory Visit (INDEPENDENT_AMBULATORY_CARE_PROVIDER_SITE_OTHER): Payer: Medicare Other | Admitting: Surgery

## 2013-10-15 VITALS — BP 116/72 | HR 49 | Temp 96.3°F | Ht 68.0 in | Wt 212.0 lb

## 2013-10-15 DIAGNOSIS — K828 Other specified diseases of gallbladder: Secondary | ICD-10-CM | POA: Diagnosis not present

## 2013-10-15 NOTE — Progress Notes (Signed)
Patient ID: Shannon Stuart, female   DOB: 09/23/1960, 53 y.o.   MRN: [id number]  Chief Complaint  Patient presents with  . eval gallbladder    HPI Shannon Stuart is a 53 y.o. female.   Referred by Dr. Oretha Caprice for evaluation of biliary dyskinesia HPI  This is a 53 year old female who presents with several months of persistent, worsening epigastric abdominal pain with radiation through to her back. This is associated with nausea, vomiting, diarrhea, and abdominal bloating. She has tried to alter her diet but there's been minimal improvement. A previous CT scan showed no sign of cholelithiasis. A HIDA scan showed a decreased gallbladder ejection fraction at 27 with exacerbation of her symptoms. Colonoscopy was unremarkable. She presents now to discuss elective cholecystectomy. 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    Past Medical History  Diagnosis Date  . Allergy   . Anxiety   . Depression   . Hyperlipidemia     pt denies 07/20/13  . Hypertension   . Endometriosis   . GERD (gastroesophageal reflux disease)   . Obesity, Class II, BMI 35-39.9     Past Surgical History  Procedure Laterality Date  . Brain tumor excision    . Lumbar disc surgery  04/1986, 12/1986    x 2  . Tissue graft      Family History  Problem Relation Age of Onset  . Diabetes Maternal Grandmother   . Heart failure Maternal Grandmother     CHF  . Diabetes Sister   . Diabetes Sister     gestational  . Heart disease Mother 21    MI  . Hypertension Other     entire family  . Colon cancer Neg Hx   . Esophageal cancer Neg Hx   . Stomach cancer Neg Hx     Social History History  Substance Use Topics  . Smoking status: Former Smoker    Types: Cigarettes    Quit date: 03/26/1991  . Smokeless tobacco: Never Used  . Alcohol Use: Yes     Comment: 'SOCIALLY"    Allergies  Allergen Reactions  . Zofran [Ondansetron Hcl] Nausea And Vomiting    Current Outpatient Prescriptions   Medication Sig Dispense Refill  . CHROMIUM-CINNAMON PO Take 1 tablet by mouth.      . clonazePAM (KLONOPIN) 1 MG tablet Take 1 tablet (1 mg total) by mouth 3 (three) times daily as needed for anxiety.  90 tablet  1  . cyclobenzaprine (FLEXERIL) 10 MG tablet TAKE 1 TABLET THREE TIMES A DAY AS NEEDED FOR MUSCLE SPASMS & BACK PAIN  90 tablet  1  . Ginkgo Biloba (GINKOBA PO) Take 1 tablet by mouth.      Marland Kitchen ibuprofen (ADVIL,MOTRIN) 800 MG tablet Take 1 tablet (800 mg total) by mouth every 8 (eight) hours as needed for moderate pain.  90 tablet  4  . lisinopril-hydrochlorothiazide (PRINZIDE,ZESTORETIC) 20-25 MG per tablet Take 1 tablet by mouth daily.  90 tablet  11  . medroxyPROGESTERone (DEPO-PROVERA) 150 MG/ML injection Inject 1 mL (150 mg total) into the muscle every 3 (three) months.  1 mL  0  . metoprolol tartrate (LOPRESSOR) 25 MG tablet Take 1 tablet (25 mg total) by mouth 2 (two) times daily.  180 tablet  4  . mometasone (ELOCON) 0.1 % cream Apply 1 application topically daily. Apply to affected skin daily.  45 g  0  . nitroGLYCERIN (NITROSTAT) 0.4 MG SL tablet  Place 1 tablet (0.4 mg total) under the tongue every 5 (five) minutes as needed for chest pain.  15 tablet  0  . oxyCODONE-acetaminophen (PERCOCET) 10-325 MG per tablet Take 1 tablet by mouth daily as needed for pain (Breakthrough pain only).  15 tablet  0  . oxyCODONE-acetaminophen (ROXICET) 5-325 MG per tablet Take 1 tablet by mouth every 8 (eight) hours as needed.  90 tablet  0  . pantoprazole (PROTONIX) 40 MG tablet Take 1 tablet (40 mg total) by mouth daily.  90 tablet  3  . promethazine (PHENERGAN) 25 MG suppository Place 1 suppository (25 mg total) rectally every 6 (six) hours as needed for nausea or vomiting.  12 each  0  . zolpidem (AMBIEN) 5 MG tablet Take 1 tablet (5 mg total) by mouth at bedtime as needed for sleep.  30 tablet  2   No current facility-administered medications for this visit.    Review of Systems Review of  Systems  Constitutional: Negative for fever, chills and unexpected weight change.  HENT: Negative for congestion, hearing loss, sore throat, trouble swallowing and voice change.   Eyes: Negative for visual disturbance.  Respiratory: Negative for cough and wheezing.   Cardiovascular: Negative for chest pain, palpitations and leg swelling.  Gastrointestinal: Positive for nausea, vomiting, abdominal pain, diarrhea and abdominal distention. Negative for constipation, blood in stool and anal bleeding.  Genitourinary: Negative for hematuria, vaginal bleeding and difficulty urinating.  Musculoskeletal: Negative for arthralgias.  Skin: Negative for rash and wound.  Neurological: Negative for seizures, syncope and headaches.  Hematological: Negative for adenopathy. Does not bruise/bleed easily.  Psychiatric/Behavioral: Negative for confusion.    Blood pressure 116/72, pulse 49, temperature 96.3 F (35.7 C), height 5\' 8"  (1.727 m), weight 212 lb (96.163 kg).  Physical Exam Physical Exam WDWN in NAD HEENT:  EOMI, sclera anicteric Neck:  No masses, no thyromegaly Lungs:  CTA bilaterally; normal respiratory effort CV:  Regular rate and rhythm; no murmurs Abd:  +bowel sounds, soft, moderate epigastric tenderness; no palpable masses Ext:  Well-perfused; no edema Skin:  Warm, dry; no sign of jaundice  Data Reviewed Lab Results  Component Value Date   ALT 14 08/09/2013   AST 15 08/09/2013   ALKPHOS 59 08/09/2013   BILITOT 0.5 08/09/2013   Lab Results  Component Value Date   WBC 6.4 04/02/2013   HGB 12.7 04/02/2013   HCT 37.8 04/02/2013   MCV 85.9 04/02/2013   PLT 212 04/02/2013   Nm Hepato W/eject Fract  09/23/2013   CLINICAL DATA:  Upper abdominal pain with nausea and vomiting  EXAM: NUCLEAR MEDICINE HEPATOBILIARY IMAGING WITH GALLBLADDER EF  Views: Anterior right upper quadrant  Radionuclide:  Technetium 44m Choletec  Dose:  5.5 mCi  Route of administration: Intravenous  COMPARISON:  None.  FINDINGS:  Liver uptake of radiotracer is normal. There is prompt visualization of gallbladder and small bowel, indicating patency of the cystic and common bile ducts. A weight based dose, 2.0 mcg, of CCK was administered intravenously with calculation of the computer generated ejection fraction of radiotracer from the gallbladder. The patient did not experience pain with CCK administration. The computer generated ejection fraction of radiotracer from the gallbladder is abnormally low at 27.6%, normal greater than 38%.  IMPRESSION: Abnormally low ejection fraction of radiotracer the gallbladder. This finding is concerning for biliary dyskinesia. Cystic and common bile ducts are patent as is evidenced by visualization of gallbladder and small bowel.   Electronically Signed   By:  Lowella Grip M.D.   On: 09/23/2013 14:50      Assessment    Biliary dyskinesia     Plan    Laparoscopic cholecystectomy with intraoperative cholangiogram.  The surgical procedure has been discussed with the patient.  Potential risks, benefits, alternative treatments, and expected outcomes have been explained.  All of the patient's questions at this time have been answered.  The likelihood of reaching the patient's treatment goal is good.  The patient understand the proposed surgical procedure and wishes to proceed.         Azim Gillingham K. 10/15/2013, 10:42 AM

## 2013-10-19 ENCOUNTER — Encounter (HOSPITAL_COMMUNITY): Payer: Self-pay | Admitting: Pharmacy Technician

## 2013-10-20 ENCOUNTER — Encounter (HOSPITAL_COMMUNITY)
Admission: RE | Admit: 2013-10-20 | Discharge: 2013-10-20 | Disposition: A | Discharge status: Home / Self Care | Payer: Medicare Other | Source: Ambulatory Visit | Attending: Surgery | Admitting: Surgery

## 2013-10-20 ENCOUNTER — Other Ambulatory Visit: Payer: Self-pay

## 2013-10-20 ENCOUNTER — Encounter (HOSPITAL_COMMUNITY)
Admission: RE | Admit: 2013-10-20 | Discharge: 2013-10-20 | Disposition: A | Discharge status: Home / Self Care | Payer: Medicare Other | Source: Ambulatory Visit | Attending: Anesthesiology | Admitting: Anesthesiology

## 2013-10-20 ENCOUNTER — Encounter (HOSPITAL_COMMUNITY): Payer: Self-pay

## 2013-10-20 DIAGNOSIS — Z6839 Body mass index (BMI) 39.0-39.9, adult: Secondary | ICD-10-CM | POA: Diagnosis not present

## 2013-10-20 DIAGNOSIS — N289 Disorder of kidney and ureter, unspecified: Secondary | ICD-10-CM | POA: Diagnosis not present

## 2013-10-20 DIAGNOSIS — K811 Chronic cholecystitis: Secondary | ICD-10-CM | POA: Diagnosis not present

## 2013-10-20 DIAGNOSIS — Z79899 Other long term (current) drug therapy: Secondary | ICD-10-CM | POA: Diagnosis not present

## 2013-10-20 DIAGNOSIS — R918 Other nonspecific abnormal finding of lung field: Secondary | ICD-10-CM | POA: Diagnosis not present

## 2013-10-20 DIAGNOSIS — K828 Other specified diseases of gallbladder: Secondary | ICD-10-CM | POA: Diagnosis present

## 2013-10-20 DIAGNOSIS — F411 Generalized anxiety disorder: Secondary | ICD-10-CM | POA: Diagnosis not present

## 2013-10-20 DIAGNOSIS — Z87891 Personal history of nicotine dependence: Secondary | ICD-10-CM | POA: Diagnosis not present

## 2013-10-20 DIAGNOSIS — I10 Essential (primary) hypertension: Secondary | ICD-10-CM | POA: Diagnosis not present

## 2013-10-20 DIAGNOSIS — K219 Gastro-esophageal reflux disease without esophagitis: Secondary | ICD-10-CM | POA: Diagnosis not present

## 2013-10-20 HISTORY — DX: Pneumonia, unspecified organism: J18.9

## 2013-10-20 HISTORY — DX: Headache: R51

## 2013-10-20 LAB — COMPREHENSIVE METABOLIC PANEL
ALT: 21 U/L (ref 0–35)
AST: 29 U/L (ref 0–37)
Albumin: 3.9 g/dL (ref 3.5–5.2)
Alkaline Phosphatase: 61 U/L (ref 39–117)
Anion gap: 15 (ref 5–15)
BUN: 29 mg/dL — ABNORMAL HIGH (ref 6–23)
CO2: 24 mEq/L (ref 19–32)
Calcium: 8.7 mg/dL (ref 8.4–10.5)
Chloride: 104 mEq/L (ref 96–112)
Creatinine, Ser: 1.46 mg/dL — ABNORMAL HIGH (ref 0.50–1.10)
GFR calc Af Amer: 46 mL/min — ABNORMAL LOW (ref >90)
GFR calc non Af Amer: 39 mL/min — ABNORMAL LOW (ref >90)
Glucose, Bld: 96 mg/dL (ref 70–99)
Potassium: 4.6 mEq/L (ref 3.7–5.3)
Sodium: 143 mEq/L (ref 137–147)
Total Bilirubin: 0.2 mg/dL — ABNORMAL LOW (ref 0.3–1.2)
Total Protein: 6.9 g/dL (ref 6.0–8.3)

## 2013-10-20 LAB — CBC
HCT: 39.5 % (ref 36.0–46.0)
Hemoglobin: 12.7 g/dL (ref 12.0–15.0)
MCH: 28.7 pg (ref 26.0–34.0)
MCHC: 32.2 g/dL (ref 30.0–36.0)
MCV: 89.4 fL (ref 78.0–100.0)
Platelets: 196 10*3/uL (ref 150–400)
RBC: 4.42 MIL/uL (ref 3.87–5.11)
RDW: 16.5 % — ABNORMAL HIGH (ref 11.5–15.5)
WBC: 7.2 10*3/uL (ref 4.0–10.5)

## 2013-10-20 MED ORDER — CHLORHEXIDINE GLUCONATE 4 % EX LIQD
1.0000 "application " | Freq: Once | CUTANEOUS | Status: DC
  Filled 2013-10-20: qty 15

## 2013-10-20 MED ORDER — CEFAZOLIN SODIUM-DEXTROSE 2-3 GM-% IV SOLR
2.0000 g | INTRAVENOUS | Status: AC
  Administered 2013-10-21: 2 g via INTRAVENOUS
  Filled 2013-10-20: qty 50

## 2013-10-20 NOTE — Pre-Procedure Instructions (Signed)
MONALISA BAYLESS  10/20/2013   Your procedure is scheduled on:  Thursday, July 30th  Report to [facility] Admitting at 530 AM.  Call this number if you have problems the morning of surgery: [phone]   Remember:   Do not eat food or drink liquids after midnight.   Take these medicines the morning of surgery with A SIP OF WATER: lopressor, protonix, klonopin if needed, perococet if needed   Do not wear jewelry, make-up or nail polish.  Do not wear lotions, powders, or perfumes. You may wear deodorant.  Do not shave 48 hours prior to surgery. Men may shave face and neck.  Do not bring valuables to the hospital.  [facility] is not responsible   for any belongings or valuables.               Contacts, dentures or bridgework may not be worn into surgery.  Leave suitcase in the car. After surgery it may be brought to your room.  For patients admitted to the hospital, discharge time is determined by your  treatment team.               Patients discharged the day of surgery will not be allowed to drive home.  Please read over the following fact sheets that you were given: Pain Booklet, Coughing and Deep Breathing and Surgical Site Infection Prevention [place] - Preparing for Surgery  Before surgery, you can play an important role.  Because skin is not sterile, your skin needs to be as free of germs as possible.  You can reduce the number of germs on you skin by washing with CHG (chlorahexidine gluconate) soap before surgery.  CHG is an antiseptic cleaner which kills germs and bonds with the skin to continue killing germs even after washing.  Please DO NOT use if you have an allergy to CHG or antibacterial soaps.  If your skin becomes reddened/irritated stop using the CHG and inform your nurse when you arrive at Short Stay.  Do not shave (including legs and underarms) for at least 48 hours prior to the first CHG shower.  You may shave your face.  Please follow these  instructions carefully:   1.  Shower with CHG Soap the night before surgery and the morning of Surgery.  2.  If you choose to wash your hair, wash your hair first as usual with your normal shampoo.  3.  After you shampoo, rinse your hair and body thoroughly to remove the shampoo.  4.  Use CHG as you would any other liquid soap.  You can apply CHG directly to the skin and wash gently with scrungie or a clean washcloth.  5.  Apply the CHG Soap to your body ONLY FROM THE NECK DOWN.  Do not use on open wounds or open sores.  Avoid contact with your eyes, ears, mouth and genitals (private parts).  Wash genitals (private parts) with your normal soap.  6.  Wash thoroughly, paying special attention to the area where your surgery will be performed.  7.  Thoroughly rinse your body with warm water from the neck down.  8.  DO NOT shower/wash with your normal soap after using and rinsing off the CHG Soap.  9.  Pat yourself dry with a clean towel.            10.  Wear clean pajamas.            11.  Place clean sheets on  your bed the night of your first shower and do not sleep with pets.  Day of Surgery  Do not apply any lotions/deoderants the morning of surgery.  Please wear clean clothes to the hospital/surgery center.

## 2013-10-20 NOTE — Progress Notes (Signed)
Primary - dr. b Saw [place] in 2013 for chest tightness - but did not have to followup

## 2013-10-20 NOTE — Progress Notes (Signed)
10/20/13 0823  OBSTRUCTIVE SLEEP APNEA  Have you ever been diagnosed with sleep apnea through a sleep study? No  Do you snore loudly (loud enough to be heard through closed doors)?  1  Do you often feel tired, fatigued, or sleepy during the daytime? 1  Has anyone observed you stop breathing during your sleep? 1  Do you have, or are you being treated for high blood pressure? 1  BMI more than 35 kg/m2? 0  Age over 55 years old? 1  Neck circumference greater than 40 cm/16 inches? 0  Gender: 0  Obstructive Sleep Apnea Score 5  Score 4 or greater  Results sent to PCP

## 2013-10-21 ENCOUNTER — Ambulatory Visit (HOSPITAL_COMMUNITY): Payer: Medicare Other

## 2013-10-21 ENCOUNTER — Encounter (HOSPITAL_COMMUNITY): Payer: Medicare Other | Admitting: Certified Registered"

## 2013-10-21 ENCOUNTER — Ambulatory Visit (HOSPITAL_COMMUNITY)
Admission: RE | Admit: 2013-10-21 | Discharge: 2013-10-21 | Disposition: A | Discharge status: Home / Self Care | Payer: Medicare Other | Source: Ambulatory Visit | Attending: Surgery | Admitting: Surgery

## 2013-10-21 ENCOUNTER — Encounter (HOSPITAL_COMMUNITY): Payer: Self-pay | Admitting: *Deleted

## 2013-10-21 ENCOUNTER — Ambulatory Visit (HOSPITAL_COMMUNITY): Payer: Medicare Other | Admitting: Certified Registered"

## 2013-10-21 ENCOUNTER — Encounter (HOSPITAL_COMMUNITY)
Admission: RE | Disposition: A | Discharge status: Home / Self Care | Payer: Self-pay | Source: Ambulatory Visit | Attending: Surgery

## 2013-10-21 DIAGNOSIS — N289 Disorder of kidney and ureter, unspecified: Secondary | ICD-10-CM | POA: Insufficient documentation

## 2013-10-21 DIAGNOSIS — K828 Other specified diseases of gallbladder: Secondary | ICD-10-CM | POA: Diagnosis not present

## 2013-10-21 DIAGNOSIS — Z79899 Other long term (current) drug therapy: Secondary | ICD-10-CM | POA: Insufficient documentation

## 2013-10-21 DIAGNOSIS — I10 Essential (primary) hypertension: Secondary | ICD-10-CM | POA: Diagnosis not present

## 2013-10-21 DIAGNOSIS — K219 Gastro-esophageal reflux disease without esophagitis: Secondary | ICD-10-CM | POA: Insufficient documentation

## 2013-10-21 DIAGNOSIS — F411 Generalized anxiety disorder: Secondary | ICD-10-CM | POA: Insufficient documentation

## 2013-10-21 DIAGNOSIS — Z6839 Body mass index (BMI) 39.0-39.9, adult: Secondary | ICD-10-CM | POA: Insufficient documentation

## 2013-10-21 DIAGNOSIS — Z87891 Personal history of nicotine dependence: Secondary | ICD-10-CM | POA: Insufficient documentation

## 2013-10-21 DIAGNOSIS — M1990 Unspecified osteoarthritis, unspecified site: Secondary | ICD-10-CM | POA: Diagnosis not present

## 2013-10-21 DIAGNOSIS — K811 Chronic cholecystitis: Secondary | ICD-10-CM | POA: Insufficient documentation

## 2013-10-21 DIAGNOSIS — K8020 Calculus of gallbladder without cholecystitis without obstruction: Secondary | ICD-10-CM | POA: Diagnosis not present

## 2013-10-21 DIAGNOSIS — K2100 Gastro-esophageal reflux disease with esophagitis, without bleeding: Secondary | ICD-10-CM | POA: Diagnosis not present

## 2013-10-21 HISTORY — PX: CHOLECYSTECTOMY: SHX55

## 2013-10-21 SURGERY — LAPAROSCOPIC CHOLECYSTECTOMY WITH INTRAOPERATIVE CHOLANGIOGRAM
Anesthesia: General | Site: Abdomen

## 2013-10-21 MED ORDER — SUCCINYLCHOLINE CHLORIDE 20 MG/ML IJ SOLN
INTRAMUSCULAR | Status: DC | PRN
  Administered 2013-10-21: 100 mg via INTRAVENOUS

## 2013-10-21 MED ORDER — OXYCODONE HCL 5 MG/5ML PO SOLN: 5.0000 mg | Freq: Once | ORAL | Status: AC | PRN

## 2013-10-21 MED ORDER — PROMETHAZINE HCL 25 MG/ML IJ SOLN
INTRAMUSCULAR | Status: AC
  Filled 2013-10-21: qty 1

## 2013-10-21 MED ORDER — ESMOLOL HCL 10 MG/ML IV SOLN
INTRAVENOUS | Status: DC | PRN
  Administered 2013-10-21: 30 mg via INTRAVENOUS
  Administered 2013-10-21: 10 mg via INTRAVENOUS

## 2013-10-21 MED ORDER — ARTIFICIAL TEARS OP OINT
TOPICAL_OINTMENT | OPHTHALMIC | Status: DC | PRN
  Administered 2013-10-21: 1 via OPHTHALMIC

## 2013-10-21 MED ORDER — SODIUM CHLORIDE 0.9 % IV SOLN: 12.5000 mg | INTRAVENOUS | Status: DC | PRN

## 2013-10-21 MED ORDER — MORPHINE SULFATE 2 MG/ML IJ SOLN
2.0000 mg | INTRAMUSCULAR | Status: DC | PRN
Start: 2013-10-21 — End: 2013-10-21

## 2013-10-21 MED ORDER — OXYCODONE HCL 5 MG PO TABS
5.0000 mg | ORAL_TABLET | Freq: Once | ORAL | Status: AC | PRN
  Administered 2013-10-21: 5 mg via ORAL

## 2013-10-21 MED ORDER — IOHEXOL 300 MG/ML  SOLN
INTRAMUSCULAR | Status: DC | PRN
  Administered 2013-10-21: 08:00:00

## 2013-10-21 MED ORDER — ONDANSETRON HCL 4 MG/2ML IJ SOLN: 4.0000 mg | Freq: Once | INTRAMUSCULAR | Status: DC | PRN

## 2013-10-21 MED ORDER — BUPIVACAINE-EPINEPHRINE 0.25% -1:200000 IJ SOLN
INTRAMUSCULAR | Status: DC | PRN
  Administered 2013-10-21: 16 mL

## 2013-10-21 MED ORDER — LIDOCAINE HCL (CARDIAC) 20 MG/ML IV SOLN
INTRAVENOUS | Status: AC
  Filled 2013-10-21: qty 5

## 2013-10-21 MED ORDER — HYDROMORPHONE HCL PF 1 MG/ML IJ SOLN
0.5000 mg | Freq: Once | INTRAMUSCULAR | Status: AC
  Administered 2013-10-21 (×2): 0.5 mg via INTRAVENOUS

## 2013-10-21 MED ORDER — FENTANYL CITRATE 0.05 MG/ML IJ SOLN
INTRAMUSCULAR | Status: AC
  Filled 2013-10-21: qty 5

## 2013-10-21 MED ORDER — SODIUM CHLORIDE 0.9 % IR SOLN
Status: DC | PRN
  Administered 2013-10-21: 1000 mL

## 2013-10-21 MED ORDER — HYDROMORPHONE HCL PF 1 MG/ML IJ SOLN
INTRAMUSCULAR | Status: AC
  Filled 2013-10-21: qty 1

## 2013-10-21 MED ORDER — ARTIFICIAL TEARS OP OINT
TOPICAL_OINTMENT | OPHTHALMIC | Status: AC
  Filled 2013-10-21: qty 3.5

## 2013-10-21 MED ORDER — ONDANSETRON HCL 4 MG/2ML IJ SOLN
INTRAMUSCULAR | Status: AC
  Filled 2013-10-21: qty 2

## 2013-10-21 MED ORDER — MIDAZOLAM HCL 5 MG/5ML IJ SOLN
INTRAMUSCULAR | Status: DC | PRN
  Administered 2013-10-21: 2 mg via INTRAVENOUS

## 2013-10-21 MED ORDER — PROMETHAZINE HCL 25 MG RE SUPP: 25.0000 mg | Freq: Four times a day (QID) | RECTAL | Status: DC | PRN

## 2013-10-21 MED ORDER — OXYCODONE-ACETAMINOPHEN 5-325 MG PO TABS: 1.0000 | ORAL_TABLET | ORAL | Status: DC | PRN

## 2013-10-21 MED ORDER — GLYCOPYRROLATE 0.2 MG/ML IJ SOLN
INTRAMUSCULAR | Status: AC
  Filled 2013-10-21: qty 3

## 2013-10-21 MED ORDER — BUPIVACAINE-EPINEPHRINE (PF) 0.25% -1:200000 IJ SOLN
INTRAMUSCULAR | Status: AC
  Filled 2013-10-21: qty 30

## 2013-10-21 MED ORDER — ROCURONIUM BROMIDE 50 MG/5ML IV SOLN
INTRAVENOUS | Status: AC
  Filled 2013-10-21: qty 1

## 2013-10-21 MED ORDER — LACTATED RINGERS IV SOLN
INTRAVENOUS | Status: DC | PRN
  Administered 2013-10-21: 07:00:00 via INTRAVENOUS

## 2013-10-21 MED ORDER — SCOPOLAMINE 1 MG/3DAYS TD PT72
MEDICATED_PATCH | TRANSDERMAL | Status: AC
  Filled 2013-10-21: qty 1

## 2013-10-21 MED ORDER — OXYCODONE HCL 5 MG PO TABS
ORAL_TABLET | ORAL | Status: AC
  Filled 2013-10-21: qty 1

## 2013-10-21 MED ORDER — NEOSTIGMINE METHYLSULFATE 10 MG/10ML IV SOLN
INTRAVENOUS | Status: AC
  Filled 2013-10-21: qty 1

## 2013-10-21 MED ORDER — PROMETHAZINE HCL 25 MG/ML IJ SOLN
6.2500 mg | Freq: Once | INTRAMUSCULAR | Status: AC
  Administered 2013-10-21: 6.25 mg via INTRAVENOUS

## 2013-10-21 MED ORDER — ROCURONIUM BROMIDE 100 MG/10ML IV SOLN
INTRAVENOUS | Status: DC | PRN
  Administered 2013-10-21: 25 mg via INTRAVENOUS

## 2013-10-21 MED ORDER — ESMOLOL HCL 10 MG/ML IV SOLN
INTRAVENOUS | Status: AC
  Filled 2013-10-21: qty 10

## 2013-10-21 MED ORDER — 0.9 % SODIUM CHLORIDE (POUR BTL) OPTIME
TOPICAL | Status: DC | PRN
  Administered 2013-10-21: 1000 mL

## 2013-10-21 MED ORDER — GLYCOPYRROLATE 0.2 MG/ML IJ SOLN
INTRAMUSCULAR | Status: DC | PRN
  Administered 2013-10-21: 0.6 mg via INTRAVENOUS

## 2013-10-21 MED ORDER — SCOPOLAMINE 1 MG/3DAYS TD PT72
1.0000 | MEDICATED_PATCH | Freq: Once | TRANSDERMAL | Status: AC
  Administered 2013-10-21: 1 via TRANSDERMAL

## 2013-10-21 MED ORDER — LIDOCAINE HCL (CARDIAC) 20 MG/ML IV SOLN
INTRAVENOUS | Status: DC | PRN
  Administered 2013-10-21: 60 mg via INTRAVENOUS

## 2013-10-21 MED ORDER — PROPOFOL 10 MG/ML IV BOLUS
INTRAVENOUS | Status: AC
  Filled 2013-10-21: qty 20

## 2013-10-21 MED ORDER — HYDROMORPHONE HCL PF 1 MG/ML IJ SOLN
0.2500 mg | INTRAMUSCULAR | Status: DC | PRN
Start: 2013-10-21 — End: 2013-10-21
  Administered 2013-10-21 (×4): 0.5 mg via INTRAVENOUS

## 2013-10-21 MED ORDER — FENTANYL CITRATE 0.05 MG/ML IJ SOLN
INTRAMUSCULAR | Status: DC | PRN
  Administered 2013-10-21: 50 ug via INTRAVENOUS
  Administered 2013-10-21 (×2): 100 ug via INTRAVENOUS

## 2013-10-21 MED ORDER — PROPOFOL 10 MG/ML IV BOLUS
INTRAVENOUS | Status: DC | PRN
  Administered 2013-10-21: 200 mg via INTRAVENOUS

## 2013-10-21 MED ORDER — SUCCINYLCHOLINE CHLORIDE 20 MG/ML IJ SOLN
INTRAMUSCULAR | Status: AC
  Filled 2013-10-21: qty 1

## 2013-10-21 MED ORDER — MIDAZOLAM HCL 2 MG/2ML IJ SOLN
INTRAMUSCULAR | Status: AC
  Filled 2013-10-21: qty 2

## 2013-10-21 MED ORDER — NEOSTIGMINE METHYLSULFATE 10 MG/10ML IV SOLN
INTRAVENOUS | Status: DC | PRN
  Administered 2013-10-21: 4 mg via INTRAVENOUS

## 2013-10-21 SURGICAL SUPPLY — 47 items
APL SKNCLS STERI-STRIP NONHPOA (GAUZE/BANDAGES/DRESSINGS) ×1
APPLIER CLIP ROT 10 11.4 M/L (STAPLE) ×3
APR CLP MED LRG 11.4X10 (STAPLE) ×1
BAG SPEC RTRVL LRG 6X4 10 (ENDOMECHANICALS) ×1
BENZOIN TINCTURE PRP APPL 2/3 (GAUZE/BANDAGES/DRESSINGS) ×3 IMPLANT
BLADE SURG ROTATE 9660 (MISCELLANEOUS) IMPLANT
CANISTER SUCTION 2500CC (MISCELLANEOUS) ×3 IMPLANT
CHLORAPREP W/TINT 26ML (MISCELLANEOUS) ×5 IMPLANT
CLIP APPLIE ROT 10 11.4 M/L (STAPLE) ×1 IMPLANT
COVER MAYO STAND STRL (DRAPES) ×3 IMPLANT
COVER SURGICAL LIGHT HANDLE (MISCELLANEOUS) ×3 IMPLANT
DRAPE C-ARM 42X72 X-RAY (DRAPES) ×3 IMPLANT
DRAPE UTILITY 15X26 W/TAPE STR (DRAPE) ×6 IMPLANT
DRSG TEGADERM 2-3/8X2-3/4 SM (GAUZE/BANDAGES/DRESSINGS) ×9 IMPLANT
DRSG TEGADERM 4X4.75 (GAUZE/BANDAGES/DRESSINGS) ×3 IMPLANT
ELECT REM PT RETURN 9FT ADLT (ELECTROSURGICAL) ×3
ELECTRODE REM PT RTRN 9FT ADLT (ELECTROSURGICAL) ×1 IMPLANT
FILTER SMOKE EVAC LAPAROSHD (FILTER) ×3 IMPLANT
GAUZE SPONGE 2X2 8PLY STRL LF (GAUZE/BANDAGES/DRESSINGS) ×1 IMPLANT
GLOVE BIO SURGEON STRL SZ7 (GLOVE) ×3 IMPLANT
GLOVE BIO SURGEON STRL SZ7.5 (GLOVE) ×2 IMPLANT
GLOVE BIOGEL PI IND STRL 6.5 (GLOVE) IMPLANT
GLOVE BIOGEL PI IND STRL 7.5 (GLOVE) ×1 IMPLANT
GLOVE BIOGEL PI INDICATOR 6.5 (GLOVE) ×4
GLOVE BIOGEL PI INDICATOR 7.5 (GLOVE) ×4
GLOVE SURG SS PI 6.5 STRL IVOR (GLOVE) ×2 IMPLANT
GOWN STRL REUS W/ TWL LRG LVL3 (GOWN DISPOSABLE) ×4 IMPLANT
GOWN STRL REUS W/TWL LRG LVL3 (GOWN DISPOSABLE) ×9
KIT BASIN OR (CUSTOM PROCEDURE TRAY) ×3 IMPLANT
KIT ROOM TURNOVER OR (KITS) ×3 IMPLANT
NS IRRIG 1000ML POUR BTL (IV SOLUTION) ×3 IMPLANT
PAD ARMBOARD 7.5X6 YLW CONV (MISCELLANEOUS) ×3 IMPLANT
POUCH SPECIMEN RETRIEVAL 10MM (ENDOMECHANICALS) ×3 IMPLANT
SCISSORS LAP 5X35 DISP (ENDOMECHANICALS) ×3 IMPLANT
SET CHOLANGIOGRAPH 5 50 .035 (SET/KITS/TRAYS/PACK) ×3 IMPLANT
SET IRRIG TUBING LAPAROSCOPIC (IRRIGATION / IRRIGATOR) ×3 IMPLANT
SLEEVE ENDOPATH XCEL 5M (ENDOMECHANICALS) ×3 IMPLANT
SPECIMEN JAR SMALL (MISCELLANEOUS) ×3 IMPLANT
SPONGE GAUZE 2X2 STER 10/PKG (GAUZE/BANDAGES/DRESSINGS) ×2
SUT MNCRL AB 4-0 PS2 18 (SUTURE) ×3 IMPLANT
TOWEL OR 17X24 6PK STRL BLUE (TOWEL DISPOSABLE) ×3 IMPLANT
TOWEL OR 17X26 10 PK STRL BLUE (TOWEL DISPOSABLE) ×3 IMPLANT
TRAY LAPAROSCOPIC (CUSTOM PROCEDURE TRAY) ×3 IMPLANT
TROCAR XCEL BLUNT TIP 100MML (ENDOMECHANICALS) ×3 IMPLANT
TROCAR XCEL NON-BLD 11X100MML (ENDOMECHANICALS) ×3 IMPLANT
TROCAR XCEL NON-BLD 5MMX100MML (ENDOMECHANICALS) ×3 IMPLANT
TUBING INSUFFLATION (TUBING) ×2 IMPLANT

## 2013-10-21 NOTE — Progress Notes (Signed)
Received from PACU via stretcher.  Awake, A&O.  Abdominal dressings CDI.  States pain is 6/10 but wants no pain meds at this time.  Assisted to bathroom and pt voided without difficulty.  Ambulated to chair and po's offered.  Declined at this time due to nausea.  Call bell in reach and family present. Will continue to monitor.

## 2013-10-21 NOTE — Discharge Instructions (Signed)
[facility] °LAPAROSCOPIC SURGERY: POST OP INSTRUCTIONS °Always review your discharge instruction sheet given to you by the facility where your surgery was performed. °IF YOU HAVE DISABILITY OR FAMILY LEAVE FORMS, YOU MUST BRING THEM TO THE OFFICE FOR PROCESSING.   °DO NOT GIVE THEM TO YOUR DOCTOR. ° °1. A prescription for pain medication will be given to you upon discharge.  Take your pain medication as prescribed, if needed.  If narcotic pain medicine is not needed, then you may take acetaminophen (Tylenol) or ibuprofen (Advil) as needed. °2. Take your usually prescribed medications unless otherwise directed. °3. If you need a refill on your pain medication, please contact your pharmacy.  They will contact our office to request authorization. Prescriptions will not be filled after 5pm or on week-ends. °4. You should follow a light diet the first few days after arrival home, such as soup and crackers, etc.  Be sure to include lots of fluids daily. °5. Most patients will experience some swelling and bruising in the area of the incisions.  Ice packs will help.  Swelling and bruising can take several days to resolve.  °6. It is common to experience some constipation if taking pain medication after surgery.  Increasing fluid intake and taking a stool softener (such as Colace) will usually help or prevent this problem from occurring.  A mild laxative (Milk of Magnesia or Miralax) should be taken according to package instructions if there are no bowel movements after 48 hours. °7. Unless discharge instructions indicate otherwise, you may remove your bandages 48 hours after surgery, and you may shower at that time.  You will have steri-strips (small skin tapes) in place directly over the incision.  These strips should be left on the skin for 7-10 days.  If your surgeon used skin glue on the incision, you may shower in 24 hours.  The glue will flake off over the next 2-3 weeks.  Any sutures or staples  will be removed at the office during your follow-up visit. °8. ACTIVITIES:  You may resume regular (light) daily activities beginning the next day--such as daily self-care, walking, climbing stairs--gradually increasing activities as tolerated.  You may have sexual intercourse when it is comfortable.  Refrain from any heavy lifting or straining until approved by your doctor. °a. You may drive when you are no longer taking prescription pain medication, you can comfortably wear a seatbelt, and you can safely maneuver your car and apply brakes. °b. RETURN TO WORK:   2-3 weeks °9. You should see your doctor in the office for a follow-up appointment approximately 2-3 weeks after your surgery.  Make sure that you call for this appointment within a day or two after you arrive home to insure a convenient appointment time. °10. OTHER INSTRUCTIONS: ________________________________________________________________________ °WHEN TO CALL YOUR DOCTOR: °1. Fever over 101.0 °2. Inability to urinate °3. Continued bleeding from incision. °4. Increased pain, redness, or drainage from the incision. °5. Increasing abdominal pain ° °The clinic staff is available to answer your questions during regular business hours.  Please don’t hesitate to call and ask to speak to one of the nurses for clinical concerns.  If you have a medical emergency, go to the nearest emergency room or call 911.  A surgeon from [facility] is always on call at the hospital. °[address], [address], [place], [place]  [address] ? [address] [address], [place], [place]   [address] °[phone] ? [phone] ? FAX [phone] °Web site:   [email or web address]  General Anesthesia, Adult, Care After  Refer to this sheet in the next few weeks. These instructions provide you with information on caring for yourself after your procedure. Your health care provider may also give you more specific instructions. Your treatment has been planned  according to current medical practices, but problems sometimes occur. Call your health care provider if you have any problems or questions after your procedure.  WHAT TO EXPECT AFTER THE PROCEDURE  After the procedure, it is typical to experience:  Sleepiness.  Nausea and vomiting. HOME CARE INSTRUCTIONS  For the first 24 hours after general anesthesia:  Have a responsible person with you.  Do not drive a car. If you are alone, do not take public transportation.  Do not drink alcohol.  Do not take medicine that has not been prescribed by your health care provider.  Do not sign important papers or make important decisions.  You may resume a normal diet and activities as directed by your health care provider.  Change bandages (dressings) as directed.  If you have questions or problems that seem related to general anesthesia, call the hospital and ask for the anesthetist or anesthesiologist on call. SEEK MEDICAL CARE IF:  You have nausea and vomiting that continue the day after anesthesia.  You develop a rash. SEEK IMMEDIATE MEDICAL CARE IF:  You have difficulty breathing.  You have chest pain.  You have any allergic problems. Document Released: 06/17/2000 Document Revised: 11/11/2012 Document Reviewed: 09/24/2012  [facility] Patient Information 2014 [place], [place].

## 2013-10-21 NOTE — Anesthesia Procedure Notes (Signed)
Procedure Name: Intubation Date/Time: 10/21/2013 7:37 AM Performed by: Barrington Ellison Pre-anesthesia Checklist: Patient identified, Emergency Drugs available, Suction available, Patient being monitored and Timeout performed Patient Re-evaluated:Patient Re-evaluated prior to inductionOxygen Delivery Method: Circle system utilized Preoxygenation: Pre-oxygenation with 100% oxygen Intubation Type: IV induction, Rapid sequence and Cricoid Pressure applied Laryngoscope Size: Mac and 3 Grade View: Grade I Tube type: Oral Tube size: 7.0 mm Number of attempts: 1 Airway Equipment and Method: Stylet Placement Confirmation: ETT inserted through vocal cords under direct vision,  positive ETCO2 and breath sounds checked- equal and bilateral Secured at: 22 cm Tube secured with: Tape Dental Injury: Teeth and Oropharynx as per pre-operative assessment

## 2013-10-21 NOTE — Op Note (Signed)
Laparoscopic Cholecystectomy with IOC Procedure Note  Indications: This patient presents with symptomatic gallbladder disease and will undergo laparoscopic cholecystectomy.  Pre-operative Diagnosis: Biliary dyskinesia   Post-operative Diagnosis: Same  Surgeon: Jarry Manon K.   Assistants: Sharyn Dross, RNFA  Anesthesia: General endotracheal anesthesia  ASA Class: 2  Procedure Details  The patient was seen again in the Holding Room. The risks, benefits, complications, treatment options, and expected outcomes were discussed with the patient. The possibilities of reaction to medication, pulmonary aspiration, perforation of viscus, bleeding, recurrent infection, finding a normal gallbladder, the need for additional procedures, failure to diagnose a condition, the possible need to convert to an open procedure, and creating a complication requiring transfusion or operation were discussed with the patient. The likelihood of improving the patient's symptoms with return to their baseline status is good.  The patient and/or family concurred with the proposed plan, giving informed consent. The site of surgery properly noted. The patient was taken to Operating Room, identified as Shannon Stuart and the procedure verified as Laparoscopic Cholecystectomy with Intraoperative Cholangiogram. A Time Out was held and the above information confirmed.  Prior to the induction of general anesthesia, antibiotic prophylaxis was administered. General endotracheal anesthesia was then administered and tolerated well. After the induction, the abdomen was prepped with Chloraprep and draped in the sterile fashion. The patient was positioned in the supine position.  Local anesthetic agent was injected into the skin near the umbilicus and an incision made. We dissected down to the abdominal fascia with blunt dissection.  The fascia was incised vertically and we entered the peritoneal cavity bluntly.  A pursestring suture of  0-Vicryl was placed around the fascial opening.  The Hasson cannula was inserted and secured with the stay suture.  Pneumoperitoneum was then created with CO2 and tolerated well without any adverse changes in the patient's vital signs. An 11-mm port was placed in the subxiphoid position.  Two 5-mm ports were placed in the right upper quadrant. All skin incisions were infiltrated with a local anesthetic agent before making the incision and placing the trocars.   We positioned the patient in reverse Trendelenburg, tilted slightly to the patient's left.  The gallbladder was identified, the fundus grasped and retracted cephalad. Minimal adhesions were lysed bluntly and with the electrocautery where indicated, taking care not to injure any adjacent organs or viscus. The infundibulum was grasped and retracted laterally, exposing the peritoneum overlying the triangle of Calot. This was then divided and exposed in a blunt fashion. A critical view of the cystic duct and cystic artery was obtained.  The cystic duct was clearly identified and bluntly dissected circumferentially. The cystic duct was ligated with a clip distally.   An incision was made in the cystic duct and the [facility] cholangiogram catheter introduced. The catheter was secured using a clip. A cholangiogram was then obtained which showed good visualization of the distal and proximal biliary tree with no sign of filling defects or obstruction.  Contrast flowed easily into the duodenum. The catheter was then removed.   The cystic duct was then ligated with clips and divided. The cystic artery was identified, dissected free, ligated with clips and divided as well.   The gallbladder was dissected from the liver bed in retrograde fashion with the electrocautery. The gallbladder was removed and placed in an Endocatch sac. The liver bed was irrigated and inspected. Hemostasis was achieved with the electrocautery. Copious irrigation was utilized and was repeatedly  aspirated until clear.  The gallbladder and  Endocatch sac were then removed through the umbilical port site.  The pursestring suture was used to close the umbilical fascia.    We again inspected the right upper quadrant for hemostasis.  Pneumoperitoneum was released as we removed the trocars.  4-0 Monocryl was used to close the skin.   Benzoin, steri-strips, and clean dressings were applied. The patient was then extubated and brought to the recovery room in stable condition. Instrument, sponge, and needle counts were correct at closure and at the conclusion of the case.   Findings: Cholecystitis without Cholelithiasis  Estimated Blood Loss: Minimal         Drains: none         Specimens: Gallbladder           Complications: None; patient tolerated the procedure well.         Disposition: PACU - hemodynamically stable.         Condition: stable  Shannon Stuart. Georgette Dover, MD, [facility]  General/ Trauma Surgery  10/21/2013 8:36 AM

## 2013-10-21 NOTE — Anesthesia Preprocedure Evaluation (Addendum)
Anesthesia Evaluation  Patient identified by MRN, date of birth, ID band Patient awake    Reviewed: Allergy & Precautions, H&P , NPO status , Patient's Chart, lab work & pertinent test results, reviewed documented beta blocker date and time   History of Anesthesia Complications (+) PONV and history of anesthetic complications  Airway Mallampati: II TM Distance: >3 FB Neck ROM: Full    Dental  (+) Teeth Intact, Dental Advisory Given   Pulmonary pneumonia -, Current Smoker,  breath sounds clear to auscultation        Cardiovascular hypertension, Pt. on home beta blockers Rhythm:Regular Rate:Normal  '13 ECHo: EF 58-52%, grade 2 diastolic dysfunction, valves OK '13 stress test: normal perfusion, no ischemia, EF 60%   Neuro/Psych  Headaches, Anxiety Depression    GI/Hepatic Neg liver ROS, GERD-  Medicated and Poorly Controlled,(+)     substance abuse  marijuana use, Daily emesis with gallbladder   Endo/Other  Morbid obesity  Renal/GU Renal InsufficiencyRenal disease (creat 1.46)     Musculoskeletal   Abdominal (+) + obese,   Peds  Hematology negative hematology ROS (+)   Anesthesia Other Findings   Reproductive/Obstetrics                      Anesthesia Physical Anesthesia Plan  ASA: III  Anesthesia Plan: General   Post-op Pain Management:    Induction: Intravenous  Airway Management Planned: Oral ETT  Additional Equipment:   Intra-op Plan:   Post-operative Plan: Extubation in OR  Informed Consent: I have reviewed the patients History and Physical, chart, labs and discussed the procedure including the risks, benefits and alternatives for the proposed anesthesia with the patient or authorized representative who has indicated his/her understanding and acceptance.   Dental advisory given  Plan Discussed with: CRNA, Anesthesiologist and Surgeon  Anesthesia Plan Comments: (Plan routine  monitors, GETA)       Anesthesia Quick Evaluation

## 2013-10-21 NOTE — Progress Notes (Signed)
Ginger ale given and tolerated.

## 2013-10-21 NOTE — Progress Notes (Signed)
D/C instructions reviewed with pt and family member.  Understanding voiced.  Prescriptions given.  Ready for D/C.

## 2013-10-21 NOTE — Anesthesia Postprocedure Evaluation (Signed)
  Anesthesia Post-op Note  Patient: Shannon Stuart  Procedure(s) Performed: Procedure(s): LAPAROSCOPIC CHOLECYSTECTOMY WITH INTRAOPERATIVE CHOLANGIOGRAM (N/A)  Patient Location: PACU  Anesthesia Type: General   Level of Consciousness: awake, alert  and oriented  Airway and Oxygen Therapy: Patient Spontanous Breathing  Post-op Pain: moderate  Post-op Assessment: Post-op Vital signs reviewed  Post-op Vital Signs: Reviewed  Last Vitals:  Filed Vitals:   10/21/13 1040  BP:   Pulse: 74  Temp:   Resp: 12    Complications: No apparent anesthesia complications

## 2013-10-21 NOTE — Interval H&P Note (Signed)
History and Physical Interval Note:  10/21/2013 7:17 AM  Shannon Stuart  has presented today for surgery, with the diagnosis of Biliary dyskinesia  The various methods of treatment have been discussed with the patient and family. After consideration of risks, benefits and other options for treatment, the patient has consented to  Procedure(s): LAPAROSCOPIC CHOLECYSTECTOMY WITH INTRAOPERATIVE CHOLANGIOGRAM (N/A) as a surgical intervention .  The patient's history has been reviewed, patient examined, no change in status, stable for surgery.  I have reviewed the patient's chart and labs.  Questions were answered to the patient's satisfaction.     [name] K.

## 2013-10-21 NOTE — Transfer of Care (Signed)
Immediate Anesthesia Transfer of Care Note  Patient: Shannon Stuart  Procedure(s) Performed: Procedure(s): LAPAROSCOPIC CHOLECYSTECTOMY WITH INTRAOPERATIVE CHOLANGIOGRAM (N/A)  Patient Location: PACU  Anesthesia Type:General  Level of Consciousness: awake, alert  and oriented  Airway & Oxygen Therapy: Patient Spontanous Breathing and Patient connected to nasal cannula oxygen  Post-op Assessment: Report given to PACU RN and Patient moving all extremities X 4  Post vital signs: Reviewed and stable  Complications: No apparent anesthesia complications

## 2013-10-21 NOTE — H&P (View-Only) (Signed)
Patient ID: Shannon Stuart, female   DOB: 08-Aug-1960, 53 y.o.   MRN: [id number]  Chief Complaint  Patient presents with  . eval gallbladder    HPI Shannon Stuart is a 53 y.o. female.   Referred by Dr. Oretha Caprice for evaluation of biliary dyskinesia HPI  This is a 53 year old female who presents with several months of persistent, worsening epigastric abdominal pain with radiation through to her back. This is associated with nausea, vomiting, diarrhea, and abdominal bloating. She has tried to alter her diet but there's been minimal improvement. A previous CT scan showed no sign of cholelithiasis. A HIDA scan showed a decreased gallbladder ejection fraction at 27 with exacerbation of her symptoms. Colonoscopy was unremarkable. She presents now to discuss elective cholecystectomy. 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    Past Medical History  Diagnosis Date  . Allergy   . Anxiety   . Depression   . Hyperlipidemia     pt denies 07/20/13  . Hypertension   . Endometriosis   . GERD (gastroesophageal reflux disease)   . Obesity, Class II, BMI 35-39.9     Past Surgical History  Procedure Laterality Date  . Brain tumor excision    . Lumbar disc surgery  04/1986, 12/1986    x 2  . Tissue graft      Family History  Problem Relation Age of Onset  . Diabetes Maternal Grandmother   . Heart failure Maternal Grandmother     CHF  . Diabetes Sister   . Diabetes Sister     gestational  . Heart disease Mother 23    MI  . Hypertension Other     entire family  . Colon cancer Neg Hx   . Esophageal cancer Neg Hx   . Stomach cancer Neg Hx     Social History History  Substance Use Topics  . Smoking status: Former Smoker    Types: Cigarettes    Quit date: 03/26/1991  . Smokeless tobacco: Never Used  . Alcohol Use: Yes     Comment: 'SOCIALLY"    Allergies  Allergen Reactions  . Zofran [Ondansetron Hcl] Nausea And Vomiting    Current Outpatient Prescriptions   Medication Sig Dispense Refill  . CHROMIUM-CINNAMON PO Take 1 tablet by mouth.      . clonazePAM (KLONOPIN) 1 MG tablet Take 1 tablet (1 mg total) by mouth 3 (three) times daily as needed for anxiety.  90 tablet  1  . cyclobenzaprine (FLEXERIL) 10 MG tablet TAKE 1 TABLET THREE TIMES A DAY AS NEEDED FOR MUSCLE SPASMS & BACK PAIN  90 tablet  1  . Ginkgo Biloba (GINKOBA PO) Take 1 tablet by mouth.      Marland Kitchen ibuprofen (ADVIL,MOTRIN) 800 MG tablet Take 1 tablet (800 mg total) by mouth every 8 (eight) hours as needed for moderate pain.  90 tablet  4  . lisinopril-hydrochlorothiazide (PRINZIDE,ZESTORETIC) 20-25 MG per tablet Take 1 tablet by mouth daily.  90 tablet  11  . medroxyPROGESTERone (DEPO-PROVERA) 150 MG/ML injection Inject 1 mL (150 mg total) into the muscle every 3 (three) months.  1 mL  0  . metoprolol tartrate (LOPRESSOR) 25 MG tablet Take 1 tablet (25 mg total) by mouth 2 (two) times daily.  180 tablet  4  . mometasone (ELOCON) 0.1 % cream Apply 1 application topically daily. Apply to affected skin daily.  45 g  0  . nitroGLYCERIN (NITROSTAT) 0.4 MG SL tablet  Place 1 tablet (0.4 mg total) under the tongue every 5 (five) minutes as needed for chest pain.  15 tablet  0  . oxyCODONE-acetaminophen (PERCOCET) 10-325 MG per tablet Take 1 tablet by mouth daily as needed for pain (Breakthrough pain only).  15 tablet  0  . oxyCODONE-acetaminophen (ROXICET) 5-325 MG per tablet Take 1 tablet by mouth every 8 (eight) hours as needed.  90 tablet  0  . pantoprazole (PROTONIX) 40 MG tablet Take 1 tablet (40 mg total) by mouth daily.  90 tablet  3  . promethazine (PHENERGAN) 25 MG suppository Place 1 suppository (25 mg total) rectally every 6 (six) hours as needed for nausea or vomiting.  12 each  0  . zolpidem (AMBIEN) 5 MG tablet Take 1 tablet (5 mg total) by mouth at bedtime as needed for sleep.  30 tablet  2   No current facility-administered medications for this visit.    Review of Systems Review of  Systems  Constitutional: Negative for fever, chills and unexpected weight change.  HENT: Negative for congestion, hearing loss, sore throat, trouble swallowing and voice change.   Eyes: Negative for visual disturbance.  Respiratory: Negative for cough and wheezing.   Cardiovascular: Negative for chest pain, palpitations and leg swelling.  Gastrointestinal: Positive for nausea, vomiting, abdominal pain, diarrhea and abdominal distention. Negative for constipation, blood in stool and anal bleeding.  Genitourinary: Negative for hematuria, vaginal bleeding and difficulty urinating.  Musculoskeletal: Negative for arthralgias.  Skin: Negative for rash and wound.  Neurological: Negative for seizures, syncope and headaches.  Hematological: Negative for adenopathy. Does not bruise/bleed easily.  Psychiatric/Behavioral: Negative for confusion.    Blood pressure 116/72, pulse 49, temperature 96.3 F (35.7 C), height 5\' 8"  (1.727 m), weight 212 lb (96.163 kg).  Physical Exam Physical Exam WDWN in NAD HEENT:  EOMI, sclera anicteric Neck:  No masses, no thyromegaly Lungs:  CTA bilaterally; normal respiratory effort CV:  Regular rate and rhythm; no murmurs Abd:  +bowel sounds, soft, moderate epigastric tenderness; no palpable masses Ext:  Well-perfused; no edema Skin:  Warm, dry; no sign of jaundice  Data Reviewed Lab Results  Component Value Date   ALT 14 08/09/2013   AST 15 08/09/2013   ALKPHOS 59 08/09/2013   BILITOT 0.5 08/09/2013   Lab Results  Component Value Date   WBC 6.4 04/02/2013   HGB 12.7 04/02/2013   HCT 37.8 04/02/2013   MCV 85.9 04/02/2013   PLT 212 04/02/2013   Nm Hepato W/eject Fract  09/23/2013   CLINICAL DATA:  Upper abdominal pain with nausea and vomiting  EXAM: NUCLEAR MEDICINE HEPATOBILIARY IMAGING WITH GALLBLADDER EF  Views: Anterior right upper quadrant  Radionuclide:  Technetium 70m Choletec  Dose:  5.5 mCi  Route of administration: Intravenous  COMPARISON:  None.  FINDINGS:  Liver uptake of radiotracer is normal. There is prompt visualization of gallbladder and small bowel, indicating patency of the cystic and common bile ducts. A weight based dose, 2.0 mcg, of CCK was administered intravenously with calculation of the computer generated ejection fraction of radiotracer from the gallbladder. The patient did not experience pain with CCK administration. The computer generated ejection fraction of radiotracer from the gallbladder is abnormally low at 27.6%, normal greater than 38%.  IMPRESSION: Abnormally low ejection fraction of radiotracer the gallbladder. This finding is concerning for biliary dyskinesia. Cystic and common bile ducts are patent as is evidenced by visualization of gallbladder and small bowel.   Electronically Signed   By:  Lowella Grip M.D.   On: 09/23/2013 14:50      Assessment    Biliary dyskinesia     Plan    Laparoscopic cholecystectomy with intraoperative cholangiogram.  The surgical procedure has been discussed with the patient.  Potential risks, benefits, alternative treatments, and expected outcomes have been explained.  All of the patient's questions at this time have been answered.  The likelihood of reaching the patient's treatment goal is good.  The patient understand the proposed surgical procedure and wishes to proceed.         Kiesha Ensey K. 10/15/2013, 10:42 AM

## 2013-10-22 ENCOUNTER — Telehealth (INDEPENDENT_AMBULATORY_CARE_PROVIDER_SITE_OTHER): Payer: Self-pay | Admitting: General Surgery

## 2013-10-22 NOTE — Telephone Encounter (Signed)
I will not prescribe any stronger pain medication.  She can take two of the new pain pills at a time, or she can take one of the pain pills that I described with one of her old pain pills.

## 2013-10-22 NOTE — Telephone Encounter (Signed)
Patient was informed of the information listed below from Dr. Georgette Dover.

## 2013-10-22 NOTE — Telephone Encounter (Signed)
Patient called in and is s/p lap chole on 10/21/13 w/ Dr. Georgette Dover.  The patient explained that she has been in excruciating pain at a level 12 all night.  It is a constant pain located all over the abdomen.  She explained that she normally takes Percocet 10-325 on a regular basis due to back problems but was discharged with Percocet 5/325.  She would like to have her prescription changed.  She has already been taking 2 of these at a time along with ibuprofen and she explains that it still is not touching the pain.  Denies N/V, fevers, chills. Informed her that I would route this message to Dr. Georgette Dover and once I receive a message, I will let her know about the Rx.

## 2013-10-25 ENCOUNTER — Encounter (HOSPITAL_COMMUNITY): Payer: Self-pay | Admitting: Surgery

## 2013-10-25 ENCOUNTER — Other Ambulatory Visit: Payer: Self-pay | Admitting: *Deleted

## 2013-10-25 DIAGNOSIS — M545 Low back pain: Secondary | ICD-10-CM

## 2013-10-25 MED ORDER — CYCLOBENZAPRINE HCL 10 MG PO TABS: 10.0000 mg | ORAL_TABLET | Freq: Three times a day (TID) | ORAL | Status: DC | PRN

## 2013-10-25 NOTE — Telephone Encounter (Signed)
Pt saw Dr. Parks Ranger on 10/12/13 for chronic LBP.  Will f/u in clinic in 2-3 months.

## 2013-11-02 ENCOUNTER — Telehealth (INDEPENDENT_AMBULATORY_CARE_PROVIDER_SITE_OTHER): Payer: Self-pay

## 2013-11-02 ENCOUNTER — Other Ambulatory Visit (INDEPENDENT_AMBULATORY_CARE_PROVIDER_SITE_OTHER): Payer: Self-pay | Admitting: Surgery

## 2013-11-02 MED ORDER — OXYCODONE-ACETAMINOPHEN 10-325 MG PO TABS: 1.0000 | ORAL_TABLET | Freq: Every day | ORAL | Status: DC | PRN

## 2013-11-02 NOTE — Telephone Encounter (Signed)
Pt called requesting refill pain med. Temp 99. No drainage at wds. BM daily. Pt states still very sore at umbilical area and bruised. Reviewed with Dr Georgette Dover. RX for Oxycodone 10/325 #30 printed by Dr Georgette Dover and at front desk for pick up. Pt aware.

## 2013-11-04 ENCOUNTER — Telehealth: Payer: Self-pay | Admitting: Family Medicine

## 2013-11-04 ENCOUNTER — Other Ambulatory Visit: Payer: Self-pay | Admitting: Family Medicine

## 2013-11-04 DIAGNOSIS — L723 Sebaceous cyst: Secondary | ICD-10-CM

## 2013-11-04 NOTE — Telephone Encounter (Signed)
Will forward to Dr. Gwendlyn Deutscher.  She saw her on derm clinic. Shannon Stuart,CMA

## 2013-11-04 NOTE — Telephone Encounter (Signed)
Cancelled appt for tomorrow. Needs Dr Gwendlyn Deutscher to schedule an appt with surgeon to cut out cyst. It has filled up again

## 2013-11-04 NOTE — Telephone Encounter (Signed)
Referral order has been placed. Let patient know this. She may also follow up with her PCP.

## 2013-11-04 NOTE — Telephone Encounter (Signed)
Placed in [place] and they will contact patient to schedule an appt. Jazmin Hartsell,CMA

## 2013-11-05 ENCOUNTER — Ambulatory Visit: Payer: Medicare Other | Admitting: Family Medicine

## 2013-11-15 ENCOUNTER — Ambulatory Visit (INDEPENDENT_AMBULATORY_CARE_PROVIDER_SITE_OTHER): Payer: Medicare Other | Admitting: Surgery

## 2013-11-15 ENCOUNTER — Encounter (INDEPENDENT_AMBULATORY_CARE_PROVIDER_SITE_OTHER): Payer: Self-pay | Admitting: Surgery

## 2013-11-15 VITALS — BP 130/84 | HR 51 | Temp 98.6°F | Ht 68.0 in | Wt 204.2 lb

## 2013-11-15 DIAGNOSIS — K828 Other specified diseases of gallbladder: Secondary | ICD-10-CM

## 2013-11-15 MED ORDER — CHOLESTYRAMINE 4 G PO PACK: 4.0000 g | PACK | Freq: Three times a day (TID) | ORAL | Status: DC

## 2013-11-15 MED ORDER — OXYCODONE-ACETAMINOPHEN 5-325 MG PO TABS: 1.0000 | ORAL_TABLET | ORAL | Status: DC | PRN

## 2013-11-15 NOTE — Progress Notes (Signed)
Status post laparoscopic cholecystectomy with cholangiogram on 10/21/13. This was done for biliary dyskinesia. The pathology showed mild chronic cholecystitis. The patient fell down after surgery and has some left hip/lower abdominal pain. She continues take medicine for this. Her abdominal symptoms seem to be improved. Her main complaint is that she still has frequent diarrhea, especially after eating. Her incisions are all well-healed with no sign of infection. No abdominal pain to palpation. She does have some left hip pain.  Filed Vitals:   11/15/13 1051  BP: 130/84  Pulse: 51  Temp: 98.6 F (37 C)   We will start her on Questran for her postcholecystectomy diarrhea. If this does not improve, recommend that she see Dr. Ardis Hughs in gastroenterology again for this problem. Her symptoms may not have been related to her gallbladder. If her hip pain continues, she should see orthopedic surgery for evaluation.  We will see her back as needed.  Shannon Stuart. Georgette Dover, MD, [facility]  General/ Trauma Surgery  11/15/2013 2:17 PM

## 2013-11-24 ENCOUNTER — Other Ambulatory Visit: Payer: Self-pay | Admitting: Family Medicine

## 2013-11-25 NOTE — Telephone Encounter (Signed)
Patient needs to be seen in clinic for LBP before any further refills will be given.

## 2013-12-10 ENCOUNTER — Ambulatory Visit (INDEPENDENT_AMBULATORY_CARE_PROVIDER_SITE_OTHER): Payer: Medicare Other | Admitting: Family Medicine

## 2013-12-10 ENCOUNTER — Encounter: Payer: Self-pay | Admitting: Family Medicine

## 2013-12-10 VITALS — BP 132/78 | HR 90 | Temp 97.9°F | Ht 68.0 in | Wt 208.0 lb

## 2013-12-10 DIAGNOSIS — R079 Chest pain, unspecified: Secondary | ICD-10-CM | POA: Diagnosis not present

## 2013-12-10 DIAGNOSIS — I10 Essential (primary) hypertension: Secondary | ICD-10-CM

## 2013-12-10 DIAGNOSIS — G8929 Other chronic pain: Secondary | ICD-10-CM

## 2013-12-10 DIAGNOSIS — G4700 Insomnia, unspecified: Secondary | ICD-10-CM

## 2013-12-10 DIAGNOSIS — M545 Low back pain, unspecified: Secondary | ICD-10-CM

## 2013-12-10 MED ORDER — CYCLOBENZAPRINE HCL 10 MG PO TABS: ORAL_TABLET | ORAL | Status: DC

## 2013-12-10 MED ORDER — KETOROLAC TROMETHAMINE 60 MG/2ML IM SOLN
60.0000 mg | Freq: Once | INTRAMUSCULAR | Status: AC
  Administered 2013-12-10: 60 mg via INTRAMUSCULAR

## 2013-12-10 MED ORDER — KETOROLAC TROMETHAMINE 60 MG/2ML IM SOLN: 60.0000 mg | Freq: Once | INTRAMUSCULAR | Status: DC

## 2013-12-10 NOTE — Assessment & Plan Note (Signed)
Blood pressure well controlled. Continue current medications. Will rerefer to cardiology for exertional chest pain.

## 2013-12-10 NOTE — Patient Instructions (Addendum)
Nice to meet you today!  I am giving you a shot of Toradol today and refilling your flexeril.  We will talk about chronic pain management at your next appointment.  We will do a drug screen today.  I am requesting records from Orthopedics.  Best, Dr. Jacinto Reap (Dr. Brita Romp)  Chronic Pain Chronic pain can be defined as pain that is off and on and lasts for 3-6 months or longer. Many things cause chronic pain, which can make it difficult to make a diagnosis. There are many treatment options available for chronic pain. However, finding a treatment that works well for you may require trying various approaches until the right one is found. Many people benefit from a combination of two or more types of treatment to control their pain. SYMPTOMS  Chronic pain can occur anywhere in the body and can range from mild to very severe. Some types of chronic pain include:  Headache.  Low back pain.  Cancer pain.  Arthritis pain.  Neurogenic pain. This is pain resulting from damage to nerves. People with chronic pain may also have other symptoms such as:  Depression.  Anger.  Insomnia.  Anxiety. DIAGNOSIS  Your health care provider will help diagnose your condition over time. In many cases, the initial focus will be on excluding possible conditions that could be causing the pain. Depending on your symptoms, your health care provider may order tests to diagnose your condition. Some of these tests may include:   Blood tests.   CT scan.   MRI.   X-rays.   Ultrasounds.   Nerve conduction studies.  You may need to see a specialist.  TREATMENT  Finding treatment that works well may take time. You may be referred to a pain specialist. He or she may prescribe medicine or therapies, such as:   Mindful meditation or yoga.  Shots (injections) of numbing or pain-relieving medicines into the spine or area of pain.  Local electrical stimulation.  Acupuncture.   Massage therapy.    Aroma, color, light, or sound therapy.   Biofeedback.   Working with a physical therapist to keep from getting stiff.   Regular, gentle exercise.   Cognitive or behavioral therapy.   Group support.  Sometimes, surgery may be recommended.  HOME CARE INSTRUCTIONS   Take all medicines as directed by your health care provider.   Lessen stress in your life by relaxing and doing things such as listening to calming music.   Exercise or be active as directed by your health care provider.   Eat a healthy diet and include things such as vegetables, fruits, fish, and lean meats in your diet.   Keep all follow-up appointments with your health care provider.   Attend a support group with others suffering from chronic pain. SEEK MEDICAL CARE IF:   Your pain gets worse.   You develop a new pain that was not there before.   You cannot tolerate medicines given to you by your health care provider.   You have new symptoms since your last visit with your health care provider.  SEEK IMMEDIATE MEDICAL CARE IF:   You feel weak.   You have decreased sensation or numbness.   You lose control of bowel or bladder function.   Your pain suddenly gets much worse.   You develop shaking.  You develop chills.  You develop confusion.  You develop chest pain.  You develop shortness of breath.  MAKE SURE YOU:  Understand these instructions.  Will watch  your condition.  Will get help right away if you are not doing well or get worse. Document Released: 12/01/2001 Document Revised: 11/11/2012 Document Reviewed: 09/04/2012 [facility] Patient Information 2015 [place], [place]. This information is not intended to replace advice given to you by your health care provider. Make sure you discuss any questions you have with your health care provider.

## 2013-12-10 NOTE — Assessment & Plan Note (Signed)
Review of data base shows that patient was prescribed a 30 day supply of Percocet on 12/01/13. Will not refill today.  We'll request records from orthopedics. UDS today. Pain contract at next visit. Refill of Flexeril and 60 mg Toradol IM injection given today.

## 2013-12-10 NOTE — Assessment & Plan Note (Signed)
Will rerefer to cardiology for exertional chest pain. Is stable and frequency unchanged.

## 2013-12-10 NOTE — Assessment & Plan Note (Signed)
>>  ASSESSMENT AND PLAN FOR HYPERTENSION, BENIGN ESSENTIAL WRITTEN ON 12/10/2013  4:49 PM BY BACIGALUPO, ANGELA, MD  Blood pressure well controlled. Continue current medications. Will rerefer to cardiology for exertional chest pain.

## 2013-12-10 NOTE — Progress Notes (Signed)
Subjective:   Shannon Stuart is a 53 y.o. 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   1. Chronic pain 2/2 OA in b/l knees, LBP: - Previously followed by Orthopedic Surgery, Hx 2 back surgeries, not interested in 3rd surgery, also not interested in bilateral knee replacement surgery - Pain is chronic stabbing, worse with standing  - Requests refill on pain medicine (Percocet 5s and 10s)  - Takes 3-4 Percocet 5/325s daily, and Percocet 10/325s only for breakthrough pain (to avoid using inc amounts of 5/325s, previously recommend by pharmacist / prior PCP)  - Additionally, gets Toradol 60mg  IM every other month, for > 1 year and takes flexeril TID - Denies fevers/chills, bowel / bladder incontinence, numbness/weakness of LEs - Has tried Voltaren, heat, ice, biofreeze, tramadol in the past  2. HTN: Takes lisinopril/HCTZ, metoprolol. Denies any shortness of breath. Reports chest pain with exertion, like jogging, but does not do this often. She was seen by cardiology in 10/2011 who did a Lexiscan.   3. Insomnia: She takes 5-10 mg 2-3 times a week when she is in pain he cannot sleep. She's been doing this for approximately 1.5 years.   Review of Systems:  Per HPI. All other systems reviewed and are negative.    Past Medical History: Patient Active Problem List   Diagnosis Date Noted  . Biliary dyskinesia 10/15/2013  . Chronic low back pain 10/12/2013  . Well woman exam 09/02/2013  . Sebaceous cyst 08/18/2013  . Dermatitis 08/18/2013  . Hip pain 08/09/2013  . Prediabetes 06/02/2013  . Abdominal pain, epigastric 04/10/2013  . Diarrhea 04/10/2013  . Left shoulder pain 02/20/2013  . Insomnia 08/18/2012  . Chest pain 10/08/2011  . Dermoid cyst 08/30/2011  . Arthritis of both knees 04/11/2011  . Lower extremity edema 09/24/2010  . Depression 08/10/2010  . Cough 05/24/2010  .  HYPERLIPIDEMIA 08/04/2009  . BACK PAIN, LUMBAR 01/13/2009  . FIBROIDS, UTERUS 04/29/2008  . 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 ANXIETY STATE NOS 09/15/2006  . RHINITIS, ALLERGIC NOS 09/08/2006  . GERD 09/08/2006  . OBESITY NOS 08/04/2006  . HYPERTENSION, BENIGN ESSENTIAL 08/04/2006    Medications: reviewed and updated Current Outpatient Prescriptions  Medication Sig Dispense Refill  . cholestyramine (QUESTRAN) 4 G packet Take 1 packet (4 g total) by mouth 3 (three) times daily with meals.  60 each  12  . Chromium-Cinnamon (CINNAMON PLUS CHROMIUM) 100-500 MCG-MG CAPS Take by mouth every morning.      . clonazePAM (KLONOPIN) 1 MG tablet Take 1 tablet (1 mg total) by mouth 3 (three) times daily as needed for anxiety.  90 tablet  1  . cyclobenzaprine (FLEXERIL) 10 MG tablet TAKE 1 TABLET (10 MG TOTAL) BY MOUTH 3 (THREE) TIMES DAILY AS NEEDED FOR MUSCLE SPASMS.  30 tablet  0  . Ginkgo Biloba 200 MG CAPS Take by mouth every morning.      Marland Kitchen ibuprofen (ADVIL,MOTRIN) 800 MG tablet Take 1 tablet (800 mg total) by mouth every 8 (eight) hours as needed for moderate pain.  90 tablet  4  . lisinopril-hydrochlorothiazide (PRINZIDE,ZESTORETIC) 20-25 MG per tablet Take 1 tablet by mouth daily.  90 tablet  11  . medroxyPROGESTERone (DEPO-PROVERA) 150 MG/ML injection Inject 1 mL (150 mg total) into the muscle every 3 (three) months.  1 mL  0  . metoprolol tartrate (LOPRESSOR) 25 MG tablet  Take 1 tablet (25 mg total) by mouth 2 (two) times daily.  180 tablet  4  . nitroGLYCERIN (NITROSTAT) 0.4 MG SL tablet Place 0.4 mg under the tongue every 5 (five) minutes as needed for chest pain.      Marland Kitchen oxyCODONE-acetaminophen (PERCOCET/ROXICET) 5-325 MG per tablet Take 1 tablet by mouth every 4 (four) hours as needed for severe pain.  40 tablet  0  . pantoprazole (PROTONIX) 40 MG tablet Take 1 tablet (40 mg total) by mouth daily.  90 tablet  3  . promethazine (PHENERGAN) 25 MG suppository Place 1 suppository (25  mg total) rectally every 6 (six) hours as needed for nausea or vomiting.  12 each  0  . zolpidem (AMBIEN) 5 MG tablet Take 1 tablet (5 mg total) by mouth at bedtime as needed for sleep.  30 tablet  2   No current facility-administered medications for this visit.    Objective:  BP 132/78  Pulse 90  Temp(Src) 97.9 F (36.6 C) (Oral)  Ht 5\' 8"  (1.727 m)  Wt 208 lb (94.348 kg)  BMI 31.63 kg/m2  Gen:  53 y.o. female sitting in NAD HEENT: NCAT, MMM, EOMI, PERRL, anicteric sclerae CV: RRR, no MRG, 2+ DP pulses b/l. Resp: Non-labored, CTAB, no wheezes noted Ext: WWP, no edema MSK: Full ROM, strength intact. No joint effusions appreciated. TTP diffusely over bilateral knees and low back. Neuro: Alert and oriented, speech normal, sensation grossly intact       Chemistry      Component Value Date/Time   NA 143 10/20/2013 0858   K 4.6 10/20/2013 0858   CL 104 10/20/2013 0858   CO2 24 10/20/2013 0858   BUN 29* 10/20/2013 0858   CREATININE 1.46* 10/20/2013 0858   CREATININE 1.13* 09/30/2012 1617      Component Value Date/Time   CALCIUM 8.7 10/20/2013 0858   ALKPHOS 61 10/20/2013 0858   AST 29 10/20/2013 0858   ALT 21 10/20/2013 0858   BILITOT 0.2* 10/20/2013 0858      Lab Results  Component Value Date   WBC 7.2 10/20/2013   HGB 12.7 10/20/2013   HCT 39.5 10/20/2013   MCV 89.4 10/20/2013   PLT 196 10/20/2013   Lab Results  Component Value Date   TSH 1.01 11/05/2011   Lab Results  Component Value Date   HGBA1C 6.0 08/09/2013   Assessment:     [name] is a 53 y.o. female here for  followup of chronic pain, hypertension, insomnia.     Plan:     See problem list for problem-specific plans.   Lavon Paganini, MD PGY-1,  [facility] 12/10/2013  4:38 PM

## 2013-12-10 NOTE — Assessment & Plan Note (Signed)
Review of data base shows that patient filled 30 day supply of Ambien on 11/25/13. We'll not refill today. We'll discuss at followup appointment.

## 2013-12-11 LAB — DRUG SCR UR, PAIN MGMT, REFLEX CONF
Amphetamine Screen, Ur: NEGATIVE
Barbiturate Quant, Ur: NEGATIVE
Benzodiazepines.: NEGATIVE
Cocaine Metabolites: NEGATIVE
Creatinine,U: 144.88 mg/dL
Methadone: NEGATIVE
Opiates: NEGATIVE
Phencyclidine (PCP): NEGATIVE
Propoxyphene: NEGATIVE

## 2013-12-14 ENCOUNTER — Encounter: Payer: Self-pay | Admitting: Family Medicine

## 2013-12-14 LAB — CANNABANOIDS (GC/LC/MS), URINE: THC-COOH (GC/LC/MS), ur confirm: 308 ng/mL — ABNORMAL HIGH (ref <5)

## 2013-12-21 ENCOUNTER — Ambulatory Visit: Payer: Medicare Other

## 2013-12-23 ENCOUNTER — Other Ambulatory Visit: Payer: Self-pay

## 2013-12-23 ENCOUNTER — Ambulatory Visit (INDEPENDENT_AMBULATORY_CARE_PROVIDER_SITE_OTHER): Payer: Medicare Other | Admitting: *Deleted

## 2013-12-23 ENCOUNTER — Ambulatory Visit: Payer: Medicare Other | Admitting: Nurse Practitioner

## 2013-12-23 ENCOUNTER — Encounter: Payer: Self-pay | Admitting: Nurse Practitioner

## 2013-12-23 DIAGNOSIS — I2510 Atherosclerotic heart disease of native coronary artery without angina pectoris: Secondary | ICD-10-CM | POA: Insufficient documentation

## 2013-12-23 DIAGNOSIS — I10 Essential (primary) hypertension: Secondary | ICD-10-CM | POA: Insufficient documentation

## 2013-12-23 DIAGNOSIS — Z3049 Encounter for surveillance of other contraceptives: Secondary | ICD-10-CM

## 2013-12-23 DIAGNOSIS — Z1239 Encounter for other screening for malignant neoplasm of breast: Secondary | ICD-10-CM

## 2013-12-23 DIAGNOSIS — Z9289 Personal history of other medical treatment: Secondary | ICD-10-CM

## 2013-12-23 DIAGNOSIS — Z1231 Encounter for screening mammogram for malignant neoplasm of breast: Secondary | ICD-10-CM

## 2013-12-23 DIAGNOSIS — R079 Chest pain, unspecified: Secondary | ICD-10-CM | POA: Insufficient documentation

## 2013-12-23 DIAGNOSIS — Z3042 Encounter for surveillance of injectable contraceptive: Secondary | ICD-10-CM

## 2013-12-23 DIAGNOSIS — E66812 Obesity, class 2: Secondary | ICD-10-CM | POA: Insufficient documentation

## 2013-12-23 DIAGNOSIS — E669 Obesity, unspecified: Secondary | ICD-10-CM | POA: Insufficient documentation

## 2013-12-23 MED ORDER — MEDROXYPROGESTERONE ACETATE 150 MG/ML IM SUSP
150.0000 mg | Freq: Once | INTRAMUSCULAR | Status: AC
  Administered 2013-12-23: 150 mg via INTRAMUSCULAR

## 2013-12-23 NOTE — Progress Notes (Signed)
   Pt in for Depo Provera injection.  Pt tolerated Depo injection. Depo given right upper outer quadrant.  Next injection due Dec 17-Mar 24, 2014.  Reminder card given. [name], RN

## 2014-01-03 ENCOUNTER — Encounter: Payer: Self-pay | Admitting: Family Medicine

## 2014-01-03 ENCOUNTER — Ambulatory Visit (INDEPENDENT_AMBULATORY_CARE_PROVIDER_SITE_OTHER): Payer: Medicare Other | Admitting: Family Medicine

## 2014-01-03 VITALS — BP 132/92 | HR 59 | Temp 98.3°F | Ht 68.0 in | Wt 205.0 lb

## 2014-01-03 DIAGNOSIS — Z23 Encounter for immunization: Secondary | ICD-10-CM

## 2014-01-03 DIAGNOSIS — G8929 Other chronic pain: Secondary | ICD-10-CM | POA: Diagnosis not present

## 2014-01-03 DIAGNOSIS — F411 Generalized anxiety disorder: Secondary | ICD-10-CM | POA: Diagnosis not present

## 2014-01-03 DIAGNOSIS — G4700 Insomnia, unspecified: Secondary | ICD-10-CM | POA: Diagnosis not present

## 2014-01-03 DIAGNOSIS — I10 Essential (primary) hypertension: Secondary | ICD-10-CM | POA: Diagnosis not present

## 2014-01-03 DIAGNOSIS — M545 Low back pain, unspecified: Secondary | ICD-10-CM

## 2014-01-03 DIAGNOSIS — Z20828 Contact with and (suspected) exposure to other viral communicable diseases: Secondary | ICD-10-CM

## 2014-01-03 MED ORDER — ZOLPIDEM TARTRATE 5 MG PO TABS: 5.0000 mg | ORAL_TABLET | Freq: Every evening | ORAL | Status: DC | PRN

## 2014-01-03 MED ORDER — CYCLOBENZAPRINE HCL 10 MG PO TABS: ORAL_TABLET | ORAL | Status: DC

## 2014-01-03 MED ORDER — CLONAZEPAM 1 MG PO TABS: 1.0000 mg | ORAL_TABLET | Freq: Two times a day (BID) | ORAL | Status: DC

## 2014-01-03 MED ORDER — LISINOPRIL-HYDROCHLOROTHIAZIDE 20-25 MG PO TABS: 1.0000 | ORAL_TABLET | Freq: Every day | ORAL | Status: DC

## 2014-01-03 MED ORDER — PANTOPRAZOLE SODIUM 40 MG PO TBEC: 40.0000 mg | DELAYED_RELEASE_TABLET | Freq: Every day | ORAL | Status: DC

## 2014-01-03 NOTE — Assessment & Plan Note (Signed)
Refill of 15 tablets of Ambien today.  Will try to decrease use at next visit.

## 2014-01-03 NOTE — Assessment & Plan Note (Signed)
Well-controlled. Continue current medications. Patient to make appointment with cardiology for exertional chest pain.

## 2014-01-03 NOTE — Assessment & Plan Note (Signed)
Continue Klonopin.  Will switch to scheduled BID dosing, rather than TID prn. - Would like to consider SSRI or buspar in the future.

## 2014-01-03 NOTE — Assessment & Plan Note (Addendum)
Per pain contract, no more narcotics. - Will taper flexeril, as it is not meant for long term use.: TID prn for 1 month, then BID prn for 1 month, then once daily for one month, then off. - Can offer lidoderm patch, gabapentin, nortryptiline, etc at next visit

## 2014-01-03 NOTE — Patient Instructions (Signed)
It was nice to see you today.  For your Klonopin, I am giving you enough to take twice daily for a month.  For the flexeril, I am prescribing enough to take 3 times daily for a month.  We will go down to 2 tablets daily next month and then 1 daily and then stop.  I will call you and send you a letter with the results of the blood test today.  Best, Dr. Brita Romp (Dr. B)  Cyclobenzaprine tablets What is this medicine? CYCLOBENZAPRINE (sye kloe BEN za preen) is a muscle relaxer. It is used to treat muscle pain, spasms, and stiffness. This medicine may be used for other purposes; ask your health care provider or pharmacist if you have questions. COMMON BRAND NAME(S): Fexmid, Flexeril What should I tell my health care provider before I take this medicine? They need to know if you have any of these conditions: -heart disease, irregular heartbeat, or previous heart attack -liver disease -thyroid problem -an unusual or allergic reaction to cyclobenzaprine, tricyclic antidepressants, lactose, other medicines, foods, dyes, or preservatives -pregnant or trying to get pregnant -breast-feeding How should I use this medicine? Take this medicine by mouth with a glass of water. Follow the directions on the prescription label. If this medicine upsets your stomach, take it with food or milk. Take your medicine at regular intervals. Do not take it more often than directed. Talk to your pediatrician regarding the use of this medicine in children. Special care may be needed. Overdosage: If you think you have taken too much of this medicine contact a poison control center or emergency room at once. NOTE: This medicine is only for you. Do not share this medicine with others. What if I miss a dose? If you miss a dose, take it as soon as you can. If it is almost time for your next dose, take only that dose. Do not take double or extra doses. What may interact with this medicine? Do not take this medicine  with any of the following medications: -certain medicines for fungal infections like fluconazole, itraconazole, ketoconazole, posaconazole, voriconazole -cisapride -dofetilide -dronedarone -droperidol -flecainide -grepafloxacin -halofantrine -levomethadyl -MAOIs like Carbex, Eldepryl, Marplan, Nardil, and Parnate -nilotinib -pimozide -probucol -sertindole -thioridazine -ziprasidone This medicine may also interact with the following medications: -abarelix -alcohol -certain medicines for cancer -certain medicines for depression, anxiety, or psychotic disturbances -certain medicines for infection like alfuzosin, chloroquine, clarithromycin, levofloxacin, mefloquine, pentamidine, troleandomycin -certain medicines for an irregular heart beat -certain medicines used for sleep or numbness during surgery or procedure -contrast dyes -dolasetron -guanethidine -methadone -octreotide -ondansetron -other medicines that prolong the QT interval (cause an abnormal heart rhythm) -palonosetron -phenothiazines like chlorpromazine, mesoridazine, prochlorperazine, thioridazine -tramadol -vardenafil This list may not describe all possible interactions. Give your health care provider a list of all the medicines, herbs, non-prescription drugs, or dietary supplements you use. Also tell them if you smoke, drink alcohol, or use illegal drugs. Some items may interact with your medicine. What should I watch for while using this medicine? Check with your doctor or health care professional if your condition does not improve within 1 to 3 weeks. You may get drowsy or dizzy when you first start taking the medicine or change doses. Do not drive, use machinery, or do anything that may be dangerous until you know how the medicine affects you. Stand or sit up slowly. Your mouth may get dry. Drinking water, chewing sugarless gum, or sucking on hard candy may help. What side effects may I  notice from receiving  this medicine? Side effects that you should report to your doctor or health care professional as soon as possible: -allergic reactions like skin rash, itching or hives, swelling of the face, lips, or tongue -chest pain -fast heartbeat -hallucinations -seizures -vomiting Side effects that usually do not require medical attention (report to your doctor or health care professional if they continue or are bothersome): -headache This list may not describe all possible side effects. Call your doctor for medical advice about side effects. You may report side effects to FDA at 1-800-FDA-1088. Where should I keep my medicine? Keep out of the reach of children. Store at room temperature between 15 and 30 degrees C (59 and 86 degrees F). Keep container tightly closed. Throw away any unused medicine after the expiration date. NOTE: This sheet is a summary. It may not cover all possible information. If you have questions about this medicine, talk to your doctor, pharmacist, or health care provider.  2015, [employer]vier/[employer] Standard. (2012-10-06 12:48:19)

## 2014-01-03 NOTE — Progress Notes (Signed)
   Subjective:   ZYONA PETTAWAY is a 53 y.o. 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  1. Chronic pain:  - Not interested in surgical options, followed by Ortho - Requests refill on pain medicine (Percocet 5s and 10s) - Received letter after last visit (UDS + marijuana), stating that we will no longer prescribe narcotics. - Taking flexeril TID x5+ years - Denies fevers/chills, bowel / bladder incontinence, numbness/weakness of LEs  - Has tried Voltaren, heat, ice, biofreeze, tramadol in the past  2. HTN: Takes lisinopril/HCTZ, metoprolol. Denies any shortness of breath. Reported chest pain with exertion during last office visit. She was seen by cardiology in 10/2011 who did a Lexiscan. She has referral for Cards, needs to make appointment.  3. Insomnia: She takes Ambien 5 mg 2-3 times a week when she is in pain and cannot sleep. She's been doing this for approximately 1.5-2 years.   4. Anxiety: Takes Klonopin 2-3 times daily for anxiety related to pain.  Has been on Klonopin for >4 years.  Tried amitryptiline in the past, but did not like how it made her feel.  Unwilling to try any SSRI, as she thinks they will soon be recalled.   Review of Systems:  Per HPI. All other systems reviewed and are negative.   PMH, PSH, Medications, Allergies, and FmHx reviewed and updated in EMR.  Social History: former smoker  Objective:  BP 132/92  Pulse 59  Temp(Src) 98.3 F (36.8 C) (Oral)  Ht 5\' 8"  (1.727 m)  Wt 205 lb (92.987 kg)  BMI 31.18 kg/m2  Gen:  53 y.o. female in NAD CV: RRR, no MRG Resp: Non-labored, CTAB, no wheezes noted Ext: WWP, no edema MSK: Full ROM, strength intact, TTP over b/l knees and b/l lumbar region, no effusions aprpeciated. Neuro: Alert and oriented, speech normal      Chemistry      Component Value Date/Time   NA 143 10/20/2013 0858   K 4.6 10/20/2013 0858   CL 104 10/20/2013  0858   CO2 24 10/20/2013 0858   BUN 29* 10/20/2013 0858   CREATININE 1.46* 10/20/2013 0858   CREATININE 1.13* 09/30/2012 1617      Component Value Date/Time   CALCIUM 8.7 10/20/2013 0858   ALKPHOS 61 10/20/2013 0858   AST 29 10/20/2013 0858   ALT 21 10/20/2013 0858   BILITOT 0.2* 10/20/2013 0858      Assessment:     NAKYLA BRACCO is a 53 y.o. female here for f/u chronic pain, HTN, insomnia, anxiety.    Plan:     See problem list for problem-specific plans.   Lavon Paganini, MD PGY-1,  [facility] 01/03/2014  4:26 PM

## 2014-01-03 NOTE — Assessment & Plan Note (Signed)
>>  ASSESSMENT AND PLAN FOR HYPERTENSION, BENIGN ESSENTIAL WRITTEN ON 01/03/2014  4:46 PM BY BACIGALUPO, ANGELA, MD  Well-controlled. Continue current medications. Patient to make appointment with cardiology for exertional chest pain.

## 2014-01-04 LAB — HIV ANTIBODY (ROUTINE TESTING W REFLEX): HIV 1&2 Ab, 4th Generation: NONREACTIVE

## 2014-01-05 ENCOUNTER — Encounter: Payer: Self-pay | Admitting: Family Medicine

## 2014-01-05 ENCOUNTER — Telehealth: Payer: Self-pay | Admitting: Family Medicine

## 2014-01-05 NOTE — Telephone Encounter (Signed)
Called patient to review lab results.  Not available, left VM to call back for results.  If patient calls back, can someone please inform patient that HIV test was negative.  Will also send letter with information, as asked to by patient in last office visit.  Lavon Paganini, MD, MPH PGY-1,  [facility] 01/05/2014  9:43 AM

## 2014-01-06 ENCOUNTER — Ambulatory Visit
Admission: RE | Admit: 2014-01-06 | Discharge: 2014-01-06 | Disposition: A | Discharge status: Home / Self Care | Payer: Medicare Other | Source: Ambulatory Visit

## 2014-01-06 DIAGNOSIS — Z1231 Encounter for screening mammogram for malignant neoplasm of breast: Secondary | ICD-10-CM | POA: Diagnosis not present

## 2014-01-13 ENCOUNTER — Other Ambulatory Visit: Payer: Self-pay | Admitting: Family Medicine

## 2014-01-13 DIAGNOSIS — R928 Other abnormal and inconclusive findings on diagnostic imaging of breast: Secondary | ICD-10-CM

## 2014-01-24 ENCOUNTER — Encounter: Payer: Self-pay | Admitting: Family Medicine

## 2014-02-03 ENCOUNTER — Ambulatory Visit
Admission: RE | Admit: 2014-02-03 | Discharge: 2014-02-03 | Disposition: A | Discharge status: Home / Self Care | Payer: Medicare Other | Source: Ambulatory Visit | Attending: *Deleted | Admitting: *Deleted

## 2014-02-03 DIAGNOSIS — N63 Unspecified lump in breast: Secondary | ICD-10-CM | POA: Diagnosis not present

## 2014-02-03 DIAGNOSIS — R928 Other abnormal and inconclusive findings on diagnostic imaging of breast: Secondary | ICD-10-CM

## 2014-02-03 DIAGNOSIS — N6012 Diffuse cystic mastopathy of left breast: Secondary | ICD-10-CM | POA: Diagnosis not present

## 2014-02-26 ENCOUNTER — Emergency Department (HOSPITAL_BASED_OUTPATIENT_CLINIC_OR_DEPARTMENT_OTHER)
Admission: EM | Admit: 2014-02-26 | Discharge: 2014-02-26 | Disposition: A | Discharge status: Home / Self Care | Payer: Medicare Other | Attending: Emergency Medicine | Admitting: Emergency Medicine

## 2014-02-26 ENCOUNTER — Encounter (HOSPITAL_BASED_OUTPATIENT_CLINIC_OR_DEPARTMENT_OTHER): Payer: Self-pay | Admitting: *Deleted

## 2014-02-26 DIAGNOSIS — I2510 Atherosclerotic heart disease of native coronary artery without angina pectoris: Secondary | ICD-10-CM | POA: Diagnosis not present

## 2014-02-26 DIAGNOSIS — Z87891 Personal history of nicotine dependence: Secondary | ICD-10-CM | POA: Diagnosis not present

## 2014-02-26 DIAGNOSIS — K219 Gastro-esophageal reflux disease without esophagitis: Secondary | ICD-10-CM | POA: Diagnosis not present

## 2014-02-26 DIAGNOSIS — Z793 Long term (current) use of hormonal contraceptives: Secondary | ICD-10-CM | POA: Insufficient documentation

## 2014-02-26 DIAGNOSIS — E669 Obesity, unspecified: Secondary | ICD-10-CM | POA: Insufficient documentation

## 2014-02-26 DIAGNOSIS — G8929 Other chronic pain: Secondary | ICD-10-CM | POA: Diagnosis not present

## 2014-02-26 DIAGNOSIS — M25551 Pain in right hip: Secondary | ICD-10-CM | POA: Insufficient documentation

## 2014-02-26 DIAGNOSIS — R112 Nausea with vomiting, unspecified: Secondary | ICD-10-CM | POA: Insufficient documentation

## 2014-02-26 DIAGNOSIS — N809 Endometriosis, unspecified: Secondary | ICD-10-CM | POA: Diagnosis not present

## 2014-02-26 DIAGNOSIS — M545 Low back pain: Secondary | ICD-10-CM | POA: Insufficient documentation

## 2014-02-26 DIAGNOSIS — M549 Dorsalgia, unspecified: Secondary | ICD-10-CM | POA: Diagnosis not present

## 2014-02-26 DIAGNOSIS — Z79899 Other long term (current) drug therapy: Secondary | ICD-10-CM | POA: Insufficient documentation

## 2014-02-26 DIAGNOSIS — Z791 Long term (current) use of non-steroidal anti-inflammatories (NSAID): Secondary | ICD-10-CM | POA: Insufficient documentation

## 2014-02-26 DIAGNOSIS — E785 Hyperlipidemia, unspecified: Secondary | ICD-10-CM | POA: Insufficient documentation

## 2014-02-26 DIAGNOSIS — R51 Headache: Secondary | ICD-10-CM | POA: Insufficient documentation

## 2014-02-26 DIAGNOSIS — M542 Cervicalgia: Secondary | ICD-10-CM | POA: Diagnosis not present

## 2014-02-26 DIAGNOSIS — R197 Diarrhea, unspecified: Secondary | ICD-10-CM | POA: Insufficient documentation

## 2014-02-26 DIAGNOSIS — M25562 Pain in left knee: Secondary | ICD-10-CM | POA: Insufficient documentation

## 2014-02-26 DIAGNOSIS — M25561 Pain in right knee: Secondary | ICD-10-CM | POA: Diagnosis not present

## 2014-02-26 DIAGNOSIS — Z8701 Personal history of pneumonia (recurrent): Secondary | ICD-10-CM | POA: Insufficient documentation

## 2014-02-26 DIAGNOSIS — R6883 Chills (without fever): Secondary | ICD-10-CM | POA: Insufficient documentation

## 2014-02-26 DIAGNOSIS — I10 Essential (primary) hypertension: Secondary | ICD-10-CM | POA: Insufficient documentation

## 2014-02-26 DIAGNOSIS — Z9889 Other specified postprocedural states: Secondary | ICD-10-CM | POA: Insufficient documentation

## 2014-02-26 DIAGNOSIS — F419 Anxiety disorder, unspecified: Secondary | ICD-10-CM | POA: Diagnosis not present

## 2014-02-26 DIAGNOSIS — M179 Osteoarthritis of knee, unspecified: Secondary | ICD-10-CM | POA: Insufficient documentation

## 2014-02-26 DIAGNOSIS — M25552 Pain in left hip: Secondary | ICD-10-CM | POA: Insufficient documentation

## 2014-02-26 MED ORDER — HYDROMORPHONE HCL 1 MG/ML IJ SOLN
INTRAMUSCULAR | Status: AC
  Administered 2014-02-26: 2 mg via INTRAMUSCULAR
  Filled 2014-02-26: qty 2

## 2014-02-26 MED ORDER — HYDROMORPHONE HCL 2 MG/ML IJ SOLN
2.0000 mg | Freq: Once | INTRAMUSCULAR | Status: AC
  Filled 2014-02-26: qty 1

## 2014-02-26 MED ORDER — HYDROMORPHONE HCL 1 MG/ML IJ SOLN
INTRAMUSCULAR | Status: AC
  Filled 2014-02-26: qty 2

## 2014-02-26 MED ORDER — OXYCODONE-ACETAMINOPHEN 5-325 MG PO TABS: 1.0000 | ORAL_TABLET | Freq: Four times a day (QID) | ORAL | Status: DC | PRN

## 2014-02-26 NOTE — ED Notes (Signed)
Pt discharged to home with family. NAD.  

## 2014-02-26 NOTE — Discharge Instructions (Signed)
Make appointment to follow-up with your [facility] doctor in the next few days. Short-term prescription to a tight jewelry for the chronic pain provided.

## 2014-02-26 NOTE — ED Provider Notes (Signed)
6CSN: [id number]     Arrival date & time 02/26/14  1448 History   This chart was scribed for Fredia Sorrow, MD by Martinique Peace, ED Scribe. The patient was seen in MH03/MH03. The patient's care was started at 4:29 PM.    Chief Complaint  Patient presents with  . Back Pain      Patient is a 53 y.o. female presenting with back pain. The history is provided by the patient. No language interpreter was used.  Back Pain Associated symptoms: headaches   Associated symptoms: no abdominal pain, no chest pain and no fever     HPI Comments:Shannon Stuart is a 53 y.o. female who presents to the Emergency Department complaining of lower back pain, bilateral hip pain, and bilateral hip pain. Also complains of nausea and diarrhea. History of falls recently. Pt states that she usually takes Percocet for pain but has not had any in a while. Pt's PCP is [facility]. Scheduled to get to a second opinion at [facility].    Past Medical History  Diagnosis Date  . Allergy   . Depression   . Hyperlipidemia     a. 07/2013 LDL 126 - not on statin.  Marland Kitchen Hypertension   . Endometriosis   . GERD (gastroesophageal reflux disease)   . Obesity, Class II, BMI 35-39.9   . History of pneumonia   . Anxiety     a. takes mostly daily klonopin  . Headache(784.0)   . Osteoarthritis     a. bilateral knees.  . Chest pain at rest   . Chronic low back pain   . Non-obstructive CAD     a. Cath 2009 - minimal CAD;  b. 10/2011 Lexiscan Myoview: No ischemia or infarct.  . Biliary dyskinesia     a. 09/2013 s/p Lap Chole ([place]).   Past Surgical History  Procedure Laterality Date  . Brain tumor excision    . Lumbar disc surgery  04/1986, 12/1986    x 2  . Tissue graft    . Tonsillectomy    . Cholecystectomy N/A 10/21/2013    Procedure: LAPAROSCOPIC CHOLECYSTECTOMY WITH INTRAOPERATIVE CHOLANGIOGRAM;  Surgeon: Imogene Burn. Georgette Dover, MD;  Location: [facility];  Service: General;  Laterality: N/A;   Family  History  Problem Relation Age of Onset  . Diabetes Maternal Grandmother   . Heart failure Maternal Grandmother     CHF  . Diabetes Sister   . Diabetes Sister     gestational  . Heart disease Mother 58    MI  . Hypertension Other     entire family  . Colon cancer Neg Hx   . Esophageal cancer Neg Hx   . Stomach cancer Neg Hx    History  Substance Use Topics  . Smoking status: Former Smoker    Types: Cigarettes    Quit date: 03/26/1991  . Smokeless tobacco: Never Used  . Alcohol Use: Yes     Comment: 'SOCIALLY"   OB History    Gravida Para Term Preterm AB TAB SAB Ectopic Multiple Living   1 1 1       1      Review of Systems  Constitutional: Positive for chills. Negative for fever.  HENT: Negative for rhinorrhea and sore throat.   Eyes: Negative for visual disturbance.  Respiratory: Negative for cough.   Cardiovascular: Negative for chest pain.  Gastrointestinal: Positive for nausea, vomiting and diarrhea. Negative for abdominal pain.  Musculoskeletal: Positive for back pain, arthralgias  and neck pain. Negative for joint swelling.  Neurological: Positive for headaches.  Hematological: Does not bruise/bleed easily.  Psychiatric/Behavioral: Negative for confusion.      Allergies  Zofran  Home Medications   Prior to Admission medications   Medication Sig Start Date End Date Taking? Authorizing Provider  cholestyramine (QUESTRAN) 4 G packet Take 1 packet (4 g total) by mouth 3 (three) times daily with meals. 11/15/13   Donnie Mesa, MD  Chromium-Cinnamon (CINNAMON PLUS CHROMIUM) 100-500 MCG-MG CAPS Take by mouth every morning.    Historical Provider, MD  clonazePAM (KLONOPIN) 1 MG tablet Take 1 tablet (1 mg total) by mouth 2 (two) times daily. 01/03/14   Lavon Paganini, MD  cyclobenzaprine (FLEXERIL) 10 MG tablet TAKE 1 TABLET (10 MG TOTAL) BY MOUTH 3 (THREE) TIMES DAILY AS NEEDED FOR MUSCLE SPASMS. 01/03/14   Lavon Paganini, MD  Ginkgo Biloba 200 MG CAPS Take by  mouth every morning.    Historical Provider, MD  ibuprofen (ADVIL,MOTRIN) 800 MG tablet Take 1 tablet (800 mg total) by mouth every 8 (eight) hours as needed for moderate pain. 05/07/13   Kandis Nab, MD  lisinopril-hydrochlorothiazide (PRINZIDE,ZESTORETIC) 20-25 MG per tablet Take 1 tablet by mouth daily. 01/03/14 04/05/15  Lavon Paganini, MD  medroxyPROGESTERone (DEPO-PROVERA) 150 MG/ML injection Inject 1 mL (150 mg total) into the muscle every 3 (three) months. 03/04/12   Ivy Petersburg, DO  metoprolol tartrate (LOPRESSOR) 25 MG tablet Take 1 tablet (25 mg total) by mouth 2 (two) times daily. 04/08/13   Kandis Nab, MD  nitroGLYCERIN (NITROSTAT) 0.4 MG SL tablet Place 0.4 mg under the tongue every 5 (five) minutes as needed for chest pain.    Historical Provider, MD  oxyCODONE-acetaminophen (PERCOCET/ROXICET) 5-325 MG per tablet Take 1-2 tablets by mouth every 6 (six) hours as needed for moderate pain or severe pain. 02/26/14   Fredia Sorrow, MD  pantoprazole (PROTONIX) 40 MG tablet Take 1 tablet (40 mg total) by mouth daily. 01/03/14   Lavon Paganini, MD  promethazine (PHENERGAN) 25 MG suppository Place 1 suppository (25 mg total) rectally every 6 (six) hours as needed for nausea or vomiting. 04/02/13   Harvie Heck, PA-C  zolpidem (AMBIEN) 5 MG tablet Take 1 tablet (5 mg total) by mouth at bedtime as needed for sleep. 01/03/14   Lavon Paganini, MD   BP 140/96 mmHg  Pulse 74  Temp(Src) 99.7 F (37.6 C) (Oral)  Resp 18  Ht 5\' 8"  (1.727 m)  Wt 199 lb (90.266 kg)  BMI 30.26 kg/m2  SpO2 98% Physical Exam  Constitutional: She is oriented to person, place, and time. She appears well-developed and well-nourished. No distress.  HENT:  Head: Normocephalic and atraumatic.  Mouth/Throat: Oropharynx is clear and moist and mucous membranes are normal.  Eyes: Conjunctivae and EOM are normal. Pupils are equal, round, and reactive to light. No scleral icterus.  Neck: Neck supple. No  tracheal deviation present.  Cardiovascular: Normal rate, regular rhythm and normal heart sounds.   No murmur heard. Pulmonary/Chest: Effort normal. No respiratory distress. She has no rales.  Abdominal: Bowel sounds are normal. There is no tenderness.  Musculoskeletal: Normal range of motion.  Neurological: She is alert and oriented to person, place, and time. No cranial nerve deficit. She exhibits normal muscle tone. Coordination normal.  Skin: Skin is warm and dry.  Psychiatric: She has a normal mood and affect. Her behavior is normal.  Nursing note and vitals reviewed.   ED Course  Procedures (including critical care time) Labs Review Labs Reviewed - No data to display  Imaging Review No results found.   EKG Interpretation None     Medications  HYDROmorphone (DILAUDID) injection 2 mg (not administered)    4:57 PM- Treatment plan was discussed with patient who verbalizes understanding and agrees.   MDM   Final diagnoses:  Chronic pain    [facility]. Patient with a history of chronic back pain and bilateral knee pain and bilateral hip pain. No recent falls or recent injuries. Patient the ran out of her pain medicine. Will give patient the IM injection of hydromorphone here and a short course of Percocet for her pain and follow-up with her family practice doctor.  I personally performed the services described in this documentation, which was scribed in my presence. The recorded information has been reviewed and is accurate.     Fredia Sorrow, MD 02/26/14 1659

## 2014-02-26 NOTE — ED Notes (Signed)
Pt reports she has been having falls- is scheduled to see ortho- has been having back and hip pain-

## 2014-03-22 DIAGNOSIS — M5136 Other intervertebral disc degeneration, lumbar region: Secondary | ICD-10-CM | POA: Diagnosis not present

## 2014-03-22 DIAGNOSIS — M5441 Lumbago with sciatica, right side: Secondary | ICD-10-CM | POA: Diagnosis not present

## 2014-03-25 HISTORY — PX: CHOLECYSTECTOMY: SHX55

## 2014-03-31 DIAGNOSIS — M5136 Other intervertebral disc degeneration, lumbar region: Secondary | ICD-10-CM | POA: Diagnosis not present

## 2014-03-31 DIAGNOSIS — M5441 Lumbago with sciatica, right side: Secondary | ICD-10-CM | POA: Diagnosis not present

## 2014-04-01 ENCOUNTER — Other Ambulatory Visit: Payer: Self-pay | Admitting: Family Medicine

## 2014-04-07 DIAGNOSIS — M5136 Other intervertebral disc degeneration, lumbar region: Secondary | ICD-10-CM | POA: Diagnosis not present

## 2014-04-19 DIAGNOSIS — M5441 Lumbago with sciatica, right side: Secondary | ICD-10-CM | POA: Diagnosis not present

## 2014-04-19 DIAGNOSIS — M5136 Other intervertebral disc degeneration, lumbar region: Secondary | ICD-10-CM | POA: Diagnosis not present

## 2014-04-19 DIAGNOSIS — M7138 Other bursal cyst, other site: Secondary | ICD-10-CM | POA: Diagnosis not present

## 2014-04-19 DIAGNOSIS — M5416 Radiculopathy, lumbar region: Secondary | ICD-10-CM | POA: Diagnosis not present

## 2014-05-13 ENCOUNTER — Encounter: Payer: Self-pay | Admitting: Family Medicine

## 2014-05-13 ENCOUNTER — Telehealth: Payer: Self-pay | Admitting: Family Medicine

## 2014-05-13 MED ORDER — ZOLPIDEM TARTRATE 5 MG PO TABS: 5.0000 mg | ORAL_TABLET | Freq: Every evening | ORAL | Status: DC | PRN

## 2014-05-13 NOTE — Telephone Encounter (Signed)
Patient informed, expressed understanding. 

## 2014-05-13 NOTE — Telephone Encounter (Signed)
Need letter stating that patient has been reporting to physician concerning having uncontrollable bowel and also need refill on her Lorrin Mais and when she can come in for her injection for pain

## 2014-05-13 NOTE — Telephone Encounter (Signed)
Letter and ambien prescription available at front desk.  Lavon Paganini, MD, MPH PGY-1,  [facility] 05/13/2014 1:42 PM

## 2014-05-13 NOTE — Telephone Encounter (Signed)
Called patient to discuss "uncontrollable diarrhea."  She has been followed by Gen Surg for post-cholecystectomy diarrhea, but has mentioned diarrhea to Dr. Otis Dials previously.  She would just like a letter stating these things.  She refuses to ask Surgery for a note.  Will write letter later today stating these facts and leave at front desk.  Will also leave 1 refill of ambien at front desk, but patient needs appt before further refills given.  Also needs appt for IM Toradol injection.  Lavon Paganini, MD, MPH PGY-1,  [facility] 05/13/2014 11:49 AM

## 2014-05-25 ENCOUNTER — Ambulatory Visit (INDEPENDENT_AMBULATORY_CARE_PROVIDER_SITE_OTHER): Payer: Medicare Other | Admitting: Family Medicine

## 2014-05-25 ENCOUNTER — Encounter: Payer: Self-pay | Admitting: Family Medicine

## 2014-05-25 VITALS — BP 143/90 | HR 58 | Temp 98.4°F | Ht 68.0 in | Wt 270.0 lb

## 2014-05-25 DIAGNOSIS — R252 Cramp and spasm: Secondary | ICD-10-CM | POA: Diagnosis not present

## 2014-05-25 DIAGNOSIS — Z309 Encounter for contraceptive management, unspecified: Secondary | ICD-10-CM | POA: Diagnosis not present

## 2014-05-25 DIAGNOSIS — G4700 Insomnia, unspecified: Secondary | ICD-10-CM | POA: Diagnosis not present

## 2014-05-25 DIAGNOSIS — M545 Low back pain, unspecified: Secondary | ICD-10-CM

## 2014-05-25 DIAGNOSIS — I10 Essential (primary) hypertension: Secondary | ICD-10-CM

## 2014-05-25 DIAGNOSIS — F411 Generalized anxiety disorder: Secondary | ICD-10-CM

## 2014-05-25 DIAGNOSIS — G8929 Other chronic pain: Secondary | ICD-10-CM | POA: Diagnosis not present

## 2014-05-25 MED ORDER — NORTRIPTYLINE HCL 50 MG PO CAPS: 50.0000 mg | ORAL_CAPSULE | Freq: Every day | ORAL | Status: DC

## 2014-05-25 MED ORDER — MEDROXYPROGESTERONE ACETATE 150 MG/ML IM SUSP
150.0000 mg | Freq: Once | INTRAMUSCULAR | Status: AC
  Administered 2014-05-25: 150 mg via INTRAMUSCULAR

## 2014-05-25 MED ORDER — CLONAZEPAM 1 MG PO TABS: 1.0000 mg | ORAL_TABLET | Freq: Two times a day (BID) | ORAL | Status: DC | PRN

## 2014-05-25 MED ORDER — ZOLPIDEM TARTRATE 5 MG PO TABS: 5.0000 mg | ORAL_TABLET | Freq: Every evening | ORAL | Status: DC | PRN

## 2014-05-25 MED ORDER — KETOROLAC TROMETHAMINE 60 MG/2ML IM SOLN
60.0000 mg | Freq: Once | INTRAMUSCULAR | Status: AC
  Administered 2014-05-25: 60 mg via INTRAMUSCULAR

## 2014-05-25 MED ORDER — CLONAZEPAM 1 MG PO TABS: 1.0000 mg | ORAL_TABLET | Freq: Two times a day (BID) | ORAL | Status: DC

## 2014-05-25 MED ORDER — IBUPROFEN 800 MG PO TABS: 800.0000 mg | ORAL_TABLET | Freq: Three times a day (TID) | ORAL | Status: DC | PRN

## 2014-05-25 NOTE — Assessment & Plan Note (Signed)
Currently above goal Likely related to pain Continue lisinopril HCTZ and Lopressor at current doses Follow-up at next visit

## 2014-05-25 NOTE — Patient Instructions (Signed)
It was nice to see you again today. We will request the records from Dr. Rolena Infante and I will review them when available. We are starting a medicine called nortriptyline for your back pain. This is a medicine you'll take every night at bedtime. We'll start with 50 mg every night. I would like to see you back in 2 weeks to see how is doing and we can increase the dose at that time if needed.  Take care, Dr. B  Chronic Back Pain  When back pain lasts longer than 3 months, it is called chronic back pain.People with chronic back pain often go through certain periods that are more intense (flare-ups).  CAUSES Chronic back pain can be caused by wear and tear (degeneration) on different structures in your back. These structures include:  The bones of your spine (vertebrae) and the joints surrounding your spinal cord and nerve roots (facets).  The strong, fibrous tissues that connect your vertebrae (ligaments). Degeneration of these structures may result in pressure on your nerves. This can lead to constant pain. HOME CARE INSTRUCTIONS  Avoid bending, heavy lifting, prolonged sitting, and activities which make the problem worse.  Take brief periods of rest throughout the day to reduce your pain. Lying down or standing usually is better than sitting while you are resting.  Take over-the-counter or prescription medicines only as directed by your caregiver. SEEK IMMEDIATE MEDICAL CARE IF:   You have weakness or numbness in one of your legs or feet.  You have trouble controlling your bladder or bowels.  You have nausea, vomiting, abdominal pain, shortness of breath, or fainting. Document Released: 04/18/2004 Document Revised: 06/03/2011 Document Reviewed: 02/23/2011 [facility] Patient Information 2015 [place], [place]. This information is not intended to replace advice given to you by your health care provider. Make sure you discuss any questions you have with your health care provider.

## 2014-05-25 NOTE — Assessment & Plan Note (Signed)
Could be neuropathic pain related to back pain Could also be related to electrolyte abnormalities We'll check bmet today Gabapentin considered, but patient refused to take

## 2014-05-25 NOTE — Assessment & Plan Note (Signed)
Patient reports ongoing back pain We'll request records from orthopedics Toradol 60 mg IM injection given today Continue Flexeril, but will try to wean and next visit Start nortriptyline 50 mg daily at bedtime Follow-up in 2 weeks to assess benefit from nortriptyline. Can consider increasing dose at that time

## 2014-05-25 NOTE — Assessment & Plan Note (Signed)
>>  ASSESSMENT AND PLAN FOR HYPERTENSION, BENIGN ESSENTIAL WRITTEN ON 05/25/2014  3:37 PM BY BACIGALUPO, ANGELA, MD  Currently above goal Likely related to pain Continue lisinopril  HCTZ and Lopressor  at current doses Follow-up at next visit

## 2014-05-25 NOTE — Progress Notes (Signed)
   Subjective:   Shannon Stuart is a 54 y.o. female with a history of chronic back pain, anxiety, HTN here for f/u of chronic issues  Chronic back pain - followed by Ortho - not interested in surgical options - next appt in April for CT scan - per pain contract, not receiving anymore narcotics from our clinic - doesn't want to take gabapentin, because she thinks it will be soon recalled 2/2 multiple SEs - thinks there may be some other type of medicine for muscle spasms but doesn't know the name  Cramps in LEs - ongoing for years - interrupts sleep - has to walk around at night to ease off - per Ortho (according to patient) is related to back pain - using heat and ice without any relief  HTN: In a lot of pain today.  Thinks that BP may be elevated 2/2 pain. - taking Lisinopril-HCTZ and lopressor.  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 All other systems reviewed and are negative.   PMH, PSH, Medications, Allergies, and FmHx reviewed and updated in EMR.  Social History: former smoker  Objective:  BP 143/90 mmHg  Pulse 58  Temp(Src) 98.4 F (36.9 C) (Oral)  Ht 5\' 8"  (1.727 m)  Wt 270 lb (122.471 kg)  BMI 41.06 kg/m2  Gen:  54 y.o. female in NAD HEENT: NCAT, MMM, EOMI, PERRL, anicteric sclerae CV: RRR, no MRG, no JVD Resp: Non-labored, CTAB, no wheezes noted Ext: WWP, no edema MSK:  ROM somewhat limited 2/2 pain, strength intact, back pain with straight leg raise, but no radiation to knee. TTP over lumbar spine Neuro: Alert and oriented, speech normal      Chemistry      Component Value Date/Time   NA 143 10/20/2013 0858   K 4.6 10/20/2013 0858   CL 104 10/20/2013 0858   CO2 24 10/20/2013 0858   BUN 29* 10/20/2013 0858   CREATININE 1.46* 10/20/2013 0858   CREATININE 1.13* 09/30/2012 1617       Component Value Date/Time   CALCIUM 8.7 10/20/2013 0858   ALKPHOS 61 10/20/2013 0858   AST 29 10/20/2013 0858   ALT 21 10/20/2013 0858   BILITOT 0.2* 10/20/2013 0858      Assessment:     Shannon Stuart is a 54 y.o. female here for f/u chronic issues    Plan:     See problem list for problem-specific plans.   Lavon Paganini, MD PGY-1,  [facility] 05/25/2014  2:36 PM

## 2014-05-25 NOTE — Assessment & Plan Note (Signed)
Klonopin 1 mg twice a day when necessary refilled Would like to consider BuSpar in the future but will not start today as we are starting nortriptyline

## 2014-05-25 NOTE — Assessment & Plan Note (Signed)
Refill 15 tablets of Ambien today Would like to see if there is some improvement on nortriptyline

## 2014-05-26 ENCOUNTER — Telehealth: Payer: Self-pay | Admitting: Family Medicine

## 2014-05-26 LAB — BASIC METABOLIC PANEL WITH GFR
BUN: 15 mg/dL (ref 6–23)
CO2: 28 meq/L (ref 19–32)
Calcium: 8.9 mg/dL (ref 8.4–10.5)
Chloride: 103 meq/L (ref 96–112)
Creat: 1.01 mg/dL (ref 0.50–1.10)
Glucose, Bld: 100 mg/dL — ABNORMAL HIGH (ref 70–99)
Potassium: 3.3 meq/L — ABNORMAL LOW (ref 3.5–5.3)
Sodium: 142 meq/L (ref 135–145)

## 2014-05-26 NOTE — Telephone Encounter (Signed)
Potassium slightly low at 3.3.  Patient's leg cramps could be related to this.  I recommend that she eat more potassium-rich foods, like bananas, avocados, fish, and dark green leafy vegetables.  Electrolytes otherwise wnl.  Lavon Paganini, MD, MPH PGY-1,  [facility] 05/26/2014 10:56 AM

## 2014-05-26 NOTE — Telephone Encounter (Signed)
Patient informed, expressed understanding. 

## 2014-06-13 ENCOUNTER — Ambulatory Visit (INDEPENDENT_AMBULATORY_CARE_PROVIDER_SITE_OTHER): Payer: Medicare Other | Admitting: Family Medicine

## 2014-06-13 VITALS — BP 139/92 | HR 61 | Temp 98.3°F | Ht 68.0 in | Wt 205.0 lb

## 2014-06-13 DIAGNOSIS — R197 Diarrhea, unspecified: Secondary | ICD-10-CM

## 2014-06-13 DIAGNOSIS — M545 Low back pain, unspecified: Secondary | ICD-10-CM

## 2014-06-13 DIAGNOSIS — G8929 Other chronic pain: Secondary | ICD-10-CM | POA: Diagnosis not present

## 2014-06-13 DIAGNOSIS — I10 Essential (primary) hypertension: Secondary | ICD-10-CM

## 2014-06-13 DIAGNOSIS — G4700 Insomnia, unspecified: Secondary | ICD-10-CM | POA: Diagnosis not present

## 2014-06-13 MED ORDER — LISINOPRIL 20 MG PO TABS: 20.0000 mg | ORAL_TABLET | Freq: Every day | ORAL | Status: DC

## 2014-06-13 MED ORDER — CYCLOBENZAPRINE HCL 10 MG PO TABS: ORAL_TABLET | ORAL | Status: DC

## 2014-06-13 MED ORDER — ZOLPIDEM TARTRATE 5 MG PO TABS: 5.0000 mg | ORAL_TABLET | Freq: Every evening | ORAL | Status: DC | PRN

## 2014-06-13 MED ORDER — CALCIUM POLYCARBOPHIL 625 MG PO TABS: 625.0000 mg | ORAL_TABLET | Freq: Every day | ORAL | Status: DC

## 2014-06-13 NOTE — Assessment & Plan Note (Signed)
Currently above goal Continue metoprolol 25 mg twice a day We'll increase lisinopril dose to 40 mg daily - continue lisinopril HCTZ 20-25 mg daily and start lisinopril 20 mg daily in addition Follow-up in one month

## 2014-06-13 NOTE — Assessment & Plan Note (Signed)
>>  ASSESSMENT AND PLAN FOR HYPERTENSION, BENIGN ESSENTIAL WRITTEN ON 06/13/2014 11:52 AM BY Mazie Speed, MD  Currently above goal Continue metoprolol  25 mg twice a day We'll increase lisinopril  dose to 40 mg daily - continue lisinopril  HCTZ 20-25 mg daily and start lisinopril  20 mg daily in addition Follow-up in one month

## 2014-06-13 NOTE — Patient Instructions (Signed)
It was nice to see you again today. I think that your diarrhea is related to having your gallbladder taken out. We will start a fiber pill daily. The medication that you were taking in liquid form is no longer available as a pill.  Stop taking the nortriptyline as it was making you too groggy and not alert. We will not start a medicine for the back pain at this time.  For your blood pressure, we will increase your lisinopril dose to 40 mg daily. This means continuing to take the 2 pills you currently take your blood pressure and we will add another 20 mg of lisinopril as a separate pill.  Take care, Dr. B  Hypertension Hypertension, commonly called high blood pressure, is when the force of blood pumping through your arteries is too strong. Your arteries are the blood vessels that carry blood from your heart throughout your body. A blood pressure reading consists of a higher number over a lower number, such as 110/72. The higher number (systolic) is the pressure inside your arteries when your heart pumps. The lower number (diastolic) is the pressure inside your arteries when your heart relaxes. Ideally you want your blood pressure below 120/80. Hypertension forces your heart to work harder to pump blood. Your arteries may become narrow or stiff. Having hypertension puts you at risk for heart disease, stroke, and other problems.  RISK FACTORS Some risk factors for high blood pressure are controllable. Others are not.  Risk factors you cannot control include:   Race. You may be at higher risk if you are African American.  Age. Risk increases with age.  Gender. Men are at higher risk than women before age 56 years. After age 23, women are at higher risk than men. Risk factors you can control include:  Not getting enough exercise or physical activity.  Being overweight.  Getting too much fat, sugar, calories, or salt in your diet.  Drinking too much alcohol. SIGNS AND SYMPTOMS Hypertension  does not usually cause signs or symptoms. Extremely high blood pressure (hypertensive crisis) may cause headache, anxiety, shortness of breath, and nosebleed. DIAGNOSIS  To check if you have hypertension, your health care provider will measure your blood pressure while you are seated, with your arm held at the level of your heart. It should be measured at least twice using the same arm. Certain conditions can cause a difference in blood pressure between your right and left arms. A blood pressure reading that is higher than normal on one occasion does not mean that you need treatment. If one blood pressure reading is high, ask your health care provider about having it checked again. TREATMENT  Treating high blood pressure includes making lifestyle changes and possibly taking medicine. Living a healthy lifestyle can help lower high blood pressure. You may need to change some of your habits. Lifestyle changes may include:  Following the DASH diet. This diet is high in fruits, vegetables, and whole grains. It is low in salt, red meat, and added sugars.  Getting at least 2 hours of brisk physical activity every week.  Losing weight if necessary.  Not smoking.  Limiting alcoholic beverages.  Learning ways to reduce stress. If lifestyle changes are not enough to get your blood pressure under control, your health care provider may prescribe medicine. You may need to take more than one. Work closely with your health care provider to understand the risks and benefits. HOME CARE INSTRUCTIONS  Have your blood pressure rechecked as directed  by your health care provider.   Take medicines only as directed by your health care provider. Follow the directions carefully. Blood pressure medicines must be taken as prescribed. The medicine does not work as well when you skip doses. Skipping doses also puts you at risk for problems.   Do not smoke.   Monitor your blood pressure at home as directed by your  health care provider. SEEK MEDICAL CARE IF:   You think you are having a reaction to medicines taken.  You have recurrent headaches or feel dizzy.  You have swelling in your ankles.  You have trouble with your vision. SEEK IMMEDIATE MEDICAL CARE IF:  You develop a severe headache or confusion.  You have unusual weakness, numbness, or feel faint.  You have severe chest or abdominal pain.  You vomit repeatedly.  You have trouble breathing. MAKE SURE YOU:   Understand these instructions.  Will watch your condition.  Will get help right away if you are not doing well or get worse. Document Released: 03/11/2005 Document Revised: 07/26/2013 Document Reviewed: 01/01/2013 [facility] Patient Information 2015 [place], [place]. This information is not intended to replace advice given to you by your health care provider. Make sure you discuss any questions you have with your health care provider.

## 2014-06-13 NOTE — Assessment & Plan Note (Signed)
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

## 2014-06-13 NOTE — Assessment & Plan Note (Signed)
Ongoing back pain We will re-request records from orthopedics Continue Flexeril, but will try to wean next visit Stop nortriptyline as patient did not tolerate
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
## 2014-07-07 ENCOUNTER — Ambulatory Visit: Payer: Medicare Other | Admitting: Family Medicine

## 2014-07-23 ENCOUNTER — Emergency Department (HOSPITAL_COMMUNITY)
Admission: EM | Admit: 2014-07-23 | Discharge: 2014-07-23 | Disposition: A | Discharge status: Home / Self Care | Payer: No Typology Code available for payment source | Attending: Emergency Medicine | Admitting: Emergency Medicine

## 2014-07-23 ENCOUNTER — Emergency Department (HOSPITAL_COMMUNITY): Payer: No Typology Code available for payment source

## 2014-07-23 ENCOUNTER — Encounter (HOSPITAL_COMMUNITY): Payer: Self-pay | Admitting: Emergency Medicine

## 2014-07-23 DIAGNOSIS — Z8701 Personal history of pneumonia (recurrent): Secondary | ICD-10-CM | POA: Diagnosis not present

## 2014-07-23 DIAGNOSIS — K219 Gastro-esophageal reflux disease without esophagitis: Secondary | ICD-10-CM | POA: Insufficient documentation

## 2014-07-23 DIAGNOSIS — Y92410 Unspecified street and highway as the place of occurrence of the external cause: Secondary | ICD-10-CM | POA: Insufficient documentation

## 2014-07-23 DIAGNOSIS — Y998 Other external cause status: Secondary | ICD-10-CM | POA: Diagnosis not present

## 2014-07-23 DIAGNOSIS — S29092A Other injury of muscle and tendon of back wall of thorax, initial encounter: Secondary | ICD-10-CM | POA: Diagnosis not present

## 2014-07-23 DIAGNOSIS — M545 Low back pain, unspecified: Secondary | ICD-10-CM

## 2014-07-23 DIAGNOSIS — Z79899 Other long term (current) drug therapy: Secondary | ICD-10-CM | POA: Diagnosis not present

## 2014-07-23 DIAGNOSIS — M25551 Pain in right hip: Secondary | ICD-10-CM | POA: Diagnosis not present

## 2014-07-23 DIAGNOSIS — S299XXA Unspecified injury of thorax, initial encounter: Secondary | ICD-10-CM | POA: Diagnosis not present

## 2014-07-23 DIAGNOSIS — S79911A Unspecified injury of right hip, initial encounter: Secondary | ICD-10-CM | POA: Insufficient documentation

## 2014-07-23 DIAGNOSIS — S4992XA Unspecified injury of left shoulder and upper arm, initial encounter: Secondary | ICD-10-CM | POA: Diagnosis not present

## 2014-07-23 DIAGNOSIS — M25559 Pain in unspecified hip: Secondary | ICD-10-CM

## 2014-07-23 DIAGNOSIS — M25512 Pain in left shoulder: Secondary | ICD-10-CM | POA: Diagnosis not present

## 2014-07-23 DIAGNOSIS — Y9389 Activity, other specified: Secondary | ICD-10-CM | POA: Diagnosis not present

## 2014-07-23 DIAGNOSIS — M546 Pain in thoracic spine: Secondary | ICD-10-CM | POA: Diagnosis not present

## 2014-07-23 DIAGNOSIS — M170 Bilateral primary osteoarthritis of knee: Secondary | ICD-10-CM | POA: Diagnosis not present

## 2014-07-23 DIAGNOSIS — M549 Dorsalgia, unspecified: Secondary | ICD-10-CM

## 2014-07-23 DIAGNOSIS — Z8742 Personal history of other diseases of the female genital tract: Secondary | ICD-10-CM | POA: Diagnosis not present

## 2014-07-23 DIAGNOSIS — S79912A Unspecified injury of left hip, initial encounter: Secondary | ICD-10-CM | POA: Diagnosis not present

## 2014-07-23 DIAGNOSIS — I2510 Atherosclerotic heart disease of native coronary artery without angina pectoris: Secondary | ICD-10-CM | POA: Insufficient documentation

## 2014-07-23 DIAGNOSIS — Z87891 Personal history of nicotine dependence: Secondary | ICD-10-CM | POA: Insufficient documentation

## 2014-07-23 DIAGNOSIS — G8929 Other chronic pain: Secondary | ICD-10-CM | POA: Insufficient documentation

## 2014-07-23 DIAGNOSIS — M542 Cervicalgia: Secondary | ICD-10-CM

## 2014-07-23 DIAGNOSIS — S3992XA Unspecified injury of lower back, initial encounter: Secondary | ICD-10-CM | POA: Insufficient documentation

## 2014-07-23 DIAGNOSIS — F419 Anxiety disorder, unspecified: Secondary | ICD-10-CM | POA: Insufficient documentation

## 2014-07-23 DIAGNOSIS — I10 Essential (primary) hypertension: Secondary | ICD-10-CM | POA: Diagnosis not present

## 2014-07-23 DIAGNOSIS — F329 Major depressive disorder, single episode, unspecified: Secondary | ICD-10-CM | POA: Insufficient documentation

## 2014-07-23 DIAGNOSIS — M25552 Pain in left hip: Secondary | ICD-10-CM | POA: Diagnosis not present

## 2014-07-23 DIAGNOSIS — E669 Obesity, unspecified: Secondary | ICD-10-CM | POA: Diagnosis not present

## 2014-07-23 DIAGNOSIS — S199XXA Unspecified injury of neck, initial encounter: Secondary | ICD-10-CM | POA: Diagnosis not present

## 2014-07-23 MED ORDER — NAPROXEN 500 MG PO TABS: 500.0000 mg | ORAL_TABLET | Freq: Two times a day (BID) | ORAL | Status: DC

## 2014-07-23 MED ORDER — METHOCARBAMOL 500 MG PO TABS
500.0000 mg | ORAL_TABLET | Freq: Two times a day (BID) | ORAL | Status: DC
Start: 2014-07-23 — End: 2014-08-05

## 2014-07-23 MED ORDER — FENTANYL CITRATE (PF) 100 MCG/2ML IJ SOLN
50.0000 ug | Freq: Once | INTRAMUSCULAR | Status: AC
Start: 2014-07-23 — End: 2014-07-23
  Administered 2014-07-23: 50 ug via INTRAMUSCULAR
  Filled 2014-07-23: qty 2

## 2014-07-23 MED ORDER — TRAMADOL HCL 50 MG PO TABS: 50.0000 mg | ORAL_TABLET | Freq: Four times a day (QID) | ORAL | Status: DC | PRN

## 2014-07-23 MED ORDER — PROMETHAZINE HCL 25 MG/ML IJ SOLN
12.5000 mg | Freq: Once | INTRAMUSCULAR | Status: AC
  Administered 2014-07-23: 12.5 mg via INTRAMUSCULAR
  Filled 2014-07-23: qty 1
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
## 2014-07-29 ENCOUNTER — Ambulatory Visit: Payer: Medicare Other | Admitting: Family Medicine

## 2014-07-29 ENCOUNTER — Telehealth: Payer: Self-pay | Admitting: Family Medicine
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
## 2014-08-05 ENCOUNTER — Encounter: Payer: Self-pay | Admitting: Family Medicine

## 2014-08-05 ENCOUNTER — Ambulatory Visit (INDEPENDENT_AMBULATORY_CARE_PROVIDER_SITE_OTHER): Payer: Medicare Other | Admitting: Family Medicine

## 2014-08-05 VITALS — BP 120/85 | HR 68 | Temp 98.4°F | Ht 68.0 in | Wt 209.4 lb

## 2014-08-05 DIAGNOSIS — M545 Low back pain, unspecified: Secondary | ICD-10-CM

## 2014-08-05 DIAGNOSIS — G8929 Other chronic pain: Secondary | ICD-10-CM | POA: Diagnosis not present

## 2014-08-05 DIAGNOSIS — F411 Generalized anxiety disorder: Secondary | ICD-10-CM | POA: Diagnosis not present

## 2014-08-05 DIAGNOSIS — K219 Gastro-esophageal reflux disease without esophagitis: Secondary | ICD-10-CM | POA: Diagnosis not present

## 2014-08-05 DIAGNOSIS — I10 Essential (primary) hypertension: Secondary | ICD-10-CM | POA: Diagnosis not present

## 2014-08-05 MED ORDER — KETOROLAC TROMETHAMINE 30 MG/ML IJ SOLN: 30.0000 mg | Freq: Once | INTRAMUSCULAR | Status: AC

## 2014-08-05 MED ORDER — CLONAZEPAM 1 MG PO TABS: 1.0000 mg | ORAL_TABLET | Freq: Two times a day (BID) | ORAL | Status: DC | PRN

## 2014-08-05 MED ORDER — CLONAZEPAM 1 MG PO TABS
1.0000 mg | ORAL_TABLET | Freq: Two times a day (BID) | ORAL | Status: DC | PRN
Start: 2014-08-05 — End: 2014-08-11

## 2014-08-05 MED ORDER — KETOROLAC TROMETHAMINE 30 MG/ML IJ SOLN
30.0000 mg | Freq: Once | INTRAMUSCULAR | Status: AC
  Administered 2014-08-05: 30 mg via INTRAMUSCULAR

## 2014-08-05 MED ORDER — PANTOPRAZOLE SODIUM 40 MG PO TBEC: 40.0000 mg | DELAYED_RELEASE_TABLET | Freq: Every day | ORAL | Status: DC

## 2014-08-05 MED ORDER — CYCLOBENZAPRINE HCL 10 MG PO TABS: ORAL_TABLET | ORAL | Status: DC

## 2014-08-05 NOTE — Assessment & Plan Note (Signed)
Currently well controlled on manual recheck Continue metoprolol 25 mg BID, lisinopril-HCTZ 40-25 mg daily F/u in 3 months
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
## 2014-08-05 NOTE — Assessment & Plan Note (Signed)
>>  ASSESSMENT AND PLAN FOR HYPERTENSION, BENIGN ESSENTIAL WRITTEN ON 08/05/2014  2:44 PM BY Mazie Speed, MD  Currently well controlled on manual recheck Continue metoprolol  25 mg BID, lisinopril -HCTZ 40-25 mg daily F/u in 3 months
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
## 2014-08-05 NOTE — Assessment & Plan Note (Signed)
Refill Protonix. 

## 2014-08-05 NOTE — Assessment & Plan Note (Addendum)
Continue Klonopin 1 mg twice a day when necessary Would like to consider BuSpar or SSRI in the future Patient currently very hesitant about trying new medications Follow-up in 3 months
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
## 2014-08-06 DIAGNOSIS — M5136 Other intervertebral disc degeneration, lumbar region: Secondary | ICD-10-CM | POA: Diagnosis not present

## 2014-08-06 DIAGNOSIS — M5416 Radiculopathy, lumbar region: Secondary | ICD-10-CM | POA: Diagnosis not present

## 2014-08-06 DIAGNOSIS — S335XXA Sprain of ligaments of lumbar spine, initial encounter: Secondary | ICD-10-CM | POA: Diagnosis not present

## 2014-08-06 DIAGNOSIS — S134XXA Sprain of ligaments of cervical spine, initial encounter: Secondary | ICD-10-CM | POA: Diagnosis not present

## 2014-08-11 ENCOUNTER — Emergency Department (HOSPITAL_COMMUNITY)
Admission: EM | Admit: 2014-08-11 | Discharge: 2014-08-11 | Disposition: A | Discharge status: Home / Self Care | Payer: Medicare Other | Attending: Emergency Medicine | Admitting: Emergency Medicine

## 2014-08-11 ENCOUNTER — Emergency Department (HOSPITAL_COMMUNITY): Payer: Medicare Other

## 2014-08-11 ENCOUNTER — Encounter (HOSPITAL_COMMUNITY): Payer: Self-pay | Admitting: Emergency Medicine

## 2014-08-11 DIAGNOSIS — E669 Obesity, unspecified: Secondary | ICD-10-CM | POA: Insufficient documentation

## 2014-08-11 DIAGNOSIS — R1110 Vomiting, unspecified: Secondary | ICD-10-CM | POA: Diagnosis not present

## 2014-08-11 DIAGNOSIS — R1084 Generalized abdominal pain: Secondary | ICD-10-CM | POA: Diagnosis not present

## 2014-08-11 DIAGNOSIS — R197 Diarrhea, unspecified: Secondary | ICD-10-CM | POA: Diagnosis not present

## 2014-08-11 DIAGNOSIS — Z8701 Personal history of pneumonia (recurrent): Secondary | ICD-10-CM | POA: Diagnosis not present

## 2014-08-11 DIAGNOSIS — R112 Nausea with vomiting, unspecified: Secondary | ICD-10-CM | POA: Diagnosis not present

## 2014-08-11 DIAGNOSIS — I10 Essential (primary) hypertension: Secondary | ICD-10-CM | POA: Insufficient documentation

## 2014-08-11 DIAGNOSIS — R1031 Right lower quadrant pain: Secondary | ICD-10-CM | POA: Diagnosis not present

## 2014-08-11 DIAGNOSIS — F419 Anxiety disorder, unspecified: Secondary | ICD-10-CM | POA: Insufficient documentation

## 2014-08-11 DIAGNOSIS — E663 Overweight: Secondary | ICD-10-CM | POA: Diagnosis not present

## 2014-08-11 DIAGNOSIS — Z791 Long term (current) use of non-steroidal anti-inflammatories (NSAID): Secondary | ICD-10-CM | POA: Diagnosis not present

## 2014-08-11 DIAGNOSIS — Z79899 Other long term (current) drug therapy: Secondary | ICD-10-CM | POA: Insufficient documentation

## 2014-08-11 DIAGNOSIS — Z8742 Personal history of other diseases of the female genital tract: Secondary | ICD-10-CM | POA: Diagnosis not present

## 2014-08-11 DIAGNOSIS — K219 Gastro-esophageal reflux disease without esophagitis: Secondary | ICD-10-CM | POA: Diagnosis not present

## 2014-08-11 DIAGNOSIS — I2510 Atherosclerotic heart disease of native coronary artery without angina pectoris: Secondary | ICD-10-CM | POA: Diagnosis not present

## 2014-08-11 DIAGNOSIS — G8929 Other chronic pain: Secondary | ICD-10-CM | POA: Insufficient documentation

## 2014-08-11 DIAGNOSIS — R109 Unspecified abdominal pain: Secondary | ICD-10-CM | POA: Diagnosis present

## 2014-08-11 LAB — COMPREHENSIVE METABOLIC PANEL
ALT: 25 U/L (ref 14–54)
AST: 27 U/L (ref 15–41)
Albumin: 4.7 g/dL (ref 3.5–5.0)
Alkaline Phosphatase: 72 U/L (ref 38–126)
Anion gap: 16 — ABNORMAL HIGH (ref 5–15)
BUN: 18 mg/dL (ref 6–20)
CO2: 23 mmol/L (ref 22–32)
Calcium: 9.4 mg/dL (ref 8.9–10.3)
Chloride: 101 mmol/L (ref 101–111)
Creatinine, Ser: 0.93 mg/dL (ref 0.44–1.00)
GFR calc Af Amer: >60 mL/min (ref >60)
GFR calc non Af Amer: >60 mL/min (ref >60)
Glucose, Bld: 109 mg/dL — ABNORMAL HIGH (ref 65–99)
Potassium: 3.4 mmol/L — ABNORMAL LOW (ref 3.5–5.1)
Sodium: 140 mmol/L (ref 135–145)
Total Bilirubin: 0.7 mg/dL (ref 0.3–1.2)
Total Protein: 8.2 g/dL — ABNORMAL HIGH (ref 6.5–8.1)

## 2014-08-11 LAB — CBC WITH DIFFERENTIAL/PLATELET
Basophils Absolute: 0 10*3/uL (ref 0.0–0.1)
Basophils Relative: 0 % (ref 0–1)
Eosinophils Absolute: 0 10*3/uL (ref 0.0–0.7)
Eosinophils Relative: 0 % (ref 0–5)
HCT: 43.2 % (ref 36.0–46.0)
Hemoglobin: 14.1 g/dL (ref 12.0–15.0)
Lymphocytes Relative: 18 % (ref 12–46)
Lymphs Abs: 3.1 10*3/uL (ref 0.7–4.0)
MCH: 28.8 pg (ref 26.0–34.0)
MCHC: 32.6 g/dL (ref 30.0–36.0)
MCV: 88.2 fL (ref 78.0–100.0)
Monocytes Absolute: 0.6 10*3/uL (ref 0.1–1.0)
Monocytes Relative: 3 % (ref 3–12)
Neutro Abs: 13.7 10*3/uL — ABNORMAL HIGH (ref 1.7–7.7)
Neutrophils Relative %: 79 % — ABNORMAL HIGH (ref 43–77)
Platelets: 231 10*3/uL (ref 150–400)
RBC: 4.9 MIL/uL (ref 3.87–5.11)
RDW: 15.7 % — ABNORMAL HIGH (ref 11.5–15.5)
WBC: 17.4 10*3/uL — ABNORMAL HIGH (ref 4.0–10.5)

## 2014-08-11 LAB — URINALYSIS, ROUTINE W REFLEX MICROSCOPIC
Bilirubin Urine: NEGATIVE
Glucose, UA: NEGATIVE mg/dL
Hgb urine dipstick: NEGATIVE
Ketones, ur: NEGATIVE mg/dL
Leukocytes, UA: NEGATIVE
Nitrite: NEGATIVE
Protein, ur: 30 mg/dL — AB
Specific Gravity, Urine: 1.018 (ref 1.005–1.030)
Urobilinogen, UA: 0.2 mg/dL (ref 0.0–1.0)
pH: 7 (ref 5.0–8.0)

## 2014-08-11 LAB — LIPASE, BLOOD: Lipase: 46 U/L (ref 22–51)

## 2014-08-11 LAB — URINE MICROSCOPIC-ADD ON

## 2014-08-11 MED ORDER — PROMETHAZINE HCL 25 MG PO TABS: 25.0000 mg | ORAL_TABLET | Freq: Four times a day (QID) | ORAL | Status: DC | PRN

## 2014-08-11 MED ORDER — SODIUM CHLORIDE 0.9 % IV BOLUS (SEPSIS)
1000.0000 mL | Freq: Once | INTRAVENOUS | Status: AC
  Administered 2014-08-11: 1000 mL via INTRAVENOUS

## 2014-08-11 MED ORDER — IOHEXOL 300 MG/ML  SOLN
50.0000 mL | Freq: Once | INTRAMUSCULAR | Status: AC | PRN
  Administered 2014-08-11: 50 mL via ORAL

## 2014-08-11 MED ORDER — HYDROMORPHONE HCL 1 MG/ML IJ SOLN
1.0000 mg | Freq: Once | INTRAMUSCULAR | Status: AC
  Administered 2014-08-11: 1 mg via INTRAVENOUS
  Filled 2014-08-11: qty 1

## 2014-08-11 MED ORDER — PROMETHAZINE HCL 25 MG RE SUPP: 25.0000 mg | Freq: Four times a day (QID) | RECTAL | Status: DC | PRN

## 2014-08-11 MED ORDER — PROMETHAZINE HCL 25 MG/ML IJ SOLN
25.0000 mg | Freq: Once | INTRAMUSCULAR | Status: AC
  Administered 2014-08-11: 25 mg via INTRAVENOUS
  Filled 2014-08-11: qty 1

## 2014-08-11 MED ORDER — IOHEXOL 300 MG/ML  SOLN
100.0000 mL | Freq: Once | INTRAMUSCULAR | Status: AC | PRN
  Administered 2014-08-11: 100 mL via INTRAVENOUS

## 2014-08-11 NOTE — ED Notes (Signed)
Pt c/o emesis, diarrhea, mid abdomen pain onset 0300 today. Patient's legs would not support her weight whilst ambulating back to triage, pt would have fallen to floor if not for staff assistance, pt did not fall, pt did not lose consciousness.

## 2014-08-11 NOTE — ED Provider Notes (Addendum)
CSN: [id number]     Arrival date & time 08/11/14  1710 History   First MD Initiated Contact with Patient 08/11/14 1753     Chief Complaint  Patient presents with  . Emesis  . Diarrhea  . Abdominal Pain     (Consider location/radiation/quality/duration/timing/severity/associated sxs/prior Treatment) The history is provided by the patient.  Shannon Stuart is a 54 y.o. female hx of depression, HL, GERD here with abdominal pain and vomiting and diarrhea. Patient has been having profuse vomiting and 3 AM today. She states that she vomited up everything and now is vomiting up stomach acid. She has numerous episodes of watery diarrhea. She says she feels so dehydrated that is hard for her to walk. Denies any back pain. She denies any urinary symptoms or fevers.    Past Medical History  Diagnosis Date  . Allergy   . Depression   . Hyperlipidemia     a. 07/2013 LDL 126 - not on statin.  Marland Kitchen Hypertension   . Endometriosis   . GERD (gastroesophageal reflux disease)   . Obesity, Class II, BMI 35-39.9   . History of pneumonia   . Anxiety     a. takes mostly daily klonopin  . Headache(784.0)   . Osteoarthritis     a. bilateral knees.  . Chest pain at rest   . Chronic low back pain   . Non-obstructive CAD     a. Cath 2009 - minimal CAD;  b. 10/2011 Lexiscan Myoview: No ischemia or infarct.  . Biliary dyskinesia     a. 09/2013 s/p Lap Chole ([place]).   Past Surgical History  Procedure Laterality Date  . Brain tumor excision    . Lumbar disc surgery  04/1986, 12/1986    x 2  . Tissue graft    . Tonsillectomy    . Cholecystectomy N/A 10/21/2013    Procedure: LAPAROSCOPIC CHOLECYSTECTOMY WITH INTRAOPERATIVE CHOLANGIOGRAM;  Surgeon: Imogene Burn. Georgette Dover, MD;  Location: [facility];  Service: General;  Laterality: N/A;   Family History  Problem Relation Age of Onset  . Diabetes Maternal Grandmother   . Heart failure Maternal Grandmother     CHF  . Diabetes Sister   . Diabetes Sister     gestational   . Heart disease Mother 52    MI  . Hypertension Other     entire family  . Colon cancer Neg Hx   . Esophageal cancer Neg Hx   . Stomach cancer Neg Hx    History  Substance Use Topics  . Smoking status: Former Smoker    Types: Cigarettes    Quit date: 03/26/1991  . Smokeless tobacco: Never Used  . Alcohol Use: Yes     Comment: 'SOCIALLY"   OB History    Gravida Para Term Preterm AB TAB SAB Ectopic Multiple Living   1 1 1       1      Review of Systems  Gastrointestinal: Positive for vomiting, abdominal pain and diarrhea.  All other systems reviewed and are negative.     Allergies  Zofran  Home Medications   Prior to Admission medications   Medication Sig Start Date End Date Taking? Authorizing Provider  Chromium-Cinnamon (CINNAMON PLUS CHROMIUM) 100-500 MCG-MG CAPS Take by mouth every morning.   Yes Historical Provider, MD  clonazePAM (KLONOPIN) 1 MG tablet Take 1 tablet (1 mg total) by mouth 2 (two) times daily as needed for anxiety. 08/05/14  Yes Virginia Crews, MD  cyclobenzaprine (FLEXERIL) 10  MG tablet TAKE 1 TABLET (10 MG TOTAL) BY MOUTH 3 (THREE) TIMES DAILY AS NEEDED FOR MUSCLE SPASMS. 08/05/14  Yes Virginia Crews, MD  Ginkgo Biloba 200 MG CAPS Take by mouth every morning.   Yes Historical Provider, MD  ibuprofen (ADVIL,MOTRIN) 800 MG tablet Take 1 tablet (800 mg total) by mouth every 8 (eight) hours as needed for moderate pain. 05/25/14  Yes Virginia Crews, MD  lisinopril (PRINIVIL,ZESTRIL) 20 MG tablet Take 1 tablet (20 mg total) by mouth daily. 12/18/10 08/11/14 Yes [place], DO  lisinopril-hydrochlorothiazide (PRINZIDE,ZESTORETIC) 20-25 MG per tablet Take 1 tablet by mouth daily. 01/03/14 04/05/15 Yes Virginia Crews, MD  medroxyPROGESTERone (DEPO-PROVERA) 150 MG/ML injection Inject 1 mL (150 mg total) into the muscle every 3 (three) months. 03/04/12  Yes [place], DO  metoprolol tartrate (LOPRESSOR) 25 MG tablet Take 1 tablet (25 mg  total) by mouth 2 (two) times daily. 04/08/13  Yes Kandis Nab, MD  nitroGLYCERIN (NITROSTAT) 0.4 MG SL tablet Place 0.4 mg under the tongue every 5 (five) minutes as needed for chest pain.   Yes Historical Provider, MD  pantoprazole (PROTONIX) 40 MG tablet Take 1 tablet (40 mg total) by mouth daily. 08/05/14  Yes Virginia Crews, MD  promethazine (PHENERGAN) 25 MG suppository Place 1 suppository (25 mg total) rectally every 6 (six) hours as needed for nausea or vomiting. 04/02/13  Yes Harvie Heck, PA-C  zolpidem (AMBIEN) 5 MG tablet Take 1 tablet (5 mg total) by mouth at bedtime as needed for sleep. 06/13/14  Yes Virginia Crews, MD  cholestyramine Lucrezia Starch) 4 G packet Take 1 packet (4 g total) by mouth 3 (three) times daily with meals. Patient not taking: Reported on 08/11/2014 11/15/13   Donnie Mesa, MD  naproxen (NAPROSYN) 500 MG tablet Take 1 tablet (500 mg total) by mouth 2 (two) times daily. Patient not taking: Reported on 08/11/2014 07/23/14   Hyman Bible, PA-C  nortriptyline (PAMELOR) 50 MG capsule Take 1 capsule (50 mg total) by mouth at bedtime. Patient not taking: Reported on 08/11/2014 05/25/14   Virginia Crews, MD  polycarbophil (FIBERCON) 625 MG tablet Take 1 tablet (625 mg total) by mouth daily. Patient not taking: Reported on 08/11/2014 06/13/14   Virginia Crews, MD  traMADol (ULTRAM) 50 MG tablet Take 1 tablet (50 mg total) by mouth every 6 (six) hours as needed. Patient not taking: Reported on 08/11/2014 07/23/14   Heather Laisure, PA-C   BP 166/83 mmHg  Pulse 49  Temp(Src) 97.8 F (36.6 C) (Oral)  Resp 20  SpO2 97%  LMP  Physical Exam  Constitutional: She is oriented to person, place, and time.  Uncomfortable, dehydrated   HENT:  Head: Normocephalic.  MM dry   Eyes: Conjunctivae are normal. Pupils are equal, round, and reactive to light.  Neck: Normal range of motion. Neck supple.  Cardiovascular: Normal rate, regular rhythm and normal heart sounds.    Pulmonary/Chest: Effort normal and breath sounds normal. No respiratory distress. She has no wheezes. She has no rales.  Abdominal: Soft. Bowel sounds are normal.  Overweight, mild diffuse tenderness, worse in RLQ. No CVAT   Musculoskeletal: Normal range of motion.  No midline spinal tenderness.   Neurological: She is alert and oriented to person, place, and time.  Strength 4/5 bilateral legs. No saddle anesthesia   Skin: Skin is warm and dry.  Psychiatric: She has a normal mood and affect. Her behavior is normal. Judgment  and thought content normal.  Nursing note and vitals reviewed.   ED Course  Procedures (including critical care time)  Angiocath insertion Performed by: Darl Householder, Carlito Bogert  Consent: Verbal consent obtained. Risks and benefits: risks, benefits and alternatives were discussed Time out: Immediately prior to procedure a "time out" was called to verify the correct patient, procedure, equipment, support staff and site/side marked as required.  Preparation: Patient was prepped and draped in the usual sterile fashion.  Vein Location: R brachial  Ultrasound Guided  Gauge: 20 long   Normal blood return and flush without difficulty Patient tolerance: Patient tolerated the procedure well with no immediate complications.     Labs Review Labs Reviewed  CBC WITH DIFFERENTIAL/PLATELET - Abnormal; Notable for the following:    WBC 17.4 (*)    RDW 15.7 (*)    Neutrophils Relative % 79 (*)    Neutro Abs 13.7 (*)    All other components within normal limits  COMPREHENSIVE METABOLIC PANEL - Abnormal; Notable for the following:    Potassium 3.4 (*)    Glucose, Bld 109 (*)    Total Protein 8.2 (*)    Anion gap 16 (*)    All other components within normal limits  URINALYSIS, ROUTINE W REFLEX MICROSCOPIC - Abnormal; Notable for the following:    Protein, ur 30 (*)    All other components within normal limits  URINE MICROSCOPIC-ADD ON - Abnormal; Notable for the following:     Squamous Epithelial / LPF FEW (*)    All other components within normal limits  LIPASE, BLOOD    Imaging Review Ct Abdomen Pelvis W Contrast  08/11/2014   CLINICAL DATA:  Emesis, diarrhea, mid abdominal pain  EXAM: CT ABDOMEN AND PELVIS WITH CONTRAST  TECHNIQUE: Multidetector CT imaging of the abdomen and pelvis was performed using the standard protocol following bolus administration of intravenous contrast.  CONTRAST:  181mL OMNIPAQUE IOHEXOL 300 MG/ML SOLN, 59mL OMNIPAQUE IOHEXOL 300 MG/ML SOLN  COMPARISON:  CT abdomen pelvis - 08/31/2013; 07/15/2011  FINDINGS: Normal hepatic contour. Scattered ill-defined sub cm hypo attenuating lesions within the dome of the left lobe of the liver are too small to accurately characterize though appear morphologically similar to remote abdominal CT performed 06/2011 and favored to represent hepatic cysts. There is a minimal amount of focal fatty infiltration adjacent to the fissure for ligamentum teres. Post cholecystectomy. No intra or extrahepatic biliary duct dilatation. No ascites.  There is symmetric enhancement and excretion of the bilateral kidneys. 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 No definite renal stones this postcontrast examination. Bilateral subcentimeter hypoattenuating renal lesions too small to adequately characterize of favored to represent renal cysts. No urinary obstruction or perinephric stranding. Normal appearance of the bilateral adrenal glands, pancreas and spleen.  The bowel is normal in course and caliber without wall thickening or evidence of obstruction normal appearance of the appendix. No pneumoperitoneum, pneumatosis or portal venous gas.  Minimal amount of noncalcified atherosclerotic plaque within the normal caliber of the abdominal aorta. The major branch vessels of the abdominal aorta appear widely patent on this non CTA examination. No bulky retroperitoneal, mesenteric, pelvic or inguinal  lymphadenopathy.  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 No discrete adnexal lesion. There is a minimal amount of free presumably physiologic fluid within the pelvic  cul-de-sac. Normal appearance of the urinary bladder given degree distention. Several phleboliths are seen within the lower pelvis bilaterally, left greater than right.  Limited visualization of the lower thorax demonstrates minimal dependent subpleural ground-glass atelectasis, left greater than right. No discrete focal airspace opacities. No pleural effusion.  Borderline cardiomegaly.  No pericardial effusion.  No acute or aggressive osseous abnormalities. Moderate severe DDD of the lower lumbar spine, worse at L4-L5 and L5-S1 with disc space height loss, endplate irregularity and sclerosis.  Incidental note is made of an approximately 5.4 x 2.8 cm intramuscular lipoma within the left gluteal musculature (image 75, series 2). Note is made of a tiny mesenteric fat containing periumbilical hernia.  IMPRESSION: 1. No definite explanation for patient's emesis, diarrhea and mid abdominal pain. Specifically, no evidence of enteric or urinary obstruction. Normal appearance of the appendix. 2. 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 3. 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 Again, there is no evidence of urinary obstruction.   Electronically Signed   By: Sandi Mariscal M.D.   On: 08/11/2014 21:02     EKG Interpretation None      MDM   Final diagnoses:  Abdominal pain    Shannon Stuart is a 54 y.o. female here with ab pain, vomiting. I think likely gastro. Weakness likely from dehydration.  She has no neuro deficits to need MRI. Will get labs, CT ab/pel, UA.   10:14 PM WBC 17. CT showed fibroids, which is chronic. UA nl. Tolerated PO fluids. Will dc home with phenergan prn. Likely gastro.     Wandra Arthurs, MD 08/11/14 2215  Wandra Arthurs, MD 08/11/14 2216

## 2014-08-11 NOTE — Discharge Instructions (Signed)
Stay hydrated.   Take phenergan as needed for nausea.  Follow up with your doctor.   Return to ER if you have severe pain, vomiting, dehydration, fever.

## 2014-08-16 ENCOUNTER — Ambulatory Visit (INDEPENDENT_AMBULATORY_CARE_PROVIDER_SITE_OTHER): Payer: Medicare Other | Admitting: *Deleted

## 2014-08-16 DIAGNOSIS — N938 Other specified abnormal uterine and vaginal bleeding: Secondary | ICD-10-CM

## 2014-08-16 MED ORDER — MEDROXYPROGESTERONE ACETATE 150 MG/ML IM SUSP
150.0000 mg | Freq: Once | INTRAMUSCULAR | Status: AC
  Administered 2014-08-16: 150 mg via INTRAMUSCULAR

## 2014-08-16 NOTE — Progress Notes (Signed)
   Pt in for Depo Provera injection.  Pt tolerated Depo injection. Depo given right upper outer quadrant.  Next injection due August 4-22, 2016.  Reminder card given. [name], RN

## 2014-08-24 ENCOUNTER — Other Ambulatory Visit: Payer: Self-pay | Admitting: Family Medicine

## 2014-08-24 DIAGNOSIS — G4700 Insomnia, unspecified: Secondary | ICD-10-CM

## 2014-08-24 NOTE — Telephone Encounter (Signed)
Needs refill on Ambien  [facility] [address]

## 2014-08-25 MED ORDER — ZOLPIDEM TARTRATE 5 MG PO TABS: 5.0000 mg | ORAL_TABLET | Freq: Every evening | ORAL | Status: DC | PRN

## 2014-08-25 NOTE — Telephone Encounter (Signed)
Please let patient know that prescription is available for pick up at front desk.  Virginia Crews, MD, MPH PGY-1,  [facility] 08/25/2014 1:56 PM

## 2014-08-26 NOTE — Telephone Encounter (Signed)
Patient informed, expressed understanding. 

## 2014-08-30 DIAGNOSIS — M5416 Radiculopathy, lumbar region: Secondary | ICD-10-CM | POA: Diagnosis not present

## 2014-09-02 DIAGNOSIS — M5416 Radiculopathy, lumbar region: Secondary | ICD-10-CM | POA: Diagnosis not present

## 2014-09-06 DIAGNOSIS — M5416 Radiculopathy, lumbar region: Secondary | ICD-10-CM | POA: Diagnosis not present

## 2014-09-08 DIAGNOSIS — M5416 Radiculopathy, lumbar region: Secondary | ICD-10-CM | POA: Diagnosis not present

## 2014-09-13 DIAGNOSIS — M5416 Radiculopathy, lumbar region: Secondary | ICD-10-CM | POA: Diagnosis not present

## 2014-09-15 DIAGNOSIS — M5416 Radiculopathy, lumbar region: Secondary | ICD-10-CM | POA: Diagnosis not present

## 2014-09-20 DIAGNOSIS — M5416 Radiculopathy, lumbar region: Secondary | ICD-10-CM | POA: Diagnosis not present

## 2014-09-22 DIAGNOSIS — M5416 Radiculopathy, lumbar region: Secondary | ICD-10-CM | POA: Diagnosis not present

## 2014-09-29 ENCOUNTER — Telehealth: Payer: Self-pay | Admitting: Family Medicine

## 2014-09-29 DIAGNOSIS — M5416 Radiculopathy, lumbar region: Secondary | ICD-10-CM | POA: Diagnosis not present

## 2014-09-29 NOTE — Telephone Encounter (Signed)
Pt called to make an appt with pcp for another pain injection, pcp is booked up until 7/20 and the patient cannot wait that long. Wants to know if the nurse can do it? If not, we can offer to work the patient in with someone else.

## 2014-09-29 NOTE — Telephone Encounter (Signed)
Will forward to PCP to advise

## 2014-09-29 NOTE — Telephone Encounter (Signed)
It would be fine for patient to have RN visit to receive Toradol IM 30mg .  She typically gets one every other month. She will need to have appointment to get reflls on any controlled substances, however.  Can someone call her and set up appt? Thanks!  Virginia Crews, MD, MPH PGY-2,  [facility] 09/29/2014 8:22 PM

## 2014-09-30 NOTE — Telephone Encounter (Signed)
Attempted to call pt to schedule her for a nurse visit. LMOVM for her to call back and schedule. Donna Snooks Kennon Holter, CMA

## 2014-10-04 DIAGNOSIS — M5416 Radiculopathy, lumbar region: Secondary | ICD-10-CM | POA: Diagnosis not present

## 2014-10-04 NOTE — Telephone Encounter (Signed)
Pt scheduled for a nurse visit. Rayley Gao Kennon Holter, CMA

## 2014-10-05 ENCOUNTER — Ambulatory Visit (INDEPENDENT_AMBULATORY_CARE_PROVIDER_SITE_OTHER): Payer: Medicare Other | Admitting: *Deleted

## 2014-10-05 DIAGNOSIS — M545 Low back pain: Secondary | ICD-10-CM

## 2014-10-05 MED ORDER — KETOROLAC TROMETHAMINE 30 MG/ML IJ SOLN
30.0000 mg | Freq: Once | INTRAMUSCULAR | Status: AC
  Administered 2014-10-05: 30 mg via INTRAMUSCULAR

## 2014-10-05 NOTE — Progress Notes (Signed)
    Pt in nurse clinic for Toradol injection for back pain.  Verbal order given by Dr. Brita Romp to given Toradol 30 mg/1 ml x 1.  Injection given LUOQ.  Pt to follow up with PCP.  [name], RN

## 2014-10-07 DIAGNOSIS — M5416 Radiculopathy, lumbar region: Secondary | ICD-10-CM | POA: Diagnosis not present

## 2014-10-07 DIAGNOSIS — M5136 Other intervertebral disc degeneration, lumbar region: Secondary | ICD-10-CM | POA: Diagnosis not present

## 2014-10-12 ENCOUNTER — Telehealth: Payer: Self-pay | Admitting: Family Medicine

## 2014-10-12 NOTE — Telephone Encounter (Signed)
Pt informed that form is complete and ready for pick up.  [name], RN

## 2014-10-12 NOTE — Telephone Encounter (Signed)
Patient dropped off form to be filled out.  She attached a list of her medications that need to be written on the form.  Please call her when completed.

## 2014-10-12 NOTE — Telephone Encounter (Signed)
Clinical portion done, will place in PCP box for completion.

## 2014-10-12 NOTE — Telephone Encounter (Signed)
Form completed and placed in tamika's office.  Virginia Crews, MD, MPH PGY-2,  [facility] 10/12/2014 12:08 PM

## 2014-10-20 DIAGNOSIS — M545 Low back pain: Secondary | ICD-10-CM | POA: Diagnosis not present

## 2014-10-20 DIAGNOSIS — M129 Arthropathy, unspecified: Secondary | ICD-10-CM | POA: Diagnosis not present

## 2014-10-20 DIAGNOSIS — E669 Obesity, unspecified: Secondary | ICD-10-CM | POA: Diagnosis not present

## 2014-10-20 DIAGNOSIS — I159 Secondary hypertension, unspecified: Secondary | ICD-10-CM | POA: Diagnosis not present

## 2014-10-28 ENCOUNTER — Ambulatory Visit: Payer: Medicare Other

## 2014-10-31 ENCOUNTER — Telehealth: Payer: Self-pay | Admitting: Family Medicine

## 2014-10-31 NOTE — Telephone Encounter (Signed)
Patient usually gets Toradol IM 30mg  every other month.  It has only been one month since last injection.  Please let the patient know that she is not due for this yet.  Virginia Crews, MD, MPH PGY-2,  [facility] 10/31/2014 2:33 PM

## 2014-10-31 NOTE — Telephone Encounter (Signed)
Pt comes in tomorrow for her depo shot; would like to have a toradol shot as well if possible. Thank you, Fonda Kinder, ASA

## 2014-11-01 ENCOUNTER — Ambulatory Visit (INDEPENDENT_AMBULATORY_CARE_PROVIDER_SITE_OTHER): Payer: Medicare Other | Admitting: *Deleted

## 2014-11-01 DIAGNOSIS — N938 Other specified abnormal uterine and vaginal bleeding: Secondary | ICD-10-CM | POA: Diagnosis present

## 2014-11-01 MED ORDER — MEDROXYPROGESTERONE ACETATE 150 MG/ML IM SUSP
150.0000 mg | Freq: Once | INTRAMUSCULAR | Status: AC
  Administered 2014-11-01: 150 mg via INTRAMUSCULAR

## 2014-11-01 NOTE — Progress Notes (Signed)
   Pt in for Depo Provera injection.  Pt tolerated Depo injection. Depo given left upper outer quadrant.  Next injection due Oct 25-Jan 31, 2015.  Reminder card given. [name], RN

## 2014-11-07 ENCOUNTER — Ambulatory Visit: Payer: Medicare Other

## 2014-11-07 ENCOUNTER — Ambulatory Visit (INDEPENDENT_AMBULATORY_CARE_PROVIDER_SITE_OTHER): Payer: Medicare Other | Admitting: *Deleted

## 2014-11-07 DIAGNOSIS — G8929 Other chronic pain: Secondary | ICD-10-CM | POA: Diagnosis not present

## 2014-11-07 DIAGNOSIS — M545 Low back pain, unspecified: Secondary | ICD-10-CM

## 2014-11-07 MED ORDER — KETOROLAC TROMETHAMINE 30 MG/ML IJ SOLN
30.0000 mg | Freq: Once | INTRAMUSCULAR | Status: AC
  Administered 2014-11-07: 30 mg via INTRAMUSCULAR

## 2014-11-07 NOTE — Progress Notes (Signed)
   Pt in nurse clinic for Toradol injection.  Pt stated she has a lot of back pain.  Toradol 30 mg IM x 1 today per verbal order Dr. Brita Romp.  Patient advised to schedule an appointment with PCP to discuss pain management.  Patient stated understanding.  [name], RN

## 2014-11-21 DIAGNOSIS — E669 Obesity, unspecified: Secondary | ICD-10-CM | POA: Diagnosis not present

## 2014-11-21 DIAGNOSIS — M129 Arthropathy, unspecified: Secondary | ICD-10-CM | POA: Diagnosis not present

## 2014-11-21 DIAGNOSIS — M545 Low back pain: Secondary | ICD-10-CM | POA: Diagnosis not present

## 2014-11-21 DIAGNOSIS — I159 Secondary hypertension, unspecified: Secondary | ICD-10-CM | POA: Diagnosis not present

## 2014-12-02 ENCOUNTER — Other Ambulatory Visit: Payer: Self-pay | Admitting: Family Medicine

## 2014-12-02 ENCOUNTER — Ambulatory Visit (INDEPENDENT_AMBULATORY_CARE_PROVIDER_SITE_OTHER): Payer: Medicare Other | Admitting: Family Medicine

## 2014-12-02 VITALS — BP 134/91 | HR 62 | Temp 98.4°F | Ht 68.0 in | Wt 211.1 lb

## 2014-12-02 DIAGNOSIS — M545 Low back pain, unspecified: Secondary | ICD-10-CM

## 2014-12-02 DIAGNOSIS — G4700 Insomnia, unspecified: Secondary | ICD-10-CM

## 2014-12-02 DIAGNOSIS — K219 Gastro-esophageal reflux disease without esophagitis: Secondary | ICD-10-CM | POA: Diagnosis not present

## 2014-12-02 DIAGNOSIS — L723 Sebaceous cyst: Secondary | ICD-10-CM

## 2014-12-02 DIAGNOSIS — G8929 Other chronic pain: Secondary | ICD-10-CM

## 2014-12-02 DIAGNOSIS — F411 Generalized anxiety disorder: Secondary | ICD-10-CM

## 2014-12-02 MED ORDER — ZOLPIDEM TARTRATE 5 MG PO TABS: 5.0000 mg | ORAL_TABLET | Freq: Every evening | ORAL | Status: DC | PRN

## 2014-12-02 MED ORDER — PANTOPRAZOLE SODIUM 40 MG PO TBEC: 40.0000 mg | DELAYED_RELEASE_TABLET | Freq: Every day | ORAL | Status: DC

## 2014-12-02 NOTE — Assessment & Plan Note (Signed)
Offered alternative medications the patient refused Continue Ambien
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
## 2014-12-02 NOTE — Assessment & Plan Note (Signed)
Ongoing chronic back pain Followed by orthopedics Continue Flexeril Patient unwilling to try other medications or go to pain management clinic Explained to patient that I will not prescribe any narcotics including Fentanyl patches Follow-up in 3 months

## 2014-12-02 NOTE — Assessment & Plan Note (Signed)
Well-controlled Protonix refilled Follow-up as needed

## 2014-12-02 NOTE — Assessment & Plan Note (Signed)
Mixed anxiety and depression Attempted to start SSRI her Cymbalta but patient refused Patient reports she is to see a psychiatrist soon Not due for refill of Klonopin yet, but will refill next month

## 2014-12-02 NOTE — Patient Instructions (Addendum)
Nice to see you again today.  Continue with your current medications.  Come back to see me in 3 months to follow-up on these issues and before the end of the year for a physical and sooner if you have any other concerns.  Follow-up with the psychiatrist for depression and anxiety follow-up.  Take care, Dr. Jacinto Reap

## 2014-12-26 DIAGNOSIS — L723 Sebaceous cyst: Secondary | ICD-10-CM | POA: Diagnosis not present

## 2015-01-12 DIAGNOSIS — L723 Sebaceous cyst: Secondary | ICD-10-CM | POA: Diagnosis not present

## 2015-01-19 DIAGNOSIS — M545 Low back pain: Secondary | ICD-10-CM | POA: Diagnosis not present

## 2015-01-19 DIAGNOSIS — G501 Atypical facial pain: Secondary | ICD-10-CM | POA: Diagnosis not present

## 2015-01-19 DIAGNOSIS — H5710 Ocular pain, unspecified eye: Secondary | ICD-10-CM | POA: Diagnosis not present

## 2015-01-19 DIAGNOSIS — M129 Arthropathy, unspecified: Secondary | ICD-10-CM | POA: Diagnosis not present

## 2015-01-22 ENCOUNTER — Other Ambulatory Visit: Payer: Self-pay | Admitting: Family Medicine

## 2015-02-02 ENCOUNTER — Other Ambulatory Visit: Payer: Self-pay | Admitting: *Deleted

## 2015-02-02 DIAGNOSIS — I10 Essential (primary) hypertension: Secondary | ICD-10-CM

## 2015-02-02 MED ORDER — LISINOPRIL-HYDROCHLOROTHIAZIDE 20-25 MG PO TABS: 1.0000 | ORAL_TABLET | Freq: Every day | ORAL | Status: DC

## 2015-02-06 DIAGNOSIS — L723 Sebaceous cyst: Secondary | ICD-10-CM | POA: Diagnosis not present

## 2015-02-20 DIAGNOSIS — Z79891 Long term (current) use of opiate analgesic: Secondary | ICD-10-CM | POA: Diagnosis not present

## 2015-02-20 DIAGNOSIS — M545 Low back pain: Secondary | ICD-10-CM | POA: Diagnosis not present

## 2015-02-20 DIAGNOSIS — H5710 Ocular pain, unspecified eye: Secondary | ICD-10-CM | POA: Diagnosis not present

## 2015-02-20 DIAGNOSIS — G501 Atypical facial pain: Secondary | ICD-10-CM | POA: Diagnosis not present

## 2015-02-20 DIAGNOSIS — M129 Arthropathy, unspecified: Secondary | ICD-10-CM | POA: Diagnosis not present

## 2015-02-24 ENCOUNTER — Ambulatory Visit (INDEPENDENT_AMBULATORY_CARE_PROVIDER_SITE_OTHER): Payer: Medicare Other | Admitting: *Deleted

## 2015-02-24 DIAGNOSIS — Z23 Encounter for immunization: Secondary | ICD-10-CM | POA: Diagnosis not present

## 2015-02-24 DIAGNOSIS — N938 Other specified abnormal uterine and vaginal bleeding: Secondary | ICD-10-CM

## 2015-02-24 LAB — POCT URINE PREGNANCY: Preg Test, Ur: NEGATIVE

## 2015-02-24 MED ORDER — MEDROXYPROGESTERONE ACETATE 150 MG/ML IM SUSP
150.0000 mg | Freq: Once | INTRAMUSCULAR | Status: AC
  Administered 2015-02-24: 150 mg via INTRAMUSCULAR

## 2015-02-24 NOTE — Progress Notes (Signed)
   Pt late for Depo Provera injection.  Pregnancy test ordered; results negative. Pt tolerated Depo injection. Depo given right upper outer quadrant.  Next injection due Feb. 17-May 26, 2015.  Reminder card given. [name], RN

## 2015-03-13 ENCOUNTER — Encounter: Payer: Medicare Other | Admitting: Family Medicine

## 2015-08-16 ENCOUNTER — Other Ambulatory Visit: Payer: Self-pay | Admitting: Family Medicine

## 2015-08-18 NOTE — Telephone Encounter (Signed)
2nd request.  Patient out of medication.  [name], RN

## 2015-10-31 ENCOUNTER — Encounter: Payer: Self-pay | Admitting: Family Medicine

## 2015-10-31 ENCOUNTER — Ambulatory Visit (INDEPENDENT_AMBULATORY_CARE_PROVIDER_SITE_OTHER): Payer: Medicare Other | Admitting: Family Medicine

## 2015-10-31 VITALS — BP 114/66 | HR 71 | Temp 98.6°F | Ht 68.0 in | Wt 209.0 lb

## 2015-10-31 DIAGNOSIS — M25561 Pain in right knee: Secondary | ICD-10-CM

## 2015-10-31 DIAGNOSIS — Z3049 Encounter for surveillance of other contraceptives: Secondary | ICD-10-CM | POA: Diagnosis not present

## 2015-10-31 DIAGNOSIS — F411 Generalized anxiety disorder: Secondary | ICD-10-CM | POA: Diagnosis not present

## 2015-10-31 DIAGNOSIS — G8929 Other chronic pain: Secondary | ICD-10-CM | POA: Diagnosis not present

## 2015-10-31 DIAGNOSIS — R7303 Prediabetes: Secondary | ICD-10-CM | POA: Diagnosis not present

## 2015-10-31 DIAGNOSIS — M25562 Pain in left knee: Secondary | ICD-10-CM | POA: Diagnosis not present

## 2015-10-31 DIAGNOSIS — E669 Obesity, unspecified: Secondary | ICD-10-CM

## 2015-10-31 DIAGNOSIS — E785 Hyperlipidemia, unspecified: Secondary | ICD-10-CM

## 2015-10-31 DIAGNOSIS — M545 Low back pain, unspecified: Secondary | ICD-10-CM

## 2015-10-31 DIAGNOSIS — Z1159 Encounter for screening for other viral diseases: Secondary | ICD-10-CM

## 2015-10-31 DIAGNOSIS — I10 Essential (primary) hypertension: Secondary | ICD-10-CM

## 2015-10-31 DIAGNOSIS — G4700 Insomnia, unspecified: Secondary | ICD-10-CM | POA: Diagnosis not present

## 2015-10-31 LAB — LIPID PANEL
Cholesterol: 191 mg/dL (ref 125–200)
HDL: 51 mg/dL (ref >=46)
LDL Cholesterol: 100 mg/dL (ref <130)
Total CHOL/HDL Ratio: 3.7 Ratio (ref <=5.0)
Triglycerides: 199 mg/dL — ABNORMAL HIGH (ref <150)
VLDL: 40 mg/dL — ABNORMAL HIGH (ref <30)

## 2015-10-31 LAB — BASIC METABOLIC PANEL WITH GFR
BUN: 20 mg/dL (ref 7–25)
CO2: 28 mmol/L (ref 20–31)
Calcium: 8.8 mg/dL (ref 8.6–10.4)
Chloride: 107 mmol/L (ref 98–110)
Creat: 1.14 mg/dL — ABNORMAL HIGH (ref 0.50–1.05)
GFR, Est African American: 62 mL/min (ref >=60)
GFR, Est Non African American: 54 mL/min — ABNORMAL LOW (ref >=60)
Glucose, Bld: 106 mg/dL — ABNORMAL HIGH (ref 65–99)
Potassium: 4.4 mmol/L (ref 3.5–5.3)
Sodium: 141 mmol/L (ref 135–146)

## 2015-10-31 LAB — POCT URINE PREGNANCY: Preg Test, Ur: NEGATIVE

## 2015-10-31 LAB — POCT GLYCOSYLATED HEMOGLOBIN (HGB A1C): Hemoglobin A1C: 5.8

## 2015-10-31 MED ORDER — PROMETHAZINE HCL 25 MG RE SUPP: 25.0000 mg | Freq: Four times a day (QID) | RECTAL | 0 refills | Status: DC | PRN

## 2015-10-31 MED ORDER — CYCLOBENZAPRINE HCL 10 MG PO TABS: ORAL_TABLET | ORAL | 2 refills | Status: DC

## 2015-10-31 MED ORDER — ZOLPIDEM TARTRATE 5 MG PO TABS: 5.0000 mg | ORAL_TABLET | Freq: Every evening | ORAL | 1 refills | Status: DC | PRN

## 2015-10-31 MED ORDER — IBUPROFEN 800 MG PO TABS: 800.0000 mg | ORAL_TABLET | Freq: Three times a day (TID) | ORAL | 1 refills | Status: DC | PRN

## 2015-10-31 MED ORDER — MEDROXYPROGESTERONE ACETATE 150 MG/ML IM SUSP: 150.0000 mg | Freq: Once | INTRAMUSCULAR | Status: DC

## 2015-10-31 MED ORDER — METOPROLOL TARTRATE 25 MG PO TABS: 25.0000 mg | ORAL_TABLET | Freq: Two times a day (BID) | ORAL | 2 refills | Status: DC

## 2015-10-31 MED ORDER — LISINOPRIL-HYDROCHLOROTHIAZIDE 20-25 MG PO TABS: 1.0000 | ORAL_TABLET | Freq: Every day | ORAL | 3 refills | Status: DC

## 2015-10-31 MED ORDER — PANTOPRAZOLE SODIUM 40 MG PO TBEC: DELAYED_RELEASE_TABLET | ORAL | 3 refills | Status: DC

## 2015-10-31 MED ORDER — KETOROLAC TROMETHAMINE 30 MG/ML IJ SOLN
30.0000 mg | Freq: Once | INTRAMUSCULAR | Status: AC
  Administered 2015-10-31: 30 mg via INTRAMUSCULAR

## 2015-10-31 MED ORDER — MEDROXYPROGESTERONE ACETATE 150 MG/ML IM SUSY
150.0000 mg | PREFILLED_SYRINGE | Freq: Once | INTRAMUSCULAR | Status: AC
  Administered 2015-10-31: 150 mg via INTRAMUSCULAR

## 2015-10-31 NOTE — Assessment & Plan Note (Signed)
Not currently on medications  Lipid panel today

## 2015-10-31 NOTE — Assessment & Plan Note (Signed)
Ongoing chronic back pain Followed by orthopedics Continue Flexeril Patient unwilling to try other medications or go to pain management clinic No narcotics IM Toradol today and advised discontinuing chronic NSAIDs  Given heart risk F/u 3 months

## 2015-10-31 NOTE — Assessment & Plan Note (Signed)
As patient has already been off of benzos for about 6 months, will not resume today Again discussed SSRIs  And Cymbalta with the patient, but she is unwilling to try these Follow-up with psychiatry as she is already an established patient We will let psychiatry medically manage

## 2015-10-31 NOTE — Assessment & Plan Note (Signed)
-  Counseled on diet and exercise.

## 2015-10-31 NOTE — Assessment & Plan Note (Signed)
>>  ASSESSMENT AND PLAN FOR HYPERTENSION, BENIGN ESSENTIAL WRITTEN ON 10/31/2015 10:43 AM BY Mazie Speed, MD  Well-controlled Continue current medications BMP today Follow-up in 6 months

## 2015-10-31 NOTE — Assessment & Plan Note (Addendum)
Well-controlled Continue current medications BMP today Follow-up in 6 months

## 2015-10-31 NOTE — Progress Notes (Signed)
Pts last depo was 02/2015.  Upreg performed today with negative results, pt has not been sexually active in past 2 weeks.  Depo given, pt tolerated well.  Talea Manges, Salome Spotted, CMA
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
## 2015-10-31 NOTE — Patient Instructions (Addendum)
Nice to see you again today. Continue take your current blood pressure medications. We are getting some labs today and someone will call you or send you a letter with the results when they're available.  Do not take them present for the next 3 days after receiving Toradol shot today. In the long-term,anti-inflammatories like ibuprofen and Toradol can be bad for your heart.Try Tylenol instead.  Come back in 3 months to discuss your chronic pain again.  Take care, Dr. Jacinto Reap

## 2015-10-31 NOTE — Assessment & Plan Note (Signed)
Offered alternative medications but patient refused Continue Ambien sparingly

## 2015-10-31 NOTE — Assessment & Plan Note (Signed)
A1c today Discussed diet and exercise

## 2015-11-01 LAB — HEPATITIS C ANTIBODY: HCV Ab: NEGATIVE

## 2015-11-02 ENCOUNTER — Telehealth: Payer: Self-pay | Admitting: Family Medicine

## 2015-11-02 ENCOUNTER — Encounter: Payer: Self-pay | Admitting: Family Medicine

## 2015-11-02 NOTE — Telephone Encounter (Signed)
Attempted to call patient with lab results.  No answer on either phone number listed.  Was going to discuss lab results with patient.  Cholesterol panel is good.  A1c is better than previously, but still in pre-diabetes range.  Electrolytes normal.  HCV screen neg.  Creatinine (kidney function) is slightly elevated.  This could be related to NSAID use.  Would recommend holding all NSAIDs including ibuprofen for 1 month and following up with any provider for repeat lab work in 1 month.  Please attempt to call patient again and inform her of this.  Will also send letter.  Virginia Crews, MD, MPH PGY-3,  [facility] 11/02/2015 9:50 AM

## 2015-11-03 NOTE — Telephone Encounter (Signed)
Attempted to call pt, phone not in service. Shannon Stuart Kennon Holter, CMA

## 2015-11-16 ENCOUNTER — Other Ambulatory Visit: Payer: Self-pay | Admitting: *Deleted

## 2015-11-16 DIAGNOSIS — G8929 Other chronic pain: Secondary | ICD-10-CM

## 2015-11-16 DIAGNOSIS — I10 Essential (primary) hypertension: Secondary | ICD-10-CM

## 2015-11-16 DIAGNOSIS — M545 Low back pain, unspecified: Secondary | ICD-10-CM

## 2015-11-16 DIAGNOSIS — G4700 Insomnia, unspecified: Secondary | ICD-10-CM

## 2015-11-16 MED ORDER — ZOLPIDEM TARTRATE 5 MG PO TABS: 5.0000 mg | ORAL_TABLET | Freq: Every evening | ORAL | 1 refills | Status: DC | PRN

## 2015-11-16 MED ORDER — PANTOPRAZOLE SODIUM 40 MG PO TBEC: DELAYED_RELEASE_TABLET | ORAL | 3 refills | Status: DC

## 2015-11-16 MED ORDER — METOPROLOL TARTRATE 25 MG PO TABS: 25.0000 mg | ORAL_TABLET | Freq: Two times a day (BID) | ORAL | 2 refills | Status: DC

## 2015-11-16 MED ORDER — PROMETHAZINE HCL 25 MG RE SUPP: 25.0000 mg | Freq: Four times a day (QID) | RECTAL | 0 refills | Status: DC | PRN

## 2015-11-16 MED ORDER — CYCLOBENZAPRINE HCL 10 MG PO TABS: ORAL_TABLET | ORAL | 2 refills | Status: DC

## 2015-11-16 MED ORDER — LISINOPRIL-HYDROCHLOROTHIAZIDE 20-25 MG PO TABS: 1.0000 | ORAL_TABLET | Freq: Every day | ORAL | 3 refills | Status: DC

## 2015-11-16 NOTE — Telephone Encounter (Signed)
Patient states that she never received her scripts from 10-31-15.  She needs these called into [facility] [address] since pcp is out of office.  States that she has been contacting them since her appointment and was told it was never received.  Will forward to red team to call these scripts in for patient. Zuhayr Deeney,CMA

## 2015-12-13 DIAGNOSIS — M129 Arthropathy, unspecified: Secondary | ICD-10-CM | POA: Diagnosis not present

## 2015-12-13 DIAGNOSIS — E669 Obesity, unspecified: Secondary | ICD-10-CM | POA: Diagnosis not present

## 2015-12-13 DIAGNOSIS — I159 Secondary hypertension, unspecified: Secondary | ICD-10-CM | POA: Diagnosis not present

## 2015-12-13 DIAGNOSIS — M545 Low back pain: Secondary | ICD-10-CM | POA: Diagnosis not present

## 2016-01-12 DIAGNOSIS — M129 Arthropathy, unspecified: Secondary | ICD-10-CM | POA: Diagnosis not present

## 2016-01-12 DIAGNOSIS — E669 Obesity, unspecified: Secondary | ICD-10-CM | POA: Diagnosis not present

## 2016-01-12 DIAGNOSIS — I159 Secondary hypertension, unspecified: Secondary | ICD-10-CM | POA: Diagnosis not present

## 2016-01-12 DIAGNOSIS — M545 Low back pain: Secondary | ICD-10-CM | POA: Diagnosis not present

## 2016-01-15 ENCOUNTER — Ambulatory Visit (INDEPENDENT_AMBULATORY_CARE_PROVIDER_SITE_OTHER): Payer: Medicare Other | Admitting: Internal Medicine

## 2016-01-15 ENCOUNTER — Encounter: Payer: Self-pay | Admitting: Internal Medicine

## 2016-01-15 VITALS — BP 115/77 | HR 76 | Temp 98.4°F | Ht 68.0 in | Wt 232.6 lb

## 2016-01-15 DIAGNOSIS — G4700 Insomnia, unspecified: Secondary | ICD-10-CM | POA: Diagnosis not present

## 2016-01-15 DIAGNOSIS — G8929 Other chronic pain: Secondary | ICD-10-CM | POA: Diagnosis not present

## 2016-01-15 DIAGNOSIS — Z23 Encounter for immunization: Secondary | ICD-10-CM

## 2016-01-15 DIAGNOSIS — M5441 Lumbago with sciatica, right side: Secondary | ICD-10-CM | POA: Diagnosis present

## 2016-01-15 MED ORDER — ZOLPIDEM TARTRATE 5 MG PO TABS: 5.0000 mg | ORAL_TABLET | Freq: Every evening | ORAL | 0 refills | Status: DC | PRN

## 2016-01-15 MED ORDER — KETOROLAC TROMETHAMINE 15 MG/ML IJ SOLN
15.0000 mg | Freq: Once | INTRAMUSCULAR | Status: AC
  Administered 2016-01-15: 15 mg via INTRAMUSCULAR

## 2016-01-15 NOTE — Patient Instructions (Addendum)
Ms. Stockel,  I'm sorry you are having back pain. I hope the toradol provides some relief. Continue heating packs as able. Take tylenol as needed but avoid more NSAIDs, as your kidney function was elevated at last visit.  I have prescribed ambien to take sparingly. Please follow-up with Dr. B to discuss further and for more refills.  You are due for depo shot between October 24th and November 7th. If this is before your appointment with Dr. Jacinto Reap, please make a nursing visit.  Best, Dr. Ola Spurr

## 2016-01-15 NOTE — Progress Notes (Signed)
[facility] Progress Note  Subjective:  Shannon Stuart is a 55 y.o. female with chronic back pain who presents for SDA for toradol injection. She had lumbar back surgery x 2 in 1988 and has had pain since. She follows with Orthopedics. Keeping active by walking her chihuahua normally helps her pain, but she has been hurting too much the last 2 days to be active. Pain extends down back of right leg and will have pins and needles sensation in feet bilaterally. These are not a new symptoms. She takes flexeril TID, which helps with sensation of back muscles tightening up. She also takes a spoonful of mustard as a natural remedy. She has been avoiding regular NSAID use because her creatinine was elevated at her last appointment with her PCP. In the past, toradol shots have provided enough relief for patient to get active again. Heat also helps. This pain also affects her sleep, and she has been taking [place] about twice a week. She has been out of this prescription because she took too long to refill PCP's last prescription, written in August. Per patient, she has been told by Orthopedics that she ultimately will need surgery for her back pain but that she should wait as long as she can tolerate and try to lose weight to increase odds of good outcome.  ROS: No loss of bladder or bowel incontinence, no falls, no leg weakness   Objective: Blood pressure 115/77, pulse 76, temperature 98.4 F (36.9 C), temperature source Oral, height 5\' 8"  (1.727 m), weight 232 lb 9.6 oz (105.5 kg), SpO2 98 %. Body mass index is 35.37 kg/m. Constitutional: Well-appearing female, in NAD Pulmonary/Chest: Effort normal and breath sounds normal. No respiratory distress.  Musculoskeletal: TTP over lumbar paraspinal muscles. Midline spinal tenderness over old lumbar scar. Pain with FABER test over SI joints bilaterally.  Neurological: Peripheral sensation intact.  Skin: Skin is warm and dry. No rash noted. No  erythema.  Psychiatric: Normal mood and affect.  Vitals reviewed  Assessment/Plan: Chronic low back pain - Ordered toradol shot 15 mg at half usual dose, given recent increase in SCr. Recommended repeating BMP today, but patient prefers to have this done when she follows up with PCP next month. Recommended heat therapy and staying active as able.  - Discussed risks of continuing ambien. However, patient appears to be using appropriately prn. Provided Rx of #15 pills and explained continued use will be up to PCP. Since pain seems to be contributing to insomnia, would consider trying nightly gabapentin instead, though patient has had concerns about side effects from this medication when discussed previously.    Follow-up with PCP next mother, as scheduled.  Olene Floss, MD [place], PGY-2

## 2016-01-18 NOTE — Assessment & Plan Note (Signed)
-   Ordered toradol shot 15 mg at half usual dose, given recent increase in SCr. Recommended repeating BMP today, but patient prefers to have this done when she follows up with PCP next month. Recommended heat therapy and staying active as able.  - Discussed risks of continuing ambien. However, patient appears to be using appropriately prn. Provided Rx of #15 pills and explained continued use will be up to PCP. Since pain seems to be contributing to insomnia, would consider trying nightly gabapentin instead, though patient has had concerns about side effects from this medication when discussed previously.

## 2016-01-24 ENCOUNTER — Other Ambulatory Visit: Payer: Self-pay | Admitting: Family Medicine

## 2016-01-24 NOTE — Telephone Encounter (Signed)
Patient states that she never received her script for [place].  I informed her that per chart notes it said that this was printed but that I would call it in her for.  Patient voiced understanding. Shannon Stuart,CMA

## 2016-01-24 NOTE — Telephone Encounter (Signed)
Pt states Ambien was called in on the 23rd, but [facility] [address] [facility] not received anything. Please advise. Thanks! ep

## 2016-01-26 ENCOUNTER — Ambulatory Visit (INDEPENDENT_AMBULATORY_CARE_PROVIDER_SITE_OTHER): Payer: Medicare Other | Admitting: *Deleted

## 2016-01-26 DIAGNOSIS — Z3042 Encounter for surveillance of injectable contraceptive: Secondary | ICD-10-CM

## 2016-01-26 MED ORDER — MEDROXYPROGESTERONE ACETATE 150 MG/ML IM SUSY
150.0000 mg | PREFILLED_SYRINGE | Freq: Once | INTRAMUSCULAR | Status: AC
  Administered 2016-01-26: 150 mg via INTRAMUSCULAR

## 2016-01-26 NOTE — Progress Notes (Signed)
   Patient presents for Depo Provera injection States feeling well Last injection received 10/31/2015 Patient is within dates for injection  Depo Provera given IM RUOQ. Patient tolerated well.  Next injection due Jan 19-Apr 26, 2016 Reminder card given Velora Heckler, RN

## 2016-02-12 DIAGNOSIS — M545 Low back pain: Secondary | ICD-10-CM | POA: Diagnosis not present

## 2016-02-12 DIAGNOSIS — M129 Arthropathy, unspecified: Secondary | ICD-10-CM | POA: Diagnosis not present

## 2016-02-12 DIAGNOSIS — E669 Obesity, unspecified: Secondary | ICD-10-CM | POA: Diagnosis not present

## 2016-02-12 DIAGNOSIS — I159 Secondary hypertension, unspecified: Secondary | ICD-10-CM | POA: Diagnosis not present

## 2016-03-13 DIAGNOSIS — M545 Low back pain: Secondary | ICD-10-CM | POA: Diagnosis not present

## 2016-03-13 DIAGNOSIS — I159 Secondary hypertension, unspecified: Secondary | ICD-10-CM | POA: Diagnosis not present

## 2016-03-13 DIAGNOSIS — M129 Arthropathy, unspecified: Secondary | ICD-10-CM | POA: Diagnosis not present

## 2016-03-13 DIAGNOSIS — E669 Obesity, unspecified: Secondary | ICD-10-CM | POA: Diagnosis not present

## 2016-04-08 ENCOUNTER — Other Ambulatory Visit: Payer: Self-pay | Admitting: *Deleted

## 2016-04-09 MED ORDER — IBUPROFEN 800 MG PO TABS: 800.0000 mg | ORAL_TABLET | Freq: Three times a day (TID) | ORAL | 1 refills | Status: DC | PRN

## 2016-04-10 ENCOUNTER — Encounter: Payer: Self-pay | Admitting: *Deleted

## 2016-04-10 NOTE — Progress Notes (Signed)
Patient has an appointment in nurse clinic on 04/12/2016 for Depo Provera and pain injection.  Patient does not have an order for pain injection. Please advise.  [name], RN

## 2016-04-12 ENCOUNTER — Ambulatory Visit (INDEPENDENT_AMBULATORY_CARE_PROVIDER_SITE_OTHER): Payer: Medicare Other | Admitting: *Deleted

## 2016-04-12 DIAGNOSIS — N938 Other specified abnormal uterine and vaginal bleeding: Secondary | ICD-10-CM | POA: Diagnosis present

## 2016-04-12 MED ORDER — MEDROXYPROGESTERONE ACETATE 150 MG/ML IM SUSP
150.0000 mg | Freq: Once | INTRAMUSCULAR | Status: AC
  Administered 2016-04-12: 150 mg via INTRAMUSCULAR

## 2016-04-12 NOTE — Progress Notes (Signed)
   Pt in for Depo Provera injection.  Pt tolerated Depo injection. Depo given left upper outer quadrant.  Next injection due April 6-20, 2018.  Reminder card given.  Patient also requested a Toradol injection.  However, patient did not have an order.  Patient stated she has an appointment at the end of the month, which she will wait until then.     [name], RN

## 2016-04-15 ENCOUNTER — Ambulatory Visit (INDEPENDENT_AMBULATORY_CARE_PROVIDER_SITE_OTHER): Payer: Medicare Other | Admitting: *Deleted

## 2016-04-15 DIAGNOSIS — M5440 Lumbago with sciatica, unspecified side: Secondary | ICD-10-CM

## 2016-04-15 DIAGNOSIS — G8929 Other chronic pain: Secondary | ICD-10-CM

## 2016-04-15 MED ORDER — KETOROLAC TROMETHAMINE 30 MG/ML IJ SOLN
30.0000 mg | Freq: Once | INTRAMUSCULAR | Status: AC
  Administered 2016-04-15: 30 mg via INTRAMUSCULAR

## 2016-04-15 NOTE — Progress Notes (Signed)
   Patient in nurse clinic for toradol injection.  Patient is has a history of chronic low back pain.  Toradol 30 mg/ml IM x 1 given per Dr. Brita Romp order.  Injection given RUOQ.  Patient has an appointment 04/22/2016.  [name], RN

## 2016-04-22 ENCOUNTER — Encounter: Payer: Medicare Other | Admitting: Family Medicine

## 2016-04-26 ENCOUNTER — Other Ambulatory Visit: Payer: Self-pay | Admitting: Family Medicine

## 2016-04-26 DIAGNOSIS — M545 Low back pain, unspecified: Secondary | ICD-10-CM

## 2016-04-26 DIAGNOSIS — G8929 Other chronic pain: Secondary | ICD-10-CM

## 2016-05-20 ENCOUNTER — Encounter: Payer: Self-pay | Admitting: Family Medicine

## 2016-05-20 ENCOUNTER — Ambulatory Visit (INDEPENDENT_AMBULATORY_CARE_PROVIDER_SITE_OTHER): Payer: Medicare Other | Admitting: Family Medicine

## 2016-05-20 VITALS — BP 112/86 | HR 77 | Temp 99.0°F | Ht 68.0 in | Wt 236.0 lb

## 2016-05-20 DIAGNOSIS — E669 Obesity, unspecified: Secondary | ICD-10-CM | POA: Diagnosis not present

## 2016-05-20 DIAGNOSIS — N938 Other specified abnormal uterine and vaginal bleeding: Secondary | ICD-10-CM

## 2016-05-20 DIAGNOSIS — M170 Bilateral primary osteoarthritis of knee: Secondary | ICD-10-CM | POA: Diagnosis not present

## 2016-05-20 DIAGNOSIS — D259 Leiomyoma of uterus, unspecified: Secondary | ICD-10-CM | POA: Diagnosis not present

## 2016-05-20 DIAGNOSIS — Z01419 Encounter for gynecological examination (general) (routine) without abnormal findings: Secondary | ICD-10-CM | POA: Diagnosis not present

## 2016-05-20 MED ORDER — TETANUS-DIPHTH-ACELL PERTUSSIS 5-2.5-18.5 LF-MCG/0.5 IM SUSP: 0.5000 mL | Freq: Once | INTRAMUSCULAR | 0 refills | Status: AC

## 2016-05-20 MED ORDER — TETANUS-DIPHTH-ACELL PERTUSSIS 5-2.5-18.5 LF-MCG/0.5 IM SUSP: 0.5000 mL | Freq: Once | INTRAMUSCULAR | 0 refills | Status: DC

## 2016-05-20 NOTE — Assessment & Plan Note (Signed)
Referral to gynecology to discuss surgical options for fibroid uterus Seems patient already had workup for possible endometrial hyperplasia in 2013 I don't think that depo shots are a good long-term solution given risk for osteoporosis

## 2016-05-20 NOTE — Assessment & Plan Note (Signed)
Pap smear due in 08/2016 Due for mammogram - patient to make appointment Next colonoscopy in 2020 Counseled on diet and exercise Prescription for tetanus vaccine given to patient to take to pharmacy

## 2016-05-20 NOTE — Assessment & Plan Note (Signed)
Referral back to sports medicine Offered knee injections, the patient would like to see sports medicine Advised patient about the risks of long-term NSAID use, but she insists on continuing ibuprofen and Toradol

## 2016-05-20 NOTE — Patient Instructions (Signed)
Things to do to keep yourself healthy  - Exercise at least 30-45 minutes a day, 3-4 days a week.  - Eat a low-fat diet with lots of fruits and vegetables, up to 7-9 servings per day.  - Seatbelts can save your life. Wear them always.  - Smoke detectors on every level of your home, check batteries every year.  - Eye Doctor - have an eye exam every 1-2 years  - Safe sex - if you may be exposed to STDs, use a condom.  - Alcohol -  If you drink, do it moderately, less than 2 drinks per day.  - [place]. Choose someone to speak for you if you are not able.  - Depression is common in our stressful world.If you're feeling down or losing interest in things you normally enjoy, please come in for a visit.  - Violence - If anyone is threatening or hurting you, please call immediately.   I'll see you back in 4-6 weeks to follow-up on blood pressure and other chronic problems. Please go get a mammogram at the breast center.  I have put in referrals for OB/GYN and sports medicine as we discussed. Please go to the pharmacy and get a tetanus shot with the prescription I have printed for you.  Take care, Dr. Jacinto Reap

## 2016-05-20 NOTE — Assessment & Plan Note (Signed)
See plan above.

## 2016-05-20 NOTE — Assessment & Plan Note (Signed)
-  Counseled on diet and exercise.

## 2016-05-20 NOTE — Progress Notes (Signed)
Subjective:   Shannon Stuart is a 56 y.o. female with a history of HTN, CAD, osteoarthritis, HLD, obesity, depression, insomnia here for well woman/preventative visit  Acute Concerns: Continues to have chronic back and b/l knee pain - getting harder to stand for a long time and stand from sitting - would like to see [facility] again - willing to try bracing and corticosteroid injections - not interested in surgical options   Diet: cutting back on starch and red meat  Exercise: walking daily  Sexual/Birth History: G1P1001, not currently sexually active  Birth Control: thinks she is postmenopausal, on depo for DUB - previously found to have normal endometrial stripe and fibroids by OBGYN  POA/Living Will: no   Review of Systems: Per HPI.    PMH, PSH, Medications, Allergies, and FmHx reviewed and updated in EMR.  Social History: former smoker  Immunization:  Tdap/TD: last one in the 90s  Influenza: Given in 12/2015  Pneumococcal: N/A  Herpes Zoster: N/A  Cancer Screening:  Pap Smear: Normal in 08/2013  Mammogram: Last in 01/2014  Colonoscopy: Completed in 07/2013 and recommended 5 year recall  Dexa: N/a   Objective:  BP 112/86   Pulse 77   Temp 99 F (37.2 C) (Oral)   Ht 5\' 8"  (1.727 m)   Wt 236 lb (107 kg)   BMI 35.88 kg/m   Gen:  56 y.o. female in NAD HEENT: NCAT, MMM, EOMI, PERRL, anicteric sclerae CV: RRR, no MRG Resp: Non-labored, CTAB, no wheezes noted Abd: Soft, NTND, BS present, no guarding or organomegaly Ext: WWP, no edema MSK: b/l Knee: R knee with small effusion, no erythema or effusion or obvious bony abnormalities.  Palpation normal with no warmth. R knee with joint line tenderness. No patellar tenderness or condyle tenderness. ROM normal in flexion and extension and lower leg rotation. Ligaments with solid consistent endpoints including ACL, PCL, LCL, MCL. Hamstring and quadriceps strength is normal. Neuro: Alert and oriented, speech  normal        Chemistry      Component Value Date/Time   NA 141 10/31/2015 1045   K 4.4 10/31/2015 1045   CL 107 10/31/2015 1045   CO2 28 10/31/2015 1045   BUN 20 10/31/2015 1045   CREATININE 1.14 (H) 10/31/2015 1045      Component Value Date/Time   CALCIUM 8.8 10/31/2015 1045   ALKPHOS 72 08/11/2014 1813   AST 27 08/11/2014 1813   ALT 25 08/11/2014 1813   BILITOT 0.7 08/11/2014 1813      Lab Results  Component Value Date   WBC 17.4 (H) 08/11/2014   HGB 14.1 08/11/2014   HCT 43.2 08/11/2014   MCV 88.2 08/11/2014   PLT 231 08/11/2014   Lab Results  Component Value Date   TSH 1.01 11/05/2011   Lab Results  Component Value Date   HGBA1C 5.8 10/31/2015    Assessment & Plan:     Shannon Stuart is a 56 y.o. female here for annual well woman/preventative exam  Dysfunctional uterine bleeding Referral to gynecology to discuss surgical options for fibroid uterus Seems patient already had workup for possible endometrial hyperplasia in 2013 I don't think that depo shots are a good long-term solution given risk for osteoporosis  Leiomyoma of uterus See plan above  Arthritis of both knees Referral back to sports medicine Offered knee injections, the patient would like to see sports medicine Advised patient about the risks of long-term NSAID use, but she insists on  continuing ibuprofen and Toradol  Obesity Counseled on diet and exercise  Well woman exam Pap smear due in 08/2016 Due for mammogram - patient to make appointment Next colonoscopy in 2020 Counseled on diet and exercise Prescription for tetanus vaccine given to patient to take to pharmacy   Virginia Crews, MD MPH PGY-3,  [facility] 05/20/2016  4:42 PM

## 2016-05-22 ENCOUNTER — Other Ambulatory Visit: Payer: Self-pay | Admitting: Family Medicine

## 2016-05-22 DIAGNOSIS — M545 Low back pain, unspecified: Secondary | ICD-10-CM

## 2016-05-22 DIAGNOSIS — G8929 Other chronic pain: Secondary | ICD-10-CM

## 2016-05-28 ENCOUNTER — Encounter: Payer: Self-pay | Admitting: Sports Medicine

## 2016-05-28 ENCOUNTER — Ambulatory Visit (INDEPENDENT_AMBULATORY_CARE_PROVIDER_SITE_OTHER): Payer: Medicare Other | Admitting: Sports Medicine

## 2016-05-28 VITALS — BP 116/80 | Ht 68.0 in | Wt 220.0 lb

## 2016-05-28 DIAGNOSIS — M170 Bilateral primary osteoarthritis of knee: Secondary | ICD-10-CM

## 2016-05-28 MED ORDER — METHYLPREDNISOLONE ACETATE 40 MG/ML IJ SUSP
40.0000 mg | Freq: Once | INTRAMUSCULAR | Status: AC
Start: 2016-05-28 — End: 2016-05-28
  Administered 2016-05-28: 40 mg via INTRA_ARTICULAR

## 2016-05-28 MED ORDER — METHYLPREDNISOLONE ACETATE 40 MG/ML IJ SUSP
40.0000 mg | Freq: Once | INTRAMUSCULAR | Status: AC
  Administered 2016-05-28: 40 mg via INTRA_ARTICULAR

## 2016-05-28 NOTE — Assessment & Plan Note (Addendum)
Pain likely due to osteoarthritis. Patient with history of severe osteoarthritis. She was recommended knee replacement surgery by orthopedics in the past that she declined. Exam with tenderness to palpation around her patella, more over medial aspect in both knees. Notable effusion in the right. No overlying skin erythema or increased warmth to touch to suspect septic joint. Doubt soft tissue injury without joint laxity.  Bilateral steroid injection, lateral approach used. See procedure note below  Knee brace applied bilaterally  Patient to call and notify us if increased swelling in the right or new swelling in the left. Will consider imaging.   Bilateral Knee Injection: lateral approach Consent obtained and verified.  Noted no overlying erythema, induration, or other signs of local infection.  Skin prepped in a sterile fashion.  Topical analgesic spray: Ethyl chloride.  Joint: Right and left knee Needle: 25-gauge 1-1/2 inch Completed without difficulty.  Meds: 3 cc 1% lidocaine, 1 cc of 40 mg depomedrol for each knee Advised to call if fevers/chills, erythema, induration, drainage, or persistent bleeding.

## 2016-05-28 NOTE — Progress Notes (Signed)
Subjective:    Shannon Stuart is a 56 y.o. old female here for bilateral knee pain  HPI Patient reports having bilateral knee pain. This is a chronic issue. Pain worse in the right than left. Denies radiation of the pain. She also reports some swelling in the right knee. She denies skin redness, fever or chills. She walks about 1-1.5 miles a day. Pain usually with walking. No notable alleviating factor. Denies nocturnal pain. She denies history of trauma or fall. Patient was seen about 2 years ago and she was given a steroid injection and knee braces that helped with the pain. She is interested in having another steroid injection.  She denies fever, chills, bowel or bladder issues, nocturnal diaphoresis or fatigue. No history of trauma.  Off note, she was noted to have bad osteoarthritis of both knees and she was recommended knee replacement by orthopedic surgeon in the past but she declined.  PMH/Problem List: has Leiomyoma of uterus; HLD (hyperlipidemia); Obesity; Anxiety state; HYPERTENSION, BENIGN ESSENTIAL; RHINITIS, ALLERGIC NOS; GERD; Dysfunctional uterine bleeding; Cough; Depression; Arthritis of both knees; Dermoid cyst; Insomnia; Abdominal pain, epigastric; Diarrhea; Prediabetes; Well woman exam; Chronic low back pain; Non-obstructive CAD; and Obesity, Class II, BMI 35-39.9 on her problem list.   has a past medical history of Allergy; Anxiety; Biliary dyskinesia; Chest pain at rest; Chronic low back pain; Depression; Endometriosis; GERD (gastroesophageal reflux disease); Headache(784.0); History of pneumonia; Hyperlipidemia; Hypertension; Non-obstructive CAD; Obesity, Class II, BMI 35-39.9; and Osteoarthritis.  FH:  Family History  Problem Relation Age of Onset  . Diabetes Maternal Grandmother   . Heart failure Maternal Grandmother     CHF  . Diabetes Sister   . Diabetes Sister     gestational  . Heart disease Mother 68    MI  . Hypertension Other     entire family  . Colon cancer Neg  Hx   . Esophageal cancer Neg Hx   . Stomach cancer Neg Hx     SH Social History  Substance Use Topics  . Smoking status: Former Smoker    Types: Cigarettes    Quit date: 03/26/1991  . Smokeless tobacco: Never Used  . Alcohol use Yes     Comment: 'SOCIALLY"    Review of Systems Review of systems negative except for pertinent positives and negatives in history of present illness above.     Objective:     Vitals:   05/28/16 1123  BP: 116/80  Weight: 220 lb (99.8 kg)  Height: 5\' 8"  (1.727 m)  Body mass index is 33.45 kg/m.  Physical Exam GEN: appears well, no apparent distress. RESP: speaks in full sentence, no IWOB, good air movement bilaterally, CTAB MSK: Right knee appears slightly bigger than left. Notable effusion in right knee. No skin erythema. No increased warmth to touch. Tender to palpation anteriorly around her kneecap, over joint lines and medially. More tenderness over the medial aspect bilaterally. Mild crepitus bilaterally. Full flexion and extension but with significant pain in right knee. Pain with valgus > varus stress. No signs of joint laxity.  No calf swelling, tenderness, palpable cord or erthema. Negative Homan's sign.   Neuro: motor 5/5 with hip and knee flexion and extension in left, 4/5 with knee extension partly due to pain, light sensation intact, patellar reflex 1+ bilaterally    Assessment and Plan:  Arthritis of both knees Pain likely due to osteoarthritis. Patient with history of severe osteoarthritis. She was recommended knee replacement surgery by orthopedics in the past that  she declined. Exam with tenderness to palpation around her patella, more over medial aspect in both knees. Notable effusion in the right. No overlying skin erythema or increased warmth to touch to suspect septic joint. Doubt soft tissue injury without joint laxity.  Bilateral steroid injection, lateral approach used. See procedure note below  Knee brace applied  bilaterally  Patient to call and notify us if increased swelling in the right or new swelling in the left. Will consider imaging.   Bilateral Knee Injection: lateral approach Consent obtained and verified.  Noted no overlying erythema, induration, or other signs of local infection.  Skin prepped in a sterile fashion.  Topical analgesic spray: Ethyl chloride.  Joint: Right and left knee Needle: 25-gauge 1-1/2 inch Completed without difficulty.  Meds: 3 cc 1% lidocaine, 1 cc of 40 mg depomedrol for each knee Advised to call if fevers/chills, erythema, induration, drainage, or persistent bleeding.   Mercy Riding, MD 05/28/16 Pager: [phone]   Patient seen and evaluated with the resident. I agree with the above plan of care. Patient has done well with cortisone injections in the past. We reinjected her knees today with cortisone today. An anterior lateral approach utilized. If she does not notice significant benefit from today's injections then she will notify me and I will start with getting updated x-rays of her knees. We will also provide her with bilateral double upright braces to wear when active.

## 2016-07-01 ENCOUNTER — Encounter: Payer: Self-pay | Admitting: Family Medicine

## 2016-07-01 ENCOUNTER — Ambulatory Visit (INDEPENDENT_AMBULATORY_CARE_PROVIDER_SITE_OTHER): Payer: Medicare Other | Admitting: Family Medicine

## 2016-07-01 VITALS — BP 112/82 | HR 83 | Temp 98.6°F | Ht 68.0 in | Wt 236.4 lb

## 2016-07-01 DIAGNOSIS — E669 Obesity, unspecified: Secondary | ICD-10-CM | POA: Diagnosis not present

## 2016-07-01 DIAGNOSIS — G4700 Insomnia, unspecified: Secondary | ICD-10-CM | POA: Diagnosis not present

## 2016-07-01 DIAGNOSIS — I10 Essential (primary) hypertension: Secondary | ICD-10-CM | POA: Diagnosis present

## 2016-07-01 DIAGNOSIS — R7303 Prediabetes: Secondary | ICD-10-CM | POA: Diagnosis not present

## 2016-07-01 DIAGNOSIS — I25119 Atherosclerotic heart disease of native coronary artery with unspecified angina pectoris: Secondary | ICD-10-CM

## 2016-07-01 LAB — POCT GLYCOSYLATED HEMOGLOBIN (HGB A1C): Hemoglobin A1C: 6.3

## 2016-07-01 MED ORDER — ZOLPIDEM TARTRATE 5 MG PO TABS: 5.0000 mg | ORAL_TABLET | Freq: Every evening | ORAL | 1 refills | Status: DC | PRN

## 2016-07-01 NOTE — Progress Notes (Signed)
Subjective:   Shannon Stuart is a 56 y.o. female with a history of , CAD, HLD, depression, prediabetes, chronic low back pain, obesity here for hypertension and prediabetes follow-up  HTN: - Medications: Lisinopril HCTZ 20-25mg  daily, metoprolol 25 mg twice a day - Compliance: good - Checking BP at home: no - Denies any SOB, CP, vision changes, LE edema, medication SEs, or symptoms of hypotension - Diet: cut out red meat and pork, decreased carb intake - current girlfriend is helping her be more health conscious  - Exercise: walking, swimming, water aerobics 2-3 hours 3 times weekly - weight has slowly increased from 205lb in 2016 to 236  Prediabetes - Last A1c 5.8 in 10/2015 - Medications: none - Diet/Exercise: as above - denies symptoms of hypoglycemia, polyuria, polydipsia, change in numbness extremities (numbness in L foot 2/2 back injury), foot ulcers/trauma  Insomnia - trouble felling asleep and staying asleep intermittently due to back/hip/knee pain - taking ambien 2-3 times weekly (last Rx 12/2015 #15) - previously took tylenol PM but will not take again as 1 pill stuck in throat - tried amitriptyline but caused anger outbursts - will not take gabapentin due to something she saw on the news   Review of Systems:  Per HPI.   Social History: former smoker  Objective:  BP 112/82   Pulse 83   Temp 98.6 F (37 C) (Oral)   Ht 5\' 8"  (1.727 m)   Wt 236 lb 6.4 oz (107.2 kg)   SpO2 97%   BMI 35.94 kg/m    Filed Weights   07/01/16 1346  Weight: 236 lb 6.4 oz (107.2 kg)   Gen:  56 y.o. female in NAD HEENT: NCAT, MMM, anicteric sclerae CV: RRR, no MRG Resp: Non-labored, CTAB, no wheezes noted Ext: WWP, no edema MSK: No obvious deformities, gait intact Neuro: Alert and oriented, speech normal Psych: Appropriate grooming and dress, appropriate mood and affect, no evidence of AVH       Chemistry      Component Value Date/Time   NA 141 10/31/2015 1045   K 4.4  10/31/2015 1045   CL 107 10/31/2015 1045   CO2 28 10/31/2015 1045   BUN 20 10/31/2015 1045   CREATININE 1.14 (H) 10/31/2015 1045      Component Value Date/Time   CALCIUM 8.8 10/31/2015 1045   ALKPHOS 72 08/11/2014 1813   AST 27 08/11/2014 1813   ALT 25 08/11/2014 1813   BILITOT 0.7 08/11/2014 1813      Lab Results  Component Value Date   WBC 17.4 (H) 08/11/2014   HGB 14.1 08/11/2014   HCT 43.2 08/11/2014   MCV 88.2 08/11/2014   PLT 231 08/11/2014   Lab Results  Component Value Date   TSH 1.01 11/05/2011   Lab Results  Component Value Date   HGBA1C 6.3 07/01/2016   Assessment & Plan:     Shannon Stuart is a 56 y.o. female here for   Non-obstructive CAD No chest pain or shortness of breath Start daily aspirin 81 mg  HYPERTENSION, BENIGN ESSENTIAL Well-controlled Continue current medications BMP today Follow-up in 3-6 months  Prediabetes A1c increased from 5.8-6.3 Long discussion about healthy diet and exercise Patient is working on decreasing carbohydrate intake and exercising more frequently She is very motivated to not get diabetes Follow-up on diet and exercise in 2 months  Obesity, Class II, BMI 35-39.9 Discussed diet and exercise Follow-up in 2 months  Insomnia Offered alternative medications the patient refused  Discussed addictive nature of Ambien We will continue Ambien sparingly at this time Would consider continue taper in the future   Virginia Crews, MD MPH PGY-3,  [facility] 07/01/2016  2:17 PM

## 2016-07-01 NOTE — Assessment & Plan Note (Signed)
No chest pain or shortness of breath Start daily aspirin 81 mg

## 2016-07-01 NOTE — Patient Instructions (Signed)
Nice to see you again today. Continue current blood pressure medicines. Continue to work on diet and exercise for the prediabetes. Start taking a baby aspirin 81 mg daily. We'll follow-up in 2 months and see how you're doing at that time.  Take care, Dr. B  Things to do to keep yourself healthy  - Exercise at least 30-45 minutes a day, 3-4 days a week.  - Eat a low-fat diet with lots of fruits and vegetables, up to 7-9 servings per day.  - Seatbelts can save your life. Wear them always.  - Smoke detectors on every level of your home, check batteries every year.  - Eye Doctor - have an eye exam every 1-2 years  - Safe sex - if you may be exposed to STDs, use a condom.  - Alcohol -  If you drink, do it moderately, less than 2 drinks per day.  - [place]. Choose someone to speak for you if you are not able.  - Depression is common in our stressful world.If you're feeling down or losing interest in things you normally enjoy, please come in for a visit.  - Violence - If anyone is threatening or hurting you, please call immediately.

## 2016-07-01 NOTE — Assessment & Plan Note (Signed)
Well-controlled Continue current medications BMP today Follow-up in 3-6 months

## 2016-07-01 NOTE — Assessment & Plan Note (Signed)
Discussed diet and exercise.  Follow-up in 2 months. 

## 2016-07-01 NOTE — Assessment & Plan Note (Signed)
Offered alternative medications the patient refused Discussed addictive nature of Ambien We will continue Ambien sparingly at this time Would consider continue taper in the future

## 2016-07-01 NOTE — Assessment & Plan Note (Signed)
>>  ASSESSMENT AND PLAN FOR HYPERTENSION, BENIGN ESSENTIAL WRITTEN ON 07/01/2016  2:16 PM BY Mazie [facility], MD  Well-controlled Continue current medications BMP today Follow-up in 3-6 months

## 2016-07-01 NOTE — Assessment & Plan Note (Signed)
A1c increased from 5.8-6.3 Long discussion about healthy diet and exercise Patient is working on decreasing carbohydrate intake and exercising more frequently She is very motivated to not get diabetes Follow-up on diet and exercise in 2 months

## 2016-07-02 ENCOUNTER — Encounter: Payer: Self-pay | Admitting: Family Medicine

## 2016-07-02 LAB — BASIC METABOLIC PANEL
BUN/Creatinine Ratio: 14 (ref 9–23)
BUN: 16 mg/dL (ref 6–24)
CO2: 27 mmol/L (ref 18–29)
Calcium: 8.4 mg/dL — ABNORMAL LOW (ref 8.7–10.2)
Chloride: 103 mmol/L (ref 96–106)
Creatinine, Ser: 1.13 mg/dL — ABNORMAL HIGH (ref 0.57–1.00)
GFR calc Af Amer: 62 mL/min/{1.73_m2} (ref >59)
GFR calc non Af Amer: 54 mL/min/{1.73_m2} — ABNORMAL LOW (ref >59)
Glucose: 125 mg/dL — ABNORMAL HIGH (ref 65–99)
Potassium: 3.5 mmol/L (ref 3.5–5.2)
Sodium: 144 mmol/L (ref 134–144)

## 2016-07-20 ENCOUNTER — Emergency Department (HOSPITAL_COMMUNITY)
Admission: EM | Admit: 2016-07-20 | Discharge: 2016-07-20 | Disposition: A | Discharge status: Home / Self Care | Payer: Medicare Other | Attending: Emergency Medicine | Admitting: Emergency Medicine

## 2016-07-20 ENCOUNTER — Encounter (HOSPITAL_COMMUNITY): Payer: Self-pay | Admitting: *Deleted

## 2016-07-20 DIAGNOSIS — Z87891 Personal history of nicotine dependence: Secondary | ICD-10-CM | POA: Insufficient documentation

## 2016-07-20 DIAGNOSIS — I10 Essential (primary) hypertension: Secondary | ICD-10-CM | POA: Diagnosis not present

## 2016-07-20 DIAGNOSIS — R112 Nausea with vomiting, unspecified: Secondary | ICD-10-CM | POA: Insufficient documentation

## 2016-07-20 DIAGNOSIS — R109 Unspecified abdominal pain: Secondary | ICD-10-CM | POA: Diagnosis present

## 2016-07-20 DIAGNOSIS — R1013 Epigastric pain: Secondary | ICD-10-CM | POA: Insufficient documentation

## 2016-07-20 DIAGNOSIS — Z79899 Other long term (current) drug therapy: Secondary | ICD-10-CM | POA: Insufficient documentation

## 2016-07-20 DIAGNOSIS — E876 Hypokalemia: Secondary | ICD-10-CM | POA: Diagnosis not present

## 2016-07-20 LAB — CBC
HCT: 43.5 % (ref 36.0–46.0)
Hemoglobin: 14.8 g/dL (ref 12.0–15.0)
MCH: 30 pg (ref 26.0–34.0)
MCHC: 34 g/dL (ref 30.0–36.0)
MCV: 88.2 fL (ref 78.0–100.0)
Platelets: 243 10*3/uL (ref 150–400)
RBC: 4.93 MIL/uL (ref 3.87–5.11)
RDW: 15.3 % (ref 11.5–15.5)
WBC: 12.3 10*3/uL — ABNORMAL HIGH (ref 4.0–10.5)

## 2016-07-20 LAB — URINALYSIS, ROUTINE W REFLEX MICROSCOPIC
Bilirubin Urine: NEGATIVE
Glucose, UA: 50 mg/dL — AB
Ketones, ur: 5 mg/dL — AB
Leukocytes, UA: NEGATIVE
Nitrite: NEGATIVE
Protein, ur: 100 mg/dL — AB
Specific Gravity, Urine: 1.016 (ref 1.005–1.030)
pH: 6 (ref 5.0–8.0)

## 2016-07-20 LAB — COMPREHENSIVE METABOLIC PANEL
ALT: 28 U/L (ref 14–54)
AST: 35 U/L (ref 15–41)
Albumin: 4 g/dL (ref 3.5–5.0)
Alkaline Phosphatase: 53 U/L (ref 38–126)
Anion gap: 15 (ref 5–15)
BUN: 11 mg/dL (ref 6–20)
CO2: 23 mmol/L (ref 22–32)
Calcium: 8.9 mg/dL (ref 8.9–10.3)
Chloride: 97 mmol/L — ABNORMAL LOW (ref 101–111)
Creatinine, Ser: 1.13 mg/dL — ABNORMAL HIGH (ref 0.44–1.00)
GFR calc Af Amer: >60 mL/min (ref >60)
GFR calc non Af Amer: 53 mL/min — ABNORMAL LOW (ref >60)
Glucose, Bld: 158 mg/dL — ABNORMAL HIGH (ref 65–99)
Potassium: 2.8 mmol/L — ABNORMAL LOW (ref 3.5–5.1)
Sodium: 135 mmol/L (ref 135–145)
Total Bilirubin: 1.1 mg/dL (ref 0.3–1.2)
Total Protein: 6.3 g/dL — ABNORMAL LOW (ref 6.5–8.1)

## 2016-07-20 LAB — LIPASE, BLOOD: Lipase: 27 U/L (ref 11–51)

## 2016-07-20 MED ORDER — PROMETHAZINE HCL 25 MG RE SUPP: 25.0000 mg | Freq: Four times a day (QID) | RECTAL | 0 refills | Status: DC | PRN

## 2016-07-20 MED ORDER — PROMETHAZINE HCL 25 MG/ML IJ SOLN
12.5000 mg | Freq: Once | INTRAMUSCULAR | Status: AC
  Administered 2016-07-20: 12.5 mg via INTRAVENOUS
  Filled 2016-07-20: qty 1

## 2016-07-20 MED ORDER — HYDROMORPHONE HCL 1 MG/ML IJ SOLN
0.5000 mg | Freq: Once | INTRAMUSCULAR | Status: AC
  Administered 2016-07-20: 0.5 mg via INTRAVENOUS
  Filled 2016-07-20: qty 1

## 2016-07-20 MED ORDER — HYDROCODONE-ACETAMINOPHEN 5-325 MG PO TABS: 1.0000 | ORAL_TABLET | Freq: Four times a day (QID) | ORAL | 0 refills | Status: DC | PRN

## 2016-07-20 MED ORDER — SODIUM CHLORIDE 0.9 % IV BOLUS (SEPSIS)
1000.0000 mL | Freq: Once | INTRAVENOUS | Status: AC
  Administered 2016-07-20: 1000 mL via INTRAVENOUS

## 2016-07-20 MED ORDER — SODIUM CHLORIDE 0.9 % IV SOLN
30.0000 meq | Freq: Once | INTRAVENOUS | Status: AC
  Administered 2016-07-20: 30 meq via INTRAVENOUS
  Filled 2016-07-20: qty 15

## 2016-07-20 NOTE — ED Triage Notes (Signed)
The pt is c/o abd cramping  With nausea and vomitiong since this am  No diarrhea  She is allergic to zofran

## 2016-07-20 NOTE — ED Provider Notes (Signed)
[facility] DEPT Provider Note   CSN: [id number] Arrival date & time: 07/20/16  0434     History   Chief Complaint Chief Complaint  Patient presents with  . Abdominal Pain    HPI Shannon Stuart is a 56 y.o. female who presents with abdominal pain, N/V. PMH significant for recurrent N/V, HTN, obesity. Past surgical hx significant for cholecystectomy. She states that she started not feeling well 2 days ago. Yesterday she had abdominal pain and multiple episodes of yellow-green vomiting. The pain is in the center of her abdomen although she states her whole abdomen is "sore" which she attributes to frequent vomiting. She denies fever, chills, chest pain, SOB, diarrhea, or dysuria. She had some frequency yesterday despite decreased PO intake. She has had a colonoscopy and endoscopy in 2015 which were overall unremarkable. She had a CT of her abdomen in 2016 after her gallbladder was out which was also unremarkable. She has not seen GI (Dr. Ardis Hughs) since before her gallbladder was out.  HPI  Past Medical History:  Diagnosis Date  . Allergy   . Anxiety    a. takes mostly daily klonopin  . Biliary dyskinesia    a. 09/2013 s/p Lap Chole ([place]).  . Chest pain at rest   . Chronic low back pain   . Depression   . Endometriosis   . GERD (gastroesophageal reflux disease)   . Headache(784.0)   . History of pneumonia   . Hyperlipidemia    a. 07/2013 LDL 126 - not on statin.  Marland Kitchen Hypertension   . Non-obstructive CAD    a. Cath 2009 - minimal CAD;  b. 10/2011 Lexiscan Myoview: No ischemia or infarct.  . Obesity, Class II, BMI 35-39.9   . Osteoarthritis    a. bilateral knees.    Patient Active Problem List   Diagnosis Date Noted  . Non-obstructive CAD   . Obesity, Class II, BMI 35-39.9   . Chronic low back pain 10/12/2013  . Well woman exam 09/02/2013  . Prediabetes 06/02/2013  . Insomnia 08/18/2012  . Arthritis of both knees 04/11/2011  . Depression 08/10/2010  . HLD  (hyperlipidemia) 08/04/2009  . Leiomyoma of uterus 04/29/2008  . Dysfunctional uterine bleeding 04/26/2008  . Anxiety state 09/15/2006  . RHINITIS, ALLERGIC NOS 09/08/2006  . GERD 09/08/2006  . HYPERTENSION, BENIGN ESSENTIAL 08/04/2006    Past Surgical History:  Procedure Laterality Date  . BRAIN TUMOR EXCISION    . CHOLECYSTECTOMY N/A 10/21/2013   Procedure: LAPAROSCOPIC CHOLECYSTECTOMY WITH INTRAOPERATIVE CHOLANGIOGRAM;  Surgeon: Imogene Burn. Georgette Dover, MD;  Location: [facility];  Service: General;  Laterality: N/A;  . LUMBAR [place] SURGERY  04/1986, 12/1986   x 2  . TISSUE GRAFT    . TONSILLECTOMY      OB History    Gravida Para Term Preterm AB Living   1 1 1     1    SAB TAB Ectopic Multiple Live Births                   Home Medications    Prior to Admission medications   Medication Sig Start Date End Date Taking? Authorizing Provider  Chromium-Cinnamon (CINNAMON PLUS CHROMIUM) 100-500 MCG-MG CAPS Take by mouth every morning.    Historical Provider, MD  cyclobenzaprine (FLEXERIL) 10 MG tablet TAKE 1 TABLET (10 MG TOTAL) BY MOUTH 3 (THREE) TIMES DAILY AS NEEDED FOR MUSCLE SPASMS. 11/16/15   Katheren Shams, DO  Ginkgo Biloba 200 MG CAPS Take by mouth every  morning.    Historical Provider, MD  ibuprofen (ADVIL,MOTRIN) 800 MG tablet Take 1 tablet (800 mg total) by mouth every 8 (eight) hours as needed for moderate pain. 04/09/16   Virginia Crews, MD  ibuprofen (ADVIL,MOTRIN) 800 MG tablet TAKE 1 TABLET (800 MG TOTAL) BY MOUTH EVERY 8 (EIGHT) HOURS AS NEEDED FOR MODERATE PAIN. 04/26/16   Virginia Crews, MD  lisinopril-hydrochlorothiazide (PRINZIDE,ZESTORETIC) 20-25 MG tablet Take 1 tablet by mouth daily. 11/16/15 02/15/17  Katheren Shams, DO  medroxyPROGESTERone (DEPO-PROVERA) 150 MG/ML injection Inject 1 mL (150 mg total) into the muscle every 3 (three) months. 03/04/12   Ivy Chireno, DO  metoprolol tartrate (LOPRESSOR) 25 MG tablet Take 1 tablet (25 mg total) by mouth 2 (two) times  daily. 11/16/15   Katheren Shams, DO  nitroGLYCERIN (NITROSTAT) 0.4 MG SL tablet Place 0.4 mg under the tongue every 5 (five) minutes as needed for chest pain.    Historical Provider, MD  pantoprazole (PROTONIX) 40 MG tablet TAKE 1 TABLET (40 MG TOTAL) BY MOUTH DAILY. 11/16/15   Katheren Shams, DO  promethazine (PHENERGAN) 25 MG suppository Place 1 suppository (25 mg total) rectally every 6 (six) hours as needed for nausea or vomiting. 11/16/15   Katheren Shams, DO  zolpidem (AMBIEN) 5 MG tablet Take 1 tablet (5 mg total) by mouth at bedtime as needed for sleep. 07/01/16   Virginia Crews, MD    Family History Family History  Problem Relation Age of Onset  . Heart disease Mother 37    MI  . Diabetes Maternal Grandmother   . Heart failure Maternal Grandmother     CHF  . Diabetes Sister   . Diabetes Sister     gestational  . Hypertension Other     entire family  . Colon cancer Neg Hx   . Esophageal cancer Neg Hx   . Stomach cancer Neg Hx     Social History Social History  Substance Use Topics  . Smoking status: Former Smoker    Types: Cigarettes    Quit date: 03/26/1991  . Smokeless tobacco: Never Used  . Alcohol use Yes     Comment: 'SOCIALLY"     Allergies   Zofran [ondansetron hcl]   Review of Systems Review of Systems  Constitutional: Positive for appetite change. Negative for chills and fever.  Respiratory: Negative for shortness of breath.   Cardiovascular: Negative for chest pain.  Gastrointestinal: Positive for abdominal pain, nausea and vomiting. Negative for diarrhea.  Genitourinary: Positive for frequency. Negative for dysuria, pelvic pain, vaginal bleeding and vaginal discharge.  Musculoskeletal:       +cramping  Neurological: Positive for light-headedness. Negative for syncope.  All other systems reviewed and are negative.    Physical Exam Updated Vital Signs BP (!) 166/96 (BP Location: Left Arm)   Pulse 63   Temp 98.6 F (37 C) (Oral)   Resp 16    Ht 5\' 8"  (1.727 m)   Wt 107 kg   SpO2 100%   BMI 35.88 kg/m   Physical Exam  Constitutional: She is oriented to person, place, and time. She appears well-developed and well-nourished. No distress.  HENT:  Head: Normocephalic and atraumatic.  Eyes: Conjunctivae are normal. Pupils are equal, round, and reactive to light. Right eye exhibits no discharge. Left eye exhibits no discharge. No scleral icterus.  Neck: Normal range of motion.  Cardiovascular: Normal rate and regular rhythm.  Exam reveals no gallop and no friction  rub.   No murmur heard. Pulmonary/Chest: Effort normal and breath sounds normal. No respiratory distress. She has no wheezes. She has no rales. She exhibits no tenderness.  Abdominal: Soft. Bowel sounds are normal. She exhibits no distension and no mass. There is tenderness (generalized tenderness - pt points to epigastric area). There is no rebound and no guarding. No hernia.  Neurological: She is alert and oriented to person, place, and time.  Skin: Skin is warm and dry.  Psychiatric: She has a normal mood and affect. Her behavior is normal.  Nursing note and vitals reviewed.    ED Treatments / Results  Labs (all labs ordered are listed, but only abnormal results are displayed) Labs Reviewed  COMPREHENSIVE METABOLIC PANEL - Abnormal; Notable for the following:       Result Value   Potassium 2.8 (*)    Chloride 97 (*)    Glucose, Bld 158 (*)    Creatinine, Ser 1.13 (*)    Total Protein 6.3 (*)    GFR calc non Af Amer 53 (*)    All other components within normal limits  CBC - Abnormal; Notable for the following:    WBC 12.3 (*)    All other components within normal limits  URINALYSIS, ROUTINE W REFLEX MICROSCOPIC - Abnormal; Notable for the following:    APPearance HAZY (*)    Glucose, UA 50 (*)    Hgb urine dipstick SMALL (*)    Ketones, ur 5 (*)    Protein, ur 100 (*)    Bacteria, UA RARE (*)    Squamous Epithelial / LPF 6-30 (*)    All other  components within normal limits  LIPASE, BLOOD    EKG  EKG Interpretation None       Radiology No results found.  Procedures Procedures (including critical care time)  Medications Ordered in ED Medications  potassium chloride 30 mEq in sodium chloride 0.9 % 265 mL (KCL MULTIRUN) IVPB (not administered)  HYDROmorphone (DILAUDID) injection 0.5 mg (0.5 mg Intravenous Given 07/20/16 0747)  promethazine (PHENERGAN) injection 12.5 mg (12.5 mg Intravenous Given 07/20/16 0746)  sodium chloride 0.9 % bolus 1,000 mL (1,000 mLs Intravenous New Bag/Given 07/20/16 0747)     Initial Impression / Assessment and Plan / ED Course  I have reviewed the triage vital signs and the nursing notes.  Pertinent labs & imaging results that were available during my care of the patient were reviewed by me and considered in my medical decision making (see chart for details).  56 year old female with abdominal pain, N/V, hypokalemia. She is hypertensive but otherwise vitals are normal. Abdomen is soft and has generalized tenderness. Do not feel imaging would be helpful at this time since this is a recurrent problem. CBC remarkable for leukocytosis of 12.3. CMP remarkable for hypochloremia (97), hypokalemia (2.8), hyperglycemia (158). SCr is elevated but at baseline (1.13). UA does not appear infected. Will start anti-emetics, pain medicine, potassium, fluids and reassess.  After medication, pt reports feeling much better. She is tolerating PO. Will d/c with Phenergan suppository, pain medicine, and GI follow up. Recommended high K diet. She verbalized understanding. Return precautions given.  Final Clinical Impressions(s) / ED Diagnoses   Final diagnoses:  Epigastric abdominal pain  Non-intractable vomiting with nausea, unspecified vomiting type  Hypokalemia    New Prescriptions New Prescriptions   No medications on file     Recardo Evangelist, PA-C 07/20/16 [place], MD 07/20/16  2205

## 2016-07-20 NOTE — Discharge Instructions (Signed)
Please follow up with Dr. Ardis Hughs Take pain medicine as needed Use suppository as needed Eat potassium rich foods

## 2016-07-21 ENCOUNTER — Emergency Department (HOSPITAL_COMMUNITY)
Admission: EM | Admit: 2016-07-21 | Discharge: 2016-07-21 | Disposition: A | Discharge status: Home / Self Care | Payer: Medicare Other | Attending: Emergency Medicine | Admitting: Emergency Medicine

## 2016-07-21 ENCOUNTER — Emergency Department (HOSPITAL_COMMUNITY): Payer: Medicare Other

## 2016-07-21 ENCOUNTER — Encounter (HOSPITAL_COMMUNITY): Payer: Self-pay | Admitting: Emergency Medicine

## 2016-07-21 DIAGNOSIS — R1013 Epigastric pain: Secondary | ICD-10-CM | POA: Insufficient documentation

## 2016-07-21 DIAGNOSIS — E876 Hypokalemia: Secondary | ICD-10-CM | POA: Diagnosis not present

## 2016-07-21 DIAGNOSIS — F12188 Cannabis abuse with other cannabis-induced disorder: Secondary | ICD-10-CM | POA: Diagnosis not present

## 2016-07-21 DIAGNOSIS — I2510 Atherosclerotic heart disease of native coronary artery without angina pectoris: Secondary | ICD-10-CM | POA: Diagnosis not present

## 2016-07-21 DIAGNOSIS — R1110 Vomiting, unspecified: Secondary | ICD-10-CM | POA: Diagnosis present

## 2016-07-21 DIAGNOSIS — Z87891 Personal history of nicotine dependence: Secondary | ICD-10-CM | POA: Diagnosis not present

## 2016-07-21 DIAGNOSIS — I10 Essential (primary) hypertension: Secondary | ICD-10-CM | POA: Diagnosis not present

## 2016-07-21 DIAGNOSIS — Z79899 Other long term (current) drug therapy: Secondary | ICD-10-CM | POA: Diagnosis not present

## 2016-07-21 HISTORY — DX: Atherosclerotic heart disease of native coronary artery without angina pectoris: I25.10

## 2016-07-21 HISTORY — DX: Pain in unspecified shoulder: M25.519

## 2016-07-21 LAB — COMPREHENSIVE METABOLIC PANEL
ALT: 39 U/L (ref 14–54)
AST: 46 U/L — ABNORMAL HIGH (ref 15–41)
Albumin: 4.3 g/dL (ref 3.5–5.0)
Alkaline Phosphatase: 51 U/L (ref 38–126)
Anion gap: 10 (ref 5–15)
BUN: 11 mg/dL (ref 6–20)
CO2: 30 mmol/L (ref 22–32)
Calcium: 8.5 mg/dL — ABNORMAL LOW (ref 8.9–10.3)
Chloride: 96 mmol/L — ABNORMAL LOW (ref 101–111)
Creatinine, Ser: 1.37 mg/dL — ABNORMAL HIGH (ref 0.44–1.00)
GFR calc Af Amer: 48 mL/min — ABNORMAL LOW (ref >60)
GFR calc non Af Amer: 42 mL/min — ABNORMAL LOW (ref >60)
Glucose, Bld: 164 mg/dL — ABNORMAL HIGH (ref 65–99)
Potassium: 2.9 mmol/L — ABNORMAL LOW (ref 3.5–5.1)
Sodium: 136 mmol/L (ref 135–145)
Total Bilirubin: 1.3 mg/dL — ABNORMAL HIGH (ref 0.3–1.2)
Total Protein: 7.2 g/dL (ref 6.5–8.1)

## 2016-07-21 LAB — CBC
HCT: 44.1 % (ref 36.0–46.0)
Hemoglobin: 14.9 g/dL (ref 12.0–15.0)
MCH: 30.3 pg (ref 26.0–34.0)
MCHC: 33.8 g/dL (ref 30.0–36.0)
MCV: 89.8 fL (ref 78.0–100.0)
Platelets: 250 10*3/uL (ref 150–400)
RBC: 4.91 MIL/uL (ref 3.87–5.11)
RDW: 15.3 % (ref 11.5–15.5)
WBC: 15.3 10*3/uL — ABNORMAL HIGH (ref 4.0–10.5)

## 2016-07-21 LAB — MAGNESIUM: Magnesium: 1.7 mg/dL (ref 1.7–2.4)

## 2016-07-21 LAB — LIPASE, BLOOD: Lipase: 23 U/L (ref 11–51)

## 2016-07-21 LAB — URINALYSIS, ROUTINE W REFLEX MICROSCOPIC
Bilirubin Urine: NEGATIVE
Glucose, UA: NEGATIVE mg/dL
Hgb urine dipstick: NEGATIVE
Ketones, ur: 5 mg/dL — AB
Leukocytes, UA: NEGATIVE
Nitrite: NEGATIVE
Protein, ur: NEGATIVE mg/dL
Specific Gravity, Urine: >1.046 — ABNORMAL HIGH (ref 1.005–1.030)
pH: 6 (ref 5.0–8.0)

## 2016-07-21 MED ORDER — IOPAMIDOL (ISOVUE-300) INJECTION 61%
INTRAVENOUS | Status: AC
  Administered 2016-07-21: 100 mL via INTRAVENOUS
  Filled 2016-07-21: qty 100

## 2016-07-21 MED ORDER — SODIUM CHLORIDE 0.9 % IV BOLUS (SEPSIS)
1000.0000 mL | Freq: Once | INTRAVENOUS | Status: AC
  Administered 2016-07-21: 1000 mL via INTRAVENOUS

## 2016-07-21 MED ORDER — SODIUM CHLORIDE 0.9 % IV SOLN: 30.0000 meq | Freq: Once | INTRAVENOUS | Status: DC

## 2016-07-21 MED ORDER — POTASSIUM CHLORIDE 10 MEQ/100ML IV SOLN
10.0000 meq | INTRAVENOUS | Status: AC
  Administered 2016-07-21 (×3): 10 meq via INTRAVENOUS
  Filled 2016-07-21 (×3): qty 100

## 2016-07-21 MED ORDER — POTASSIUM CHLORIDE ER 10 MEQ PO TBCR: 20.0000 meq | EXTENDED_RELEASE_TABLET | Freq: Two times a day (BID) | ORAL | 0 refills | Status: DC

## 2016-07-21 MED ORDER — HYDROMORPHONE HCL 1 MG/ML IJ SOLN
1.0000 mg | Freq: Once | INTRAMUSCULAR | Status: AC
Start: 2016-07-21 — End: 2016-07-21
  Administered 2016-07-21: 1 mg via INTRAVENOUS
  Filled 2016-07-21: qty 1

## 2016-07-21 MED ORDER — HALOPERIDOL LACTATE 5 MG/ML IJ SOLN
2.0000 mg | Freq: Once | INTRAMUSCULAR | Status: AC
  Administered 2016-07-21: 2 mg via INTRAVENOUS
  Filled 2016-07-21: qty 1

## 2016-07-21 MED ORDER — PROMETHAZINE HCL 25 MG/ML IJ SOLN
25.0000 mg | Freq: Once | INTRAMUSCULAR | Status: AC
  Administered 2016-07-21: 25 mg via INTRAVENOUS
  Filled 2016-07-21: qty 1

## 2016-07-21 NOTE — Discharge Instructions (Signed)
Take your medications as prescribed including your pain medication and phenergan suppository prescribed to you yesterday from your visit to the emergency department.  Please follow up with a primary care provider from the Resource Guide provided below in 3-4 days for follow up evaluation. I recommend having repeat blood work performed within the next week to recheck your potassium (K 2.9 in the ED today) and kidney function (Cr 1.37 in the ED). I also recommend following up regarding your abnormal findings on your CT scan regarding your spine. Follow up with the gastroenterology clinic listed below if your symptoms continue over the next week. Please return to the Emergency Department if symptoms worsen or new onset of fever, chest pain, difficulty breathing, new/worsening abdominal pain, vomiting, unable to keep fluids down, blood in emesis or stool.

## 2016-07-21 NOTE — ED Notes (Signed)
ED Provider at bedside. 

## 2016-07-21 NOTE — ED Notes (Signed)
Attempted IV stick x 2 without success by this RN

## 2016-07-21 NOTE — ED Notes (Signed)
Pt from home with complaints of abdominal pain and emesis that began Friday. Pt was seen at [facility] for this yesterday and given phenergan suppositories. Pt states this has been ineffective in controlling her emesis and pain. Pt is allergic to zofran and does not like morphine. Pt states her abdominal pain is upper and across the middle. She describes it as a gnawing and a sharp pain. Pt has had 20 episodes of emesis today. She is hypertensive and tachycardic at time of assessment

## 2016-07-21 NOTE — ED Provider Notes (Signed)
[facility] DEPT Provider Note   CSN: [id number] Arrival date & time: 07/21/16  1440     History   Chief Complaint Chief Complaint  Patient presents with  . Emesis  . Abdominal Pain    HPI Shannon Stuart is a 56 y.o. female.  HPI   Patient is a 55 year old female with history of hypertension, hyperlipidemia, GERD, CAD who presents to the ED with complaint of continued vomiting and abdominal pain. Patient reports her symptoms started Friday morning and reports having multiple episodes of yellow/green bilious emesis since Friday. She also reports having constant sharp aching pain to her upper abdomen which she notes is worse when vomiting. Patient states she was evaluated in the ED yesterday for her symptoms, was given antiemetics and pain meds with improvement and was discharged home with Phenergan suppository. Patient reports a few hours after returning home her symptoms returned. She states she has been taking her pending and suppository as prescribed without relief of vomiting. Endorses associated chills and generalized body ache. Denies fever, headache, nasal congestion, rhinorrhea, cough, shortness of breath, wheezing, chest pain, hematemesis, diarrhea, constipation, urinary symptoms, flank pain, vaginal bleeding or discharge. Patient states she has been unable to keep anything down since returning home from the ED yesterday. Patient endorses smoking marijuana daily and states she has been smoking for years. Endorses abdominal surgical history of cholecystectomy. Patient reports having routine colonoscopy performed in 2016 which was unremarkable.  Past Medical History:  Diagnosis Date  . Allergy   . Anxiety    a. takes mostly daily klonopin  . Biliary dyskinesia    a. 09/2013 s/p Lap Chole ([place]).  . Chest pain at rest   . Chronic low back pain   . Coronary artery disease   . Depression   . Endometriosis   . GERD (gastroesophageal reflux disease)   . Headache(784.0)   .  History of pneumonia   . Hyperlipidemia    a. 07/2013 LDL 126 - not on statin.  Marland Kitchen Hypertension   . Obesity, Class II, BMI 35-39.9   . Osteoarthritis    a. bilateral knees.  . Shoulder pain     Patient Active Problem List   Diagnosis Date Noted  . Non-obstructive CAD   . Obesity, Class II, BMI 35-39.9   . Chronic low back pain 10/12/2013  . Well woman exam 09/02/2013  . Prediabetes 06/02/2013  . Insomnia 08/18/2012  . Arthritis of both knees 04/11/2011  . Depression 08/10/2010  . HLD (hyperlipidemia) 08/04/2009  . Leiomyoma of uterus 04/29/2008  . Dysfunctional uterine bleeding 04/26/2008  . Anxiety state 09/15/2006  . RHINITIS, ALLERGIC NOS 09/08/2006  . GERD 09/08/2006  . HYPERTENSION, BENIGN ESSENTIAL 08/04/2006    Past Surgical History:  Procedure Laterality Date  . BRAIN TUMOR EXCISION    . CHOLECYSTECTOMY N/A 10/21/2013   Procedure: LAPAROSCOPIC CHOLECYSTECTOMY WITH INTRAOPERATIVE CHOLANGIOGRAM;  Surgeon: Imogene Burn. Georgette Dover, MD;  Location: [facility];  Service: General;  Laterality: N/A;  . LUMBAR [place] SURGERY  04/1986, 12/1986   x 2  . TISSUE GRAFT    . TONSILLECTOMY      OB History    Gravida Para Term Preterm AB Living   '1 1 1     1   '$ SAB TAB Ectopic Multiple Live Births                   Home Medications    Prior to Admission medications   Medication Sig Start Date End Date Taking?  Authorizing Provider  ALPRAZolam Duanne Moron) 1 MG tablet Take 1 mg by mouth 3 (three) times daily. 06/24/16  Yes Historical Provider, MD  cyclobenzaprine (FLEXERIL) 10 MG tablet TAKE 1 TABLET (10 MG TOTAL) BY MOUTH 3 (THREE) TIMES DAILY AS NEEDED FOR MUSCLE SPASMS. 11/16/15  Yes Katheren Shams, DO  ibuprofen (ADVIL,MOTRIN) 800 MG tablet Take 1 tablet (800 mg total) by mouth every 8 (eight) hours as needed for moderate pain. 04/09/16  Yes Virginia Crews, MD  lisinopril-hydrochlorothiazide (PRINZIDE,ZESTORETIC) 20-25 MG tablet Take 1 tablet by mouth daily. 11/16/15 02/15/17 Yes Katheren Shams, DO  metoprolol tartrate (LOPRESSOR) 25 MG tablet Take 1 tablet (25 mg total) by mouth 2 (two) times daily. 11/16/15  Yes Katheren Shams, DO  oxyCODONE-acetaminophen (PERCOCET) 10-325 MG tablet Take 1 tablet by mouth 3 (three) times daily. 07/12/16  Yes Historical Provider, MD  pantoprazole (PROTONIX) 40 MG tablet TAKE 1 TABLET (40 MG TOTAL) BY MOUTH DAILY. 11/16/15  Yes Katheren Shams, DO  promethazine (PHENERGAN) 25 MG suppository Place 1 suppository (25 mg total) rectally every 6 (six) hours as needed for nausea or vomiting. 07/20/16  Yes Recardo Evangelist, PA-C  zolpidem (AMBIEN) 5 MG tablet Take 1 tablet (5 mg total) by mouth at bedtime as needed for sleep. 07/01/16  Yes Virginia Crews, MD  HYDROcodone-acetaminophen (NORCO/VICODIN) 5-325 MG tablet Take 1 tablet by mouth every 6 (six) hours as needed for severe pain. Patient not taking: Reported on 07/21/2016 07/20/16   Recardo Evangelist, PA-C  ibuprofen (ADVIL,MOTRIN) 800 MG tablet TAKE 1 TABLET (800 MG TOTAL) BY MOUTH EVERY 8 (EIGHT) HOURS AS NEEDED FOR MODERATE PAIN. Patient not taking: Reported on 07/21/2016 04/26/16   Virginia Crews, MD  medroxyPROGESTERone (DEPO-PROVERA) 150 MG/ML injection Inject 1 mL (150 mg total) into the muscle every 3 (three) months. Patient not taking: Reported on 07/21/2016 03/04/12   [place], DO  potassium chloride (K-DUR) 10 MEQ tablet Take 2 tablets (20 mEq total) by mouth 2 (two) times daily. 07/21/16 07/24/16  Nona Dell, PA-C    Family History Family History  Problem Relation Age of Onset  . Heart disease Mother 12    MI  . Diabetes Maternal Grandmother   . Heart failure Maternal Grandmother     CHF  . Diabetes Sister   . Diabetes Sister     gestational  . Hypertension Other     entire family  . Colon cancer Neg Hx   . Esophageal cancer Neg Hx   . Stomach cancer Neg Hx     Social History Social History  Substance Use Topics  . Smoking status: Former Smoker    Types:  Cigarettes    Quit date: 03/26/1991  . Smokeless tobacco: Never Used  . Alcohol use Yes     Comment: 'SOCIALLY"     Allergies   Zofran [ondansetron hcl] and Morphine and related   Review of Systems Review of Systems  Constitutional: Positive for chills.  Gastrointestinal: Positive for abdominal pain, nausea and vomiting.  Musculoskeletal: Positive for myalgias (generalized).  All other systems reviewed and are negative.    Physical Exam Updated Vital Signs BP (!) 138/91   Pulse 68   Temp 98.7 F (37.1 C) (Oral)   Resp 18   Ht 5' 8.5" (1.74 m)   Wt 102.1 kg   SpO2 93%   BMI 33.71 kg/m   Physical Exam  Constitutional: She is oriented to person, place, and time.  She appears well-developed and well-nourished. No distress.  HENT:  Head: Normocephalic and atraumatic.  Mouth/Throat: Uvula is midline, oropharynx is clear and moist and mucous membranes are normal. No oropharyngeal exudate, posterior oropharyngeal edema, posterior oropharyngeal erythema or tonsillar abscesses. No tonsillar exudate.  Eyes: Conjunctivae and EOM are normal. Right eye exhibits no discharge. Left eye exhibits no discharge. No scleral icterus.  Neck: Normal range of motion. Neck supple.  Cardiovascular: Normal rate, regular rhythm, normal heart sounds and intact distal pulses.   HR 72  Pulmonary/Chest: Effort normal and breath sounds normal. No respiratory distress. She has no wheezes. She has no rales. She exhibits no tenderness.  Abdominal: Soft. Normal appearance and bowel sounds are normal. She exhibits no distension and no mass. There is tenderness. There is no rigidity, no rebound, no guarding and no CVA tenderness. No hernia.  Mild diffuse tenderness, worse over epigastric region  Musculoskeletal: Normal range of motion. She exhibits no edema.  Neurological: She is alert and oriented to person, place, and time.  Skin: Skin is warm and dry. She is not diaphoretic.  Nursing note and vitals  reviewed.    ED Treatments / Results  Labs (all labs ordered are listed, but only abnormal results are displayed) Labs Reviewed  COMPREHENSIVE METABOLIC PANEL - Abnormal; Notable for the following:       Result Value   Potassium 2.9 (*)    Chloride 96 (*)    Glucose, Bld 164 (*)    Creatinine, Ser 1.37 (*)    Calcium 8.5 (*)    AST 46 (*)    Total Bilirubin 1.3 (*)    GFR calc non Af Amer 42 (*)    GFR calc Af Amer 48 (*)    All other components within normal limits  CBC - Abnormal; Notable for the following:    WBC 15.3 (*)    All other components within normal limits  URINALYSIS, ROUTINE W REFLEX MICROSCOPIC - Abnormal; Notable for the following:    Specific Gravity, Urine >1.046 (*)    Ketones, ur 5 (*)    All other components within normal limits  LIPASE, BLOOD  MAGNESIUM    EKG  EKG Interpretation None       Radiology Ct Abdomen Pelvis W Contrast  Result Date: 07/21/2016 CLINICAL DATA:  Pt from home with complaints of abdominal pain and emesis that began Friday. Pt was seen at [facility] for this yesterday and given phenergan suppositories. Pt states this has been ineffective in controlling her emesis and pain. Pt is allergic to zofran and does not like morphine. Pt states her abdominal pain is upper and across the middle. She describes it as a gnawing and a sharp pain. Pt has had 20 episodes of emesis today. She is hypertensive and tachycardic. EXAM: CT ABDOMEN AND PELVIS WITH CONTRAST TECHNIQUE: Multidetector CT imaging of the abdomen and pelvis was performed using the standard protocol following bolus administration of intravenous contrast. CONTRAST:  141m ISOVUE-300 IOPAMIDOL (ISOVUE-300) INJECTION 61% COMPARISON:  08/11/2014 FINDINGS: Lower chest: Clear lung bases.  Heart is normal in size. Hepatobiliary: Mild fatty infiltration of the liver. No liver mass or focal lesion. Gallbladder surgically absent. No bile duct dilation. Pancreas: Unremarkable. No pancreatic ductal  dilatation or surrounding inflammatory changes. Spleen: Normal in size without focal abnormality. Adrenals/Urinary Tract: No adrenal masses. 2 subcentimeter right renal lesions, low in attenuation, statistically most likely cysts, stable from the prior CT. No other renal masses or lesions. No stones. No hydronephrosis. Ureters  normal in course and in caliber. Bladder is unremarkable. Stomach/Bowel: Stomach is within normal limits. Appendix appears normal. No evidence of bowel wall thickening, distention, or inflammatory changes. Vascular/Lymphatic: No significant vascular findings are present. No enlarged abdominal or pelvic lymph nodes. Reproductive: Uterus is heterogeneous. There are contour bulges from the anterior and posterior aspects of the uterus consistent with fibroids. No adnexal masses. Other: No abdominal wall hernia or abnormality. No abdominopelvic ascites. Musculoskeletal: No acute fracture. Postsurgical changes at the L5-S1 level. Bones have a somewhat mottled appearance subtle small lucencies, most evident along the spine, which are more apparent than on the prior study. IMPRESSION: 1. No acute findings within the abdomen or pelvis. 2. Multiple small lucencies evident along the visualized spine. These raise the possibility of multiple myeloma. Consider MRI for further evaluation. 3. Hepatic steatosis. 4. Uterine fibroids. 5. Status post cholecystectomy. Electronically Signed   By: Lajean Manes M.D.   On: 07/21/2016 17:42    Procedures Procedures (including critical care time)  Medications Ordered in ED Medications  sodium chloride 0.9 % bolus 1,000 mL (0 mLs Intravenous Stopped 07/21/16 1928)  HYDROmorphone (DILAUDID) injection 1 mg (1 mg Intravenous Given 07/21/16 1652)  promethazine (PHENERGAN) injection 25 mg (25 mg Intravenous Given 07/21/16 1652)  potassium chloride 10 mEq in 100 mL IVPB (0 mEq Intravenous Stopped 07/21/16 2105)  iopamidol (ISOVUE-300) 61 % injection (100 mLs  Intravenous Contrast Given 07/21/16 1715)  haloperidol lactate (HALDOL) injection 2 mg (2 mg Intravenous Given 07/21/16 1747)  sodium chloride 0.9 % bolus 1,000 mL (0 mLs Intravenous Stopped 07/21/16 2105)     Initial Impression / Assessment and Plan / ED Course  I have reviewed the triage vital signs and the nursing notes.  Pertinent labs & imaging results that were available during my care of the patient were reviewed by me and considered in my medical decision making (see chart for details).    Pt presents with continued upper abdominal pain with multiple episodes of vomiting. Sxs started 2 days ago. Pt seen in ED yesterday for sxs, improved after IV pain meds and antiemetics and was d/c home with phenergan suppository without relief of sxs today. Pt endorses smoking marijuana daily for the past few years. VSS. Exam revealed mild diffuse abdominal tenderness, worse over epigastric region, MMM. RRR. Lungs CTAB. Remaining exam unremarkable. Pt given IVF, phenergan and dilaudid.   Labs showed WBC 15.3, slightly increased from yesterday (WBC 12.3). K 2.9 (yesterday K 2.8, pt given IV potassium supplement prior to d/c), nml mag, pt given IV potassium supplement in the ED. Cr 1.37 (yesterday Cr 1.13). Cl 96. UA showed few ketones, otherwise unremarkable.  Due to pt with continued sxs, abdominal tenderness on exam and slightly increased leukocytosis, will order CT abdomen for further evaluation. CT showed no acute findings; multiple small lucencies along spine raising concern for possible multiple myeloma. Pt without midline tenderness on exam. Discussed findings and advised pt to follow up with PCP. On reevaluation pt reports mild improvement of sxs but is requesting another dose of medication. Pt given IV haldol for suspected hyperemesis cannabis/cyclical vomiting.   On reevaluation, pt reports feeling significantly better. Tolerating PO. Plan to d/c pt home with symptomatic tx, advised her to take her  rx of phenergan and hydrocodone she received in the ED yesterday as prescribed. Plan to have pt follow up PCP this week, discussed importance of decreasing/stopping marijuana use. Advised pt to have blood work rechecked this week reguarding mild elevated of Cr  and hypokalemia. Pt d/c home with rx of potassium tablets. Discussed strict return precautions.   Final Clinical Impressions(s) / ED Diagnoses   Final diagnoses:  Cannabis hyperemesis syndrome concurrent with and due to cannabis abuse ([place])  Hypokalemia    New Prescriptions Discharge Medication List as of 07/21/2016  9:06 PM    START taking these medications   Details  potassium chloride (K-DUR) 10 MEQ tablet Take 2 tablets (20 mEq total) by mouth 2 (two) times daily., Starting Sun 07/21/2016, Until Wed 07/24/2016, Print         Nona Dell, [place] 07/21/16 2118    Dorie Rank, MD 07/24/16 1310

## 2016-08-21 ENCOUNTER — Other Ambulatory Visit: Payer: Self-pay | Admitting: Family Medicine

## 2016-08-21 DIAGNOSIS — M545 Low back pain, unspecified: Secondary | ICD-10-CM

## 2016-08-21 DIAGNOSIS — G8929 Other chronic pain: Secondary | ICD-10-CM

## 2016-08-30 ENCOUNTER — Ambulatory Visit: Payer: Medicare Other | Admitting: Family Medicine

## 2016-08-30 ENCOUNTER — Encounter: Payer: Self-pay | Admitting: Family Medicine

## 2016-08-30 ENCOUNTER — Ambulatory Visit (INDEPENDENT_AMBULATORY_CARE_PROVIDER_SITE_OTHER): Payer: Medicare Other | Admitting: Family Medicine

## 2016-08-30 VITALS — BP 144/100 | HR 78 | Temp 99.2°F | Ht 68.0 in | Wt 229.8 lb

## 2016-08-30 DIAGNOSIS — I10 Essential (primary) hypertension: Secondary | ICD-10-CM | POA: Diagnosis not present

## 2016-08-30 DIAGNOSIS — E876 Hypokalemia: Secondary | ICD-10-CM | POA: Diagnosis not present

## 2016-08-30 DIAGNOSIS — R7303 Prediabetes: Secondary | ICD-10-CM | POA: Diagnosis not present

## 2016-08-30 DIAGNOSIS — R7989 Other specified abnormal findings of blood chemistry: Secondary | ICD-10-CM | POA: Diagnosis not present

## 2016-08-30 DIAGNOSIS — M899 Disorder of bone, unspecified: Secondary | ICD-10-CM | POA: Diagnosis not present

## 2016-08-30 DIAGNOSIS — I25119 Atherosclerotic heart disease of native coronary artery with unspecified angina pectoris: Secondary | ICD-10-CM

## 2016-08-30 DIAGNOSIS — E669 Obesity, unspecified: Secondary | ICD-10-CM

## 2016-08-30 DIAGNOSIS — G4700 Insomnia, unspecified: Secondary | ICD-10-CM | POA: Diagnosis not present

## 2016-08-30 MED ORDER — METOPROLOL TARTRATE 25 MG PO TABS: 25.0000 mg | ORAL_TABLET | Freq: Two times a day (BID) | ORAL | 2 refills | Status: DC

## 2016-08-30 MED ORDER — ZOLPIDEM TARTRATE 5 MG PO TABS: 5.0000 mg | ORAL_TABLET | Freq: Every evening | ORAL | 1 refills | Status: DC | PRN

## 2016-08-30 MED ORDER — LISINOPRIL-HYDROCHLOROTHIAZIDE 20-25 MG PO TABS: 1.0000 | ORAL_TABLET | Freq: Every day | ORAL | 3 refills | Status: DC

## 2016-08-30 NOTE — Assessment & Plan Note (Signed)
Incidentally found multiple lucencies of bones on CT abdomen and pelvis, most notably in the spine There is some concern from the radiologist about possibility of multiple myeloma Patient is of the right age range for multiple myeloma and does have chronic back pain It is reassuring, however that her last MRI was in 2016 (we will get records from [place]) and no one mentioned any lucencies at that time and her back pain is unchanged for many years I will also recheck CMP and CBC at this time - on last check 6 weeks ago she had no anemia, hypercalcemia She did have recent slight elevation in her creatinine but this was likely due to dehydration Also obtaining SPEP and new Pap and free light chains We will also get MRI of C, T, L spines with contrast to further evaluate these lucencies on CT I will have the patient follow-up in about 2-3 weeks after MRI and laboratory testing

## 2016-08-30 NOTE — Assessment & Plan Note (Signed)
Recheck electrolytes Likely due to vomiting

## 2016-08-30 NOTE — Patient Instructions (Addendum)
Nice to see you again today.  We will schedule you in nutrition clinic on 6/28 (thursday) at 10 am with me. Keep up the good work with diet and exercise.  We are getting some labs and imaging today to investigate further.  We will make a plan after we get this labs.  Take care, Dr. Jacinto Reap

## 2016-08-30 NOTE — Assessment & Plan Note (Signed)
Patient is working toward weight loss and healthier lifestyle Discussed diet and exercise I will see her in nutrition clinic later this month to discuss more in depth food choices and options Congratulated her on the 7 pounds of weight loss that she has had since the last time I saw her 2 months ago

## 2016-08-30 NOTE — Assessment & Plan Note (Signed)
Refilled Ambien Could consider further tapering the future

## 2016-08-30 NOTE — Progress Notes (Signed)
Subjective:   Shannon Stuart is a 56 y.o. female with a history of Chronic back pain, obesity, prediabetes, insomnia, HTN here for prediabetes and obesity follow-up  Spinal lucencies noted on CT - patient was seen in ED 4/29 for cannabis hyperemesis syndrome - CT abd/pelvis showed benign abd cavity, but multiple lucencies in spine concerning for possible multiple myeloma - at the same time, was noted to have mild elevation in Cr and hypokalemia - denies unintentional weight loss, night sweats - continues to have diffuse back pain that is unchanged for many years - No weakness or numbness in lower extremities, no incontinence  Pre-diabetes, obesity - last A1c 6.3 in 06/2016 - diet: eating a lot of salads, but wants more variety, interested in nutrition clinic - exercise: going to gym and swimming and doing water aerobics 3 times weekly, walking 1.5 miles daily - denies polyuria, polydipsia, chest pain, shortness of breath, lower extremity edema, lightheadedness  Insomnia - taking ambien intermittently - it helps her sleep  Review of Systems:  Per HPI.   Social History: former smoker  Objective:  BP (!) 144/100   Pulse 78   Temp 99.2 F (37.3 C) (Oral)   Ht '5\' 8"'$  (1.727 m)   Wt 229 lb 12.8 oz (104.2 kg)   SpO2 97%   BMI 34.94 kg/m   Gen:  56 y.o. female in NAD  HEENT: NCAT, MMM, anicteric sclerae Neck: No LAD CV: RRR, no MRG Resp: Non-labored, CTAB, no wheezes noted Ext: WWP, no edema MSK: Diffuse back TTP, gait is slow Neuro: Alert and oriented, speech normal       Chemistry      Component Value Date/Time   NA 136 07/21/2016 1506   NA 144 07/01/2016 1448   K 2.9 (L) 07/21/2016 1506   CL 96 (L) 07/21/2016 1506   CO2 30 07/21/2016 1506   BUN 11 07/21/2016 1506   BUN 16 07/01/2016 1448   CREATININE 1.37 (H) 07/21/2016 1506   CREATININE 1.14 (H) 10/31/2015 1045      Component Value Date/Time   CALCIUM 8.5 (L) 07/21/2016 1506   ALKPHOS 51 07/21/2016 1506   AST  46 (H) 07/21/2016 1506   ALT 39 07/21/2016 1506   BILITOT 1.3 (H) 07/21/2016 1506      Lab Results  Component Value Date   WBC 15.3 (H) 07/21/2016   HGB 14.9 07/21/2016   HCT 44.1 07/21/2016   MCV 89.8 07/21/2016   PLT 250 07/21/2016   Lab Results  Component Value Date   TSH 1.01 11/05/2011   Lab Results  Component Value Date   HGBA1C 6.3 07/01/2016   Assessment & Plan:     REYA AURICH is a 56 y.o. female here for   Insomnia Refilled Ambien Could consider further tapering the future  Obesity, Class II, BMI 35-39.9 Patient is working toward weight loss and healthier lifestyle Discussed diet and exercise I will see her in nutrition clinic later this month to discuss more in depth food choices and options Congratulated her on the 7 pounds of weight loss that she has had since the last time I saw her 2 months ago  Prediabetes Last A1c 6.3 Long discussion about healthy diet and exercise Plan as above for obesity  Hypokalemia Recheck electrolytes Likely due to vomiting  Bone lesion Incidentally found multiple lucencies of bones on CT abdomen and pelvis, most notably in the spine There is some concern from the radiologist about possibility of multiple myeloma  Patient is of the right age range for multiple myeloma and does have chronic back pain It is reassuring, however that her last MRI was in 2016 (we will get records from [place]) and no one mentioned any lucencies at that time and her back pain is unchanged for many years I will also recheck CMP and CBC at this time - on last check 6 weeks ago she had no anemia, hypercalcemia She did have recent slight elevation in her creatinine but this was likely due to dehydration Also obtaining SPEP and new Pap and free light chains We will also get MRI of C, T, L spines with contrast to further evaluate these lucencies on CT I will have the patient follow-up in about 2-3 weeks after MRI and laboratory  testing    Virginia Crews, MD MPH PGY-3,  [facility] 08/30/2016  3:25 PM

## 2016-08-30 NOTE — Assessment & Plan Note (Signed)
Last A1c 6.3 Long discussion about healthy diet and exercise Plan as above for obesity

## 2016-09-02 ENCOUNTER — Encounter: Payer: Self-pay | Admitting: Family Medicine

## 2016-09-02 LAB — CMP14+EGFR
ALT: 18 IU/L (ref 0–32)
AST: 20 IU/L (ref 0–40)
Albumin/Globulin Ratio: 1.9 (ref 1.2–2.2)
Albumin: 3.6 g/dL (ref 3.5–5.5)
Alkaline Phosphatase: 53 IU/L (ref 39–117)
BUN/Creatinine Ratio: 13 (ref 9–23)
BUN: 15 mg/dL (ref 6–24)
Bilirubin Total: 0.3 mg/dL (ref 0.0–1.2)
CO2: 24 mmol/L (ref 18–29)
Calcium: 8.6 mg/dL — ABNORMAL LOW (ref 8.7–10.2)
Chloride: 107 mmol/L — ABNORMAL HIGH (ref 96–106)
Creatinine, Ser: 1.17 mg/dL — ABNORMAL HIGH (ref 0.57–1.00)
GFR calc Af Amer: 59 mL/min/{1.73_m2} — ABNORMAL LOW (ref >59)
GFR calc non Af Amer: 51 mL/min/{1.73_m2} — ABNORMAL LOW (ref >59)
Globulin, Total: 1.9 g/dL (ref 1.5–4.5)
Glucose: 105 mg/dL — ABNORMAL HIGH (ref 65–99)
Potassium: 3.7 mmol/L (ref 3.5–5.2)
Sodium: 144 mmol/L (ref 134–144)
Total Protein: 5.5 g/dL — ABNORMAL LOW (ref 6.0–8.5)

## 2016-09-02 LAB — CBC
Hematocrit: 41.9 % (ref 34.0–46.6)
Hemoglobin: 13.2 g/dL (ref 11.1–15.9)
MCH: 29 pg (ref 26.6–33.0)
MCHC: 31.5 g/dL (ref 31.5–35.7)
MCV: 92 fL (ref 79–97)
Platelets: 204 10*3/uL (ref 150–379)
RBC: 4.55 x10E6/uL (ref 3.77–5.28)
RDW: 15.7 % — ABNORMAL HIGH (ref 12.3–15.4)
WBC: 9 10*3/uL (ref 3.4–10.8)

## 2016-09-02 LAB — PROTEIN ELECTROPHORESIS, SERUM
A/G Ratio: 1.4 (ref 0.7–1.7)
Albumin ELP: 3.2 g/dL (ref 2.9–4.4)
Alpha 1: 0.3 g/dL (ref 0.0–0.4)
Alpha 2: 0.6 g/dL (ref 0.4–1.0)
Beta: 0.9 g/dL (ref 0.7–1.3)
Gamma Globulin: 0.5 g/dL (ref 0.4–1.8)
Globulin, Total: 2.3 g/dL (ref 2.2–3.9)

## 2016-09-04 LAB — UPEP/UIFE/LIGHT CHAINS/TP, 24-HR UR
% BETA, Urine: 32.9 %
ALBUMIN, U: 17 %
ALPHA 1 URINE: 4 %
ALPHA-2-GLOBULIN, U: 13.5 %
Free Kappa Lt Chains,Ur: 114 mg/L — ABNORMAL HIGH (ref 1.35–24.19)
Free Lambda Lt Chains,Ur: 3.19 mg/L (ref 0.24–6.66)
GAMMA GLOBULIN URINE: 32.5 %
Kappa/Lambda Ratio,U: 35.74 — ABNORMAL HIGH (ref 2.04–10.37)
Protein, 24H Urine: 174 mg/24 hr — ABNORMAL HIGH (ref 30–150)
Protein, Ur: 14.5 mg/dL

## 2016-09-10 ENCOUNTER — Ambulatory Visit (HOSPITAL_COMMUNITY): Payer: Medicare Other

## 2016-09-10 ENCOUNTER — Ambulatory Visit (HOSPITAL_COMMUNITY)
Admission: RE | Admit: 2016-09-10 | Discharge: 2016-09-10 | Disposition: A | Discharge status: Home / Self Care | Payer: Medicare Other | Source: Ambulatory Visit | Attending: Family Medicine | Admitting: Family Medicine

## 2016-09-10 DIAGNOSIS — M5136 Other intervertebral disc degeneration, lumbar region: Secondary | ICD-10-CM | POA: Insufficient documentation

## 2016-09-10 DIAGNOSIS — M48061 Spinal stenosis, lumbar region without neurogenic claudication: Secondary | ICD-10-CM | POA: Insufficient documentation

## 2016-09-10 DIAGNOSIS — M5124 Other intervertebral disc displacement, thoracic region: Secondary | ICD-10-CM | POA: Insufficient documentation

## 2016-09-10 DIAGNOSIS — M4302 Spondylolysis, cervical region: Secondary | ICD-10-CM | POA: Insufficient documentation

## 2016-09-10 DIAGNOSIS — M4802 Spinal stenosis, cervical region: Secondary | ICD-10-CM | POA: Insufficient documentation

## 2016-09-10 DIAGNOSIS — M899 Disorder of bone, unspecified: Secondary | ICD-10-CM | POA: Diagnosis not present

## 2016-09-10 MED ORDER — GADOBENATE DIMEGLUMINE 529 MG/ML IV SOLN
20.0000 mL | Freq: Once | INTRAVENOUS | Status: AC
  Administered 2016-09-10: 20 mL via INTRAVENOUS

## 2016-09-17 ENCOUNTER — Encounter: Payer: Self-pay | Admitting: Family Medicine

## 2016-09-17 ENCOUNTER — Ambulatory Visit (INDEPENDENT_AMBULATORY_CARE_PROVIDER_SITE_OTHER): Payer: Medicare Other | Admitting: Family Medicine

## 2016-09-17 VITALS — BP 102/78 | HR 95 | Temp 99.3°F | Ht 68.0 in | Wt 227.0 lb

## 2016-09-17 DIAGNOSIS — D8989 Other specified disorders involving the immune mechanism, not elsewhere classified: Secondary | ICD-10-CM | POA: Insufficient documentation

## 2016-09-17 DIAGNOSIS — G8929 Other chronic pain: Secondary | ICD-10-CM | POA: Diagnosis not present

## 2016-09-17 DIAGNOSIS — M899 Disorder of bone, unspecified: Secondary | ICD-10-CM | POA: Diagnosis not present

## 2016-09-17 DIAGNOSIS — M5440 Lumbago with sciatica, unspecified side: Secondary | ICD-10-CM

## 2016-09-17 DIAGNOSIS — I25119 Atherosclerotic heart disease of native coronary artery with unspecified angina pectoris: Secondary | ICD-10-CM

## 2016-09-17 MED ORDER — KETOROLAC TROMETHAMINE 30 MG/ML IJ SOLN
30.0000 mg | Freq: Once | INTRAMUSCULAR | Status: AC
  Administered 2016-09-17: 30 mg via INTRAMUSCULAR

## 2016-09-17 NOTE — Assessment & Plan Note (Signed)
Stable Suspect this is related to all the degenerative changes seen on MRI, rather than bone lesions as suspected on CT Given Toradol shot today, though extensively counseled on risks of continued NSAID use Encouraged her to continue with the exercise that she is working hard at

## 2016-09-17 NOTE — Progress Notes (Signed)
   Subjective:   Shannon Stuart is a 56 y.o. female with a history of HTN, CAD, GERD, HLD, anxiety, depression, chronic low back pain here for f/u spinal lucencies on CT  Spinal lucencies noted on CT - patient was seen in ED 4/29 for cannabis hyperemesis syndrome - CT abd/pelvis showed benign abd cavity, but multiple lucencies in spine concerning for possible multiple myeloma - at the same time, was noted to have mild elevation in Cr and hypokalemia - creatinine now back to baseline - denies unintentional weight loss, night sweats - continues to have diffuse back pain that is unchanged for many years - No weakness or numbness in lower extremities, no incontinence - SPEP and UPEP without M Spike, but does have elevated kappa light chains, MRI without bone lesions  Review of Systems:  Per HPI.   Social History: former smoker  Objective:  BP 102/78   Pulse 95   Temp 99.3 F (37.4 C) (Oral)   Ht '5\' 8"'$  (1.727 m)   Wt 227 lb (103 kg)   SpO2 99%   BMI 34.52 kg/m   Gen:  56 y.o. female in NAD HEENT: NCAT, MMM, anicteric sclerae Neck: Supple, no LAD CV: RRR, no MRG Resp: Non-labored, CTAB, no wheezes noted Ext: WWP, no edema MSK: Diffuse back TTP, gait is slow Neuro: Alert and oriented, speech normal       Chemistry      Component Value Date/Time   NA 144 08/30/2016 1539   K 3.7 08/30/2016 1539   CL 107 (H) 08/30/2016 1539   CO2 24 08/30/2016 1539   BUN 15 08/30/2016 1539   CREATININE 1.17 (H) 08/30/2016 1539   CREATININE 1.14 (H) 10/31/2015 1045      Component Value Date/Time   CALCIUM 8.6 (L) 08/30/2016 1539   ALKPHOS 53 08/30/2016 1539   AST 20 08/30/2016 1539   ALT 18 08/30/2016 1539   BILITOT 0.3 08/30/2016 1539      Lab Results  Component Value Date   WBC 9.0 08/30/2016   HGB 13.2 08/30/2016   HCT 41.9 08/30/2016   MCV 92 08/30/2016   PLT 204 08/30/2016   Lab Results  Component Value Date   TSH 1.01 11/05/2011   Lab Results  Component Value Date   HGBA1C 6.3 07/01/2016   Assessment & Plan:     Shannon Stuart is a 56 y.o. 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[place]) Patient without M spike on SPEP or UPEP, but does have elevation of free Light chains MRI shows no bone lesions, but CT was suspicious for multiple myeloma Not entirely clear picture as to whether patient has multiple myeloma or not, she could be in the 15% of patients that does not have an M spike on electrophoresis Referral to oncology for further workup and management  Bone lesion See plan above for kappa light chain disease  Chronic low back pain Stable Suspect this is related to all the degenerative changes seen on MRI, rather than bone lesions as suspected on CT Given Toradol shot today, though extensively counseled on risks of continued NSAID use Encouraged her to continue with the exercise that she is working hard at   [employer], Dionne Bucy, MD MPH PGY-3,  [facility] 09/17/2016  2:24 PM

## 2016-09-17 NOTE — Assessment & Plan Note (Signed)
See plan above for kappa light chain disease

## 2016-09-17 NOTE — Patient Instructions (Signed)
I will refer you to Oncology.  Someone will call you about that appointment within the next 1-2 weeks.  If you do not hear about this, please give [place] a call.  Let's plan to have you follow-up with your new doctor in 2-3 months about your medical conditions.  Take care, Dr. Jacinto Reap

## 2016-09-17 NOTE — Assessment & Plan Note (Signed)
Patient without M spike on SPEP or UPEP, but does have elevation of free Light chains MRI shows no bone lesions, but CT was suspicious for multiple myeloma Not entirely clear picture as to whether patient has multiple myeloma or not, she could be in the 15% of patients that does not have an M spike on electrophoresis Referral to oncology for further workup and management

## 2016-09-19 ENCOUNTER — Ambulatory Visit: Payer: Medicare Other | Admitting: Family Medicine

## 2016-09-20 ENCOUNTER — Ambulatory Visit (HOSPITAL_BASED_OUTPATIENT_CLINIC_OR_DEPARTMENT_OTHER): Payer: Medicare Other | Admitting: Hematology and Oncology

## 2016-09-20 ENCOUNTER — Telehealth: Payer: Self-pay | Admitting: Hematology and Oncology

## 2016-09-20 ENCOUNTER — Ambulatory Visit (HOSPITAL_BASED_OUTPATIENT_CLINIC_OR_DEPARTMENT_OTHER): Payer: Medicare Other

## 2016-09-20 ENCOUNTER — Encounter: Payer: Self-pay | Admitting: Hematology and Oncology

## 2016-09-20 VITALS — BP 141/104 | HR 75 | Temp 99.4°F | Resp 18 | Ht 68.0 in | Wt 229.8 lb

## 2016-09-20 DIAGNOSIS — H539 Unspecified visual disturbance: Secondary | ICD-10-CM

## 2016-09-20 DIAGNOSIS — D8989 Other specified disorders involving the immune mechanism, not elsewhere classified: Secondary | ICD-10-CM

## 2016-09-20 DIAGNOSIS — M791 Myalgia: Secondary | ICD-10-CM | POA: Diagnosis not present

## 2016-09-20 DIAGNOSIS — M25519 Pain in unspecified shoulder: Secondary | ICD-10-CM

## 2016-09-20 DIAGNOSIS — M79609 Pain in unspecified limb: Secondary | ICD-10-CM

## 2016-09-20 DIAGNOSIS — G8929 Other chronic pain: Secondary | ICD-10-CM | POA: Diagnosis not present

## 2016-09-20 DIAGNOSIS — R1032 Left lower quadrant pain: Secondary | ICD-10-CM | POA: Diagnosis not present

## 2016-09-20 DIAGNOSIS — M7918 Myalgia, other site: Secondary | ICD-10-CM

## 2016-09-20 DIAGNOSIS — R1031 Right lower quadrant pain: Secondary | ICD-10-CM | POA: Diagnosis not present

## 2016-09-20 DIAGNOSIS — K529 Noninfective gastroenteritis and colitis, unspecified: Secondary | ICD-10-CM | POA: Diagnosis not present

## 2016-09-20 LAB — COMPREHENSIVE METABOLIC PANEL
ALT: 22 U/L (ref 0–55)
AST: 25 U/L (ref 5–34)
Albumin: 3.4 g/dL — ABNORMAL LOW (ref 3.5–5.0)
Alkaline Phosphatase: 68 U/L (ref 40–150)
Anion Gap: 7 mEq/L (ref 3–11)
BUN: 16.8 mg/dL (ref 7.0–26.0)
CO2: 30 mEq/L — ABNORMAL HIGH (ref 22–29)
Calcium: 8.7 mg/dL (ref 8.4–10.4)
Chloride: 105 mEq/L (ref 98–109)
Creatinine: 1.1 mg/dL (ref 0.6–1.1)
EGFR: 61 mL/min/{1.73_m2} — ABNORMAL LOW (ref >90)
Glucose: 97 mg/dl (ref 70–140)
Potassium: 3.5 mEq/L (ref 3.5–5.1)
Sodium: 142 mEq/L (ref 136–145)
Total Bilirubin: 0.41 mg/dL (ref 0.20–1.20)
Total Protein: 5.9 g/dL — ABNORMAL LOW (ref 6.4–8.3)

## 2016-09-20 LAB — CBC & DIFF AND RETIC
BASO%: 0.3 % (ref 0.0–2.0)
Basophils Absolute: 0 10*3/uL (ref 0.0–0.1)
EOS%: 2.4 % (ref 0.0–7.0)
Eosinophils Absolute: 0.2 10*3/uL (ref 0.0–0.5)
HCT: 38.5 % (ref 34.8–46.6)
HGB: 12.4 g/dL (ref 11.6–15.9)
Immature Retic Fract: 11.2 % — ABNORMAL HIGH (ref 1.60–10.00)
LYMPH%: 45.9 % (ref 14.0–49.7)
MCH: 29.8 pg (ref 25.1–34.0)
MCHC: 32.2 g/dL (ref 31.5–36.0)
MCV: 92.5 fL (ref 79.5–101.0)
MONO#: 0.5 10*3/uL (ref 0.1–0.9)
MONO%: 6.4 % (ref 0.0–14.0)
NEUT#: 3.5 10*3/uL (ref 1.5–6.5)
NEUT%: 45 % (ref 38.4–76.8)
Platelets: 202 10*3/uL (ref 145–400)
RBC: 4.16 10*6/uL (ref 3.70–5.45)
RDW: 15.3 % — ABNORMAL HIGH (ref 11.2–14.5)
Retic %: 1.94 % (ref 0.70–2.10)
Retic Ct Abs: 80.7 10*3/uL (ref 33.70–90.70)
WBC: 7.9 10*3/uL (ref 3.9–10.3)
lymph#: 3.6 10*3/uL — ABNORMAL HIGH (ref 0.9–3.3)
nRBC: 0 % (ref 0–0)

## 2016-09-20 LAB — LACTATE DEHYDROGENASE: LDH: 218 U/L (ref 125–245)

## 2016-09-20 NOTE — Telephone Encounter (Signed)
Scheduled appt per 6/29 los - Gave patient AVS and calender per los.  
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
## 2016-09-20 NOTE — Assessment & Plan Note (Signed)
56-year-old female referred to our clinic for evaluation of suspected multiple myeloma based on the appearance of the CT scan of the abdomen pelvis, and finding of elevated kappa light chain level in the urine sample.  Patient presents with numerous problems including extensive musculoskeletal complaints and gastrointestinal complaints. Bands, picture is rather confusing and does not mounting his specific syndrome at this point in time.  #1: MSK pain/tenderness: Suspect patient may suffer from fibromyalgia considering presence of multiple trigger points over the upper and lower back as well as the trigger points over the lateral proximal thighs. Patient also has substantial degenerative changes in the spine including cervical spinal canal stenosis of moderate degree based on the recent MRI. This together can easily lead to the symptoms that patient is exhibiting.  #2. Digestive problems: Long-term digestive issues that do not have a clear correlation with particular food types. Lactulose intolerance or celiac disease are low and my differential at this point in time based on presented history. Persistent dysfunction of the biliary or pancreatic or biliary drainage is a possibility likely affecting patient's ability to digest fatty foods and proteinaceous foods. Additional evaluation can be considered by a GI specialist if so desired  #3. Vision changes: Vision changes with bright spots appearing woman patient is orthostatic are entirely clear and may represent hypertensive retinopathy considering severely elevated blood pressures note today. I have strongly recommended patient to seek assessment by an ophthalmologist. We'll make referral today.  #4. Possible myeloma-monoclonal gammopathy: At this time, no strong evidence for a myeloma is noted on the current evaluation. The abnormal kappa light chains were present in the urine sample only and the serum free light chains have not been obtained so far. Will  obtain additional lab work, but patient does have substantial proteinuria and that needs to be addressed as this may represent a sign of hypertensive nephropathy.  Plan: --Referral to Ophthalmology --Will defer management of possible fibromyalgia and digestive problems to the primary care provider of the patient --Labs today as outlined below --RTC in one week to review the labwork and to plan further evaluation.  Voice recognition software was used and creation of this note. Despite my best effort at editing the text, some misspelling/errors may have occurred.    

## 2016-09-21 LAB — HEPATITIS C ANTIBODY: Hep C Virus Ab: <0.1 s/co ratio (ref 0.0–0.9)

## 2016-09-21 LAB — IGG, IGA, IGM
IgA, Qn, Serum: 102 mg/dL (ref 87–352)
IgG, Qn, Serum: 545 mg/dL — ABNORMAL LOW (ref 700–1600)
IgM, Qn, Serum: 30 mg/dL (ref 26–217)

## 2016-09-21 LAB — BETA 2 MICROGLOBULIN, SERUM: Beta-2: 2.2 mg/L (ref 0.6–2.4)

## 2016-09-21 LAB — SEDIMENTATION RATE: Sedimentation Rate-Westergren: 3 mm/hr (ref 0–40)

## 2016-09-21 LAB — RHEUMATOID FACTOR: RA Latex Turbid.: <10 IU/mL (ref 0.0–13.9)

## 2016-09-23 LAB — KAPPA/LAMBDA LIGHT CHAINS
Ig Kappa Free Light Chain: 20.1 mg/L — ABNORMAL HIGH (ref 3.3–19.4)
Ig Lambda Free Light Chain: 16.2 mg/L (ref 5.7–26.3)
Kappa/Lambda FluidC Ratio: 1.24 (ref 0.26–1.65)

## 2016-09-23 LAB — ANTINUCLEAR ANTIBODIES, IFA: ANA Titer 1: NEGATIVE

## 2016-09-27 ENCOUNTER — Ambulatory Visit (HOSPITAL_BASED_OUTPATIENT_CLINIC_OR_DEPARTMENT_OTHER): Payer: Medicare Other | Admitting: Hematology and Oncology

## 2016-09-27 ENCOUNTER — Encounter: Payer: Self-pay | Admitting: Hematology and Oncology

## 2016-09-27 ENCOUNTER — Telehealth: Payer: Self-pay | Admitting: Hematology and Oncology

## 2016-09-27 VITALS — BP 117/82 | HR 64 | Temp 99.3°F | Resp 18 | Ht 68.0 in | Wt 229.6 lb

## 2016-09-27 DIAGNOSIS — D8989 Other specified disorders involving the immune mechanism, not elsewhere classified: Secondary | ICD-10-CM

## 2016-09-27 DIAGNOSIS — R809 Proteinuria, unspecified: Secondary | ICD-10-CM | POA: Diagnosis not present

## 2016-09-27 NOTE — Patient Instructions (Signed)
Thank you for choosing [facility] to provide your oncology and hematology care.  To afford each patient quality time with our providers, please arrive 30 minutes before your scheduled appointment time.  If you arrive late for your appointment, you may be asked to reschedule.  We strive to give you quality time with our providers, and arriving late affects you and other patients whose appointments are after yours.   If you are a no show for multiple scheduled visits, you may be dismissed from the clinic at the providers discretion.    Again, thank you for choosing [facility], our hope is that these requests will decrease the amount of time that you wait before being seen by our physicians.  ______________________________________________________________________  Should you have questions after your visit to the [facility], please contact our office at [phone] between the hours of 8:30 and 4:30 p.m.    Voicemails left after 4:30p.m will not be returned until the following business day.    For prescription refill requests, please have your pharmacy contact us directly.  Please also try to allow 48 hours for prescription requests.    Please contact the scheduling department for questions regarding scheduling.  For scheduling of procedures such as PET scans, CT scans, MRI, Ultrasound, etc please contact central scheduling at [phone].    Resources For Cancer Patients and Caregivers:   Oncolink.org:  A wonderful resource for patients and healthcare providers for information regarding your disease, ways to tract your treatment, what to expect, etc.     American Cancer Society:  800-227-2345  Can help patients locate various types of support and financial assistance  Cancer Care: 1-800-813-HOPE (4673) Provides financial assistance, online support groups, medication/co-pay assistance.    [place] DSS:  336-641-3447 Where to apply for food  stamps, Medicaid, and utility assistance  Medicare Rights Center: 800-333-4114 Helps people with Medicare understand their rights and benefits, navigate the Medicare system, and secure the quality healthcare they deserve  SCAT: 336-333-6589 [place] [employer]'s shared-ride transportation service for eligible riders who have a disability that prevents them from riding the fixed route bus.    For additional information on assistance programs please contact our social worker:   Grier Hock/Abigail Elmore:  [phone]            

## 2016-09-27 NOTE — Telephone Encounter (Signed)
Scheduled appt per 7/6 los - Gave patient AVS and calender per los.  
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
## 2016-09-27 NOTE — Assessment & Plan Note (Addendum)
56 year old female referred to our clinic for evaluation of suspected multiple myeloma based on the appearance of the CT scan of the abdomen pelvis, and finding of elevated kappa light chain level in the urine sample. Patient presented with numerous problems including extensive musculoskeletal complaints and gastrointestinal complaints.   We have obtained additional lab work. Demonstrates slightly low IgG levels, but normal IgA and IgM. IgG kappa is mildly elevated with preserved kappa to lambda ratio. No significant renal abnormality, no hypercalcemia. The hematological profile is grossly normal with exception of hemoglobin at this demonstrating a trend to decline. At this time, no strong evidence for presence of a monoclonal Hodge occult process is found. Nevertheless, I would like to continue following the patient to assess for possible progressive condition. Do not have a clear explanation for developing hypogammaglobulinemia at this time.  Patient does have a significant level of proteinuria and this should be evaluated further and I do recommend possible referral to nephrology.  Plan: --RTC in 3 months: labs as outlined below, clinic visit  Voice recognition software was used and creation of this note. Despite my best effort at editing the text, some misspelling/errors may have occurred.

## 2016-12-20 ENCOUNTER — Other Ambulatory Visit (HOSPITAL_BASED_OUTPATIENT_CLINIC_OR_DEPARTMENT_OTHER): Payer: Medicare Other

## 2016-12-20 DIAGNOSIS — D8989 Other specified disorders involving the immune mechanism, not elsewhere classified: Secondary | ICD-10-CM

## 2016-12-20 LAB — COMPREHENSIVE METABOLIC PANEL
ALT: 42 U/L (ref 0–55)
AST: 49 U/L — ABNORMAL HIGH (ref 5–34)
Albumin: 3.3 g/dL — ABNORMAL LOW (ref 3.5–5.0)
Alkaline Phosphatase: 71 U/L (ref 40–150)
Anion Gap: 9 mEq/L (ref 3–11)
BUN: 12 mg/dL (ref 7.0–26.0)
CO2: 27 mEq/L (ref 22–29)
Calcium: 8.6 mg/dL (ref 8.4–10.4)
Chloride: 105 mEq/L (ref 98–109)
Creatinine: 1.2 mg/dL — ABNORMAL HIGH (ref 0.6–1.1)
EGFR: 58 mL/min/{1.73_m2} — ABNORMAL LOW (ref >90)
Glucose: 143 mg/dl — ABNORMAL HIGH (ref 70–140)
Potassium: 3.4 mEq/L — ABNORMAL LOW (ref 3.5–5.1)
Sodium: 141 mEq/L (ref 136–145)
Total Bilirubin: 0.54 mg/dL (ref 0.20–1.20)
Total Protein: 5.8 g/dL — ABNORMAL LOW (ref 6.4–8.3)

## 2016-12-20 LAB — CBC WITH DIFFERENTIAL/PLATELET
BASO%: 0.4 % (ref 0.0–2.0)
Basophils Absolute: 0 10*3/uL (ref 0.0–0.1)
EOS%: 2.6 % (ref 0.0–7.0)
Eosinophils Absolute: 0.2 10*3/uL (ref 0.0–0.5)
HCT: 42.3 % (ref 34.8–46.6)
HGB: 13.7 g/dL (ref 11.6–15.9)
LYMPH%: 38.4 % (ref 14.0–49.7)
MCH: 29.7 pg (ref 25.1–34.0)
MCHC: 32.4 g/dL (ref 31.5–36.0)
MCV: 91.8 fL (ref 79.5–101.0)
MONO#: 0.4 10*3/uL (ref 0.1–0.9)
MONO%: 5.5 % (ref 0.0–14.0)
NEUT#: 4.3 10*3/uL (ref 1.5–6.5)
NEUT%: 53.1 % (ref 38.4–76.8)
Platelets: 187 10*3/uL (ref 145–400)
RBC: 4.61 10*6/uL (ref 3.70–5.45)
RDW: 15.7 % — ABNORMAL HIGH (ref 11.2–14.5)
WBC: 8 10*3/uL (ref 3.9–10.3)
lymph#: 3.1 10*3/uL (ref 0.9–3.3)

## 2016-12-20 LAB — LACTATE DEHYDROGENASE: LDH: 221 U/L (ref 125–245)

## 2016-12-23 ENCOUNTER — Other Ambulatory Visit: Payer: Self-pay | Admitting: *Deleted

## 2016-12-23 DIAGNOSIS — M545 Low back pain: Principal | ICD-10-CM

## 2016-12-23 DIAGNOSIS — G8929 Other chronic pain: Secondary | ICD-10-CM

## 2016-12-23 NOTE — Telephone Encounter (Signed)
It looks like patient has not been prescribed Flexeril by [place] since 10/2015. She needs to schedule an appointment to discuss a possible refill.   Adin Hector, MD, MPH PGY-3 [facility] Pager [phone]

## 2016-12-25 LAB — MULTIPLE MYELOMA PANEL, SERUM
Albumin SerPl Elph-Mcnc: 3.1 g/dL (ref 2.9–4.4)
Albumin/Glob SerPl: 1.3 (ref 0.7–1.7)
Alpha 1: 0.3 g/dL (ref 0.0–0.4)
Alpha2 Glob SerPl Elph-Mcnc: 0.7 g/dL (ref 0.4–1.0)
B-Globulin SerPl Elph-Mcnc: 1 g/dL (ref 0.7–1.3)
Gamma Glob SerPl Elph-Mcnc: 0.6 g/dL (ref 0.4–1.8)
Globulin, Total: 2.5 g/dL (ref 2.2–3.9)
IgA, Qn, Serum: 101 mg/dL (ref 87–352)
IgG, Qn, Serum: 576 mg/dL — ABNORMAL LOW (ref 700–1600)
IgM, Qn, Serum: 30 mg/dL (ref 26–217)
Total Protein: 5.6 g/dL — ABNORMAL LOW (ref 6.0–8.5)

## 2016-12-26 LAB — UPEP/UIFE/LIGHT CHAINS/TP, 24-HR UR
% BETA, Urine: 0 %
ALBUMIN, U: 100 %
ALPHA 1 URINE: 0 %
ALPHA-2-GLOBULIN, U: 0 %
Free Kappa Lt Chains,Ur: 74.2 mg/L — ABNORMAL HIGH (ref 1.35–24.19)
Free Lambda Lt Chains,Ur: 1.76 mg/L (ref 0.24–6.66)
GAMMA GLOBULIN URINE: 0 %
Kappa/Lambda Ratio,U: 42.16 — ABNORMAL HIGH (ref 2.04–10.37)
PROTEIN,TOTAL,URINE: 7.1 mg/dL
Prot,24hr calculated: 71 mg/24 hr (ref 30–150)

## 2016-12-26 NOTE — Telephone Encounter (Signed)
LM on VM to CB regarding rx and need for appointment.

## 2016-12-27 ENCOUNTER — Telehealth: Payer: Self-pay | Admitting: Hematology and Oncology

## 2016-12-27 ENCOUNTER — Encounter: Payer: Self-pay | Admitting: Hematology and Oncology

## 2016-12-27 ENCOUNTER — Ambulatory Visit (HOSPITAL_BASED_OUTPATIENT_CLINIC_OR_DEPARTMENT_OTHER): Payer: Medicare Other | Admitting: Hematology and Oncology

## 2016-12-27 VITALS — BP 135/91 | HR 72 | Temp 99.6°F | Resp 18 | Ht 68.0 in | Wt 232.1 lb

## 2016-12-27 DIAGNOSIS — R809 Proteinuria, unspecified: Secondary | ICD-10-CM | POA: Diagnosis not present

## 2016-12-27 DIAGNOSIS — D8989 Other specified disorders involving the immune mechanism, not elsewhere classified: Secondary | ICD-10-CM

## 2016-12-27 DIAGNOSIS — R21 Rash and other nonspecific skin eruption: Secondary | ICD-10-CM

## 2016-12-27 NOTE — Telephone Encounter (Signed)
Gave avs and calendar for march and April 2019

## 2016-12-31 ENCOUNTER — Other Ambulatory Visit: Payer: Self-pay | Admitting: Internal Medicine

## 2016-12-31 DIAGNOSIS — R21 Rash and other nonspecific skin eruption: Secondary | ICD-10-CM | POA: Insufficient documentation

## 2016-12-31 NOTE — Assessment & Plan Note (Signed)
56 year old female referred to our clinic for evaluation of suspected multiple myeloma based on the appearance of the CT scan of the abdomen pelvis, and finding of elevated kappa light chain level in the urine sample. Patient presented with numerous problems including extensive musculoskeletal complaints and gastrointestinal complaints.   We have obtained additional lab work. Those demonstrates slightly low IgG levels, but normal IgA and IgM. IgG kappa was mildly elevated with preserved kappa to lambda ratio. No significant renal abnormality, no hypercalcemia. The hematological profile was grossly normal. No evidence supporting a monoclonal process found at that time. Patient feels reasonably well at this time, laboratory demonstrates essentially stable findings with persistence off increased Light chain proportion in the urinalysis, but not in the peripheral blood. In addition, patient has a progressive rash with violaceous/erythematous papules on the skin boil erosions. 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  Patient does have a significant level of proteinuria and this should be evaluated further and I do recommend possible referral to nephrology.  Plan: --Consult Dermatology: Evaluation of the skin findings as well as possible skin biopsy --Recommend nephrology referral --RTC in 6 months: labs as outlined below, clinic visit
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
## 2017-01-02 ENCOUNTER — Ambulatory Visit (INDEPENDENT_AMBULATORY_CARE_PROVIDER_SITE_OTHER): Payer: Medicare Other | Admitting: Internal Medicine

## 2017-01-02 ENCOUNTER — Encounter: Payer: Self-pay | Admitting: Internal Medicine

## 2017-01-02 VITALS — BP 142/98 | HR 59 | Temp 99.3°F | Wt 229.8 lb

## 2017-01-02 DIAGNOSIS — G8929 Other chronic pain: Secondary | ICD-10-CM | POA: Diagnosis not present

## 2017-01-02 DIAGNOSIS — I25119 Atherosclerotic heart disease of native coronary artery with unspecified angina pectoris: Secondary | ICD-10-CM | POA: Diagnosis not present

## 2017-01-02 DIAGNOSIS — L989 Disorder of the skin and subcutaneous tissue, unspecified: Secondary | ICD-10-CM | POA: Diagnosis not present

## 2017-01-02 DIAGNOSIS — M545 Low back pain, unspecified: Secondary | ICD-10-CM

## 2017-01-02 DIAGNOSIS — L609 Nail disorder, unspecified: Secondary | ICD-10-CM

## 2017-01-02 DIAGNOSIS — I10 Essential (primary) hypertension: Secondary | ICD-10-CM | POA: Diagnosis not present

## 2017-01-02 MED ORDER — KETOROLAC TROMETHAMINE 30 MG/ML IJ SOLN
30.0000 mg | Freq: Once | INTRAMUSCULAR | Status: AC
  Administered 2017-01-02: 30 mg via INTRAMUSCULAR

## 2017-01-02 MED ORDER — KETOROLAC TROMETHAMINE 30 MG/ML IJ SOLN: 30.0000 mg | Freq: Once | INTRAMUSCULAR | Status: DC

## 2017-01-02 MED ORDER — CYCLOBENZAPRINE HCL 10 MG PO TABS: ORAL_TABLET | ORAL | 2 refills | Status: DC

## 2017-01-02 NOTE — Assessment & Plan Note (Signed)
>>  ASSESSMENT AND PLAN FOR HYPERTENSION, BENIGN ESSENTIAL WRITTEN ON 01/02/2017 12:04 PM BY LANCASTER, ABIGAIL JOSEPH, MD  BP elevated today at 168/98 initially and 142/98 on repeat after sitting in exam room. Patient feels elevation is due to stress. BP has been previously well controlled on current regimen and only elevated at home per patient's report for past two weeks. Will continue current regimen with f/u in three months. Will likely increase metoprolol  at that time if indicated.  - Continue HCTZ-lisinopril  and metoprolol  - F/u in 3 months

## 2017-01-02 NOTE — Assessment & Plan Note (Signed)
Quickly increased in size over past month. Now with scaly hyperpigmented center. As patient's oncologist requested biopsy, punch biopsy performed today in office with specimen sent to pathology. Will await results and inform patient when available.

## 2017-01-02 NOTE — Patient Instructions (Addendum)
It was nice meeting you today Shannon Stuart!  Please try not to continue messing with your fingernail. It should grow out with time. If it does not get better, or if other fingernails start to look similar, please schedule another appointment, as this can be signs of a fungal infection.   I will let you know when the results of your biopsy are back.   I would like to see you back in about 3 months to make sure your blood pressure is improving. In the meantime, please continue to take your lisinopril-HCTZ and metoprolol as you have been.   If you have any questions or concerns, please feel free to call the clinic.   Be well,  Dr. Avon Gully
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
## 2017-01-02 NOTE — Addendum Note (Signed)
Addended by: Adin Hector on: 01/02/2017 03:12 PM   Modules accepted: Orders
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
## 2017-01-02 NOTE — Assessment & Plan Note (Signed)
BP elevated today at 168/98 initially and 142/98 on repeat after sitting in exam room. Patient feels elevation is due to stress. BP has been previously well controlled on current regimen and only elevated at home per patient's report for past two weeks. Will continue current regimen with f/u in three months. Will likely increase metoprolol at that time if indicated.  - Continue HCTZ-lisinopril and metoprolol - F/u in 3 months

## 2017-01-02 NOTE — Assessment & Plan Note (Signed)
Stable. Likely 2/2 degenerative changes. Well-controlled with Flexeril and ibuprofen.  - Flexeril refilled today - Toradol shot given today

## 2017-01-19 ENCOUNTER — Other Ambulatory Visit: Payer: Self-pay | Admitting: Obstetrics and Gynecology

## 2017-01-27 ENCOUNTER — Other Ambulatory Visit: Payer: Self-pay | Admitting: Obstetrics and Gynecology

## 2017-02-21 ENCOUNTER — Encounter (HOSPITAL_COMMUNITY): Payer: Self-pay | Admitting: Emergency Medicine

## 2017-02-21 ENCOUNTER — Emergency Department (HOSPITAL_COMMUNITY)
Admission: EM | Admit: 2017-02-21 | Discharge: 2017-02-21 | Disposition: A | Discharge status: Home / Self Care | Payer: Medicare Other | Attending: Emergency Medicine | Admitting: Emergency Medicine

## 2017-02-21 ENCOUNTER — Emergency Department (HOSPITAL_COMMUNITY): Payer: Medicare Other

## 2017-02-21 ENCOUNTER — Other Ambulatory Visit: Payer: Self-pay

## 2017-02-21 DIAGNOSIS — R509 Fever, unspecified: Secondary | ICD-10-CM | POA: Insufficient documentation

## 2017-02-21 DIAGNOSIS — I2510 Atherosclerotic heart disease of native coronary artery without angina pectoris: Secondary | ICD-10-CM | POA: Insufficient documentation

## 2017-02-21 DIAGNOSIS — R112 Nausea with vomiting, unspecified: Secondary | ICD-10-CM | POA: Insufficient documentation

## 2017-02-21 DIAGNOSIS — Z87891 Personal history of nicotine dependence: Secondary | ICD-10-CM | POA: Diagnosis not present

## 2017-02-21 DIAGNOSIS — Z79899 Other long term (current) drug therapy: Secondary | ICD-10-CM | POA: Insufficient documentation

## 2017-02-21 DIAGNOSIS — I10 Essential (primary) hypertension: Secondary | ICD-10-CM | POA: Insufficient documentation

## 2017-02-21 DIAGNOSIS — Z885 Allergy status to narcotic agent status: Secondary | ICD-10-CM | POA: Insufficient documentation

## 2017-02-21 DIAGNOSIS — M791 Myalgia, unspecified site: Secondary | ICD-10-CM | POA: Insufficient documentation

## 2017-02-21 DIAGNOSIS — J029 Acute pharyngitis, unspecified: Secondary | ICD-10-CM | POA: Diagnosis not present

## 2017-02-21 DIAGNOSIS — R05 Cough: Secondary | ICD-10-CM | POA: Insufficient documentation

## 2017-02-21 DIAGNOSIS — R1030 Lower abdominal pain, unspecified: Secondary | ICD-10-CM | POA: Diagnosis present

## 2017-02-21 DIAGNOSIS — R0981 Nasal congestion: Secondary | ICD-10-CM | POA: Insufficient documentation

## 2017-02-21 LAB — CBC
HCT: 44.2 % (ref 36.0–46.0)
Hemoglobin: 14.9 g/dL (ref 12.0–15.0)
MCH: 30 pg (ref 26.0–34.0)
MCHC: 33.7 g/dL (ref 30.0–36.0)
MCV: 89.1 fL (ref 78.0–100.0)
Platelets: 209 10*3/uL (ref 150–400)
RBC: 4.96 MIL/uL (ref 3.87–5.11)
RDW: 15.2 % (ref 11.5–15.5)
WBC: 11.3 10*3/uL — ABNORMAL HIGH (ref 4.0–10.5)

## 2017-02-21 LAB — I-STAT TROPONIN, ED: Troponin i, poc: 0 ng/mL (ref 0.00–0.08)

## 2017-02-21 LAB — I-STAT CG4 LACTIC ACID, ED
Lactic Acid, Venous: 5.41 mmol/L (ref 0.5–1.9)
Lactic Acid, Venous: 5.22 mmol/L (ref 0.5–1.9)
Lactic Acid, Venous: 3.87 mmol/L (ref 0.5–1.9)
Lactic Acid, Venous: 4.18 mmol/L (ref 0.5–1.9)

## 2017-02-21 LAB — COMPREHENSIVE METABOLIC PANEL
ALT: 34 U/L (ref 14–54)
AST: 44 U/L — ABNORMAL HIGH (ref 15–41)
Albumin: 3.9 g/dL (ref 3.5–5.0)
Alkaline Phosphatase: 74 U/L (ref 38–126)
Anion gap: 14 (ref 5–15)
BUN: 11 mg/dL (ref 6–20)
CO2: 23 mmol/L (ref 22–32)
Calcium: 9.1 mg/dL (ref 8.9–10.3)
Chloride: 100 mmol/L — ABNORMAL LOW (ref 101–111)
Creatinine, Ser: 1.11 mg/dL — ABNORMAL HIGH (ref 0.44–1.00)
GFR calc Af Amer: >60 mL/min (ref >60)
GFR calc non Af Amer: 54 mL/min — ABNORMAL LOW (ref >60)
Glucose, Bld: 192 mg/dL — ABNORMAL HIGH (ref 65–99)
Potassium: 3.3 mmol/L — ABNORMAL LOW (ref 3.5–5.1)
Sodium: 137 mmol/L (ref 135–145)
Total Bilirubin: 0.8 mg/dL (ref 0.3–1.2)
Total Protein: 6.7 g/dL (ref 6.5–8.1)

## 2017-02-21 LAB — URINALYSIS, ROUTINE W REFLEX MICROSCOPIC
Bilirubin Urine: NEGATIVE
Glucose, UA: NEGATIVE mg/dL
Hgb urine dipstick: NEGATIVE
Ketones, ur: 20 mg/dL — AB
Leukocytes, UA: NEGATIVE
Nitrite: NEGATIVE
Protein, ur: NEGATIVE mg/dL
Specific Gravity, Urine: 1.02 (ref 1.005–1.030)
pH: 6 (ref 5.0–8.0)

## 2017-02-21 LAB — LIPASE, BLOOD: Lipase: 26 U/L (ref 11–51)

## 2017-02-21 MED ORDER — HYDROMORPHONE HCL 1 MG/ML IJ SOLN
1.0000 mg | Freq: Once | INTRAMUSCULAR | Status: AC
  Administered 2017-02-21: 1 mg via INTRAVENOUS
  Filled 2017-02-21: qty 1

## 2017-02-21 MED ORDER — PROMETHAZINE HCL 25 MG/ML IJ SOLN
12.5000 mg | Freq: Once | INTRAMUSCULAR | Status: AC
  Administered 2017-02-21: 12.5 mg via INTRAVENOUS
  Filled 2017-02-21: qty 1

## 2017-02-21 MED ORDER — POTASSIUM CHLORIDE 10 MEQ/100ML IV SOLN
10.0000 meq | Freq: Once | INTRAVENOUS | Status: AC
  Administered 2017-02-21: 10 meq via INTRAVENOUS
  Filled 2017-02-21: qty 100

## 2017-02-21 MED ORDER — FAMOTIDINE IN NACL 20-0.9 MG/50ML-% IV SOLN
20.0000 mg | Freq: Once | INTRAVENOUS | Status: AC
  Administered 2017-02-21: 20 mg via INTRAVENOUS
  Filled 2017-02-21: qty 50

## 2017-02-21 MED ORDER — IOPAMIDOL (ISOVUE-300) INJECTION 61%
INTRAVENOUS | Status: AC
Start: 2017-02-21 — End: 2017-02-21
  Administered 2017-02-21: 100 mL
  Filled 2017-02-21: qty 100

## 2017-02-21 MED ORDER — SODIUM CHLORIDE 0.9 % IV BOLUS (SEPSIS)
1000.0000 mL | Freq: Once | INTRAVENOUS | Status: AC
  Administered 2017-02-21: 1000 mL via INTRAVENOUS

## 2017-02-21 MED ORDER — LORAZEPAM 2 MG/ML IJ SOLN
1.0000 mg | Freq: Once | INTRAMUSCULAR | Status: AC
  Administered 2017-02-21: 1 mg via INTRAVENOUS
  Filled 2017-02-21: qty 1

## 2017-02-21 NOTE — ED Notes (Signed)
Abnormal cg-4 reported to the PA

## 2017-02-21 NOTE — ED Triage Notes (Signed)
Pt c/o vomiting, body aches, neck pain, coughing, sore throat, diarrhea-- flu like symptoms,
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
## 2017-02-21 NOTE — ED Notes (Signed)
Pt went to X-ray.   

## 2017-02-21 NOTE — ED Notes (Signed)
Pt advised we needed urine sample and she was unable to go at this time. She will advise staff when she is able to go.

## 2017-02-21 NOTE — ED Notes (Signed)
Pt returned from CT °

## 2017-02-21 NOTE — ED Triage Notes (Signed)
Pt c/o chest pain, gfripping chest -- states has taken PHENERGAN SUPPOSITORY but has had diarrhea. Has been around sister who has had the same symptoms.

## 2017-02-21 NOTE — Discharge Instructions (Signed)
Please read attached information regarding your condition. Continue your home medications as previously prescribed. Follow-up with your primary care provider for further evaluation. Return to ED for worsening or severe abdominal pain, increased vomiting, lightheadedness, loss of consciousness, shortness of breath.

## 2017-02-21 NOTE — ED Notes (Signed)
Pt at CT

## 2017-02-21 NOTE — ED Notes (Signed)
Patient given discharge instructions and verbalized understanding.  Patient stable to discharge at this time.  Patient is alert and oriented to baseline.  No distressed noted at this time.  All belongings taken with the patient at discharge.   

## 2017-02-21 NOTE — ED Notes (Signed)
Phlebotomy notified this nurse of positive Lactic acid.

## 2017-02-26 ENCOUNTER — Other Ambulatory Visit: Payer: Self-pay

## 2017-02-26 ENCOUNTER — Ambulatory Visit (INDEPENDENT_AMBULATORY_CARE_PROVIDER_SITE_OTHER): Payer: Medicare Other | Admitting: Internal Medicine

## 2017-02-26 ENCOUNTER — Encounter: Payer: Self-pay | Admitting: Internal Medicine

## 2017-02-26 VITALS — BP 104/80 | HR 70 | Temp 98.3°F | Wt 226.0 lb

## 2017-02-26 DIAGNOSIS — E860 Dehydration: Secondary | ICD-10-CM

## 2017-02-26 DIAGNOSIS — R1111 Vomiting without nausea: Secondary | ICD-10-CM | POA: Diagnosis present

## 2017-02-26 DIAGNOSIS — G4700 Insomnia, unspecified: Secondary | ICD-10-CM

## 2017-02-26 DIAGNOSIS — Z23 Encounter for immunization: Secondary | ICD-10-CM

## 2017-02-26 DIAGNOSIS — I25119 Atherosclerotic heart disease of native coronary artery with unspecified angina pectoris: Secondary | ICD-10-CM

## 2017-02-26 MED ORDER — ZOLPIDEM TARTRATE 5 MG PO TABS: 5.0000 mg | ORAL_TABLET | Freq: Every evening | ORAL | 1 refills | Status: DC | PRN

## 2017-02-26 NOTE — Patient Instructions (Signed)
It was nice seeing you again today [place]!  I am glad you are feeling better. It is important to stay hydrated, so continue to eat and drink what you can tolerate.   I will call you when we have the results of your bloodwork back, probably within the next two days.   If your symptoms return, please let us know.   If you have any questions or concerns, please feel free to call the clinic.   Be well,  Dr. Avon Gully

## 2017-02-26 NOTE — Progress Notes (Signed)
   Subjective:   Patient: Shannon Stuart       Birthdate: 01-25-1961       MRN: [id number]      HPI  Shannon Stuart is a 56 y.o. female presenting for ED f/u.   Patient seen in ED on 11/30 for vomiting and abdominal pain. Also noted to have some lab abnormalities, including elevated lactate and Cr, thought to be 2/2 dehydration. CT abd was obtained which showed no abnormalities. Patient received 3L IVF, with some improvement in lactate, though was still elevated above normal limits. Patient improved with Ativan and IV anti-emetic. He was offered hospital admission given elevated lactate, however chose to go home with close PCP f/u.  Patient reports today that her symptoms have mostly resolved. Last episode of vomiting 4 days ago. Is slowly beginning to tolerate PO, and ate a normal meal earlier today. Has primarily been eating soups and broths. Denies any additional abdominal pain. Denies fevers. Says he generally feels well now.   Smoking status reviewed. Patient is former smoker.   Review of Systems See HPI.     Objective:  Physical Exam  Constitutional: She is oriented to person, place, and time and well-developed, well-nourished, and in no distress.  HENT:  Head: Normocephalic and atraumatic.  Mouth/Throat: Oropharynx is clear and moist.  Eyes: Conjunctivae and EOM are normal. Right eye exhibits no discharge. Left eye exhibits no discharge.  Cardiovascular: Normal rate, regular rhythm and normal heart sounds.  No murmur heard. Pulmonary/Chest: Effort normal. No respiratory distress.  Abdominal: Soft. Bowel sounds are normal. She exhibits no distension. There is no tenderness. There is no rebound and no guarding.  Neurological: She is alert and oriented to person, place, and time.  Skin:  Multiple hyperpigmented macules on face (unchanged from prior evaluations)  Psychiatric: Affect and judgment normal.      Assessment & Plan:  Vomiting, abdominal pain Now resolved. As no  abnormalities noted on CT abd and symptoms resolved spontaneously, likely viral gastroenteritis. Agree that lab abnormalities were likely 2/2 dehydration. Will repeat labs today to ensure improvement. As patient is feeling much better and no abnormalities on physical exam, will not proceed with further work-up at this time. Encouraged patient to remain well-hydrated and advance diet as tolerated.   Adin Hector, MD, MPH PGY-3 [facility] Pager [phone]

## 2017-02-27 ENCOUNTER — Telehealth: Payer: Self-pay | Admitting: Internal Medicine

## 2017-02-27 NOTE — Telephone Encounter (Signed)
Called patient regarding repeat labwork. Cr was further elevated from evaluation in the ED. This is likely due to dehydration as patient still not eating a regular diet, however also noticed proteinuria and recommendation for nephrology referral when patient was seen by oncology in 12/2016. Of note, no proteinuria in ED on 11/30, however would like to see patient again next week to repeat labs to ensure Cr is improving and there is still no proteinuria. Explained this to patient, and also asked him to hold lisinopril-HCTZ until that time. Encouraged hydration in meantime. Appt scheduled for 12/13 at 1:30PM. Will repeat BMP and obtain UA at that time.  Precepted with Dr. Andria Frames.   Adin Hector, MD, MPH PGY-3 [facility] Pager [phone]

## 2017-03-01 LAB — CBC
Hematocrit: 43.5 % (ref 34.0–46.6)
Hemoglobin: 14.5 g/dL (ref 11.1–15.9)
MCH: 29.8 pg (ref 26.6–33.0)
MCHC: 33.3 g/dL (ref 31.5–35.7)
MCV: 89 fL (ref 79–97)
Platelets: 225 10*3/uL (ref 150–379)
RBC: 4.87 x10E6/uL (ref 3.77–5.28)
RDW: 15.5 % — ABNORMAL HIGH (ref 12.3–15.4)
WBC: 8.4 10*3/uL (ref 3.4–10.8)

## 2017-03-01 LAB — BASIC METABOLIC PANEL
BUN/Creatinine Ratio: 13 (ref 9–23)
BUN: 18 mg/dL (ref 6–24)
CO2: 30 mmol/L — ABNORMAL HIGH (ref 20–29)
Calcium: 8.4 mg/dL — ABNORMAL LOW (ref 8.7–10.2)
Chloride: 100 mmol/L (ref 96–106)
Creatinine, Ser: 1.35 mg/dL — ABNORMAL HIGH (ref 0.57–1.00)
GFR calc Af Amer: 50 mL/min/{1.73_m2} — ABNORMAL LOW (ref >59)
GFR calc non Af Amer: 43 mL/min/{1.73_m2} — ABNORMAL LOW (ref >59)
Glucose: 125 mg/dL — ABNORMAL HIGH (ref 65–99)
Potassium: 3.8 mmol/L (ref 3.5–5.2)
Sodium: 142 mmol/L (ref 134–144)

## 2017-03-01 LAB — LACTIC ACID, PLASMA: Lactate, Ven: 11.9 mg/dL (ref 4.8–25.7)

## 2017-03-06 ENCOUNTER — Encounter: Payer: Self-pay | Admitting: Internal Medicine

## 2017-03-06 ENCOUNTER — Other Ambulatory Visit: Payer: Self-pay

## 2017-03-06 ENCOUNTER — Ambulatory Visit (INDEPENDENT_AMBULATORY_CARE_PROVIDER_SITE_OTHER): Payer: Medicare Other | Admitting: Internal Medicine

## 2017-03-06 VITALS — BP 142/86 | HR 82 | Temp 98.3°F | Ht 68.0 in | Wt 236.0 lb

## 2017-03-06 DIAGNOSIS — R809 Proteinuria, unspecified: Secondary | ICD-10-CM | POA: Diagnosis not present

## 2017-03-06 DIAGNOSIS — I25119 Atherosclerotic heart disease of native coronary artery with unspecified angina pectoris: Secondary | ICD-10-CM

## 2017-03-06 DIAGNOSIS — R7989 Other specified abnormal findings of blood chemistry: Secondary | ICD-10-CM | POA: Diagnosis present

## 2017-03-06 DIAGNOSIS — R601 Generalized edema: Secondary | ICD-10-CM

## 2017-03-06 LAB — POCT UA - MICROSCOPIC ONLY: Epithelial cells, urine per micros: >20

## 2017-03-06 LAB — POCT URINALYSIS DIP (MANUAL ENTRY)
Bilirubin, UA: NEGATIVE
Blood, UA: NEGATIVE
Glucose, UA: NEGATIVE mg/dL
Ketones, POC UA: NEGATIVE mg/dL
Nitrite, UA: NEGATIVE
Protein Ur, POC: NEGATIVE mg/dL
Spec Grav, UA: 1.015 (ref 1.010–1.025)
Urobilinogen, UA: 1 E.U./dL
pH, UA: 7 (ref 5.0–8.0)

## 2017-03-06 NOTE — Assessment & Plan Note (Signed)
Baseline ~1. Elevated to 1.11 in ED on 11/30, further elevated to 1.35 at [facility] on 12/05. Possibly 2/2 dehydration after vomiting. Returned today to recheck Cr. No proteinuria on UA today, which is reassuring. Patient now with bilateral 1-2+ LE edema which is new. Also with 10 pound weight gain since last week. Fluid overload is likely multifactorial. Patient was instructed to discontinue HCTZ last week, however has still been urinating regularly. Also would not expect patient with normal kidney function to gain 10 pounds in one week from holding 25mg  HCTZ. Patient may not have actually gained 10 pounds, as she could have been still been dehydrated after vomiting and weighed less than her baseline, however certainly does have fluid retention as evidenced by her LE edema. No SOB or orthopnea, and lungs CTAB, which makes cardiac etiology less likely. Patient also with no cardiac history and otherwise fairly healthy. Renal etiology still leading differential. Will repeat Cr today, and obtain BNP to rule out cardiac issues. If Cr normal, patient will resume HCTZ then f/u in one week to monitor weight. If Cr remains abnormal, will refer to nephro. If BNP elevated, will obtain echo and refer to cardiology. Will call patient likely tomorrow when results available. Patient agreeable to this plan and understands reasons to return to care.

## 2017-03-06 NOTE — Patient Instructions (Addendum)
It was nice seeing you again today Shannon Stuart!  I will call you hopefully early tomorrow morning as soon as I get the results back from your bloodwork. We will decide from there what the next steps are.  If you develop difficulty breathing or shortness of breath, or notice that the swelling is significantly worsening, please call to let us know.   If you have any questions or concerns, please feel free to call the clinic.   Be well,  Dr. Avon Gully

## 2017-03-06 NOTE — Progress Notes (Signed)
poct

## 2017-03-06 NOTE — Progress Notes (Signed)
Subjective:   Patient: Shannon Stuart       Birthdate: 19-Dec-1960       MRN: [id number]      HPI  Shannon Stuart is a 56 y.o. female presenting for f/u of proteinuria and elevated Cr.   Elevated Cr, proteinuria Patient last seen at [facility] on 12/05 for ED f/u for dehydration. Patient presented to ED on 11/30 with vomiting; was given IVF and discharged. At last visit, patient's symptoms had significantly improved, however he was still not eating normally. Repeat labwork was drawn as Cr was noted to be elevated in ED. Cr again was elevated, and actually was even more elevated than in ED. Patient was encouraged to push fluids and hold lisinopril-HCTZ in preparation for appt today.  Today, patient reports significant fluid retention since last week. He did discontinue lisinopril-HCTZ as recommended. Has also been holding his metoprolol. Has noticed that his clothes have felt tighter, and has also noticed indentations in his legs from his socks. Says he even has some pain primarily in his hips which he attributes to swelling. Appetite has still not completely returned to normal, however he is drinking significantly more than he typically does as encouraged. He is urinating about 5 times per day, which he feels is not enough given the amount he is drinking. He is concerned because he is urinating very frequently at night which is a new issue. Denies dark urine, hematuria, dysuria. Denies difficulty breathing or orthopnea.  He reports his baseline weight is between 200-210, but has noticed that he has been gaining weight over the past year or so. Says that he has also noticed that his weight fluctuates significantly throughout the day at times (ex. Will weight himself at one place, then later the same day at another place, and notice weight change of 5-10lbs).   Smoking status reviewed. Patient is former smoker.   Review of Systems See HPI.     Objective:  Physical Exam  Constitutional: She is oriented to  person, place, and time and well-developed, well-nourished, and in no distress.  HENT:  Head: Normocephalic and atraumatic.  MMM  Cardiovascular: Normal rate, regular rhythm and normal heart sounds.  No murmur heard. Pulmonary/Chest: Effort normal and breath sounds normal. No respiratory distress. She has no wheezes. She has no rales.  Musculoskeletal:  1-2+ pitting edema to knee bilateral LE  Neurological: She is alert and oriented to person, place, and time.  Skin: Skin is warm and dry.  Hyperpigmented macules on face unchanged from prior appts  Psychiatric: Affect and judgment normal.      Assessment & Plan:  Elevated serum creatinine Baseline ~1. Elevated to 1.11 in ED on 11/30, further elevated to 1.35 at [facility] on 12/05. Possibly 2/2 dehydration after vomiting. Returned today to recheck Cr. No proteinuria on UA today, which is reassuring. Patient now with bilateral 1-2+ LE edema which is new. Also with 10 pound weight gain since last week. Fluid overload is likely multifactorial. Patient was instructed to discontinue HCTZ last week, however has still been urinating regularly. Also would not expect patient with normal kidney function to gain 10 pounds in one week from holding 25mg  HCTZ. Patient may not have actually gained 10 pounds, as she could have been still been dehydrated after vomiting and weighed less than her baseline, however certainly does have fluid retention as evidenced by her LE edema. No SOB or orthopnea, and lungs CTAB, which makes cardiac etiology less likely. Patient also with no  cardiac history and otherwise fairly healthy. Renal etiology still leading differential. Will repeat Cr today, and obtain BNP to rule out cardiac issues. If Cr normal, patient will resume HCTZ then f/u in one week to monitor weight. If Cr remains abnormal, will refer to nephro. If BNP elevated, will obtain echo and refer to cardiology. Will call patient likely tomorrow when results available. Patient  agreeable to this plan and understands reasons to return to care.  Precepted with Dr. Andria Frames.   Adin Hector, MD, MPH PGY-3 [facility] Pager [phone]

## 2017-03-07 LAB — BRAIN NATRIURETIC PEPTIDE: BNP: 66.6 pg/mL (ref 0.0–100.0)

## 2017-03-07 LAB — CMP14+EGFR
ALT: 26 IU/L (ref 0–32)
AST: 23 IU/L (ref 0–40)
Albumin/Globulin Ratio: 1.9 (ref 1.2–2.2)
Albumin: 3.4 g/dL — ABNORMAL LOW (ref 3.5–5.5)
Alkaline Phosphatase: 75 IU/L (ref 39–117)
BUN/Creatinine Ratio: 18 (ref 9–23)
BUN: 18 mg/dL (ref 6–24)
Bilirubin Total: <0.2 mg/dL (ref 0.0–1.2)
CO2: 31 mmol/L — ABNORMAL HIGH (ref 20–29)
Calcium: 8.6 mg/dL — ABNORMAL LOW (ref 8.7–10.2)
Chloride: 103 mmol/L (ref 96–106)
Creatinine, Ser: 1.02 mg/dL — ABNORMAL HIGH (ref 0.57–1.00)
GFR calc Af Amer: 70 mL/min/{1.73_m2} (ref >59)
GFR calc non Af Amer: 61 mL/min/{1.73_m2} (ref >59)
Globulin, Total: 1.8 g/dL (ref 1.5–4.5)
Glucose: 106 mg/dL — ABNORMAL HIGH (ref 65–99)
Potassium: 4 mmol/L (ref 3.5–5.2)
Sodium: 144 mmol/L (ref 134–144)
Total Protein: 5.2 g/dL — ABNORMAL LOW (ref 6.0–8.5)

## 2017-03-07 NOTE — Progress Notes (Signed)
Called to inform patient of improvement in Cr from 1.35 to 1.0. Instructed patient to resume lisinopril-HCTZ and f/u next week to ensure fluid retention has improved. Scheduled appt for patient on 12/20 at 1:30 PM.   Adin Hector, MD, MPH PGY-3 [place] [facility] Pager [phone]

## 2017-03-13 ENCOUNTER — Ambulatory Visit (INDEPENDENT_AMBULATORY_CARE_PROVIDER_SITE_OTHER): Payer: Medicare Other | Admitting: Internal Medicine

## 2017-03-13 ENCOUNTER — Encounter: Payer: Self-pay | Admitting: Internal Medicine

## 2017-03-13 ENCOUNTER — Other Ambulatory Visit: Payer: Self-pay

## 2017-03-13 VITALS — BP 148/98 | HR 70 | Temp 99.2°F | Ht 68.0 in | Wt 234.2 lb

## 2017-03-13 DIAGNOSIS — R600 Localized edema: Secondary | ICD-10-CM | POA: Diagnosis present

## 2017-03-13 DIAGNOSIS — L989 Disorder of the skin and subcutaneous tissue, unspecified: Secondary | ICD-10-CM | POA: Diagnosis not present

## 2017-03-13 DIAGNOSIS — I25119 Atherosclerotic heart disease of native coronary artery with unspecified angina pectoris: Secondary | ICD-10-CM

## 2017-03-13 MED ORDER — LISINOPRIL 20 MG PO TABS: 20.0000 mg | ORAL_TABLET | Freq: Every day | ORAL | 1 refills | Status: DC

## 2017-03-13 MED ORDER — FUROSEMIDE 20 MG PO TABS
20.0000 mg | ORAL_TABLET | Freq: Every day | ORAL | 1 refills | Status: DC
Start: 2017-03-13 — End: 2017-04-09

## 2017-03-13 NOTE — Assessment & Plan Note (Signed)
Now with new lesions that are different in appearance. 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 As new lesions forming, will refer to dermatology.  - Referral to derm placed

## 2017-03-13 NOTE — Assessment & Plan Note (Signed)
>>  ASSESSMENT AND PLAN FOR LOWER EXTREMITY EDEMA WRITTEN ON 03/13/2017  4:21 PM BY LANCASTER, ABIGAIL JOSEPH, MD  Improving, though still present. With weight loss of about 2 pounds in the past week since resuming HCTZ. Patient very concerned about this. Labwork NL last week. Cr has now stabilized, making renal dysfunction less likely. BNP WNL, however sudden onset of edema concerning for possible cardiac etiology. As such, will obtain echo. Feel that venous insufficiency is less likely given sudden onset as well as lack of skin changes. Also discussed conservative treatment measures such as compression stockings and elevation.  - F/u in two weeks - Echo ordered today - Begin Lasix  20mg  qd - Stop lisinopril -HCTZ - Begin lisinopril  20mg

## 2017-03-13 NOTE — Progress Notes (Signed)
   Subjective:   Patient: Shannon Stuart       Birthdate: 10-Sep-1960       MRN: [id number]      HPI  Shannon Stuart is a 56 y.o. female presenting for f/u of weight gain. Also to discuss skin lesions.   Weight gain Patient last seen on 12/13 as follow-up for elevated Cr and proteinuria. Both issues had resolved at that visit, however patient was noted to have new LE edema bilaterally and 10 pound weight gain since the week prior. Lisinopril-HCTZ had been held while patient had elevated Cr, so this was restarted last week to help with fluid retention. BNP was obtained to rule out possible cardiac etiology, which was negative.  Patient returns today for f/u on weight after resuming lisinopril-HCTZ. Patient reports some improvement in edema but not much. Says that she has not been urinating more since resuming HCTZ. Is interested in trying Lasix.    Skin lesions Seen for this issue in 12/2016 at the request of her oncologist. One particular lesion had grown in size very quickly. Punch biopsy was performed, which showed findings possibly consistent with connective tissue disorder. Patient says since that time, she has noticed more lesions, primarily on her arms. Some lesions are different in appearance. Typical lesions were hyperpigmented macules. New lesions are erythematous papules that scab over after a while. Denies pain or bleeding of any lesions. Has not put any medication or lotions on any of the affected areas.   Smoking status reviewed. Patient is former smoker.   Review of Systems See HPI.     Objective:  Physical Exam  Constitutional: She is oriented to person, place, and time and well-developed, well-nourished, and in no distress.  HENT:  Head: Normocephalic and atraumatic.  Cardiovascular: Normal rate, regular rhythm and normal heart sounds.  No murmur heard. Pulmonary/Chest: Effort normal and breath sounds normal. No respiratory distress. She has no wheezes.  Neurological: She is  alert and oriented to person, place, and time.  Skin:  Multiple hyperpigmented macules throughout body. New erythematous papules present on arms, some with scabbing, all with surrounding erythema; non-tender to palpation.   Trace to 1+ pitting edema to mid shin bilaterally  Psychiatric: Affect and judgment normal.      Assessment & Plan:  Skin lesion of right arm Now with new lesions that are different in appearance. 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 As new lesions forming, will refer to dermatology.  - Referral to derm placed  Lower extremity edema Improving, though still present. With weight loss of about 2 pounds in the past week since resuming HCTZ. Patient very concerned about this. Labwork NL last week. Cr has now stabilized, making renal dysfunction less likely. BNP WNL, however sudden onset of edema concerning for possible cardiac etiology. As such, will obtain echo. Feel that venous insufficiency is less likely given sudden onset as well as lack of skin changes. Also discussed conservative treatment measures such as compression stockings and elevation.  - F/u in two weeks - Echo ordered today - Begin Lasix 20mg  qd - Stop lisinopril-HCTZ - Begin lisinopril 20mg    Precepted with Dr. Erin Hearing.   Adin Hector, MD, MPH PGY-3 [place] [facility] Pager [phone]

## 2017-03-13 NOTE — Assessment & Plan Note (Signed)
Improving, though still present. With weight loss of about 2 pounds in the past week since resuming HCTZ. Patient very concerned about this. Labwork NL last week. Cr has now stabilized, making renal dysfunction less likely. BNP WNL, however sudden onset of edema concerning for possible cardiac etiology. As such, will obtain echo. Feel that venous insufficiency is less likely given sudden onset as well as lack of skin changes. Also discussed conservative treatment measures such as compression stockings and elevation.  - F/u in two weeks - Echo ordered today - Begin Lasix 20mg  qd - Stop lisinopril-HCTZ - Begin lisinopril 20mg 

## 2017-03-13 NOTE — Patient Instructions (Addendum)
It was nice seeing you again today [place]!  Please STOP taking lisinopril-HCTZ. Instead, START taking lisinopril 20mg  and furosemide (Lasix) 20mg  daily. If you find that you are not urinating more frequently, please call me, and we can increase the dose.   It may also be helpful to begin wearing compression stockings, especially while you are on your feet during the day, and elevating your feet whenever possible.   You will be called with the date and time of your echocardiogram (ultrasound of your heart). You will also be called with the date and time of your dermatology appointment.   If you develop difficulty breathing, chest pain, or find that the swelling is worsening, please call to let us know or go to the emergency room.   I will see you back in about 2 weeks to see how you are doing.   If you have any questions or concerns, please feel free to call the clinic.   Be well,  Dr. Avon Gully

## 2017-03-26 ENCOUNTER — Other Ambulatory Visit: Payer: Self-pay | Admitting: Internal Medicine

## 2017-03-26 MED ORDER — IBUPROFEN 800 MG PO TABS: 800.0000 mg | ORAL_TABLET | Freq: Three times a day (TID) | ORAL | 0 refills | Status: DC | PRN

## 2017-03-27 ENCOUNTER — Ambulatory Visit (HOSPITAL_COMMUNITY)
Admission: RE | Admit: 2017-03-27 | Discharge: 2017-03-27 | Disposition: A | Discharge status: Home / Self Care | Payer: Medicare Other | Source: Ambulatory Visit | Attending: Family Medicine | Admitting: Family Medicine

## 2017-03-27 ENCOUNTER — Telehealth: Payer: Self-pay | Admitting: Internal Medicine

## 2017-03-27 DIAGNOSIS — R600 Localized edema: Secondary | ICD-10-CM | POA: Diagnosis not present

## 2017-03-27 DIAGNOSIS — Z87891 Personal history of nicotine dependence: Secondary | ICD-10-CM | POA: Insufficient documentation

## 2017-03-27 DIAGNOSIS — I10 Essential (primary) hypertension: Secondary | ICD-10-CM | POA: Insufficient documentation

## 2017-03-27 DIAGNOSIS — I2510 Atherosclerotic heart disease of native coronary artery without angina pectoris: Secondary | ICD-10-CM | POA: Insufficient documentation

## 2017-03-27 DIAGNOSIS — I071 Rheumatic tricuspid insufficiency: Secondary | ICD-10-CM | POA: Diagnosis not present

## 2017-03-27 DIAGNOSIS — E785 Hyperlipidemia, unspecified: Secondary | ICD-10-CM | POA: Insufficient documentation

## 2017-03-27 NOTE — Progress Notes (Signed)
  Echocardiogram 2D Echocardiogram has been performed.  Jennette Dubin 03/27/2017, 11:57 AM

## 2017-03-27 NOTE — Telephone Encounter (Signed)
Called to inform patient of normal echo results. No answer. Left voicemail.   Adin Hector, MD, MPH PGY-3 [place] [facility] Pager [phone]

## 2017-04-01 ENCOUNTER — Other Ambulatory Visit: Payer: Self-pay | Admitting: Internal Medicine

## 2017-04-01 DIAGNOSIS — Z1231 Encounter for screening mammogram for malignant neoplasm of breast: Secondary | ICD-10-CM

## 2017-04-09 ENCOUNTER — Other Ambulatory Visit: Payer: Self-pay | Admitting: *Deleted

## 2017-04-09 MED ORDER — FUROSEMIDE 20 MG PO TABS: 20.0000 mg | ORAL_TABLET | Freq: Every day | ORAL | 1 refills | Status: DC

## 2017-04-18 ENCOUNTER — Other Ambulatory Visit: Payer: Self-pay | Admitting: *Deleted

## 2017-04-18 MED ORDER — METOPROLOL TARTRATE 25 MG PO TABS: 25.0000 mg | ORAL_TABLET | Freq: Two times a day (BID) | ORAL | 2 refills | Status: DC

## 2017-04-22 ENCOUNTER — Ambulatory Visit
Admission: RE | Admit: 2017-04-22 | Discharge: 2017-04-22 | Disposition: A | Discharge status: Home / Self Care | Payer: Medicare Other | Source: Ambulatory Visit | Attending: *Deleted | Admitting: *Deleted

## 2017-04-22 DIAGNOSIS — Z1231 Encounter for screening mammogram for malignant neoplasm of breast: Secondary | ICD-10-CM

## 2017-04-23 ENCOUNTER — Other Ambulatory Visit: Payer: Self-pay | Admitting: *Deleted

## 2017-04-23 ENCOUNTER — Other Ambulatory Visit: Payer: Self-pay | Admitting: Internal Medicine

## 2017-04-23 DIAGNOSIS — R928 Other abnormal and inconclusive findings on diagnostic imaging of breast: Secondary | ICD-10-CM

## 2017-04-23 MED ORDER — IBUPROFEN 800 MG PO TABS: 800.0000 mg | ORAL_TABLET | Freq: Three times a day (TID) | ORAL | 0 refills | Status: DC | PRN

## 2017-04-25 ENCOUNTER — Other Ambulatory Visit: Payer: Self-pay

## 2017-04-28 MED ORDER — IBUPROFEN 800 MG PO TABS: 800.0000 mg | ORAL_TABLET | Freq: Three times a day (TID) | ORAL | 0 refills | Status: DC | PRN

## 2017-05-05 ENCOUNTER — Ambulatory Visit
Admission: RE | Admit: 2017-05-05 | Discharge: 2017-05-05 | Disposition: A | Discharge status: Home / Self Care | Payer: Medicare Other | Source: Ambulatory Visit | Attending: Family Medicine | Admitting: Family Medicine

## 2017-05-05 DIAGNOSIS — R928 Other abnormal and inconclusive findings on diagnostic imaging of breast: Secondary | ICD-10-CM

## 2017-06-11 ENCOUNTER — Telehealth: Payer: Self-pay

## 2017-06-11 NOTE — Telephone Encounter (Signed)
Patient left message on nurse line requesting PCP to call her regarding some questions about her medications. Danley Danker, RN [facility] RN)

## 2017-06-12 NOTE — Telephone Encounter (Signed)
Returned patient's call. No answer. Left message explaining that I will be in office this AM, but if he is unable to call this AM, I will call him back tomorrow AM to discuss his medication questions. Will call again tomorrow AM if I do not hear from him by the end of the morning.   Adin Hector, MD, MPH PGY-3 [place] [facility] Pager [phone]

## 2017-06-13 ENCOUNTER — Telehealth: Payer: Self-pay | Admitting: Internal Medicine

## 2017-06-13 NOTE — Telephone Encounter (Signed)
Called patient regarding medication question. Patient said that he has an appt scheduled for this coming Tuesday, however he wanted to let me know that he wants to discuss pain and anxiety medications that he was previously prescribed, specifically Klonopin and Percocet. Was switched from Klonopin to Xanax and thinks this may not be working for him. Also is not prescribed any narcotics now and says he is in pain. Has recently been notified that he needs to move ASAP which has been a great source of stress recently. Told patient that I would be happy to discuss this with him Tuesday, and will review the notes from when he was prescribed these medications. Also discussed that his former medication regimen may not be the best regimen for him, however I will go over the documentation and we can decide together on Tuesday what would be a good plan for him. Patient amenable to this and appreciative.   Adin Hector, MD, MPH PGY-3 [place] [facility] Pager [phone]

## 2017-06-16 ENCOUNTER — Telehealth: Payer: Self-pay | Admitting: Hematology and Oncology

## 2017-06-16 NOTE — Telephone Encounter (Signed)
CME - moved 4/5 f/u to 4/8. Left message for patient and mailed schedule. Other appointments remain the same.

## 2017-06-17 ENCOUNTER — Ambulatory Visit (INDEPENDENT_AMBULATORY_CARE_PROVIDER_SITE_OTHER): Payer: Medicare Other | Admitting: Internal Medicine

## 2017-06-17 ENCOUNTER — Encounter: Payer: Self-pay | Admitting: Internal Medicine

## 2017-06-17 VITALS — BP 140/80 | HR 85 | Temp 98.5°F | Wt 221.2 lb

## 2017-06-17 DIAGNOSIS — F411 Generalized anxiety disorder: Secondary | ICD-10-CM

## 2017-06-17 DIAGNOSIS — M545 Low back pain, unspecified: Secondary | ICD-10-CM

## 2017-06-17 DIAGNOSIS — G8929 Other chronic pain: Secondary | ICD-10-CM | POA: Diagnosis not present

## 2017-06-17 MED ORDER — VENLAFAXINE HCL ER 75 MG PO CP24: 75.0000 mg | ORAL_CAPSULE | Freq: Every day | ORAL | 0 refills | Status: DC

## 2017-06-17 MED ORDER — FUROSEMIDE 20 MG PO TABS: 20.0000 mg | ORAL_TABLET | Freq: Every day | ORAL | 3 refills | Status: DC

## 2017-06-17 MED ORDER — TRAMADOL HCL 50 MG PO TABS: 50.0000 mg | ORAL_TABLET | Freq: Two times a day (BID) | ORAL | 0 refills | Status: DC | PRN

## 2017-06-17 MED ORDER — ALPRAZOLAM 1 MG PO TABS: 1.0000 mg | ORAL_TABLET | Freq: Three times a day (TID) | ORAL | 0 refills | Status: DC | PRN

## 2017-06-17 MED ORDER — KETOROLAC TROMETHAMINE 60 MG/2ML IM SOLN
60.0000 mg | Freq: Once | INTRAMUSCULAR | Status: AC
  Administered 2017-06-17: 60 mg via INTRAMUSCULAR

## 2017-06-17 NOTE — Progress Notes (Signed)
Subjective:   Patient: Shannon Stuart       Birthdate: 1960-07-14       MRN: [id number]      HPI  Beryle Bagsby Tesar is a 57 y.o. female presenting for chronic back pain and anxiety.   Chronic back pain Ongoing for many years. Began being seen for this issue at [facility] prior to 2014. Has been prescribed many medications for this in the past, including opioids prior to 2015. These were discontinued as patient was noted to be THC+ on UDS and thus in violation of pain contract. Since then, he has been receiving intermittent Toradol injections at clinic during pain flares, and has also been taking Flexeril and Tramadol at times. Reportedly has taken amitriptyline in the past and was not pleased with the side effects. Declines taking multiple other medications due to concerns about possible recalls or side effects, including but not limited to all TCS, all SSRIs (with fluoxetine and sertraline in particular), Cymbalta, gabapentin, Lyrica, and any medication requiring more than once daily dosing. Has declined PT in the past as he does not think it will be helpful. Has been seen by ortho before and has had two previous back surgeries. Was told by ortho that he will likely need a spinal fusion, though they could not guarantee his level of function after the procedure. As such, patient is not willing to move forward with any surgical procedures. Would like to resume narcotics today as says that pain is worsening and he is not having difficulty walking due to stiffness at times. Is not currently taking any medication for pain.   Anxiety Has history of anxiety for which he was previously taking Klonopin (discontinued reportedly because he has been taking it for "too long" after >4 years of use), and most recently Xanax. Says that at one time he was prescribed Xanax scheduled TID which controlled his symptoms well. Last Xanax refill was one year ago. Recently has not been taking any medication for anxiety. As above, is  opposed to taking multiple medications. Has been seen by psych in the past for this issue, however given opposition to so many medications, no good treatment plan was put into place. Anxiety has worsened recently as patient has had multiple new stressors in life (illness of his two sisters, moving into a new apartment, etc). Says that he constantly feels "wound up" now and has been getting very angry and irritated at people over things that previously would not have bothered him. Says that he feels at least somewhat angry all the time, and he previously thought he was a very happy person. Has history of insomnia for which he sometimes takes Ambien, and says that current anxiety symptoms are worsening his insomnia. Would like to resume Xanax TID.   Smoking status reviewed. Patient is former smoker.   Review of Systems See HPI.     Objective:  Physical Exam  Constitutional: She is oriented to person, place, and time and well-developed, well-nourished, and in no distress.  HENT:  Head: Normocephalic and atraumatic.  Pulmonary/Chest: Effort normal. No respiratory distress.  Musculoskeletal:  Able to walk, sit, and stand without difficulty  Neurological: She is alert and oriented to person, place, and time. Gait normal.  Skin:  Multiple hyperpigmented macules over exposed skin, unchanged from prior exams  Psychiatric:  Agitated at times throughout encounter when discussing concerns, though not disrespectful or inappropriate. Speaking at normal volume throughout encounter without raising voice.  Assessment & Plan:  Chronic low back pain - Toradol in office today - Resume Tramadol - Referral for PT placed  See note for further discussion of plan.   Anxiety state - Begin venlafaxine ER 75mg  qd. Can increase to 150mg  qd if still symptomatic in 2 wks.  - Will hopefully cause drowsiness to help with sleep, however discussed that may cause insomnia in some patients. If causing insomnia, can  begin taking in the morning.  - F/u in one month  See note for detailed discussion of plan.    Discussed plan for both back pain and anxiety at length with patient. Discussed that resumption of opioids and scheduled Xanax is not an option, as this is not the best treatment for his symptoms and would not be providing the best care. Patient became agitated at times, stating that he is not a drug addict, however was not disrespectful at all throughout encounter and never raised his voice. Regarding back pain, discussed that physical therapy will likely provide best long-term pain improvement, and encouraged patient to at least try PT. Patient agreeable to this, so referral placed today. Also discussed that, though he says Tramadol was not particularly helpful in the past, possibly combination of Tramadol with physical therapy may be more effective. Patient amenable to trying Tramadol again in combo with PT, so prescription given today. Also received Toradol injection in office today given current pain flare. If pain remains poorly controlled despite PT and Tramadol, would consider medication such as Cymbalta or Lyrica, however patient has historically been opposed to even trying these medications, and remained opposed today.  Regarding anxiety, discussed that, given recent worsening of symptoms, a daily medication is more appropriate for symptomatic control rather than short acting PRN meds like Xanax and Klonopin. Stressed that long-term Xanax and Klonopin is not the best treatment and is not safe for patient to continue taking, regardless of addiction potential. As patient refusing to take majority of anxiety medications, venlafaxine is one of a few options that he is willing to take. Given refusal to take meds dosed more than once daily, started with ER formulation today at 75mg . Can increase to 150mg  after 2w if still symptomatic. Discussed that can cause drowsiness in some patients, which would be desirable  as patient is having difficulty sleeping. Discussed that can also cause insomnia in some patients, and if this does occur, to please call to let me know. Would encourage patient to take in the morning if that does occur. If patient wishing to stop venlafaxine, would consult Dr. Valentina Lucks about a possible alternative. Buspar would be a good option, however TID dosing may not be acceptable to patient.  F/u in one month. Call sooner if experiencing side effects.    Adin Hector, MD, MPH PGY-3 [place] [facility] Pager [phone]

## 2017-06-17 NOTE — Assessment & Plan Note (Signed)
-   Begin venlafaxine ER 75mg  qd. Can increase to 150mg  qd if still symptomatic in 2 wks.  - Will hopefully cause drowsiness to help with sleep, however discussed that may cause insomnia in some patients. If causing insomnia, can begin taking in the morning.  - F/u in one month  See note for detailed discussion of plan.

## 2017-06-17 NOTE — Patient Instructions (Signed)
It was nice seeing you again today [place]!  Please begin taking Effexor (venlafaxine) one 75 mg tablet daily. If you are still having symptoms after 2 weeks, you can increase to two 75 mg tablets daily.   You can take Tramadol as needed for back pain. I have placed a referral to physical therapy for you. They will call you to set up your first appointment.   If you have any questions or concerns, please feel free to call the clinic.   Be well,  Dr. Avon Gully

## 2017-06-17 NOTE — Assessment & Plan Note (Signed)
-   Toradol in office today - Resume Tramadol - Referral for PT placed  See note for further discussion of plan.

## 2017-06-20 ENCOUNTER — Inpatient Hospital Stay: Payer: Medicare Other | Attending: Hematology and Oncology

## 2017-06-20 DIAGNOSIS — D8989 Other specified disorders involving the immune mechanism, not elsewhere classified: Secondary | ICD-10-CM

## 2017-06-20 LAB — CBC WITH DIFFERENTIAL/PLATELET
Basophils Absolute: 0.1 10*3/uL (ref 0.0–0.1)
Basophils Relative: 1 %
Eosinophils Absolute: 0.2 10*3/uL (ref 0.0–0.5)
Eosinophils Relative: 3 %
HCT: 41.6 % (ref 34.8–46.6)
Hemoglobin: 13.6 g/dL (ref 11.6–15.9)
Lymphocytes Relative: 40 %
Lymphs Abs: 2.5 10*3/uL (ref 0.9–3.3)
MCH: 29.6 pg (ref 25.1–34.0)
MCHC: 32.7 g/dL (ref 31.5–36.0)
MCV: 90.5 fL (ref 79.5–101.0)
Monocytes Absolute: 0.4 10*3/uL (ref 0.1–0.9)
Monocytes Relative: 7 %
Neutro Abs: 3 10*3/uL (ref 1.5–6.5)
Neutrophils Relative %: 49 %
Platelets: 182 10*3/uL (ref 145–400)
RBC: 4.59 MIL/uL (ref 3.70–5.45)
RDW: 16.5 % — ABNORMAL HIGH (ref 11.2–14.5)
WBC: 6.1 10*3/uL (ref 3.9–10.3)

## 2017-06-20 LAB — COMPREHENSIVE METABOLIC PANEL
ALT: 47 U/L (ref 0–55)
AST: 41 U/L — ABNORMAL HIGH (ref 5–34)
Albumin: 3.3 g/dL — ABNORMAL LOW (ref 3.5–5.0)
Alkaline Phosphatase: 77 U/L (ref 40–150)
Anion gap: 9 (ref 3–11)
BUN: 10 mg/dL (ref 7–26)
CO2: 25 mmol/L (ref 22–29)
Calcium: 8.4 mg/dL (ref 8.4–10.4)
Chloride: 107 mmol/L (ref 98–109)
Creatinine, Ser: 1.02 mg/dL (ref 0.60–1.10)
GFR calc Af Amer: >60 mL/min (ref >60)
GFR calc non Af Amer: 59 mL/min — ABNORMAL LOW (ref >60)
Glucose, Bld: 144 mg/dL — ABNORMAL HIGH (ref 70–140)
Potassium: 3.7 mmol/L (ref 3.5–5.1)
Sodium: 141 mmol/L (ref 136–145)
Total Bilirubin: 0.3 mg/dL (ref 0.2–1.2)
Total Protein: 5.6 g/dL — ABNORMAL LOW (ref 6.4–8.3)

## 2017-06-24 LAB — MULTIPLE MYELOMA PANEL, SERUM
Albumin SerPl Elph-Mcnc: 3.2 g/dL (ref 2.9–4.4)
Albumin/Glob SerPl: 1.5 (ref 0.7–1.7)
Alpha 1: 0.2 g/dL (ref 0.0–0.4)
Alpha2 Glob SerPl Elph-Mcnc: 0.6 g/dL (ref 0.4–1.0)
B-Globulin SerPl Elph-Mcnc: 0.9 g/dL (ref 0.7–1.3)
Gamma Glob SerPl Elph-Mcnc: 0.6 g/dL (ref 0.4–1.8)
Globulin, Total: 2.2 g/dL (ref 2.2–3.9)
IgA: 101 mg/dL (ref 87–352)
IgG (Immunoglobin G), Serum: 595 mg/dL — ABNORMAL LOW (ref 700–1600)
IgM (Immunoglobulin M), Srm: 36 mg/dL (ref 26–217)
Total Protein ELP: 5.4 g/dL — ABNORMAL LOW (ref 6.0–8.5)

## 2017-06-27 ENCOUNTER — Ambulatory Visit: Payer: Medicare Other | Admitting: Hematology and Oncology

## 2017-06-30 ENCOUNTER — Encounter: Payer: Self-pay | Admitting: Hematology and Oncology

## 2017-06-30 ENCOUNTER — Inpatient Hospital Stay: Payer: Medicare Other | Attending: Hematology and Oncology | Admitting: Hematology and Oncology

## 2017-06-30 ENCOUNTER — Inpatient Hospital Stay: Payer: Medicare Other

## 2017-06-30 ENCOUNTER — Telehealth: Payer: Self-pay

## 2017-06-30 VITALS — BP 137/82 | HR 70 | Temp 99.3°F | Resp 17 | Ht 68.0 in | Wt 227.4 lb

## 2017-06-30 DIAGNOSIS — R5382 Chronic fatigue, unspecified: Secondary | ICD-10-CM

## 2017-06-30 DIAGNOSIS — D8989 Other specified disorders involving the immune mechanism, not elsewhere classified: Secondary | ICD-10-CM | POA: Diagnosis not present

## 2017-06-30 DIAGNOSIS — Z87891 Personal history of nicotine dependence: Secondary | ICD-10-CM | POA: Diagnosis not present

## 2017-06-30 DIAGNOSIS — M545 Low back pain, unspecified: Secondary | ICD-10-CM

## 2017-06-30 DIAGNOSIS — G8929 Other chronic pain: Secondary | ICD-10-CM

## 2017-06-30 LAB — TSH: TSH: 1.034 u[IU]/mL (ref 0.308–3.960)

## 2017-06-30 NOTE — Telephone Encounter (Signed)
Printed avs and calender of upcoming appointment. Per 4/8 los 

## 2017-07-09 NOTE — Assessment & Plan Note (Signed)
57 y.o. female referred to our clinic for evaluation of suspected multiple myeloma based on the appearance of the CT scan of the abdomen pelvis, and finding of elevated kappa light chain level in the urine sample. Patient presented with numerous problems including extensive musculoskeletal complaints and gastrointestinal complaints.   We have obtained additional lab work. Those demonstrated slightly low IgG levels, but normal IgA and IgM. IgG kappa was mildly elevated with preserved kappa to lambda ratio. No significant renal abnormality, no hypercalcemia. The hematological profile was grossly normal. No evidence supporting a monoclonal process found at that time.  Following the last visit to our clinic.  Patient was referred to nephrology due to elevated levels of protein in the urine.  He does not appear that she has been assessed by nephrology either due to missed medication or due to not following up with the recommended visit.  Lab work obtained prior to this visit in the clinic demonstrates improved IgG with an elevated value and continue normal IgA and IgM.  No changes in the renal function and no progressive hypercalcemia noted.  Plan: -Obtain x-rays of the L-spine due to increased pain. -Refer to Dr Ellene Route [facility]) with home patient has a previous relationship. -Return to our clinic in 6 months for continued hematological monitoring with labs obtained 1 week prior. Plan: --Consult Dermatology: Evaluation of the skin findings as well as possible skin biopsy --Recommend nephrology referral --RTC in 6 months: labs as outlined below, clinic visit
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
## 2017-07-10 ENCOUNTER — Ambulatory Visit (INDEPENDENT_AMBULATORY_CARE_PROVIDER_SITE_OTHER): Payer: Medicare Other | Admitting: Internal Medicine

## 2017-07-10 ENCOUNTER — Encounter: Payer: Self-pay | Admitting: Internal Medicine

## 2017-07-10 DIAGNOSIS — F411 Generalized anxiety disorder: Secondary | ICD-10-CM

## 2017-07-10 DIAGNOSIS — M5441 Lumbago with sciatica, right side: Secondary | ICD-10-CM

## 2017-07-10 DIAGNOSIS — M5440 Lumbago with sciatica, unspecified side: Secondary | ICD-10-CM | POA: Diagnosis not present

## 2017-07-10 DIAGNOSIS — G8929 Other chronic pain: Secondary | ICD-10-CM

## 2017-07-10 NOTE — Progress Notes (Signed)
Subjective:   Patient: Shannon Stuart       Birthdate: 1960-06-09       MRN: [id number]      HPI  Shannon Stuart is a 57 y.o. female presenting for f/u of chronic back pain and anxiety.   Chronic back pain At last appt on 03/26, patient agreed to trying Tramadol and PT. Decided not to take Tramadol after appt, as he said that he has taken in the past and was not effective. Was contacted by PT, however says he did not have time to schedule an appt then, and has not called back. Has been referred to neurosurgery by his oncologist, and appt is on 04/29. Patient plans to attend this appt.   Anxiety At last appt, patient started on venlafaxine. Says he took this for about 10 days, and did not like the way it made him feel. Says that it caused GI upset, so he did not continue it. Sx have significantly worsened since last appt. No new stressors, though stressors present at last visit have persisted, and everything generally seems to be worsening. He feels very angry all the time, and feels as if he may "go off" on someone at any minute. Says he does not want to hurt people, but thinks that hurting someone may make him feel better. Does specific things to avoid hurting people, such as talking to his pastor, two friends of his, going outside with his dog, and drawing/coloring. Says that occasionally he feels as if someone is telling him to do things ("Push him" or "Kick him") however he doesn't actually hear voices. Feels that he does have a good support system. Denies SI. As above, endorsing fleeting violent thoughts towards others, but no true HI.   Smoking status reviewed. Patient is former smoker.   Review of Systems See HPI.     Objective:  Physical Exam  Constitutional: She is oriented to person, place, and time. She appears well-developed and well-nourished.  HENT:  Head: Normocephalic and atraumatic.  Neck: Normal range of motion.  Cardiovascular: Normal rate.  Pulmonary/Chest: Effort  normal. No respiratory distress.  Neurological: She is alert and oriented to person, place, and time.  Skin: Skin is dry.  Multiple hyperpigmented lesions consistent with prior exams  Psychiatric:  Tearful at times during encounter      Assessment & Plan:  Chronic low back pain Did not resume Tramadol. Has not scheduled appt with PT. Pain is not currently biggest concern, and patient says he will call to schedule PT appt when he has time and other life issues calm down. Will go to neurosurg appt on 04/29 as scheduled.   Anxiety state - Continue venlafaxine. See note for further details.   Spoke at length with patient about his current concerns. Patient tearful throughout majority of encounter. Did speak repeatedly about his anger and feeling of wanting to hurt others, however has a support system that he calls on to help prevent himself from doing any negative actions. Also has activities that he does alone to help when he feels angry, such as walking dog and coloring. Do not feel that he is actively homicidal. Also specifically denies SI. Sx at last appt more consistent with anxiety, however now are more consistent with depression, and possibly other psychiatric diagnosis. Did mention feeling as if people were telling him to do things ("Kick him," "push him") however denies actually hearing voices. Does have known psych history however have not discussed specific diagnoses with  patient and did not feel was appropriate to do so today. Patient does have significant life stressors currently, but symptoms are worsening despite no changes in life stressors. Only took venlafaxine for 10d, which we discussed likely is not enough time to have an effect. Patient agreeable to trying medication again, and stressed that may take up to 6w to see a difference. Also provided number for [facility], and Deborah to reach out to patient to schedule an appt. Will see patient back in one month (he does not wish to schedule appt  sooner than that), however encouraged to contact or schedule appt sooner with any concerns or needs. If feels appropriate at next visit, would inquire further about psych history and work towards referral to psych.  Precepted with Dr. Gwenlyn Saran.   Adin Hector, MD, MPH PGY-3 [facility] Pager [phone]

## 2017-07-10 NOTE — Assessment & Plan Note (Signed)
Did not resume Tramadol. Has not scheduled appt with PT. Pain is not currently biggest concern, and patient says he will call to schedule PT appt when he has time and other life issues calm down. Will go to neurosurg appt on 04/29 as scheduled.

## 2017-07-10 NOTE — Assessment & Plan Note (Signed)
-   Continue venlafaxine. See note for further details.

## 2017-07-10 NOTE — Patient Instructions (Signed)
It was nice seeing you again today Shannon Stuart!  Please start taking venlafaxine again as you were before. It may take up to 6 weeks before you see an effect of this medication. Side effects usually improve with time as your body adjusts to the medication.   [name], one of our behavioral health counselors, will reach out to you to schedule an appointment. If you do not here from her within the next couple days, you can call Dr. Gwenlyn Saran at the number listed on her card to schedule an appointment.   I will see you back in one month or sooner if you need me.   If you have any questions or concerns, please feel free to call the clinic.   Be well,  Dr. Avon Gully

## 2017-07-15 ENCOUNTER — Other Ambulatory Visit: Payer: Self-pay

## 2017-07-15 MED ORDER — VENLAFAXINE HCL ER 75 MG PO CP24: 75.0000 mg | ORAL_CAPSULE | Freq: Every day | ORAL | 0 refills | Status: DC

## 2017-07-18 ENCOUNTER — Telehealth: Payer: Self-pay | Admitting: *Deleted

## 2017-07-18 ENCOUNTER — Other Ambulatory Visit: Payer: Self-pay | Admitting: Hematology and Oncology

## 2017-07-18 NOTE — Telephone Encounter (Signed)
Voicemail received requesting "Dr. Lebron Conners write something for pain and anxiety medicine for me".  Message forwarded to [facility].  Further patient communication from collaborative nurse.

## 2017-07-24 ENCOUNTER — Ambulatory Visit: Payer: Medicare Other | Admitting: Licensed Clinical Social Worker

## 2017-07-24 DIAGNOSIS — F411 Generalized anxiety disorder: Secondary | ICD-10-CM

## 2017-07-24 NOTE — Progress Notes (Signed)
ESTIMATE TIME:40 minutes Type of Service: [place]  Interpreter:No.   Shannon Stuart ( "Shannon Stuart" ) is a 57 y.o. female referred by Dr. Avon Gully for managing symptoms of  anxiety as well as stressors.  Patient is pleasant and engaged in conversation. Reports : being angry, dry mouth, hands sweating, feeling shaky and nervious . Duration of problem: on and off for the past 15 years. Has progress since no longer taking xanax and Klonopin and she continues to encounter barriers from legal situation in 2017.  Impact: daily function has anxiety symptoms at least 3 times per day Current /Hx of mental health treatment & substance use: no hx of other mental heath diagnosis or psycho therapy; has taken xanax and Klonopin in the past; currently not taking any medications; (did not like the way Effexor made her feel)  family hx of depression; denies drinking alcohol; smokes cannabis daily;   Appearance:Neat ; Thought process: Coherent; Affect: Appropriate and Tearful at time : No plan to harm self or others  LIFE /SOCIAL :  patient lives with sister ,  Currently employed:No. looking for employment   Recent Life changes: lost apartment and living with sister. GOALS: Patient will reduce symptoms of: anxiety and depression, and increase  ability YC:XKGYJE skills and self-management skills, Increase healthy adjustment to current life circumstances. INTERVENTION:  Supportive Counseling, Reflective listening, Behavioral Therapy (Relaxed breathing), Screening Tool(s)  Administered and Psychoeducation   GAD 7 : Generalized Anxiety Score 07/24/2017  Nervous, Anxious, on Edge 3  Control/stop worrying 2  Worry too much - different things 1  Trouble relaxing 1  Restless 2  Easily annoyed or irritable 2  Afraid - awful might happen 1  Total GAD 7 Score 12  Anxiety Difficulty Somewhat difficult   Depression screen [facility] 2/9 07/24/2017 07/10/2017 03/13/2017  Decreased Interest 1 0 0  Down, Depressed,  Hopeless 1 0 0  PHQ - 2 Score 2 0 0  Altered sleeping 1 - 0  Tired, decreased energy 1 - 0  Change in appetite 2 - 0  Feeling bad or failure about yourself  1 - 0  Trouble concentrating 1 - 0  Moving slowly or fidgety/restless 0 - 0  Suicidal thoughts 0 - 0  PHQ-9 Score 8 - 0  Some recent data might be hidden   ISSUES DISCUSSED: Integrated care services, support system, previous and current coping skills, community resources ,  Allowed patient to discuss concerns in her life from 2017 that continues to create stress and anxiety.    ASSESSMENT:Patient currently experiencing symptoms of anxiety and depression.  Symptoms exacerbated by traumatic legal event in patient's life 2017, which continues to impact and present barrier in her life (housing, job etc).  Patient may benefit from, and is in agreement to receive further assessment and brief therapeutic interventions to assist with managing her symptoms.  PLAN:   1.Patient will F/U with LCSW in 1 week  2. Behavioral recommendations: relaxed breathing 3 times a day  3. Referral:none at this time   [name], LCSW Licensed Clinical Social Worker [place]   [phone] 12:08 PM

## 2017-07-28 ENCOUNTER — Other Ambulatory Visit: Payer: Self-pay | Admitting: *Deleted

## 2017-07-28 DIAGNOSIS — M545 Low back pain, unspecified: Secondary | ICD-10-CM

## 2017-07-28 DIAGNOSIS — G8929 Other chronic pain: Secondary | ICD-10-CM

## 2017-07-28 MED ORDER — CYCLOBENZAPRINE HCL 10 MG PO TABS: ORAL_TABLET | ORAL | 2 refills | Status: DC

## 2017-07-31 ENCOUNTER — Ambulatory Visit: Payer: Medicare Other | Admitting: Licensed Clinical Social Worker

## 2017-07-31 DIAGNOSIS — R4589 Other symptoms and signs involving emotional state: Secondary | ICD-10-CM

## 2017-07-31 NOTE — Progress Notes (Signed)
Type of Service: Integrated Behavioral Health 2 F/U Visit Total time:35 minutes :  Interpreter:No.    Reason for follow-up: Continue brief intervention to assist patient with managing symptoms of anxiety, as well as managing stressors . Reports somewhat difficulty in daily functions. Patient is pleasant smiling and engaged in conversation.   Appearance:Neat ; Thought process: Coherent; Affect: Appropriate: No plan to harm self or others GAD 7 : Generalized Anxiety Score 07/31/2017 07/24/2017  Nervous, Anxious, on Edge 2 3  Control/stop worrying 1 2  Worry too much - different things 2 1  Trouble relaxing 1 1  Restless 1 2  Easily annoyed or irritable 1 2  Afraid - awful might happen 1 1  Total GAD 7 Score 9 12  Anxiety Difficulty Somewhat difficult Somewhat difficult   Depression screen [facility] 2/9 07/31/2017 07/24/2017 07/10/2017  Decreased Interest 1 1 0  Down, Depressed, Hopeless 1 1 0  PHQ - 2 Score 2 2 0  Altered sleeping 1 1 -  Tired, decreased energy 1 1 -  Change in appetite 1 2 -  Feeling bad or failure about yourself  1 1 -  Trouble concentrating 1 1 -  Moving slowly or fidgety/restless 1 0 -  Suicidal thoughts 0 0 -  PHQ-9 Score 8 8 -  Difficult doing work/chores Somewhat difficult - -  Some recent data might be hidden   Patient is making progress towards goal.  States she is feeling better. This is also evident by PHQ-9/GAD score above. Says talking to LCSW allowed her to get things off of her chest.   Goals: Patient will reduce symptoms of: anxiety and stress , and increase  ability AC:ZYSAYT skills, self-management skills and stress reduction, Increase healthy adjustment to current life circumstances. Intervention: Solution-Focused Strategies and Supportive Counseling, Reflective listening, Behavioral Therapy (Relaxed breathing), Problem-solving teaching/coping strategies and Psychoeducation   Issues discussed: things patient enjoys doing  ; recent interview and job offer ; Decrease  in GAD score; how implementing relaxed breathing ; concerns of not taking xanax and KLonopin and coping skills. Assessment:Patient continues to experience mild to moderate symptoms of anxiety and depression. Symptoms exacerbated by difficulty with managing stressors and life transitions. GAD scores have decrease since last visit. Patient is pleasant and smiling and not tearful during this visit. Patient started a new job which has helped with some of her stress.  She is excited about this new opportunity. Patient may benefit from, and is in agreement to continue further assessment and brief therapeutic interventions to assist with managing her symptoms.  Plan: 1. Patient will F/U with LCSW in 1 week 2. Behavioral recommendations: continue relaxed breathing and behavior activation 3. Referral: none at this time  [name], LCSW Licensed Clinical Social Worker [place]   [phone] 9:25 AM

## 2017-08-07 ENCOUNTER — Encounter: Payer: Self-pay | Admitting: Internal Medicine

## 2017-08-07 ENCOUNTER — Ambulatory Visit (INDEPENDENT_AMBULATORY_CARE_PROVIDER_SITE_OTHER): Payer: Medicare Other | Admitting: Internal Medicine

## 2017-08-07 ENCOUNTER — Ambulatory Visit: Payer: Medicare Other | Admitting: Licensed Clinical Social Worker

## 2017-08-07 DIAGNOSIS — F418 Other specified anxiety disorders: Secondary | ICD-10-CM | POA: Diagnosis present

## 2017-08-07 DIAGNOSIS — R4589 Other symptoms and signs involving emotional state: Secondary | ICD-10-CM

## 2017-08-07 MED ORDER — VILAZODONE HCL 10 MG PO TABS: 20.0000 mg | ORAL_TABLET | Freq: Every day | ORAL | 1 refills | Status: DC

## 2017-08-07 NOTE — Progress Notes (Signed)
   Subjective:   Patient: Shannon Stuart       Birthdate: 05/12/1960       MRN: [id number]      HPI  Shannon Stuart is a 57 y.o. female presenting for anxiety f/u.   Anxiety Patient says she her mood has significantly improved since last visit. She has found meetings with [name] very helpful, and is practicing the deep breathing techniques at least three times per day. She is no longer taking venlafaxine, as it caused GI upset. As such, mood has been improving with no pharmacological treatment, however patient feels that she does need to be on something, as she still feels very anxious and has difficulty coping with her emotions. As previously mentioned, still using deep breathing techniques, and is also reading and coloring to help with her anxiety. She is not having crying episodes like she used to, though does still feel as if she needs to cry at times.  Regarding life stressors, patient has started a new job since last appt, and is very happy about this. She is now working as a Geophysicist/field seismologist for [employer] while she looks for a new career. She enjoys driving because it gives her time to think about things and "plan." She does have a new life stressor of two of her three sisters endorsing suicidal ideation. She encourages them to speak with her about this to try to dissuade them, however they do not want to discuss their feelings frequently.   Of note, when initially introduced to patient last year, patient specifically wished not to be referred to as "Miss" or "Ms" [name] (preferred to be referred to as "[name]" alone), and also preferred female pronouns. Today, and also at recent appts with [name], patient using feminine pronouns when referring to herself. Patient is attracted to women and identifies as a woman. As such, future notes will reflect patient's preference for female pronouns.   Smoking status reviewed. Patient is former smoker.   Review of Systems See HPI.     Objective:    Physical Exam  Constitutional: She is oriented to person, place, and time. She appears well-developed and well-nourished. No distress.  HENT:  Head: Normocephalic and atraumatic.  Pulmonary/Chest: Effort normal. No respiratory distress.  Neurological: She is alert and oriented to person, place, and time.  Skin:  Scattered hyperpigmented plaques on face and extremities unchanged from prior  Psychiatric: She has a normal mood and affect. Her behavior is normal.      Assessment & Plan:  Anxiety with depression Improving, however still symptomatic. Reports significant GI upset with venlafaxine and has not been taking, so will not continue this medication. Given patient's long list of medications she has already taken or is unwilling to try (SSRIs, Cymbalta, Lyrica, gabapentin, etc), discussed case with Dr. Valentina Lucks. As patient only wishing to take one pill per day, will begin trial of vilazodone (10mg  qd x7d, then increase to 20mg  qd). F/u in 6w. If some improvement though not quite therapeutic, could consider increasing to 40mg  qd. Could also consider adding Buspar in addition to vilazodone per Dr. Valentina Lucks recommendation. Encouraged patient to call if changes or worsening in sx prior to appt. Pt to continue to meet with [name] q2w.    Adin Hector, MD, MPH PGY-3 [facility] Pager [phone]

## 2017-08-07 NOTE — Progress Notes (Signed)
Type of Service: Integrated Behavioral Health 3rd F/U Visit Total time:30 minutes :  Interpreter:No.    Reason for follow-up: Continue brief intervention to assist patient with managing symptoms of anxiety and depression, as well as managing family stressors  Patient is pleasant and engaged in conversation, she is excited that things are going well for her.   Appearance:Neat ; Thought process: Coherent; Affect: Appropriate: No plan to harm self or others  Patient is making progress towards goal.  States she is feeling better. This is also evident by talking with PCP after office visit. She continues to implement breathing tech and other interventions previously discussed.  Goals: Patient will reduce symptoms of: anxiety, depression and stress , and increase  ability RR:NHAFBX skills, healthy habits, self-management skills and stress reduction, Increase healthy adjustment to current life circumstances. Intervention: Solution-Focused Strategies and Behavioral Activation, Reflective listening,  Supportive Counseling   Issues discussed: new medication  ; family stress ;  Managing daily stress; new job ; future goals Assessment:Patient continues to experience symptoms of anxiety and managing behavior related to stress.  Patient may benefit from, and is in agreement to continue further assessment and brief therapeutic interventions to assist with managing her symptoms.  Plan: 1. Patient will F/U with LCSW in 2 weeks 2. Behavioral recommendations: continue relaxed breathing and Behavioral Activation  3. Referral: none at this time  [name], LCSW Licensed Clinical Social Worker [place]   [phone] 3:52 PM

## 2017-08-07 NOTE — Patient Instructions (Addendum)
It was nice seeing you again today Shannon Stuart!  Please begin taking vilazodone (Viibrid) today. You will start by taking one 10 mg tablet daily for one week, then increase to two 10 mg tablets (20 mg total) daily after that.   If you have any questions or concerns, please feel free to call the clinic.   Be well,  Dr. Avon Gully

## 2017-08-07 NOTE — Assessment & Plan Note (Signed)
Improving, however still symptomatic. Reports significant GI upset with venlafaxine and has not been taking, so will not continue this medication. Given patient's long list of medications she has already taken or is unwilling to try (SSRIs, Cymbalta, Lyrica, gabapentin, etc), discussed case with Dr. Valentina Lucks. As patient only wishing to take one pill per day, will begin trial of vilazodone (10mg  qd x7d, then increase to 20mg  qd). F/u in 6w. If some improvement though not quite therapeutic, could consider increasing to 40mg  qd. Could also consider adding Buspar in addition to vilazodone per Dr. Valentina Lucks recommendation. Encouraged patient to call if changes or worsening in sx prior to appt. Pt to continue to meet with [name] q2w.

## 2017-08-21 ENCOUNTER — Ambulatory Visit (INDEPENDENT_AMBULATORY_CARE_PROVIDER_SITE_OTHER): Payer: Medicare Other | Admitting: Licensed Clinical Social Worker

## 2017-08-21 DIAGNOSIS — F418 Other specified anxiety disorders: Secondary | ICD-10-CM

## 2017-08-21 NOTE — Progress Notes (Signed)
Type of Service: Integrated Behavioral Health 4th F/U Visit Total time:35 minutes :  Interpreter:No.    Reason for follow-up: Continue brief intervention to assist patient with managing symptoms of anxiety and depression, as well as managing stressors . Reports  somewhat difficulty in daily functions. Patient is pleasant and engaged in conversation.  Appearance:Neat ; Thought process: Coherent; Affect: Appropriate and Tearful at times : No plan to harm self or others   Patient reports making minimal progress towards goal.  States she feels like she did when meeting with LCSW the first time. This is also evident by PHQ-9/GAD score. Patient was given Vibryd by PCP reports she did not start taking the medication.  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  She is looking for employment and having difficulty with managing her stressors.   GAD 7 : Generalized Anxiety Score 08/21/2017 07/31/2017 07/24/2017  Nervous, Anxious, on Edge 1 2 3   Control/stop worrying 1 1 2   Worry too much - different things 1 2 1   Trouble relaxing 1 1 1   Restless 1 1 2   Easily annoyed or irritable 1 1 2   Afraid - awful might happen 1 1 1   Total GAD 7 Score 7 9 12   Anxiety Difficulty Somewhat difficult Somewhat difficult Somewhat difficult   Depression screen [facility] 2/9 08/21/2017 08/07/2017 07/31/2017  Decreased Interest 1 0 1  Down, Depressed, Hopeless 1 1 1   PHQ - 2 Score 2 1 2   Altered sleeping 2 - 1  Tired, decreased energy 1 - 1  Change in appetite 2 - 1  Feeling bad or failure about yourself  1 - 1  Trouble concentrating 1 - 1  Moving slowly or fidgety/restless 1 - 1  Suicidal thoughts 0 - 0  PHQ-9 Score 10 - 8  Difficult doing work/chores Somewhat difficult - Somewhat difficult  Some recent data might be hidden   Goals: Patient will reduce symptoms of: anxiety, depression and stress , and increase  ability FH:LKTGYB skills, self-management skills and stress reduction,  Increase healthy adjustment to current life circumstances and Increase motivation to adhere to plan of care. Intervention: Motivational Interviewing, Solution-Focused Strategies and Supportive Counseling, Reflective listening, Behavioral Therapy (Relaxed breathing), [employer], Problem-solving teaching/coping strategies and Psychoeducation   Issues discussed: employment related community resources  ; barriers to taking medication ;   Interventions to manage Stressors ; demonstration of relaxed breathing ; benefits of support system, self-care action plan and education on the benefit of medication with therapy. Assessment:Patient continues to experience symptoms of anxiety and depression.  Symptoms exacerbated by financial stressors as well as being angry/ bitter.  Patient may benefit from, and is in agreement to continue further assessment and brief therapeutic interventions to assist with managing her symptoms.  Plan: LCSW will continue problem solving /coping strategies and behavioral activation during next office visit. Will adjust intervention based on progress. Patient will work on the plan below prior to next office visit.  1. Patient will F/U with LCSW in 2 weeks 2. Behavioral recommendations: continue relaxed breathing, self-care action plan,  3. Referral: ,[employer]  [name], [place] Licensed Clinical Social Worker [place]   [phone] 10:32 AM

## 2017-08-26 ENCOUNTER — Telehealth: Payer: Self-pay

## 2017-08-26 NOTE — Telephone Encounter (Signed)
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 Pt call back [phone] Wallace Cullens, RN

## 2017-08-26 NOTE — Telephone Encounter (Signed)
Called patient regarding specifics for loan deferral letter request. Patient says letter is to be faxed to the [place] at [phone], and should include the fact that she is on 100% disability. She says she is currently paying her loans, however through this program, she will not be required to pay all of her loans back, at least not at this time. Letter written and faxed to number provided.   Adin Hector, MD, MPH PGY-3 [facility] Pager [phone]

## 2017-09-03 ENCOUNTER — Ambulatory Visit: Payer: Medicare Other | Admitting: Licensed Clinical Social Worker

## 2017-09-03 DIAGNOSIS — F418 Other specified anxiety disorders: Secondary | ICD-10-CM

## 2017-09-03 NOTE — Progress Notes (Signed)
Type of Service: [place] F/U Visit Total time:30 minutes :  Interpreter:No.    Reason for follow-up: Continue brief intervention to assist patient with managing symptoms of anxiety and depression, as well as managing stressors .  Patient is pleasant and engaged in conversation .   Appearance:Neat ; Thought process: Coherent; Affect: Appropriate and Tearful at times. No plan to harm self or others Patient declined screening   Goals: Patient will reduce symptoms of: anxiety, depression and stress , and increase  ability WN:UUVOZD skills, self-management skills and stress reduction, Increase healthy adjustment to current life circumstances. Intervention: Solution-Focused Strategies and Supportive Counseling, Reflective listening.   Issues discussed: managing current stressors  ; things that have made patient angry ;  Dealing with pain in back; and concerns with family members.  Also discussed options for ongoing counseling.  Assessment:Patient was not able to implement interventions discussed during last visit.  She continues to experience symptoms of anxiety and depression as well as pain in her back.  Symptoms exacerbated by managing stressors and health concerns.  Patient may benefit from, and is in agreement to continue further assessment and brief therapeutic interventions to assist with managing her symptoms.  Plan: LCSW will continue with solution focused strategies during next office visit. Will adjust interventions based on progress.  Patient will F/U with LCSW in 2 weeks after F/U with PCP.  [name], LCSW Licensed Clinical Social Worker [place]   [phone] 10:47 AM

## 2017-09-16 ENCOUNTER — Other Ambulatory Visit: Payer: Self-pay | Admitting: Internal Medicine

## 2017-09-17 ENCOUNTER — Ambulatory Visit: Payer: Medicare Other | Admitting: Licensed Clinical Social Worker

## 2017-09-17 ENCOUNTER — Other Ambulatory Visit: Payer: Self-pay

## 2017-09-17 ENCOUNTER — Ambulatory Visit (INDEPENDENT_AMBULATORY_CARE_PROVIDER_SITE_OTHER): Payer: Medicare Other | Admitting: Internal Medicine

## 2017-09-17 ENCOUNTER — Encounter: Payer: Self-pay | Admitting: Internal Medicine

## 2017-09-17 DIAGNOSIS — G4700 Insomnia, unspecified: Secondary | ICD-10-CM

## 2017-09-17 DIAGNOSIS — F418 Other specified anxiety disorders: Secondary | ICD-10-CM | POA: Diagnosis not present

## 2017-09-17 MED ORDER — ZOLPIDEM TARTRATE 5 MG PO TABS: 5.0000 mg | ORAL_TABLET | Freq: Every evening | ORAL | 1 refills | Status: DC | PRN

## 2017-09-17 NOTE — Progress Notes (Signed)
   Subjective:   Patient: Shannon Stuart       Birthdate: [birth year]       MRN: [id number]      HPI  Shannon Stuart is a 57 y.o. female presenting for anxiety/depression f/u.   Anxiety/depression Patient says she is feeling very well these days. She says she generally is in a much better mood and is "looking forward to going and doing." She is very active in the lives of her nieces, nephews, and great nieces and nephews, and wants to continue to live to see them grow. Never started vilazodone, and does not feel that she needs to be started on any additional medications. Has been meeting with [name] recently which she says has made a huge difference. Was meeting weekly, now biweekly. She says they are "building goals and making plans" which she finds very motivating. She is still using the deep breathing techniques [name] has taught her. Her chronic back pain has also improved, as she has started receiving epidurals from Dr. Ellene Route. She will receive these every 4-6 months. Dr. Ellene Route told her she will likely need surgery, but he is trying to delay this.  Major stressor at last appt was patient's sister. One of her sisters has significantly improved and is not longer endorsing SI. She is getting out of the house more, eating better, and actually has an appt scheduled with behavioral health today. Other sister seems rather depressed, but [name] is still working on getting that sister the help she needs.   Social history: [name] shared a lot about her past at today's appointment. Her mother died two weeks before she graduated from high school. She was primarily raised by her grandmother. Her grandmother always knew she was gay, and was very supportive of this. [name] is very confident in her sexuality, and is not affected by others' judgement. She is very involved in her church. She was previously Clinical research associate and was in [place], however her mother died at that time, and she was granted a "hardship discharge" to help take  care of her three younger sisters with her grandmother. She would like to have served in [employer], but does not regret the decision to help raise her sisters. She is still very close with her three sisters. She has a 7 yo son.   Smoking status reviewed. Patient is former smoker.   Review of Systems See HPI.     Objective:  Physical Exam  Constitutional: She is oriented to person, place, and time. She appears well-developed and well-nourished. No distress.  HENT:  Head: Normocephalic and atraumatic.  Pulmonary/Chest: Effort normal. No respiratory distress.  Neurological: She is alert and oriented to person, place, and time.  Skin:  Hyperpigmented plaques unchanged from prior  Psychiatric: She has a normal mood and affect. Her behavior is normal.      Assessment & Plan:  Anxiety with depression Doing very well. Never started vilazodone and is now doing so well that medication does not seem indicated at this time. Patient also wishes to proceed without medication. Encouraged continued sessions with [name], as patient finds these very helpful. Patient wishes to f/u with new PCP Dr. Shan Levans in 2 months. She is very much looking forward to meeting Dr. Shan Levans, and I have assured her that she is in very capable, caring hands.    Adin Hector, MD, MPH PGY-3 [facility] Pager [phone]

## 2017-09-17 NOTE — Progress Notes (Signed)
Type of Service: [place] F/U Visit Total time:30 minutes :  Interpreter:No.    Reason for follow-up: Continue brief intervention to assist patient with maintaining current state with minimal symptoms of anxiety and depression, as well as managing stressors . Reports some difficulty in daily functions. Patient is pleasant and engaged in conversation.   Appearance:Neat ; Thought process: Coherent; Affect: Appropriate: No plan to harm self or others   GAD 7 : Generalized Anxiety Score 09/17/2017 08/21/2017 07/31/2017 07/24/2017  Nervous, Anxious, on Edge 1 1 2 3   Control/stop worrying 1 1 1 2   Worry too much - different things 0 1 2 1   Trouble relaxing 1 1 1 1   Restless 0 1 1 2   Easily annoyed or irritable 0 1 1 2   Afraid - awful might happen 0 1 1 1   Total GAD 7 Score 3 7 9 12   Anxiety Difficulty Somewhat difficult Somewhat difficult Somewhat difficult Somewhat difficult   Depression screen [employer] 2/9 09/17/2017 08/21/2017 08/07/2017  Decreased Interest 1 1 0  Down, Depressed, Hopeless 0 1 1  PHQ - 2 Score 1 2 1   Altered sleeping 1 2 -  Tired, decreased energy 1 1 -  Change in appetite 1 2 -  Feeling bad or failure about yourself  0 1 -  Trouble concentrating 0 1 -  Moving slowly or fidgety/restless 0 1 -  Suicidal thoughts 0 0 -  PHQ-9 Score 4 10 -  Difficult doing work/chores Somewhat difficult Somewhat difficult -  Some recent data might be hidden   Patient is making progress towards goal.  States she is  feeling better. This is also evident by PHQ-9/GAD score indicating minimal symptoms of anxiety and depression. PCP also reports she is pleased with the progress patient is making towards her goals.  Patient implemented interventions previously discussed and is overall pleased with the outcome. Goals: Patient will continue to  1. reduced symptoms of: anxiety, depression and stress ,  2. Maintain ability YB:OFBPZW skills, self-management skills and stress reduction,   3. Increase healthy adjustment to current life circumstances. 4. Look for employment and housing Intervention: Solution-Focused Strategies, Veterinary surgeon and [employer], Reflective listening, ); Supportive Counseling   Issues discussed: managing stressors  ; job process ; family stressors ; health concerns and how managing. Assessment:Patient continues to experience minimal symptoms of anxiety and depression.  Patient may benefit from, and is in agreement to continue brief therapeutic interventions to assist with managing maintaining progress with managing symptoms.  Plan: LCSW will continue solution focused interventions during next office visit. Visits will be reduced to ever 3 weeks. Will adjust interventions based on continued progress. 1. Patient will F/U with LCSW in 3 weeks 2. Behavioral recommendations: continue relaxed breathing, behavioral activation and call [employer]. 3. Referral: Employment training  with [employer]  [name], LCSW Licensed Clinical Social Worker [place]   [phone] 12:16 PM

## 2017-09-17 NOTE — Patient Instructions (Addendum)
It was nice seeing you again today [place]!  It has been such a pleasure to take care of you, and I am so glad that you are feeling so much better.   You will see Dr. Maia Breslow in two months. She is looking forward to meeting you, and I think you two will get along very well. Please continue to see [name] as frequently as you need to.   If you have any questions or concerns, please feel free to call the clinic.   Be well,  Dr. Avon Gully

## 2017-09-17 NOTE — Assessment & Plan Note (Signed)
Doing very well. Never started vilazodone and is now doing so well that medication does not seem indicated at this time. Patient also wishes to proceed without medication. Encouraged continued sessions with [name], as patient finds these very helpful. Patient wishes to f/u with new PCP Dr. Shan Levans in 2 months. She is very much looking forward to meeting Dr. Shan Levans, and I have assured her that she is in very capable, caring hands.

## 2017-10-08 ENCOUNTER — Ambulatory Visit: Payer: Medicare Other

## 2017-10-15 ENCOUNTER — Other Ambulatory Visit: Payer: Self-pay | Admitting: Internal Medicine

## 2017-11-04 ENCOUNTER — Other Ambulatory Visit: Payer: Self-pay | Admitting: Family Medicine

## 2017-11-04 ENCOUNTER — Telehealth: Payer: Self-pay

## 2017-11-04 ENCOUNTER — Telehealth: Payer: Self-pay | Admitting: Family Medicine

## 2017-11-04 MED ORDER — AMLODIPINE BESYLATE 10 MG PO TABS: 10.0000 mg | ORAL_TABLET | Freq: Every day | ORAL | 3 refills | Status: DC

## 2017-11-04 NOTE — Telephone Encounter (Signed)
Called Shannon Stuart regarding her blood pressure.  I asked her to make an appointment to be seen if she can, because I would like to change some of her current blood pressure medications rather than increasing the dose or restarting lisinopril-HCTZ.  Specifically, I think she would benefit from starting amlodipine and switching from metoprolol to carvedilol.  If she cannot get an appointment for sometime this week, I will prescribe enough of this medication to last her to whenever she can make an appointment.

## 2017-11-04 NOTE — Telephone Encounter (Signed)
Pt called and scheduled an appointment with Dr. Shan Levans to start the new blood pressure medication they discussed. Pt's appointment is 08/27 and would like to know if Dr. Shan Levans could send that medication in this week to start since there is now an appointment scheduled.

## 2017-11-04 NOTE — Telephone Encounter (Signed)
Patient called stating her BP is not well controlled on her current regimen. Takes Lisinopril 20 once daily, Metoprolol 25 mg once daily (written as twice daily but states previous PCP told her to take once daily when she changed her regimen), and Lasix 20 mg.  Was previously on Lisinopril-HCTZ and BP was better but Dr Avon Gully changed due to swelling in ankles. Would like Lisinopril increased and to go back on combo.  States BP readings are 170-180/110-115  Discussed with patient that she needs an appt but wants message sent first.  Call back is [phone]  Danley Danker, RN [facility] RN)

## 2017-11-04 NOTE — Telephone Encounter (Signed)
Amlodipine sent, patient called and notified.

## 2017-11-18 ENCOUNTER — Other Ambulatory Visit: Payer: Self-pay

## 2017-11-18 ENCOUNTER — Encounter: Payer: Self-pay | Admitting: Family Medicine

## 2017-11-18 ENCOUNTER — Ambulatory Visit (INDEPENDENT_AMBULATORY_CARE_PROVIDER_SITE_OTHER): Payer: Medicare Other | Admitting: Family Medicine

## 2017-11-18 VITALS — BP 136/88 | HR 79 | Temp 98.4°F | Ht 68.0 in | Wt 219.4 lb

## 2017-11-18 DIAGNOSIS — Z23 Encounter for immunization: Secondary | ICD-10-CM

## 2017-11-18 DIAGNOSIS — I10 Essential (primary) hypertension: Secondary | ICD-10-CM | POA: Diagnosis not present

## 2017-11-18 DIAGNOSIS — M170 Bilateral primary osteoarthritis of knee: Secondary | ICD-10-CM | POA: Diagnosis not present

## 2017-11-18 DIAGNOSIS — F418 Other specified anxiety disorders: Secondary | ICD-10-CM

## 2017-11-18 MED ORDER — ALPRAZOLAM 1 MG PO TABS: 1.0000 mg | ORAL_TABLET | Freq: Three times a day (TID) | ORAL | 0 refills | Status: DC | PRN

## 2017-11-18 MED ORDER — LISINOPRIL 40 MG PO TABS: 20.0000 mg | ORAL_TABLET | Freq: Every day | ORAL | 11 refills | Status: DC

## 2017-11-18 MED ORDER — KETOROLAC TROMETHAMINE 30 MG/ML IJ SOLN
30.0000 mg | Freq: Once | INTRAMUSCULAR | Status: AC
  Administered 2017-11-18: 30 mg via INTRAMUSCULAR
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
## 2017-11-18 NOTE — Assessment & Plan Note (Signed)
>>  ASSESSMENT AND PLAN FOR HYPERTENSION, BENIGN ESSENTIAL WRITTEN ON 11/18/2017  3:40 PM BY WINFREY, AMANDA C, MD  Continues to be hypertensive today.  We will continue amlodipine  and increase lisinopril  dose from 20 mg to 40 mg.  Patient will stop metoprolol  for now after discussing side effects of this medication.  We will start carvedilol at her next visit if her blood pressure is still high.

## 2017-11-18 NOTE — Patient Instructions (Signed)
It was nice meeting you today Shannon Stuart!  I am giving you a 30 day supply of Xanax for your anxiety and we are doubling your dose of Lisinopril today.  Please stop taking your Lasix for now.  I would like to see you back in two months to continue discussing how to best manage your anxiety.  If you have any questions or concerns, please feel free to call the clinic.   Be well,  Dr. Shan Levans

## 2017-11-18 NOTE — Assessment & Plan Note (Signed)
Continues to be hypertensive today.  We will continue amlodipine and increase lisinopril dose from 20 mg to 40 mg.  Patient will stop metoprolol for now after discussing side effects of this medication.  We will start carvedilol at her next visit if her blood pressure is still high.

## 2017-11-19 ENCOUNTER — Other Ambulatory Visit: Payer: Self-pay | Admitting: Family Medicine

## 2017-11-19 NOTE — Assessment & Plan Note (Addendum)
History and exam consistent with significant anxiety and difficulty coping with her stress and frustration, even though GAD 7 and PHQ 9 scores are not severe.  We discussed that confronting people while driving is potentially dangerous and not a healthy form of coping with her stress.  Patient was adamant that she needed Xanax to help with her anxiety.  She has been on a variety of SSRIs and other medications for anxiety and depression, but she says that none of these have worked for her.  She is very wary of all of these medications.  I expressed to her that Xanax is not a good long-term solution for her anxiety and that I was not willing to prescribe this on a long-term basis.  We discussed that I would give her a small quantity today and that we would find other solutions at her next visits.  Patient would benefit from seeing a therapist regularly.

## 2017-11-19 NOTE — Assessment & Plan Note (Signed)
Gave a Toradol shot today.  Discussed with patient that NSAIDs such as Toradol can exacerbate high blood pressure, which she has.  Patient expressed understanding and wished to proceed with Toradol shot.

## 2017-11-27 ENCOUNTER — Telehealth: Payer: Self-pay | Admitting: Family Medicine

## 2017-11-27 NOTE — Telephone Encounter (Signed)
Medical records release form (that was supposed to be completed at 8/27 visit) dropped off for at front desk for completion. Please complete all highlited areas.  Verified that patient section of form has been completed.  Last DOS/WCC with PCP was 11/18/17.  Placed form in team folder to be completed by clinical staff.  Crista Luria

## 2017-11-28 NOTE — Telephone Encounter (Signed)
Form placed in PCP box for completion. Zimmerman Rumple, Davonna Ertl D, CMA  

## 2017-11-28 NOTE — Telephone Encounter (Signed)
Form completed and placed in nurse folder

## 2017-12-01 NOTE — Telephone Encounter (Signed)
Left voicemail informing patient that form is ready for pick up at front desk. Copy made for batch scanning.  Danley Danker, RN [facility] RN)

## 2017-12-05 ENCOUNTER — Telehealth: Payer: Self-pay | Admitting: *Deleted

## 2017-12-05 ENCOUNTER — Telehealth: Payer: Self-pay | Admitting: Licensed Clinical Social Worker

## 2017-12-05 NOTE — Progress Notes (Signed)
.  Type of Service: Clinical Social Work  LCSW returned phone call from patient, states she has located housing after being homeless, however the landlord will not allow her emotional support animal to live in the new apartment without a letter from her PCP. Patient continues to experience symptoms of anxiety and depression and the thought of not being able to take the dog with her is creating more anxiety.  Patient is requesting LCSW assist with obtaining a letter from PCP.  In-basket message sent to PCP for request.  [name], LCSW Licensed Clinical Social Worker [facility]   [phone] 3:07 PM

## 2017-12-05 NOTE — Telephone Encounter (Signed)
Pt would like a letter stating that her dog is an emotional support animal.   [place], Salome Spotted, [place]

## 2017-12-08 ENCOUNTER — Encounter: Payer: Self-pay | Admitting: Family Medicine

## 2017-12-09 NOTE — Telephone Encounter (Signed)
Left VM informing patient that I wrote the letter she requested allowing her dog to be an emotional support animal, but that I said in the letter that since I've never met her dog, I cannot be held liable for his/her behavior.  The letter will be up front for her to pick up.

## 2017-12-23 ENCOUNTER — Other Ambulatory Visit: Payer: Self-pay | Admitting: *Deleted

## 2017-12-23 ENCOUNTER — Inpatient Hospital Stay: Payer: Medicare Other | Attending: Hematology

## 2017-12-23 DIAGNOSIS — M545 Low back pain, unspecified: Secondary | ICD-10-CM

## 2017-12-23 DIAGNOSIS — Z79899 Other long term (current) drug therapy: Secondary | ICD-10-CM | POA: Insufficient documentation

## 2017-12-23 DIAGNOSIS — D8989 Other specified disorders involving the immune mechanism, not elsewhere classified: Secondary | ICD-10-CM | POA: Insufficient documentation

## 2017-12-23 DIAGNOSIS — Z87891 Personal history of nicotine dependence: Secondary | ICD-10-CM | POA: Insufficient documentation

## 2017-12-23 DIAGNOSIS — G8929 Other chronic pain: Secondary | ICD-10-CM

## 2017-12-23 LAB — CMP (CANCER CENTER ONLY)
ALT: 28 U/L (ref 0–44)
AST: 26 U/L (ref 15–41)
Albumin: 3.7 g/dL (ref 3.5–5.0)
Alkaline Phosphatase: 76 U/L (ref 38–126)
Anion gap: 6 (ref 5–15)
BUN: 9 mg/dL (ref 6–20)
CO2: 28 mmol/L (ref 22–32)
Calcium: 8.7 mg/dL — ABNORMAL LOW (ref 8.9–10.3)
Chloride: 107 mmol/L (ref 98–111)
Creatinine: 1.07 mg/dL — ABNORMAL HIGH (ref 0.44–1.00)
GFR, Est AFR Am: >60 mL/min (ref >60)
GFR, Estimated: 56 mL/min — ABNORMAL LOW (ref >60)
Glucose, Bld: 143 mg/dL — ABNORMAL HIGH (ref 70–99)
Potassium: 4.1 mmol/L (ref 3.5–5.1)
Sodium: 141 mmol/L (ref 135–145)
Total Bilirubin: 0.4 mg/dL (ref 0.3–1.2)
Total Protein: 6.3 g/dL — ABNORMAL LOW (ref 6.5–8.1)

## 2017-12-23 LAB — CBC WITH DIFFERENTIAL (CANCER CENTER ONLY)
Basophils Absolute: 0.1 10*3/uL (ref 0.0–0.1)
Basophils Relative: 1 %
Eosinophils Absolute: 0.1 10*3/uL (ref 0.0–0.5)
Eosinophils Relative: 3 %
HCT: 39.4 % (ref 34.8–46.6)
Hemoglobin: 12.7 g/dL (ref 11.6–15.9)
Lymphocytes Relative: 45 %
Lymphs Abs: 2.4 10*3/uL (ref 0.9–3.3)
MCH: 29.4 pg (ref 25.1–34.0)
MCHC: 32.2 g/dL (ref 31.5–36.0)
MCV: 91.1 fL (ref 79.5–101.0)
Monocytes Absolute: 0.4 10*3/uL (ref 0.1–0.9)
Monocytes Relative: 7 %
Neutro Abs: 2.3 10*3/uL (ref 1.5–6.5)
Neutrophils Relative %: 44 %
Platelet Count: 188 10*3/uL (ref 145–400)
RBC: 4.33 MIL/uL (ref 3.70–5.45)
RDW: 16.3 % — ABNORMAL HIGH (ref 11.2–14.5)
WBC Count: 5.3 10*3/uL (ref 3.9–10.3)

## 2017-12-23 LAB — LACTATE DEHYDROGENASE: LDH: 171 U/L (ref 98–192)

## 2017-12-24 LAB — MULTIPLE MYELOMA PANEL, SERUM
Albumin SerPl Elph-Mcnc: 3.4 g/dL (ref 2.9–4.4)
Albumin/Glob SerPl: 1.5 (ref 0.7–1.7)
Alpha 1: 0.2 g/dL (ref 0.0–0.4)
Alpha2 Glob SerPl Elph-Mcnc: 0.6 g/dL (ref 0.4–1.0)
B-Globulin SerPl Elph-Mcnc: 0.9 g/dL (ref 0.7–1.3)
Gamma Glob SerPl Elph-Mcnc: 0.7 g/dL (ref 0.4–1.8)
Globulin, Total: 2.4 g/dL (ref 2.2–3.9)
IgA: 100 mg/dL (ref 87–352)
IgG (Immunoglobin G), Serum: 695 mg/dL — ABNORMAL LOW (ref 700–1600)
IgM (Immunoglobulin M), Srm: 46 mg/dL (ref 26–217)
Total Protein ELP: 5.8 g/dL — ABNORMAL LOW (ref 6.0–8.5)

## 2017-12-24 LAB — KAPPA/LAMBDA LIGHT CHAINS
Kappa free light chain: 20.4 mg/L — ABNORMAL HIGH (ref 3.3–19.4)
Kappa, lambda light chain ratio: 1.57 (ref 0.26–1.65)
Lambda free light chains: 13 mg/L (ref 5.7–26.3)

## 2017-12-24 LAB — BETA 2 MICROGLOBULIN, SERUM: Beta-2 Microglobulin: 1.5 mg/L (ref 0.6–2.4)

## 2017-12-30 ENCOUNTER — Encounter: Payer: Self-pay | Admitting: Hematology

## 2017-12-30 ENCOUNTER — Inpatient Hospital Stay (HOSPITAL_BASED_OUTPATIENT_CLINIC_OR_DEPARTMENT_OTHER): Payer: Medicare Other | Admitting: Hematology

## 2017-12-30 VITALS — BP 118/96 | HR 83 | Temp 98.3°F | Resp 18 | Ht 68.0 in | Wt 223.2 lb

## 2017-12-30 DIAGNOSIS — D8989 Other specified disorders involving the immune mechanism, not elsewhere classified: Secondary | ICD-10-CM | POA: Diagnosis not present

## 2017-12-30 DIAGNOSIS — Z87891 Personal history of nicotine dependence: Secondary | ICD-10-CM
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
## 2017-12-31 ENCOUNTER — Telehealth: Payer: Self-pay

## 2017-12-31 NOTE — Telephone Encounter (Signed)
Per 10/8 no los 

## 2018-01-06 MED ORDER — ALPRAZOLAM 1 MG PO TABS: 1.0000 mg | ORAL_TABLET | Freq: Three times a day (TID) | ORAL | 0 refills | Status: DC | PRN

## 2018-01-06 NOTE — Telephone Encounter (Signed)
I'm sending in her Xanax, but I'm reducing the amount per month from 30 to 20 in an attempt to wean her down.  She can come in to see me if she would like to discuss this, but this has been discussed with her previously as well (by me and Abby).  Thanks!

## 2018-01-08 NOTE — Telephone Encounter (Signed)
Pt informed of below. Zimmerman Rumple, April D, CMA  

## 2018-02-02 ENCOUNTER — Other Ambulatory Visit: Payer: Self-pay

## 2018-02-02 MED ORDER — PANTOPRAZOLE SODIUM 40 MG PO TBEC: 40.0000 mg | DELAYED_RELEASE_TABLET | Freq: Every day | ORAL | 3 refills | Status: DC

## 2018-02-21 ENCOUNTER — Other Ambulatory Visit: Payer: Self-pay | Admitting: Family Medicine

## 2018-02-21 DIAGNOSIS — M545 Low back pain, unspecified: Secondary | ICD-10-CM

## 2018-02-21 DIAGNOSIS — G8929 Other chronic pain: Secondary | ICD-10-CM

## 2018-02-24 ENCOUNTER — Telehealth: Payer: Self-pay

## 2018-02-24 NOTE — Telephone Encounter (Signed)
Patient states she takes Lisinopril 40 mg daily, not one-half. Needs new Rx sent.  Danley Danker, RN [facility] RN)

## 2018-02-25 ENCOUNTER — Other Ambulatory Visit: Payer: Self-pay | Admitting: Family Medicine

## 2018-02-25 MED ORDER — LISINOPRIL 40 MG PO TABS: 40.0000 mg | ORAL_TABLET | Freq: Every day | ORAL | 11 refills | Status: DC

## 2018-02-25 NOTE — Telephone Encounter (Signed)
New prescription for Lisinopril 40 mg daily sent.  Thanks.

## 2018-02-27 ENCOUNTER — Other Ambulatory Visit: Payer: Self-pay | Admitting: Family Medicine

## 2018-02-27 MED ORDER — ALPRAZOLAM 1 MG PO TABS: 1.0000 mg | ORAL_TABLET | Freq: Every day | ORAL | 0 refills | Status: DC | PRN

## 2018-02-27 NOTE — Telephone Encounter (Signed)
I have refilled patient's Xanax for a total of 15 pills, which is 5 fewer than previously.  We will need to continue weaning down this medication.

## 2018-02-27 NOTE — Telephone Encounter (Signed)
Contacted pt and informed her of below and she said that she also needs refill on her Xanax.  Routing to PCP. Katharina Caper, Zenab Gronewold D, [place]

## 2018-02-27 NOTE — Telephone Encounter (Signed)
Pt informed of below. Zimmerman Rumple, April D, CMA  

## 2018-03-05 ENCOUNTER — Telehealth: Payer: Self-pay | Admitting: Family Medicine

## 2018-03-05 NOTE — Telephone Encounter (Signed)
Disability form dropped off for at front desk for completion.  Verified that patient section of form has been completed.  Last DOS/WCC with PCP was 11/18/17.  Placed form in team folder to be completed by clinical staff.  Crista Luria

## 2018-03-09 NOTE — Telephone Encounter (Signed)
Clinical info completed on Discharge form.  Place form in Dr. Maudie Flakes box for completion.  Katharina Caper, Diasha Castleman D, [place]

## 2018-03-13 NOTE — Telephone Encounter (Signed)
Faxed as requested to [phone].  Copy placed in batch scanning  Original up front to pick up. Fleeger, Salome Spotted, CMA

## 2018-03-13 NOTE — Telephone Encounter (Signed)
I've placed [name]'s completed form in the nursing folder.  Thanks.

## 2018-05-02 ENCOUNTER — Other Ambulatory Visit: Payer: Self-pay | Admitting: Family Medicine

## 2018-05-05 ENCOUNTER — Ambulatory Visit
Admission: RE | Admit: 2018-05-05 | Discharge: 2018-05-05 | Disposition: A | Discharge status: Home / Self Care | Payer: Medicare HMO | Source: Ambulatory Visit | Attending: Family | Admitting: Family

## 2018-05-05 ENCOUNTER — Other Ambulatory Visit: Payer: Self-pay | Admitting: Family

## 2018-05-05 DIAGNOSIS — Z1231 Encounter for screening mammogram for malignant neoplasm of breast: Secondary | ICD-10-CM

## 2018-06-03 ENCOUNTER — Encounter: Payer: Self-pay | Admitting: Gastroenterology

## 2018-07-14 ENCOUNTER — Ambulatory Visit: Payer: Medicare HMO | Admitting: Gastroenterology

## 2018-07-15 ENCOUNTER — Ambulatory Visit: Payer: Medicare HMO | Admitting: Gastroenterology

## 2018-08-03 ENCOUNTER — Encounter: Payer: Self-pay | Admitting: Gastroenterology

## 2018-09-16 ENCOUNTER — Other Ambulatory Visit: Payer: Self-pay | Admitting: Pediatric Intensive Care

## 2018-09-16 DIAGNOSIS — Z20822 Contact with and (suspected) exposure to covid-19: Secondary | ICD-10-CM

## 2018-09-16 NOTE — Progress Notes (Signed)
lab

## 2018-09-20 LAB — SPECIMEN STATUS REPORT

## 2018-09-20 LAB — NOVEL CORONAVIRUS, NAA: SARS-CoV-2, NAA: NOT DETECTED

## 2018-10-22 ENCOUNTER — Telehealth: Payer: Self-pay | Admitting: Licensed Clinical Social Worker

## 2018-10-22 NOTE — Telephone Encounter (Signed)
Care Coordination  Telephone Outreach Note  10/22/2018 Name: Shannon Stuart MRN: [id number] DOB: 12-13-1960  Referred by: self Reason for referral : Advice Only and Care Coordination Total time: 20 minutes LCSW received voice message and returned phone call to [name]   ASSESSMENT: Shannon Stuart is a 58 y.o. year old female who sees Winfrey, Alcario Drought, MD for primary care. Patient states she is currently experiencing symptoms of PTSD and wants to see LCSW for ongoing therapy.    GOALS: Get patient connected to resources to meet her mental health needs  Intervention:Client interviewed and appropriate assessments performed. Review of patient's consultants notes from appropriate care team members was performed as part of care coordination referral. This also includes locating resources and coordinating care for patient. LCSW made several calls to providers to assist patient with locating a therapist.  Insurance was a barrier.  Discussed: how patient has been managing, LCSW is unable to meet patient's mental health needs and therapy options that takes patient insurance,    Recommendation for Patient: Call insurance provider to get a list of in-network mental health providers. Plan: 1. Patient will call LCSW once she obtains the information from her insurance provider 2. LCSW will review list with patient and assist her with selecting a provider 3. If no return call is received, will F/U via phone in 7 business days   [name], [place] / [place]   [phone] 11:28 AM

## 2018-11-03 NOTE — Telephone Encounter (Signed)
   Unsuccessful Phone Outreach Note  11/03/2018 Name: Shannon Stuart MRN: [id number] DOB: [birth year]  Reason for referral : Advice Only and Care Coordination ;.  Shannon Stuart is a 58 y.o. year old female who sees Winfrey, Alcario Drought, MD for primary care.   F/U call to patient to see if she received counseling resources from her insurance provider and to share resources. Telephone outreach was unsuccessful. A HIPPA compliant phone message was left for the patient providing contact information and requesting a return call.  Follow Up Plan:  If no return call is received. LCSW will call again in 7 to 10 days.  [name], LCSW Clinical Social Worker [place] / [place]   [phone] 1:20 PM

## 2018-11-10 ENCOUNTER — Encounter (HOSPITAL_COMMUNITY): Payer: Self-pay | Admitting: Emergency Medicine

## 2018-11-10 ENCOUNTER — Emergency Department (HOSPITAL_COMMUNITY)
Admission: EM | Admit: 2018-11-10 | Discharge: 2018-11-10 | Disposition: A | Discharge status: Home / Self Care | Payer: Medicare HMO | Source: Home / Self Care | Attending: Emergency Medicine | Admitting: Emergency Medicine

## 2018-11-10 ENCOUNTER — Other Ambulatory Visit: Payer: Self-pay

## 2018-11-10 ENCOUNTER — Emergency Department (HOSPITAL_COMMUNITY): Payer: Medicare HMO

## 2018-11-10 DIAGNOSIS — Z79899 Other long term (current) drug therapy: Secondary | ICD-10-CM | POA: Insufficient documentation

## 2018-11-10 DIAGNOSIS — M899 Disorder of bone, unspecified: Secondary | ICD-10-CM

## 2018-11-10 DIAGNOSIS — Z87891 Personal history of nicotine dependence: Secondary | ICD-10-CM | POA: Insufficient documentation

## 2018-11-10 DIAGNOSIS — R1084 Generalized abdominal pain: Secondary | ICD-10-CM | POA: Diagnosis not present

## 2018-11-10 DIAGNOSIS — K529 Noninfective gastroenteritis and colitis, unspecified: Secondary | ICD-10-CM | POA: Diagnosis not present

## 2018-11-10 DIAGNOSIS — I10 Essential (primary) hypertension: Secondary | ICD-10-CM | POA: Insufficient documentation

## 2018-11-10 LAB — COMPREHENSIVE METABOLIC PANEL
ALT: 24 U/L (ref 0–44)
AST: 26 U/L (ref 15–41)
Albumin: 3.9 g/dL (ref 3.5–5.0)
Alkaline Phosphatase: 76 U/L (ref 38–126)
Anion gap: 13 (ref 5–15)
BUN: 13 mg/dL (ref 6–20)
CO2: 21 mmol/L — ABNORMAL LOW (ref 22–32)
Calcium: 9 mg/dL (ref 8.9–10.3)
Chloride: 107 mmol/L (ref 98–111)
Creatinine, Ser: 1.14 mg/dL — ABNORMAL HIGH (ref 0.44–1.00)
GFR calc Af Amer: >60 mL/min (ref >60)
GFR calc non Af Amer: 52 mL/min — ABNORMAL LOW (ref >60)
Glucose, Bld: 147 mg/dL — ABNORMAL HIGH (ref 70–99)
Potassium: 3.9 mmol/L (ref 3.5–5.1)
Sodium: 141 mmol/L (ref 135–145)
Total Bilirubin: 0.5 mg/dL (ref 0.3–1.2)
Total Protein: 6.2 g/dL — ABNORMAL LOW (ref 6.5–8.1)

## 2018-11-10 LAB — URINALYSIS, ROUTINE W REFLEX MICROSCOPIC
Bilirubin Urine: NEGATIVE
Glucose, UA: NEGATIVE mg/dL
Hgb urine dipstick: NEGATIVE
Ketones, ur: 5 mg/dL — AB
Leukocytes,Ua: NEGATIVE
Nitrite: NEGATIVE
Protein, ur: NEGATIVE mg/dL
Specific Gravity, Urine: 1.009 (ref 1.005–1.030)
pH: 8 (ref 5.0–8.0)

## 2018-11-10 LAB — CBC
HCT: 45.7 % (ref 36.0–46.0)
Hemoglobin: 15 g/dL (ref 12.0–15.0)
MCH: 29 pg (ref 26.0–34.0)
MCHC: 32.8 g/dL (ref 30.0–36.0)
MCV: 88.2 fL (ref 80.0–100.0)
Platelets: 277 10*3/uL (ref 150–400)
RBC: 5.18 MIL/uL — ABNORMAL HIGH (ref 3.87–5.11)
RDW: 15.2 % (ref 11.5–15.5)
WBC: 9.1 10*3/uL (ref 4.0–10.5)
nRBC: 0 % (ref 0.0–0.2)

## 2018-11-10 LAB — LIPASE, BLOOD: Lipase: 24 U/L (ref 11–51)

## 2018-11-10 MED ORDER — HYDROCODONE-ACETAMINOPHEN 5-325 MG PO TABS: 2.0000 | ORAL_TABLET | ORAL | 0 refills | Status: DC | PRN

## 2018-11-10 MED ORDER — HYDROMORPHONE HCL 1 MG/ML IJ SOLN
1.0000 mg | Freq: Once | INTRAMUSCULAR | Status: AC
  Administered 2018-11-10: 1 mg via INTRAVENOUS
  Filled 2018-11-10: qty 1

## 2018-11-10 MED ORDER — PROMETHAZINE HCL 25 MG/ML IJ SOLN
12.5000 mg | Freq: Once | INTRAMUSCULAR | Status: AC
  Administered 2018-11-10: 12.5 mg via INTRAVENOUS
  Filled 2018-11-10: qty 1

## 2018-11-10 MED ORDER — IOHEXOL 300 MG/ML  SOLN
100.0000 mL | Freq: Once | INTRAMUSCULAR | Status: AC | PRN
  Administered 2018-11-10: 100 mL via INTRAVENOUS

## 2018-11-10 MED ORDER — PROMETHAZINE HCL 25 MG PO TABS: 25.0000 mg | ORAL_TABLET | Freq: Four times a day (QID) | ORAL | 0 refills | Status: DC | PRN

## 2018-11-10 MED ORDER — SODIUM CHLORIDE 0.9 % IV BOLUS
1000.0000 mL | Freq: Once | INTRAVENOUS | Status: AC
  Administered 2018-11-10: 1000 mL via INTRAVENOUS

## 2018-11-10 MED ORDER — SODIUM CHLORIDE 0.9% FLUSH
3.0000 mL | Freq: Once | INTRAVENOUS | Status: AC
  Administered 2018-11-10: 3 mL via INTRAVENOUS

## 2018-11-10 NOTE — ED Notes (Signed)
Patient transported to CT 

## 2018-11-10 NOTE — Discharge Instructions (Signed)
Hydrocodone as prescribed as needed for pain.  Phenergan as prescribed as needed for nausea.  You should follow-up with oncology to discuss the abnormal findings on your CT scan from this evening.  The contact information for Dr. Simeon Craft such and the oncology clinic has been provided in this discharge summary for you to call and make these arrangements.

## 2018-11-10 NOTE — ED Triage Notes (Signed)
Pt states she woke up this morning at 7am with generalized abd cramping and constant n/v. Pt has dry heaving in triage- pt states allergic to Zofran so unable to administer in triage
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
## 2018-11-10 NOTE — ED Notes (Signed)
Pt requesting more pain and nausea medication.

## 2018-11-11 ENCOUNTER — Other Ambulatory Visit: Payer: Self-pay

## 2018-11-11 ENCOUNTER — Telehealth: Payer: Self-pay | Admitting: Hematology

## 2018-11-11 ENCOUNTER — Emergency Department (HOSPITAL_COMMUNITY): Payer: Medicare HMO

## 2018-11-11 ENCOUNTER — Inpatient Hospital Stay (HOSPITAL_COMMUNITY)
Admission: EM | Admit: 2018-11-11 | Discharge: 2018-11-14 | DRG: 392 | Disposition: A | Discharge status: Home / Self Care | Payer: Medicare HMO | Attending: Family Medicine | Admitting: Family Medicine

## 2018-11-11 DIAGNOSIS — G8929 Other chronic pain: Secondary | ICD-10-CM | POA: Diagnosis present

## 2018-11-11 DIAGNOSIS — F419 Anxiety disorder, unspecified: Secondary | ICD-10-CM | POA: Diagnosis present

## 2018-11-11 DIAGNOSIS — Z87891 Personal history of nicotine dependence: Secondary | ICD-10-CM

## 2018-11-11 DIAGNOSIS — Z79899 Other long term (current) drug therapy: Secondary | ICD-10-CM

## 2018-11-11 DIAGNOSIS — G4700 Insomnia, unspecified: Secondary | ICD-10-CM | POA: Diagnosis present

## 2018-11-11 DIAGNOSIS — R1110 Vomiting, unspecified: Secondary | ICD-10-CM | POA: Diagnosis present

## 2018-11-11 DIAGNOSIS — E785 Hyperlipidemia, unspecified: Secondary | ICD-10-CM | POA: Diagnosis present

## 2018-11-11 DIAGNOSIS — N179 Acute kidney failure, unspecified: Secondary | ICD-10-CM | POA: Diagnosis present

## 2018-11-11 DIAGNOSIS — M170 Bilateral primary osteoarthritis of knee: Secondary | ICD-10-CM | POA: Diagnosis present

## 2018-11-11 DIAGNOSIS — I2510 Atherosclerotic heart disease of native coronary artery without angina pectoris: Secondary | ICD-10-CM | POA: Diagnosis present

## 2018-11-11 DIAGNOSIS — Z885 Allergy status to narcotic agent status: Secondary | ICD-10-CM

## 2018-11-11 DIAGNOSIS — Z6835 Body mass index (BMI) 35.0-35.9, adult: Secondary | ICD-10-CM

## 2018-11-11 DIAGNOSIS — C9000 Multiple myeloma not having achieved remission: Secondary | ICD-10-CM | POA: Diagnosis present

## 2018-11-11 DIAGNOSIS — I10 Essential (primary) hypertension: Secondary | ICD-10-CM | POA: Diagnosis present

## 2018-11-11 DIAGNOSIS — R1084 Generalized abdominal pain: Secondary | ICD-10-CM

## 2018-11-11 DIAGNOSIS — K219 Gastro-esophageal reflux disease without esophagitis: Secondary | ICD-10-CM | POA: Diagnosis present

## 2018-11-11 DIAGNOSIS — R112 Nausea with vomiting, unspecified: Secondary | ICD-10-CM

## 2018-11-11 DIAGNOSIS — M545 Low back pain: Secondary | ICD-10-CM | POA: Diagnosis present

## 2018-11-11 DIAGNOSIS — Z8249 Family history of ischemic heart disease and other diseases of the circulatory system: Secondary | ICD-10-CM

## 2018-11-11 DIAGNOSIS — E669 Obesity, unspecified: Secondary | ICD-10-CM | POA: Diagnosis present

## 2018-11-11 DIAGNOSIS — Z888 Allergy status to other drugs, medicaments and biological substances status: Secondary | ICD-10-CM

## 2018-11-11 DIAGNOSIS — K529 Noninfective gastroenteritis and colitis, unspecified: Principal | ICD-10-CM | POA: Diagnosis present

## 2018-11-11 DIAGNOSIS — E860 Dehydration: Secondary | ICD-10-CM | POA: Diagnosis present

## 2018-11-11 DIAGNOSIS — F329 Major depressive disorder, single episode, unspecified: Secondary | ICD-10-CM | POA: Diagnosis present

## 2018-11-11 DIAGNOSIS — M160 Bilateral primary osteoarthritis of hip: Secondary | ICD-10-CM | POA: Diagnosis present

## 2018-11-11 DIAGNOSIS — Z20828 Contact with and (suspected) exposure to other viral communicable diseases: Secondary | ICD-10-CM | POA: Diagnosis present

## 2018-11-11 DIAGNOSIS — R197 Diarrhea, unspecified: Secondary | ICD-10-CM

## 2018-11-11 DIAGNOSIS — D72829 Elevated white blood cell count, unspecified: Secondary | ICD-10-CM | POA: Insufficient documentation

## 2018-11-11 DIAGNOSIS — E876 Hypokalemia: Secondary | ICD-10-CM

## 2018-11-11 LAB — CBC
HCT: 47.2 % — ABNORMAL HIGH (ref 36.0–46.0)
Hemoglobin: 15.2 g/dL — ABNORMAL HIGH (ref 12.0–15.0)
MCH: 28.5 pg (ref 26.0–34.0)
MCHC: 32.2 g/dL (ref 30.0–36.0)
MCV: 88.4 fL (ref 80.0–100.0)
Platelets: 283 10*3/uL (ref 150–400)
RBC: 5.34 MIL/uL — ABNORMAL HIGH (ref 3.87–5.11)
RDW: 15 % (ref 11.5–15.5)
WBC: 16.1 10*3/uL — ABNORMAL HIGH (ref 4.0–10.5)
nRBC: 0 % (ref 0.0–0.2)

## 2018-11-11 LAB — URINALYSIS, ROUTINE W REFLEX MICROSCOPIC
Bacteria, UA: NONE SEEN
Bilirubin Urine: NEGATIVE
Glucose, UA: NEGATIVE mg/dL
Ketones, ur: 5 mg/dL — AB
Leukocytes,Ua: NEGATIVE
Nitrite: NEGATIVE
Protein, ur: NEGATIVE mg/dL
Specific Gravity, Urine: 1.008 (ref 1.005–1.030)
pH: 6 (ref 5.0–8.0)

## 2018-11-11 LAB — COMPREHENSIVE METABOLIC PANEL
ALT: 33 U/L (ref 0–44)
AST: 39 U/L (ref 15–41)
Albumin: 4.3 g/dL (ref 3.5–5.0)
Alkaline Phosphatase: 70 U/L (ref 38–126)
Anion gap: 15 (ref 5–15)
BUN: 9 mg/dL (ref 6–20)
CO2: 25 mmol/L (ref 22–32)
Calcium: 9 mg/dL (ref 8.9–10.3)
Chloride: 98 mmol/L (ref 98–111)
Creatinine, Ser: 1.01 mg/dL — ABNORMAL HIGH (ref 0.44–1.00)
GFR calc Af Amer: >60 mL/min (ref >60)
GFR calc non Af Amer: >60 mL/min (ref >60)
Glucose, Bld: 146 mg/dL — ABNORMAL HIGH (ref 70–99)
Potassium: 3 mmol/L — ABNORMAL LOW (ref 3.5–5.1)
Sodium: 138 mmol/L (ref 135–145)
Total Bilirubin: 1.2 mg/dL (ref 0.3–1.2)
Total Protein: 6.9 g/dL (ref 6.5–8.1)

## 2018-11-11 LAB — RAPID URINE DRUG SCREEN, HOSP PERFORMED
Amphetamines: NOT DETECTED
Barbiturates: NOT DETECTED
Benzodiazepines: NOT DETECTED
Cocaine: NOT DETECTED
Opiates: NOT DETECTED
Tetrahydrocannabinol: POSITIVE — AB

## 2018-11-11 LAB — LIPASE, BLOOD: Lipase: 28 U/L (ref 11–51)

## 2018-11-11 MED ORDER — HYDROMORPHONE HCL 1 MG/ML IJ SOLN
0.5000 mg | Freq: Once | INTRAMUSCULAR | Status: AC
  Administered 2018-11-11: 0.5 mg via INTRAVENOUS
  Filled 2018-11-11: qty 1

## 2018-11-11 MED ORDER — SODIUM CHLORIDE 0.9% FLUSH
3.0000 mL | Freq: Once | INTRAVENOUS | Status: AC
  Administered 2018-11-11: 3 mL via INTRAVENOUS

## 2018-11-11 MED ORDER — POTASSIUM CHLORIDE CRYS ER 20 MEQ PO TBCR
40.0000 meq | EXTENDED_RELEASE_TABLET | Freq: Once | ORAL | Status: DC
  Filled 2018-11-11: qty 2

## 2018-11-11 MED ORDER — ALPRAZOLAM 0.5 MG PO TABS
0.5000 mg | ORAL_TABLET | Freq: Two times a day (BID) | ORAL | Status: DC | PRN
  Administered 2018-11-12 – 2018-11-14 (×4): 0.5 mg via ORAL
  Filled 2018-11-11 (×4): qty 1

## 2018-11-11 MED ORDER — SODIUM CHLORIDE 0.9 % IV BOLUS
1000.0000 mL | Freq: Once | INTRAVENOUS | Status: AC
  Administered 2018-11-11: 1000 mL via INTRAVENOUS

## 2018-11-11 MED ORDER — MORPHINE SULFATE (PF) 2 MG/ML IV SOLN: 2.0000 mg | INTRAVENOUS | Status: DC | PRN

## 2018-11-11 MED ORDER — RAMELTEON 8 MG PO TABS
8.0000 mg | ORAL_TABLET | Freq: Every day | ORAL | Status: DC
  Administered 2018-11-12 – 2018-11-13 (×2): 8 mg via ORAL
  Filled 2018-11-11 (×3): qty 1

## 2018-11-11 MED ORDER — ENOXAPARIN SODIUM 40 MG/0.4ML ~~LOC~~ SOLN
40.0000 mg | SUBCUTANEOUS | Status: DC
  Administered 2018-11-12 – 2018-11-14 (×3): 40 mg via SUBCUTANEOUS
  Filled 2018-11-11 (×3): qty 0.4

## 2018-11-11 MED ORDER — ACETAMINOPHEN 650 MG RE SUPP
650.0000 mg | Freq: Four times a day (QID) | RECTAL | Status: DC | PRN
  Administered 2018-11-13: 650 mg via RECTAL
  Filled 2018-11-11: qty 1

## 2018-11-11 MED ORDER — ACETAMINOPHEN 325 MG PO TABS
650.0000 mg | ORAL_TABLET | Freq: Four times a day (QID) | ORAL | Status: DC | PRN
  Administered 2018-11-12: 650 mg via ORAL
  Filled 2018-11-11: qty 2

## 2018-11-11 MED ORDER — PROMETHAZINE HCL 25 MG/ML IJ SOLN
12.5000 mg | Freq: Once | INTRAMUSCULAR | Status: AC
  Administered 2018-11-11: 12.5 mg via INTRAVENOUS
  Filled 2018-11-11: qty 1

## 2018-11-11 MED ORDER — POTASSIUM CHLORIDE IN NACL 40-0.9 MEQ/L-% IV SOLN
INTRAVENOUS | Status: AC
  Administered 2018-11-12: 125 mL/h via INTRAVENOUS
  Filled 2018-11-11: qty 1000

## 2018-11-11 MED ORDER — DICLOFENAC SODIUM 1 % TD GEL
2.0000 g | Freq: Every day | TRANSDERMAL | Status: DC | PRN
  Filled 2018-11-11: qty 100

## 2018-11-11 MED ORDER — PANTOPRAZOLE SODIUM 40 MG PO TBEC
40.0000 mg | DELAYED_RELEASE_TABLET | Freq: Every day | ORAL | Status: DC
  Administered 2018-11-13 – 2018-11-14 (×2): 40 mg via ORAL
  Filled 2018-11-11 (×3): qty 1

## 2018-11-11 MED ORDER — PROMETHAZINE HCL 25 MG/ML IJ SOLN
12.5000 mg | INTRAMUSCULAR | Status: DC | PRN
  Administered 2018-11-12 – 2018-11-14 (×11): 12.5 mg via INTRAVENOUS
  Filled 2018-11-11 (×11): qty 1

## 2018-11-11 MED ORDER — HALOPERIDOL LACTATE 5 MG/ML IJ SOLN
2.0000 mg | Freq: Once | INTRAMUSCULAR | Status: AC
  Administered 2018-11-11: 2 mg via INTRAMUSCULAR
  Filled 2018-11-11: qty 1

## 2018-11-11 MED ORDER — LISINOPRIL 40 MG PO TABS
40.0000 mg | ORAL_TABLET | Freq: Every day | ORAL | Status: DC
  Administered 2018-11-13 – 2018-11-14 (×2): 40 mg via ORAL
  Filled 2018-11-11 (×3): qty 1

## 2018-11-11 MED ORDER — AMLODIPINE BESYLATE 10 MG PO TABS
10.0000 mg | ORAL_TABLET | Freq: Every day | ORAL | Status: DC
  Administered 2018-11-13 – 2018-11-14 (×2): 10 mg via ORAL
  Filled 2018-11-11 (×3): qty 1

## 2018-11-11 NOTE — Telephone Encounter (Signed)
Scheduled appt per 8/19 sch message- pt is aware of appt date and time

## 2018-11-11 NOTE — ED Notes (Signed)
Pt to bathroom

## 2018-11-11 NOTE — ED Triage Notes (Signed)
Pt here for evaluation of continuing generalized abdominal pain and constant n/v. Seen for same yesterday but sts she can't take PO rx.
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
## 2018-11-11 NOTE — Discharge Summary (Signed)
[facility] Discharge Summary  Patient name: Shannon Stuart Medical record number: [id number] Date of birth: February 03, 1961 Age: 58 y.o. Gender: female Date of Admission: 11/11/2018  Date of Discharge: 11/14/2018 Admitting Physician: Kinnie Feil, MD  Primary Care Provider: Kathrene Alu, MD Consultants: None   Indication for Hospitalization: dehydration secondary to prolonged emesis and diarrhea   Discharge Diagnoses/Problem List:  Acute gastroenteritis Lytic lesions likely due to multiple myeloma Hypokalemia Elevated creatinine, questionable AKI HTN Anxiety Chronic back pain Gastroesophageal reflux disease  Disposition: Discharged home  Discharge Condition: Stable  Discharge Exam:  General: Patient resting comfortably in bed, alert and cooperative HEENT: Normocephalic, atraumatic Cardio: Normal S1 and S2, no S3 or S4. Rhythm is regular. No murmurs or rubs.   Pulm: Clear to auscultation bilaterally, no crackles, wheezing, or diminished breath sounds. Normal respiratory effort Abdomen: Bowel sounds normal. Abdomen soft and diffusely tender to palpation but decreased from previous exams. Extremities: No peripheral edema. Warm/ well perfused.   Neuro: Cranial nerves grossly intact   Brief Hospital Course:  Patient is a 58 year old female with past medical history of depression, anxiety, obesity, endometriosis, hypertension, hyperlipidemia, OA, cyclic vomiting who presented to the emergency room after a few days history of nausea, vomiting, and diarrhea.  She had come to the emergency room 8/18 and was given p.o. Phenergan and discharged.  She was unable to keep anything down by mouth the next day so returned to the ED for further evaluation.  She was admitted started on IV Phenergan with mild improvement.  8/20 patient continued having difficulty keeping anything down by mouth.  She complained of abdominal pain and was initially given Dilaudid but that  was transitioned to Tylenol and then she received 2 doses of tramadol.  She was hypokalemic so her potassium and mag were repleted.  She was on IV fluid hydration and liquid diet.  Patient's concerns are that this occurs 3-4 times a year and Phenergan helps with her but because she cannot keep anything down by mouth she requests suppository Phenergan but her insurance would not cover it and she cannot afford it.  Patient was planned for discharge 8/21 but she failed her p.o. challenge and voiced concerns that she was not getting better.  Patient received Protonix 8/21 and 8/22.  Patient had increased sleep over night into 8/22 and requested full diet 8/22 AM.  Patient discharged with close follow-up.  Issues for Follow Up:  1. Lytic Lesions on CT concerning for Multiple Myeloma  2. Emesis and PO intake toleration.  3. Elevated BP inpatient (696-789'F systolic), continue to monitor 4. Discuss cessation of marijuana and clinical evaluation for cyclic vomiting 5. Resources or help with suppository Phenergan  Significant Procedures:   Significant Labs and Imaging:  Recent Labs  Lab 11/11/18 1350 11/12/18 0537 11/13/18 0338  WBC 16.1* 13.0* 10.1  HGB 15.2* 14.0 12.9  HCT 47.2* 42.8 40.5  PLT 283 243 200   Recent Labs  Lab 11/10/18 1225 11/11/18 1350 11/12/18 0537 11/12/18 1504 11/13/18 0338  NA 141 138 137  --  136  K 3.9 3.0* 3.0* 3.0* 3.8  CL 107 98 100  --  103  CO2 21* 25 25  --  25  GLUCOSE 147* 146* 128*  --  118*  BUN '13 9 10  '$ --  9  CREATININE 1.14* 1.01* 1.02*  --  1.10*  CALCIUM 9.0 9.0 8.1*  --  7.7*  MG  --   --   --   --  2.4  ALKPHOS 76 70  --   --   --   AST 26 39  --   --   --   ALT 24 33  --   --   --   ALBUMIN 3.9 4.3  --   --   --    Mag 2.4  Ct Abdomen Pelvis W Contrast  Result Date: 11/10/2018 CLINICAL DATA:  Abdominal pain, nausea and vomiting. EXAM: CT ABDOMEN AND PELVIS WITH CONTRAST TECHNIQUE: Multidetector CT imaging of the abdomen and pelvis was  performed using the standard protocol following bolus administration of intravenous contrast. CONTRAST:  168m OMNIPAQUE IOHEXOL 300 MG/ML  SOLN COMPARISON:  February 20, 2017 FINDINGS: Lower chest: No acute abnormality. Hepatobiliary: Hepatic steatosis. Post cholecystectomy. No complicating features. Pancreas: Unremarkable. No pancreatic ductal dilatation or surrounding inflammatory changes. Spleen: Normal in size without focal abnormality. Adrenals/Urinary Tract: Adrenal glands are unremarkable. Kidneys are without renal calculi or hydronephrosis. Bilateral too small to be actually characterize hypoattenuated nodules seen. Bladder is unremarkable. Stomach/Bowel: Stomach is within normal limits. Appendix appears normal. No evidence of bowel wall thickening, distention, or inflammatory changes. Vascular/Lymphatic: No significant vascular findings are present. No enlarged abdominal or pelvic lymph nodes. Reproductive: Uterus and bilateral adnexa are unremarkable. Other: No abdominal wall hernia or abnormality. No abdominopelvic ascites. Musculoskeletal: Innumerable small, few mm, lytic lesions throughout the vertebral bodies and pelvic bones. Osteoarthritic changes with sclerosis and subchondral cysts of the left hip. Spondylosis of the lumbosacral spine. IMPRESSION: 1. Hepatic steatosis. 2. No evidence of acute abnormalities within the solid abdominal organs. 3. Innumerable small, few mm, lytic lesions throughout the vertebral bodies and pelvic bones. Differential diagnosis includes multiple myeloma, metastatic disease or bone marrow abnormality. 4. Erosive versus postsurgical changes of the spinous process of L5, appear not significantly changed from November 2018. Electronically Signed   By: DFidela SalisburyM.D.   On: 11/10/2018 17:16   Dg Abdomen Acute W/chest  Result Date: 11/12/2018 CLINICAL DATA:  Upper abdominal pain for awhile, vomiting since yesterday, history hypertension, coronary artery disease,  former smoker, GERD, endometriosis EXAM: DG ABDOMEN ACUTE W/ 1V CHEST COMPARISON:  Chest radiograph 02/21/2017, CT abdomen and pelvis 11/10/2018 FINDINGS: Normal heart size, mediastinal contours, and pulmonary vascularity. Lungs clear. No pulmonary infiltrate, pleural effusion or pneumothorax. Surgical clips RIGHT upper quadrant due to cholecystectomy. Normal bowel gas pattern. No bowel dilatation, bowel wall thickening, or free air. No urinary tract calcifications. Diffuse osteosclerosis with degenerative changes of the lower lumbar spine and LEFT hip. IMPRESSION: No acute cardiopulmonary abnormalities. Nonobstructive bowel gas pattern. Diffuse osseous sclerosis; this is nonspecific and could be related to metabolic bone disease, renal osteodystrophy, and multiple other etiologies including metastatic disease, mastocytosis, and myelofibrosis. Electronically Signed   By: MLavonia DanaM.D.   On: 11/12/2018 08:41    Results/Tests Pending at Time of Discharge: None  Discharge Medications:  Allergies as of 11/14/2018      Reactions   Zofran [Alvis LemmingsHcl] Nausea And Vomiting   Morphine And Related Other (See Comments)   Makes "loopy" and chest "feel funny".       Medication List    STOP taking these medications   HYDROcodone-acetaminophen 5-325 MG tablet Commonly known as: Norco     TAKE these medications   ALPRAZolam 1 MG tablet Commonly known as: XANAX Take 1 tablet (1 mg total) by mouth daily as needed for anxiety. What changed:   how much to take  when to take this   amLODipine 10  MG tablet Commonly known as: NORVASC Take 1 tablet (10 mg total) by mouth daily.   capsaicin 0.025 % cream Commonly known as: ZOSTRIX Apply topically 2 (two) times daily. Apply to abdomen   cyclobenzaprine 10 MG tablet Commonly known as: FLEXERIL TAKE 1 TABLET BY MOUTH THREE TIMES A DAY AS NEEDED FOR MUSCLE SPASMS   diclofenac sodium 1 % Gel Commonly known as: VOLTAREN Apply 1 application  topically daily as needed (pain).   Fish Oil 1000 MG Caps Take 1 capsule by mouth daily.   ibuprofen 800 MG tablet Commonly known as: ADVIL TAKE 1 TABLET BY MOUTH EVERY 8 HOURS AS NEEDED FOR MODERATE PAIN What changed: See the new instructions.   lisinopril 40 MG tablet Commonly known as: ZESTRIL Take 1 tablet (40 mg total) by mouth daily.   pantoprazole 40 MG tablet Commonly known as: PROTONIX Take 1 tablet (40 mg total) by mouth daily.   Potassium 99 MG Tabs Take 1 tablet by mouth See admin instructions. Take on Mondays and Fridays   promethazine 25 MG tablet Commonly known as: PHENERGAN Take 1 tablet (25 mg total) by mouth every 6 (six) hours as needed for nausea.   traMADol 50 MG tablet Commonly known as: ULTRAM Take 1 tablet (50 mg total) by mouth daily as needed for up to 5 doses for moderate pain.   zolpidem 5 MG tablet Commonly known as: AMBIEN Take 1 tablet (5 mg total) by mouth at bedtime as needed for sleep.       Discharge Instructions: Please refer to Patient Instructions section of EMR for full details.  Patient was counseled important signs and symptoms that should prompt return to medical care, changes in medications, dietary instructions, activity restrictions, and follow up appointments.   Follow-Up Appointments:   Gifford Shave, MD 11/14/2018, 11:16 AM PGY-1, [place]

## 2018-11-11 NOTE — ED Notes (Signed)
Pt asking for more pain med  She has been drinking water getting gingerale for her

## 2018-11-11 NOTE — ED Notes (Signed)
To the br.
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
## 2018-11-12 DIAGNOSIS — R197 Diarrhea, unspecified: Secondary | ICD-10-CM

## 2018-11-12 DIAGNOSIS — R1084 Generalized abdominal pain: Secondary | ICD-10-CM | POA: Diagnosis not present

## 2018-11-12 DIAGNOSIS — R112 Nausea with vomiting, unspecified: Secondary | ICD-10-CM

## 2018-11-12 DIAGNOSIS — R1110 Vomiting, unspecified: Secondary | ICD-10-CM

## 2018-11-12 DIAGNOSIS — E876 Hypokalemia: Secondary | ICD-10-CM | POA: Diagnosis not present

## 2018-11-12 LAB — BASIC METABOLIC PANEL
Anion gap: 12 (ref 5–15)
BUN: 10 mg/dL (ref 6–20)
CO2: 25 mmol/L (ref 22–32)
Calcium: 8.1 mg/dL — ABNORMAL LOW (ref 8.9–10.3)
Chloride: 100 mmol/L (ref 98–111)
Creatinine, Ser: 1.02 mg/dL — ABNORMAL HIGH (ref 0.44–1.00)
GFR calc Af Amer: >60 mL/min (ref >60)
GFR calc non Af Amer: 60 mL/min — ABNORMAL LOW (ref >60)
Glucose, Bld: 128 mg/dL — ABNORMAL HIGH (ref 70–99)
Potassium: 3 mmol/L — ABNORMAL LOW (ref 3.5–5.1)
Sodium: 137 mmol/L (ref 135–145)

## 2018-11-12 LAB — CBC
HCT: 42.8 % (ref 36.0–46.0)
Hemoglobin: 14 g/dL (ref 12.0–15.0)
MCH: 28.7 pg (ref 26.0–34.0)
MCHC: 32.7 g/dL (ref 30.0–36.0)
MCV: 87.7 fL (ref 80.0–100.0)
Platelets: 243 10*3/uL (ref 150–400)
RBC: 4.88 MIL/uL (ref 3.87–5.11)
RDW: 15.1 % (ref 11.5–15.5)
WBC: 13 10*3/uL — ABNORMAL HIGH (ref 4.0–10.5)
nRBC: 0 % (ref 0.0–0.2)

## 2018-11-12 LAB — HIV ANTIBODY (ROUTINE TESTING W REFLEX): HIV Screen 4th Generation wRfx: NONREACTIVE

## 2018-11-12 LAB — POTASSIUM: Potassium: 3 mmol/L — ABNORMAL LOW (ref 3.5–5.1)

## 2018-11-12 LAB — SARS CORONAVIRUS 2 (TAT 6-24 HRS): SARS Coronavirus 2: NEGATIVE

## 2018-11-12 MED ORDER — MAGNESIUM SULFATE IN D5W 1-5 GM/100ML-% IV SOLN
1.0000 g | Freq: Once | INTRAVENOUS | Status: AC
  Administered 2018-11-12: 1 g via INTRAVENOUS
  Filled 2018-11-12: qty 100

## 2018-11-12 MED ORDER — SODIUM CHLORIDE 0.9 % IV SOLN
INTRAVENOUS | Status: DC | PRN
  Administered 2018-11-12: 250 mL via INTRAVENOUS

## 2018-11-12 MED ORDER — POTASSIUM CHLORIDE IN NACL 40-0.9 MEQ/L-% IV SOLN
INTRAVENOUS | Status: AC
  Administered 2018-11-12: 125 mL/h via INTRAVENOUS
  Filled 2018-11-12 (×2): qty 1000

## 2018-11-12 MED ORDER — POTASSIUM CHLORIDE 10 MEQ/100ML IV SOLN
10.0000 meq | INTRAVENOUS | Status: AC
  Administered 2018-11-12 – 2018-11-13 (×4): 10 meq via INTRAVENOUS
  Filled 2018-11-12 (×3): qty 100

## 2018-11-12 MED ORDER — HYDROMORPHONE HCL 1 MG/ML IJ SOLN
0.5000 mg | INTRAMUSCULAR | Status: DC | PRN
  Administered 2018-11-12 (×3): 0.5 mg via INTRAVENOUS
  Filled 2018-11-12: qty 0.5
  Filled 2018-11-12: qty 1

## 2018-11-12 MED ORDER — SODIUM CHLORIDE 0.9% FLUSH
10.0000 mL | Freq: Two times a day (BID) | INTRAVENOUS | Status: DC
  Administered 2018-11-12: 13:00:00 10 mL
  Administered 2018-11-13: 20 mL
  Administered 2018-11-14: 10 mL

## 2018-11-12 MED ORDER — PROMETHAZINE HCL 25 MG RE SUPP: 25.0000 mg | Freq: Four times a day (QID) | RECTAL | 1 refills | Status: DC | PRN

## 2018-11-12 MED ORDER — ZOLPIDEM TARTRATE 5 MG PO TABS
5.0000 mg | ORAL_TABLET | Freq: Every evening | ORAL | Status: DC | PRN
  Administered 2018-11-12 – 2018-11-13 (×2): 5 mg via ORAL
  Filled 2018-11-12 (×2): qty 1

## 2018-11-12 MED ORDER — POTASSIUM CHLORIDE 10 MEQ/100ML IV SOLN
10.0000 meq | INTRAVENOUS | Status: DC
  Filled 2018-11-12: qty 100

## 2018-11-12 MED ORDER — SODIUM CHLORIDE 0.9% FLUSH: 10.0000 mL | INTRAVENOUS | Status: DC | PRN

## 2018-11-12 MED ORDER — LABETALOL HCL 5 MG/ML IV SOLN
10.0000 mg | Freq: Once | INTRAVENOUS | Status: AC
  Administered 2018-11-12: 10 mg via INTRAVENOUS
  Filled 2018-11-12: qty 4

## 2018-11-12 MED ORDER — PROMETHAZINE HCL 25 MG RE SUPP: 25.0000 mg | Freq: Four times a day (QID) | RECTAL | 0 refills | Status: DC | PRN
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
## 2018-11-12 NOTE — Plan of Care (Signed)
  Problem: Education: ?Goal: Knowledge of General Education information will improve ?Description: Including pain rating scale, medication(s)/side effects and non-pharmacologic comfort measures ?Outcome: Progressing ?  ?Problem: Health Behavior/Discharge Planning: ?Goal: Ability to manage health-related needs will improve ?Outcome: Progressing ?  ?Problem: Clinical Measurements: ?Goal: Ability to maintain clinical measurements within normal limits will improve ?Outcome: Progressing ?Goal: Will remain free from infection ?Outcome: Progressing ?Goal: Diagnostic test results will improve ?Outcome: Progressing ?Goal: Respiratory complications will improve ?Outcome: Progressing ?Goal: Cardiovascular complication will be avoided ?Outcome: Progressing ?  ?Problem: Activity: ?Goal: Risk for activity intolerance will decrease ?Outcome: Progressing ?  ?Problem: Coping: ?Goal: Level of anxiety will decrease ?Outcome: Progressing ?  ?Problem: Elimination: ?Goal: Will not experience complications related to bowel motility ?Outcome: Progressing ?Goal: Will not experience complications related to urinary retention ?Outcome: Progressing ?  ?Problem: Safety: ?Goal: Ability to remain free from injury will improve ?Outcome: Progressing ?  ?

## 2018-11-12 NOTE — Progress Notes (Signed)
Family Medicine Teaching Service Daily Progress Note Intern Pager: [phone]  Patient name: Shannon Stuart Medical record number: [id number] Date of birth: 12/21/1960 Age: 58 y.o. Gender: female  Primary Care Provider: Kathrene Alu, MD Consultants: None Code Status: Full  Pt Overview and Major Events to Date:  8/19-patient admitted  Assessment and Plan: Shannon Stuart is a 58 y.o. female presenting with abdomial pain and decreased PO intake 2/2 to nausea/emesis and diffuse abdominal pain. PMH is significant for possible multiple myeloma, HTN, HLD, GERD, CAD, chronic low back pain, and anxiety.  Acute gastroenteritis Nausea, emesis, diarrhea for the last 3 days.  Patient has 25 mg of Phenergan at home after being discharged from the ED 8/18 but has been unable to keep anything down by mouth.  WBC-16.1 on admission, hemoglobin 15.2 on admission.  Repeat CBC 8/20 showed downtrending CBC of 13.0.  Patient may be hemoconcentrated secondary to decreased volume from diarrhea.  Patient was still unable to keep anything down by mouth.  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 Patient previously worked up for mulitple myeloma now with lytic lesions found on CT of patient spine.   -patient previousy scheduled for outpatient follow up   Hypokalemia: 3.0 -will replete in mIVFs with 37mq @ rate of 1227mhr for 12 hours -A.m. BMP  Elevated creatinine, Questionable AKI, 1.01 Patient with previously elevated creatinine levels greater than 1 since 2018 with no diagnosis of CKD.  GFR greater than 60 upon admission.  It is possible that this elevated creatinine is due to to prerenal decrease in the setting of prolonged vomiting and diarrhea.   -Will monitor with  BMP -Continue IV fluids  HTN:  BP elevated at 157/108 Home meds include amlodipine 10 mg daily and senna Prill 40 mg daily. -We will continue p.o. amlodipine and lisinopril once patient is tolerating p.o.  Anxiety: Continue alprazolam 0.5 twice daily  Chronic back pain: Continue Voltaren gel as needed  Gastroesophageal reflux disease: Patient on 40 mg Protonix at home. -Continue Protonix 40 mg daily  FEN/GI: Liquid diet PPx: Lovenox 40 mg  Disposition: Home  Subjective:  Patient reports he is still nauseous this morning but feels better than previously in the night and on exam had kept down half of a ginger ale.  Says the only thing that helps her nausea is Phenergan but that because she cannot keep anything down when she takes her Phenergan it just comes back up.  Says that she is requested suppository Phenergan's for outpatient so she does not have to come to the hospital but her insurance would not cover it.  Denies any other issues at this time.  Objective: Temp:  [98.8 F (37.1 C)-100.4 F (38 C)] 99.3 F (37.4 C) (08/20 0623) Pulse Rate:  [83-111] 101 (08/20 0623) Resp:  [12-26] 16 (08/20 0623) BP: (142-168)/(89-114) 168/114 (08/20 0623) SpO2:  [95 %-100 %] 95 % (08/20 068938General: Alert and cooperative resting under the covers. Cardio: Normal S1 and S2, no S3 or S4. Rhythm is regular. No murmurs or rubs.   Pulm: Clear to auscultation bilaterally, no crackles, wheezing, or diminished breath sounds. Normal respiratory effort Abdomen: Positive bowel sounds, abdomen is soft but diffusely tender Extremities: No peripheral edema. Warm/ well  perfused.  Neuro: Cranial nerves grossly intact  Laboratory: Recent Labs  Lab 11/10/18 1225 11/11/18 1350 11/12/18 0537  WBC 9.1 16.1* 13.0*  HGB 15.0 15.2* 14.0  HCT 45.7 47.2* 42.8  PLT 277 283 243   Recent Labs  Lab 11/10/18 1225 11/11/18 1350 11/12/18 0537  NA 141 138 137  K 3.9 3.0* 3.0*  CL 107 98 100  CO2  21* 25 25  BUN '13 9 10  '$ CREATININE 1.14* 1.01* 1.02*  CALCIUM 9.0 9.0 8.1*  PROT 6.2* 6.9  --   BILITOT 0.5 1.2  --   ALKPHOS 76 70  --   ALT 24 33  --   AST 26 39  --   GLUCOSE 147* 146* 128*   Results for Shannon Stuart, Shannon "[name]" (MRN [id number]) as of 11/12/2018 14:12  Ref. Range 11/11/2018 19:50  Amphetamines Latest Ref Range: NONE DETECTED  NONE DETECTED  Barbiturates Latest Ref Range: NONE DETECTED  NONE DETECTED  Benzodiazepines Latest Ref Range: NONE DETECTED  NONE DETECTED  Opiates Latest Ref Range: NONE DETECTED  NONE DETECTED  COCAINE Latest Ref Range: NONE DETECTED  NONE DETECTED  Tetrahydrocannabinol Latest Ref Range: NONE DETECTED  POSITIVE (A)    Imaging/Diagnostic Tests: Ct Abdomen Pelvis W Contrast  Result Date: 11/10/2018 CLINICAL DATA:  Abdominal pain, nausea and vomiting. EXAM: CT ABDOMEN AND PELVIS WITH CONTRAST TECHNIQUE: Multidetector CT imaging of the abdomen and pelvis was performed using the standard protocol following bolus administration of intravenous contrast. CONTRAST:  135m OMNIPAQUE IOHEXOL 300 MG/ML  SOLN COMPARISON:  February 20, 2017 FINDINGS: Lower chest: No acute abnormality. Hepatobiliary: Hepatic steatosis. Post cholecystectomy. No complicating features. Pancreas: Unremarkable. No pancreatic ductal dilatation or surrounding inflammatory changes. Spleen: Normal in size without focal abnormality. Adrenals/Urinary Tract: Adrenal glands are unremarkable. Kidneys are without renal calculi or hydronephrosis. Bilateral too small to be actually characterize hypoattenuated nodules seen. Bladder is unremarkable. Stomach/Bowel: Stomach is within normal limits. Appendix appears normal. No evidence of bowel wall thickening, distention, or inflammatory changes. Vascular/Lymphatic: No significant vascular findings are present. No enlarged abdominal or pelvic lymph nodes. Reproductive: Uterus and bilateral adnexa are unremarkable. Other: No abdominal wall hernia or  abnormality. No abdominopelvic ascites. Musculoskeletal: Innumerable small, few mm, lytic lesions throughout the vertebral bodies and pelvic bones. Osteoarthritic changes with sclerosis and subchondral cysts of the left hip. Spondylosis of the lumbosacral spine. IMPRESSION: 1. Hepatic steatosis. 2. No evidence of acute abnormalities within the solid abdominal organs. 3. Innumerable small, few mm, lytic lesions throughout the vertebral bodies and pelvic bones. Differential diagnosis includes multiple myeloma, metastatic disease or bone marrow abnormality. 4. Erosive versus postsurgical changes of the spinous process of L5, appear not significantly changed from November 2018. Electronically Signed   By: DFidela SalisburyM.D.   On: 11/10/2018 17:16     CGifford Shave MD 11/12/2018, 6:54 AM PGY-1, CBell CityIntern pager: [phone] text pages welcome

## 2018-11-13 ENCOUNTER — Ambulatory Visit: Payer: 59 | Admitting: Gastroenterology

## 2018-11-13 ENCOUNTER — Telehealth: Payer: Self-pay | Admitting: *Deleted

## 2018-11-13 DIAGNOSIS — M160 Bilateral primary osteoarthritis of hip: Secondary | ICD-10-CM | POA: Diagnosis present

## 2018-11-13 DIAGNOSIS — I2510 Atherosclerotic heart disease of native coronary artery without angina pectoris: Secondary | ICD-10-CM | POA: Diagnosis present

## 2018-11-13 DIAGNOSIS — K219 Gastro-esophageal reflux disease without esophagitis: Secondary | ICD-10-CM | POA: Diagnosis present

## 2018-11-13 DIAGNOSIS — M170 Bilateral primary osteoarthritis of knee: Secondary | ICD-10-CM | POA: Diagnosis present

## 2018-11-13 DIAGNOSIS — E669 Obesity, unspecified: Secondary | ICD-10-CM | POA: Diagnosis present

## 2018-11-13 DIAGNOSIS — F329 Major depressive disorder, single episode, unspecified: Secondary | ICD-10-CM | POA: Diagnosis present

## 2018-11-13 DIAGNOSIS — E876 Hypokalemia: Secondary | ICD-10-CM | POA: Diagnosis present

## 2018-11-13 DIAGNOSIS — F419 Anxiety disorder, unspecified: Secondary | ICD-10-CM | POA: Diagnosis present

## 2018-11-13 DIAGNOSIS — I10 Essential (primary) hypertension: Secondary | ICD-10-CM | POA: Diagnosis present

## 2018-11-13 DIAGNOSIS — Z6835 Body mass index (BMI) 35.0-35.9, adult: Secondary | ICD-10-CM | POA: Diagnosis not present

## 2018-11-13 DIAGNOSIS — R1084 Generalized abdominal pain: Secondary | ICD-10-CM | POA: Diagnosis present

## 2018-11-13 DIAGNOSIS — R112 Nausea with vomiting, unspecified: Secondary | ICD-10-CM | POA: Diagnosis not present

## 2018-11-13 DIAGNOSIS — R1110 Vomiting, unspecified: Secondary | ICD-10-CM | POA: Diagnosis present

## 2018-11-13 DIAGNOSIS — E785 Hyperlipidemia, unspecified: Secondary | ICD-10-CM | POA: Diagnosis present

## 2018-11-13 DIAGNOSIS — M545 Low back pain: Secondary | ICD-10-CM | POA: Diagnosis present

## 2018-11-13 DIAGNOSIS — R197 Diarrhea, unspecified: Secondary | ICD-10-CM | POA: Diagnosis not present

## 2018-11-13 DIAGNOSIS — Z79899 Other long term (current) drug therapy: Secondary | ICD-10-CM | POA: Diagnosis not present

## 2018-11-13 DIAGNOSIS — G8929 Other chronic pain: Secondary | ICD-10-CM | POA: Diagnosis present

## 2018-11-13 DIAGNOSIS — C9000 Multiple myeloma not having achieved remission: Secondary | ICD-10-CM | POA: Diagnosis present

## 2018-11-13 DIAGNOSIS — N179 Acute kidney failure, unspecified: Secondary | ICD-10-CM | POA: Diagnosis present

## 2018-11-13 DIAGNOSIS — Z8249 Family history of ischemic heart disease and other diseases of the circulatory system: Secondary | ICD-10-CM | POA: Diagnosis not present

## 2018-11-13 DIAGNOSIS — K529 Noninfective gastroenteritis and colitis, unspecified: Secondary | ICD-10-CM | POA: Diagnosis present

## 2018-11-13 DIAGNOSIS — Z20828 Contact with and (suspected) exposure to other viral communicable diseases: Secondary | ICD-10-CM | POA: Diagnosis present

## 2018-11-13 DIAGNOSIS — G4700 Insomnia, unspecified: Secondary | ICD-10-CM | POA: Diagnosis present

## 2018-11-13 DIAGNOSIS — Z888 Allergy status to other drugs, medicaments and biological substances status: Secondary | ICD-10-CM | POA: Diagnosis not present

## 2018-11-13 DIAGNOSIS — Z87891 Personal history of nicotine dependence: Secondary | ICD-10-CM | POA: Diagnosis not present

## 2018-11-13 DIAGNOSIS — E860 Dehydration: Secondary | ICD-10-CM | POA: Diagnosis present

## 2018-11-13 DIAGNOSIS — Z885 Allergy status to narcotic agent status: Secondary | ICD-10-CM | POA: Diagnosis not present

## 2018-11-13 LAB — CBC
HCT: 40.5 % (ref 36.0–46.0)
Hemoglobin: 12.9 g/dL (ref 12.0–15.0)
MCH: 28.7 pg (ref 26.0–34.0)
MCHC: 31.9 g/dL (ref 30.0–36.0)
MCV: 90 fL (ref 80.0–100.0)
Platelets: 200 10*3/uL (ref 150–400)
RBC: 4.5 MIL/uL (ref 3.87–5.11)
RDW: 15.2 % (ref 11.5–15.5)
WBC: 10.1 10*3/uL (ref 4.0–10.5)
nRBC: 0 % (ref 0.0–0.2)

## 2018-11-13 LAB — BASIC METABOLIC PANEL
Anion gap: 8 (ref 5–15)
BUN: 9 mg/dL (ref 6–20)
CO2: 25 mmol/L (ref 22–32)
Calcium: 7.7 mg/dL — ABNORMAL LOW (ref 8.9–10.3)
Chloride: 103 mmol/L (ref 98–111)
Creatinine, Ser: 1.1 mg/dL — ABNORMAL HIGH (ref 0.44–1.00)
GFR calc Af Amer: >60 mL/min (ref >60)
GFR calc non Af Amer: 55 mL/min — ABNORMAL LOW (ref >60)
Glucose, Bld: 118 mg/dL — ABNORMAL HIGH (ref 70–99)
Potassium: 3.8 mmol/L (ref 3.5–5.1)
Sodium: 136 mmol/L (ref 135–145)

## 2018-11-13 LAB — MAGNESIUM: Magnesium: 2.4 mg/dL (ref 1.7–2.4)

## 2018-11-13 MED ORDER — LACTATED RINGERS IV SOLN
INTRAVENOUS | Status: AC
  Administered 2018-11-13 – 2018-11-14 (×2): via INTRAVENOUS

## 2018-11-13 MED ORDER — ALUM & MAG HYDROXIDE-SIMETH 200-200-20 MG/5ML PO SUSP: 30.0000 mL | Freq: Once | ORAL | Status: DC

## 2018-11-13 MED ORDER — LIDOCAINE VISCOUS HCL 2 % MT SOLN: 15.0000 mL | Freq: Once | OROMUCOSAL | Status: DC

## 2018-11-13 MED ORDER — CAPSAICIN 0.025 % EX CREA
TOPICAL_CREAM | Freq: Two times a day (BID) | CUTANEOUS | Status: DC
  Administered 2018-11-13 – 2018-11-14 (×3): via TOPICAL
  Filled 2018-11-13: qty 60

## 2018-11-13 MED ORDER — ALUM & MAG HYDROXIDE-SIMETH 200-200-20 MG/5ML PO SUSP
30.0000 mL | Freq: Four times a day (QID) | ORAL | Status: DC | PRN
  Administered 2018-11-13: 30 mL via ORAL
  Filled 2018-11-13: qty 30

## 2018-11-13 MED ORDER — LIDOCAINE VISCOUS HCL 2 % MT SOLN
15.0000 mL | Freq: Four times a day (QID) | OROMUCOSAL | Status: DC | PRN
  Administered 2018-11-13: 15 mL via ORAL
  Filled 2018-11-13: qty 15

## 2018-11-13 MED ORDER — TRAMADOL HCL 50 MG PO TABS
50.0000 mg | ORAL_TABLET | Freq: Two times a day (BID) | ORAL | Status: DC | PRN
  Administered 2018-11-13 – 2018-11-14 (×2): 50 mg via ORAL
  Filled 2018-11-13 (×2): qty 1

## 2018-11-13 NOTE — Progress Notes (Addendum)
Family Medicine Teaching Service Daily Progress Note Intern Pager: [phone]  Patient name: Shannon Stuart Medical record number: [id number] Date of birth: 01/26/1961 Age: 58 y.o. Gender: female  Primary Care Provider: Kathrene Alu, MD Consultants: None Code Status: Full  Pt Overview and Major Events to Date:  8/19-patient admitted  Assessment and Plan: Shannon Stuart is a 58 y.o. female presenting with abdomial pain and decreased PO intake 2/2 to nausea/emesis and diffuse abdominal pain. PMH is significant for possible multiple myeloma, HTN, HLD, GERD, CAD, chronic low back pain, and anxiety.  Acute gastroenteritis Nausea, emesis, diarrhea for the last 3 days.  Patient has 25 mg of Phenergan at home after being discharged from the ED 8/18 but has been unable to keep anything down by mouth.  WBC-16.1 on admission, hemoglobin 15.2 on admission.  Repeat CBC 8/20 showed downtrending CBC of 13.0.  Patient may be hemoconcentrated secondary to decreased volume from diarrhea.  Patient's potassium was repleted and was 3.8 (8/21).   - IV Phenergan - Hold IV fluids the fluids at this time -P.o. challenge for lunch -If unable to keep fluids down at lunch will make n.p.o. and restart IV fluids -Vitals per floor protocol -Capsaicin cream for stomach pain   Lytic lesions likely due to multiple Myeloma  Patient with new CT findings of lytic bone lesions. Patient previously worked up for mulitple myeloma now with lytic lesions found on CT of patient spine.   -patient previousy scheduled for outpatient follow up   Hypokalemia: 3.0 -will replete in mIVFs with 45mq @ rate of 1247mhr for 12 hours -A.m. BMP  Elevated creatinine, Questionable AKI, 1.01 Patient with previously elevated creatinine levels greater than 1 since 2018 with no diagnosis of CKD.  GFR greater than 60 upon admission.  It is possible that this elevated creatinine is due to to prerenal decrease in the setting of prolonged  vomiting and diarrhea.   -Will monitor with BMP -Continue IV fluids  HTN:  BP elevated at 157/108 Home meds include amlodipine 10 mg daily and senna Prill 40 mg daily. -We will continue p.o. amlodipine and lisinopril once patient is tolerating p.o.  Anxiety: Continue alprazolam 0.5 twice daily  Chronic back pain: Continue Voltaren gel as needed  Gastroesophageal reflux disease: Patient on 40 mg Protonix at home. -Continue Protonix 40 mg daily  FEN/GI: Liquid diet PPx: Lovenox 40 mg  Disposition: Home pending p.o. challenge  Subjective:  Patient reports she still unable to keep anything down.  This morning she has been to be discharged but cannot keep her breakfast fluids down.  She spoke with her sister and her sister said she would not come pick her up until she is able to keep things down by mouth.  She reports she could not sleep last night  Objective: Temp:  [99.1 F (37.3 C)-99.8 F (37.7 C)] 99.1 F (37.3 C) (08/21 0626) Pulse Rate:  [75-91] 84 (08/21 0626) Resp:  [16-17] 16 (08/21 0626) BP: (145-154)/(91-102) 145/92 (08/21 0626) SpO2:  [97 %-100 %] 98 % (08/21 0626) General: Lying under covers. Cardio: Normal S1 and S2, no S3 or S4. Rhythm is regular. No murmurs or rubs.   Pulm: Clear to auscultation bilaterally, no crackles, wheezing, or diminished breath sounds. Normal respiratory effort Abdomen: Positive bowel sounds, abdomen is soft but diffusely tender Extremities: No peripheral edema. Warm/ well perfused.  Neuro: Cranial nerves grossly intact  Laboratory: Recent Labs  Lab 11/11/18 1350 11/12/18 0537 11/13/18 0338  WBC 16.1* 13.0*  10.1  HGB 15.2* 14.0 12.9  HCT 47.2* 42.8 40.5  PLT 283 243 200   Recent Labs  Lab 11/10/18 1225 11/11/18 1350 11/12/18 0537 11/12/18 1504 11/13/18 0338  NA 141 138 137  --  136  K 3.9 3.0* 3.0* 3.0* 3.8  CL 107 98 100  --  103  CO2 21* 25 25  --  25  BUN _0 --  9  CREATININE 1.14* 1.01* 1.02*  --   1.10*  CALCIUM 9.0 9.0 8.1*  --  7.7*  PROT 6.2* 6.9  --   --   --   BILITOT 0.5 1.2  --   --   --   ALKPHOS 76 70  --   --   --   ALT 24 33  --   --   --   AST 26 39  --   --   --   GLUCOSE 147* 146* 128*  --  118*   Results for Shannon Stuart, Shannon A "[name]" (MRN [id number]) as of 11/12/2018 14:12  Ref. Range 11/11/2018 19:50  Amphetamines Latest Ref Range: NONE DETECTED  NONE DETECTED  Barbiturates Latest Ref Range: NONE DETECTED  NONE DETECTED  Benzodiazepines Latest Ref Range: NONE DETECTED  NONE DETECTED  Opiates Latest Ref Range: NONE DETECTED  NONE DETECTED  COCAINE Latest Ref Range: NONE DETECTED  NONE DETECTED  Tetrahydrocannabinol Latest Ref Range: NONE DETECTED  POSITIVE (A)    Imaging/Diagnostic Tests: Ct Abdomen Pelvis W Contrast  Result Date: 11/10/2018 CLINICAL DATA:  Abdominal pain, nausea and vomiting. EXAM: CT ABDOMEN AND PELVIS WITH CONTRAST TECHNIQUE: Multidetector CT imaging of the abdomen and pelvis was performed using the standard protocol following bolus administration of intravenous contrast. CONTRAST:  131m OMNIPAQUE IOHEXOL 300 MG/ML  SOLN COMPARISON:  February 20, 2017 FINDINGS: Lower chest: No acute abnormality. Hepatobiliary: Hepatic steatosis. Post cholecystectomy. No complicating features. Pancreas: Unremarkable. No pancreatic ductal dilatation or surrounding inflammatory changes. Spleen: Normal in size without focal abnormality. Adrenals/Urinary Tract: Adrenal glands are unremarkable. Kidneys are without renal calculi or hydronephrosis. Bilateral too small to be actually characterize hypoattenuated nodules seen. Bladder is unremarkable. Stomach/Bowel: Stomach is within normal limits. Appendix appears normal. No evidence of bowel wall thickening, distention, or inflammatory changes. Vascular/Lymphatic: No significant vascular findings are present. No enlarged abdominal or pelvic lymph nodes. Reproductive: Uterus and bilateral adnexa are unremarkable. Other: No abdominal  wall hernia or abnormality. No abdominopelvic ascites. Musculoskeletal: Innumerable small, few mm, lytic lesions throughout the vertebral bodies and pelvic bones. Osteoarthritic changes with sclerosis and subchondral cysts of the left hip. Spondylosis of the lumbosacral spine. IMPRESSION: 1. Hepatic steatosis. 2. No evidence of acute abnormalities within the solid abdominal organs. 3. Innumerable small, few mm, lytic lesions throughout the vertebral bodies and pelvic bones. Differential diagnosis includes multiple myeloma, metastatic disease or bone marrow abnormality. 4. Erosive versus postsurgical changes of the spinous process of L5, appear not significantly changed from November 2018. Electronically Signed   By: DFidela SalisburyM.D.   On: 11/10/2018 17:16   Dg Abdomen Acute W/chest  Result Date: 11/12/2018 CLINICAL DATA:  Upper abdominal pain for awhile, vomiting since yesterday, history hypertension, coronary artery disease, former smoker, GERD, endometriosis EXAM: DG ABDOMEN ACUTE W/ 1V CHEST COMPARISON:  Chest radiograph 02/21/2017, CT abdomen and pelvis 11/10/2018 FINDINGS: Normal heart size, mediastinal contours, and pulmonary vascularity. Lungs clear. No pulmonary infiltrate, pleural effusion or pneumothorax. Surgical clips RIGHT upper quadrant due to cholecystectomy. Normal bowel gas pattern. No bowel  dilatation, bowel wall thickening, or free air. No urinary tract calcifications. Diffuse osteosclerosis with degenerative changes of the lower lumbar spine and LEFT hip. IMPRESSION: No acute cardiopulmonary abnormalities. Nonobstructive bowel gas pattern. Diffuse osseous sclerosis; this is nonspecific and could be related to metabolic bone disease, renal osteodystrophy, and multiple other etiologies including metastatic disease, mastocytosis, and myelofibrosis. Electronically Signed   By: Lavonia Dana M.D.   On: 11/12/2018 08:41     Gifford Shave, MD 11/13/2018, 11:58 AM PGY-1, [place] Intern pager: [phone], text pages welcome

## 2018-11-13 NOTE — Telephone Encounter (Signed)
Pt lm on nurse line requesting to be changed to Dr. Andria Frames.  She did not give a reason  Pt is currently in the hospital so did not reach back out.  Will forward to message to PCP and Medical Director. Christen Bame, CMA

## 2018-11-13 NOTE — Telephone Encounter (Signed)
Please call to let her know that we don't switch from a resident to a faculty and vice versa. Thanks.

## 2018-11-13 NOTE — Plan of Care (Signed)

## 2018-11-13 NOTE — Progress Notes (Signed)
   11/12/18 2030  MEWS Score  Level of Consciousness Alert  MEWS Score  MEWS RR 0  MEWS Pulse 0  MEWS Systolic 0  MEWS LOC 0  MEWS Temp 0  MEWS Score 0  MEWS Score Color Green  MEWS Assessment  Is this an acute change? No  MEWS Guidelines - (patients age 60 and over)  Red - At High Risk for Deterioration Yellow - At risk for Deterioration  1. Go to room and assess patient 2. Validate data. Is this patient's baseline? If data confirmed: 3. Is this an acute change? 4. Administer prn meds/treatments as ordered. 5. Note Sepsis score 6. Review goals of care 7. Sports coach, RRT nurse and Provider. 8. Ask Provider to come to bedside.  9. Document patient condition/interventions/response. 10. Increase frequency of vital signs and focused assessments to at least q15 minutes x 4, then q30 minutes x2. - If stable, then q1h x3, then q4h x3 and then q8h or dept. routine. - If unstable, contact Provider & RRT nurse. Prepare for possible transfer. 11. Add entry in progress notes using the smart phrase ".MEWS". 1. Go to room and assess patient 2. Validate data. Is this patient's baseline? If data confirmed: 3. Is this an acute change? 4. Administer prn meds/treatments as ordered? 5. Note Sepsis score 6. Review goals of care 7. Sports coach and Provider 8. Call RRT nurse as needed. 9. Document patient condition/interventions/response. 10. Increase frequency of vital signs and focused assessments to at least q2h x2. - If stable, then q4h x2 and then q8h or dept. routine. - If unstable, contact Provider & RRT nurse. Prepare for possible transfer. 11. Add entry in progress notes using the smart phrase ".MEWS".  Green - Likely stable [place] - Comfort Care Only  1. Continue routine/ordered monitoring.  2. Review goals of care. 1. Continue routine/ordered monitoring. 2. Review goals of care.

## 2018-11-14 MED ORDER — CAPSAICIN 0.025 % EX CREA: TOPICAL_CREAM | Freq: Two times a day (BID) | CUTANEOUS | 0 refills | Status: DC

## 2018-11-14 MED ORDER — TRAMADOL HCL 50 MG PO TABS: 50.0000 mg | ORAL_TABLET | Freq: Every day | ORAL | 0 refills | Status: DC | PRN

## 2018-11-14 NOTE — Discharge Instructions (Signed)
Thank you for allowing [place] to participate in your care!     You were admitted for unrelenting nausea and vomiting and poor oral intake.  You were given fluids, IV Phenergan, and your electrolytes were repleted.  I am working with the transitions of care pharmacy to get you to suppository Phenergan for discharge.  I want you to continue increasing your oral intake.  Please continue to drink plenty of fluids.  You have an appointment with oncology on Wednesday 8/26, an appointment with GI on 8/27, and an appointment with our clinic for hospital follow-up with Dr. Garlan Fillers at 2:30 PM on Friday 8/28.  If you experience worsening of your admission symptoms, develop shortness of breath, life threatening emergency, suicidal or homicidal thoughts you must seek medical attention immediately by calling 911 or calling your MD immediately  if symptoms less severe.     Nausea and Vomiting, Adult Nausea is the feeling that you have an upset stomach or that you are about to vomit. Vomiting is when stomach contents are thrown up and out of the mouth as a result of nausea. Vomiting can make you feel weak and cause you to become dehydrated. Dehydration can make you feel tired and thirsty, cause you to have a dry mouth, and decrease how often you urinate. Older adults and people with other diseases or a weak disease-fighting system (immune system) are at higher risk for dehydration. It is important to treat your nausea and vomiting as told by your health care provider. Follow these instructions at home: Watch your symptoms for any changes. Tell your health care provider about them. Follow these instructions to care for yourself at home. Eating and drinking      Take an oral rehydration solution (ORS). This is a drink that is sold at pharmacies and retail stores.  Drink clear fluids slowly and in small amounts as you are able. Clear fluids include water, ice chips, low-calorie sports drinks, and fruit juice that  has water added (diluted fruit juice).  Eat bland, easy-to-digest foods in small amounts as you are able. These foods include bananas, applesauce, rice, lean meats, toast, and crackers.  Avoid fluids that contain a lot of sugar or caffeine, such as energy drinks, sports drinks, and soda.  Avoid alcohol.  Avoid spicy or fatty foods. General instructions  Take over-the-counter and prescription medicines only as told by your health care provider.  Drink enough fluid to keep your urine pale yellow.  Wash your hands often using soap and water. If soap and water are not available, use hand sanitizer.  Make sure that all people in your household wash their hands well and often.  Rest at home while you recover.  Watch your condition for any changes.  Breathe slowly and deeply when you feel nauseated.  Keep all follow-up visits as told by your health care provider. This is important. Contact a health care provider if:  Your symptoms get worse.  You have new symptoms.  You have a fever.  You cannot drink fluids without vomiting.  Your nausea does not go away after 2 days.  You feel light-headed or dizzy.  You have a headache.  You have muscle cramps.  You have a rash.  You have pain while urinating. Get help right away if:  You have pain in your chest, neck, arm, or jaw.  You feel extremely weak or you faint.  You have persistent vomiting.  You have vomit that is bright red or looks like black  coffee grounds.  You have bloody or black stools or stools that look like tar.  You have a severe headache, a stiff neck, or both.  You have severe pain, cramping, or bloating in your abdomen.  You have difficulty breathing, or you are breathing very quickly.  Your heart is beating very quickly.  Your skin feels cold and clammy.  You feel confused.  You have signs of dehydration, such as: ? Dark urine, very little urine, or no urine. ? Cracked lips. ? Dry  mouth. ? Sunken eyes. ? Sleepiness. ? Weakness. These symptoms may represent a serious problem that is an emergency. Do not wait to see if the symptoms will go away. Get medical help right away. Call your local emergency services (911 in the [place] Do not drive yourself to the hospital. Summary  Nausea is the feeling that you have an upset stomach or that you are about to vomit. As nausea gets worse, it can lead to vomiting. Vomiting can make you feel weak and cause you to become dehydrated.  Follow instructions from your health care provider about eating and drinking to prevent dehydration.  Take over-the-counter and prescription medicines only as told by your health care provider.  Contact your health care provider if your symptoms get worse, or you have new symptoms.  Keep all follow-up visits as told by your health care provider. This is important. This information is not intended to replace advice given to you by your health care provider. Make sure you discuss any questions you have with your health care provider. Document Released: 03/11/2005 Document Revised: 07/03/2018 Document Reviewed: 08/19/2017 Elsevier Patient Education  2020 [employer].

## 2018-11-14 NOTE — Plan of Care (Signed)

## 2018-11-16 NOTE — Telephone Encounter (Signed)
Attempted to call, no answer and no machine. Christen Bame, CMA
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
## 2018-11-18 ENCOUNTER — Other Ambulatory Visit: Payer: Self-pay

## 2018-11-18 ENCOUNTER — Inpatient Hospital Stay: Payer: Medicare HMO

## 2018-11-18 ENCOUNTER — Inpatient Hospital Stay: Payer: Medicare HMO | Attending: Hematology | Admitting: Hematology

## 2018-11-18 ENCOUNTER — Telehealth: Payer: Self-pay | Admitting: Hematology

## 2018-11-18 VITALS — BP 131/90 | HR 78 | Temp 97.8°F | Resp 18 | Ht 68.0 in | Wt 210.7 lb

## 2018-11-18 DIAGNOSIS — K219 Gastro-esophageal reflux disease without esophagitis: Secondary | ICD-10-CM | POA: Diagnosis not present

## 2018-11-18 DIAGNOSIS — C7951 Secondary malignant neoplasm of bone: Secondary | ICD-10-CM | POA: Diagnosis not present

## 2018-11-18 DIAGNOSIS — G8929 Other chronic pain: Secondary | ICD-10-CM | POA: Diagnosis not present

## 2018-11-18 DIAGNOSIS — D8989 Other specified disorders involving the immune mechanism, not elsewhere classified: Secondary | ICD-10-CM | POA: Diagnosis not present

## 2018-11-18 DIAGNOSIS — M545 Low back pain: Secondary | ICD-10-CM | POA: Diagnosis not present

## 2018-11-18 DIAGNOSIS — M899 Disorder of bone, unspecified: Secondary | ICD-10-CM | POA: Diagnosis not present

## 2018-11-18 DIAGNOSIS — Z87891 Personal history of nicotine dependence: Secondary | ICD-10-CM | POA: Insufficient documentation

## 2018-11-18 DIAGNOSIS — K760 Fatty (change of) liver, not elsewhere classified: Secondary | ICD-10-CM | POA: Insufficient documentation

## 2018-11-18 DIAGNOSIS — F1290 Cannabis use, unspecified, uncomplicated: Secondary | ICD-10-CM | POA: Insufficient documentation

## 2018-11-18 LAB — CBC WITH DIFFERENTIAL/PLATELET
Abs Immature Granulocytes: 0.03 10*3/uL (ref 0.00–0.07)
Basophils Absolute: 0.1 10*3/uL (ref 0.0–0.1)
Basophils Relative: 1 %
Eosinophils Absolute: 0.2 10*3/uL (ref 0.0–0.5)
Eosinophils Relative: 3 %
HCT: 40.9 % (ref 36.0–46.0)
Hemoglobin: 12.9 g/dL (ref 12.0–15.0)
Immature Granulocytes: 0 %
Lymphocytes Relative: 43 %
Lymphs Abs: 3.1 10*3/uL (ref 0.7–4.0)
MCH: 28.4 pg (ref 26.0–34.0)
MCHC: 31.5 g/dL (ref 30.0–36.0)
MCV: 89.9 fL (ref 80.0–100.0)
Monocytes Absolute: 0.6 10*3/uL (ref 0.1–1.0)
Monocytes Relative: 8 %
Neutro Abs: 3.3 10*3/uL (ref 1.7–7.7)
Neutrophils Relative %: 45 %
Platelets: 227 10*3/uL (ref 150–400)
RBC: 4.55 MIL/uL (ref 3.87–5.11)
RDW: 15.7 % — ABNORMAL HIGH (ref 11.5–15.5)
WBC: 7.3 10*3/uL (ref 4.0–10.5)
nRBC: 0 % (ref 0.0–0.2)

## 2018-11-18 LAB — CMP (CANCER CENTER ONLY)
ALT: 51 U/L — ABNORMAL HIGH (ref 0–44)
AST: 40 U/L (ref 15–41)
Albumin: 3.7 g/dL (ref 3.5–5.0)
Alkaline Phosphatase: 112 U/L (ref 38–126)
Anion gap: 9 (ref 5–15)
BUN: 23 mg/dL — ABNORMAL HIGH (ref 6–20)
CO2: 25 mmol/L (ref 22–32)
Calcium: 9.1 mg/dL (ref 8.9–10.3)
Chloride: 108 mmol/L (ref 98–111)
Creatinine: 1.07 mg/dL — ABNORMAL HIGH (ref 0.44–1.00)
GFR, Est AFR Am: >60 mL/min (ref >60)
GFR, Estimated: 56 mL/min — ABNORMAL LOW (ref >60)
Glucose, Bld: 103 mg/dL — ABNORMAL HIGH (ref 70–99)
Potassium: 4.5 mmol/L (ref 3.5–5.1)
Sodium: 142 mmol/L (ref 135–145)
Total Bilirubin: 0.3 mg/dL (ref 0.3–1.2)
Total Protein: 6.2 g/dL — ABNORMAL LOW (ref 6.5–8.1)

## 2018-11-18 LAB — LACTATE DEHYDROGENASE: LDH: 269 U/L — ABNORMAL HIGH (ref 98–192)

## 2018-11-18 LAB — SEDIMENTATION RATE: Sed Rate: 6 mm/hr (ref 0–22)

## 2018-11-18 NOTE — Telephone Encounter (Signed)
Scheduled appt per 8/26 los.  Printed calendar and avs,

## 2018-11-19 LAB — MULTIPLE MYELOMA PANEL, SERUM
Albumin SerPl Elph-Mcnc: 3.3 g/dL (ref 2.9–4.4)
Albumin/Glob SerPl: 1.6 (ref 0.7–1.7)
Alpha 1: 0.2 g/dL (ref 0.0–0.4)
Alpha2 Glob SerPl Elph-Mcnc: 0.7 g/dL (ref 0.4–1.0)
B-Globulin SerPl Elph-Mcnc: 0.8 g/dL (ref 0.7–1.3)
Gamma Glob SerPl Elph-Mcnc: 0.4 g/dL (ref 0.4–1.8)
Globulin, Total: 2.1 g/dL — ABNORMAL LOW (ref 2.2–3.9)
IgA: 103 mg/dL (ref 87–352)
IgG (Immunoglobin G), Serum: 545 mg/dL — ABNORMAL LOW (ref 586–1602)
IgM (Immunoglobulin M), Srm: 39 mg/dL (ref 26–217)
Total Protein ELP: 5.4 g/dL — ABNORMAL LOW (ref 6.0–8.5)

## 2018-11-19 LAB — KAPPA/LAMBDA LIGHT CHAINS
Kappa free light chain: 22.2 mg/L — ABNORMAL HIGH (ref 3.3–19.4)
Kappa, lambda light chain ratio: 1.38 (ref 0.26–1.65)
Lambda free light chains: 16.1 mg/L (ref 5.7–26.3)

## 2018-11-19 LAB — VITAMIN D 25 HYDROXY (VIT D DEFICIENCY, FRACTURES): Vit D, 25-Hydroxy: 12.8 ng/mL — ABNORMAL LOW (ref 30.0–100.0)

## 2018-11-19 LAB — ANGIOTENSIN CONVERTING ENZYME: Angiotensin-Converting Enzyme: 8 U/L — ABNORMAL LOW (ref 14–82)

## 2018-11-20 ENCOUNTER — Inpatient Hospital Stay: Payer: Medicare HMO | Admitting: Family Medicine

## 2018-11-20 LAB — CALCIUM, IONIZED: Calcium, Ionized, Serum: 5.1 mg/dL (ref 4.5–5.6)

## 2018-12-01 ENCOUNTER — Inpatient Hospital Stay: Payer: Medicare HMO | Attending: Hematology

## 2018-12-01 ENCOUNTER — Telehealth: Payer: Self-pay | Admitting: Licensed Clinical Social Worker

## 2018-12-01 DIAGNOSIS — D8989 Other specified disorders involving the immune mechanism, not elsewhere classified: Secondary | ICD-10-CM | POA: Diagnosis present

## 2018-12-01 DIAGNOSIS — C7951 Secondary malignant neoplasm of bone: Secondary | ICD-10-CM

## 2018-12-01 NOTE — Telephone Encounter (Signed)
Care Coordination Social Work Note  12/01/2018 Name: Shannon Stuart MRN: [id number] DOB: 03/04/1961  Shannon Stuart is a 58 y.o. year old female who sees Winfrey, Alcario Drought, MD for primary care. LCSW was consulted for assistance with connecting patient to counseling resource. F/U call to patient to assess needs and barriers reference the above referral Assessment : Patient has been in the hospital and experiencing health challenges. She has not been able to connect to a provider and is thinking of putting her mental health concerns on hold. Recommendation: Patient may benefit and is now in agreement to connect to a therapist while managing her health concerns Goals: connect to a therapist Interventions:Provided patient with information about [facility] Ayesha Rumpf [id number]) Other interventions: . Emotional support,   . Patient education    Plan: SW will follow up with patient by phone over the next 2 weeks  [name], [place] / [place]   [phone] 11:24 AM

## 2018-12-02 ENCOUNTER — Encounter: Payer: Self-pay | Admitting: *Deleted

## 2018-12-02 ENCOUNTER — Encounter (HOSPITAL_COMMUNITY): Payer: Medicare HMO

## 2018-12-02 LAB — UPEP/UIFE/LIGHT CHAINS/TP, 24-HR UR
% BETA, Urine: 0 %
ALPHA 1 URINE: 0 %
Albumin, U: 0 %
Alpha 2, Urine: 0 %
Free Kappa Lt Chains,Ur: 24.73 mg/L (ref 0.63–113.79)
Free Kappa/Lambda Ratio: 18.73 (ref 1.03–31.76)
Free Lambda Lt Chains,Ur: 1.32 mg/L (ref 0.47–11.77)
GAMMA GLOBULIN URINE: 0 %
Total Protein, Urine-Ur/day: <76 mg/24 hr (ref 30–150)
Total Protein, Urine: <4 mg/dL
Total Volume: 1900

## 2018-12-03 ENCOUNTER — Ambulatory Visit: Payer: Self-pay | Admitting: Gastroenterology

## 2018-12-04 ENCOUNTER — Telehealth: Payer: Self-pay

## 2018-12-04 ENCOUNTER — Other Ambulatory Visit: Payer: Self-pay | Admitting: Family Medicine

## 2018-12-04 ENCOUNTER — Inpatient Hospital Stay: Payer: Medicare HMO | Admitting: Hematology

## 2018-12-04 ENCOUNTER — Other Ambulatory Visit: Payer: Self-pay

## 2018-12-04 DIAGNOSIS — M545 Low back pain, unspecified: Secondary | ICD-10-CM

## 2018-12-04 DIAGNOSIS — G8929 Other chronic pain: Secondary | ICD-10-CM

## 2018-12-04 MED ORDER — ERGOCALCIFEROL 1.25 MG (50000 UT) PO CAPS: 50000.0000 [IU] | ORAL_CAPSULE | ORAL | 1 refills | Status: DC

## 2018-12-04 MED ORDER — CYCLOBENZAPRINE HCL 10 MG PO TABS: ORAL_TABLET | ORAL | 2 refills | Status: DC

## 2018-12-04 MED ORDER — TRAMADOL HCL 100 MG PO TABS: 100.0000 mg | ORAL_TABLET | Freq: Two times a day (BID) | ORAL | 0 refills | Status: DC | PRN

## 2018-12-04 NOTE — Telephone Encounter (Signed)
Left VM explaining that since patient receives pain medication at a pain management clinic and I have not seen her in over a year, I cannot prescribe stronger narcotics, but I will send tramadol 100 mg BID PRN (double her usual dose of tramadol) and send another prescription for Flexeril to get her to her appointment with me on 9/15.  We can then assess her pain and make a plan for how to address it.

## 2018-12-04 NOTE — Telephone Encounter (Signed)
Pt calling to get some pain medicine. She has a $45 co pay at pain management that she didn't know she had so she will have to wait until next month to go.Needs something for pain. Tramadol isnt working Ottis Stain, [place]

## 2018-12-07 ENCOUNTER — Other Ambulatory Visit: Payer: Self-pay

## 2018-12-07 ENCOUNTER — Telehealth: Payer: Self-pay | Admitting: Hematology

## 2018-12-07 ENCOUNTER — Other Ambulatory Visit: Payer: Self-pay | Admitting: Hematology

## 2018-12-07 ENCOUNTER — Encounter (HOSPITAL_COMMUNITY): Payer: Self-pay

## 2018-12-07 ENCOUNTER — Ambulatory Visit (HOSPITAL_COMMUNITY)
Admission: RE | Admit: 2018-12-07 | Discharge: 2018-12-07 | Disposition: A | Discharge status: Home / Self Care | Payer: Medicare HMO | Source: Ambulatory Visit | Attending: Hematology | Admitting: Hematology

## 2018-12-07 DIAGNOSIS — M5136 Other intervertebral disc degeneration, lumbar region: Secondary | ICD-10-CM | POA: Insufficient documentation

## 2018-12-07 DIAGNOSIS — I700 Atherosclerosis of aorta: Secondary | ICD-10-CM | POA: Diagnosis not present

## 2018-12-07 DIAGNOSIS — Z87891 Personal history of nicotine dependence: Secondary | ICD-10-CM | POA: Diagnosis not present

## 2018-12-07 DIAGNOSIS — K219 Gastro-esophageal reflux disease without esophagitis: Secondary | ICD-10-CM | POA: Diagnosis not present

## 2018-12-07 DIAGNOSIS — F419 Anxiety disorder, unspecified: Secondary | ICD-10-CM | POA: Insufficient documentation

## 2018-12-07 DIAGNOSIS — I10 Essential (primary) hypertension: Secondary | ICD-10-CM | POA: Insufficient documentation

## 2018-12-07 DIAGNOSIS — C7951 Secondary malignant neoplasm of bone: Secondary | ICD-10-CM

## 2018-12-07 DIAGNOSIS — Z79899 Other long term (current) drug therapy: Secondary | ICD-10-CM | POA: Insufficient documentation

## 2018-12-07 LAB — GLUCOSE, CAPILLARY: Glucose-Capillary: 108 mg/dL — ABNORMAL HIGH (ref 70–99)

## 2018-12-07 MED ORDER — FLUDEOXYGLUCOSE F - 18 (FDG) INJECTION
10.6000 | Freq: Once | INTRAVENOUS | Status: AC | PRN
  Administered 2018-12-07: 10.6 via INTRAVENOUS

## 2018-12-07 NOTE — Telephone Encounter (Signed)
No los per 9/11.

## 2018-12-08 ENCOUNTER — Other Ambulatory Visit: Payer: Self-pay

## 2018-12-08 ENCOUNTER — Encounter: Payer: Self-pay | Admitting: Family Medicine

## 2018-12-08 ENCOUNTER — Ambulatory Visit (INDEPENDENT_AMBULATORY_CARE_PROVIDER_SITE_OTHER): Payer: Medicare HMO | Admitting: Family Medicine

## 2018-12-08 VITALS — BP 112/80 | HR 83 | Ht 68.0 in | Wt 209.5 lb

## 2018-12-08 DIAGNOSIS — G8929 Other chronic pain: Secondary | ICD-10-CM

## 2018-12-08 DIAGNOSIS — D8989 Other specified disorders involving the immune mechanism, not elsewhere classified: Secondary | ICD-10-CM | POA: Diagnosis not present

## 2018-12-08 DIAGNOSIS — M5440 Lumbago with sciatica, unspecified side: Secondary | ICD-10-CM | POA: Diagnosis not present

## 2018-12-08 DIAGNOSIS — R197 Diarrhea, unspecified: Secondary | ICD-10-CM

## 2018-12-08 DIAGNOSIS — R1115 Cyclical vomiting syndrome unrelated to migraine: Secondary | ICD-10-CM

## 2018-12-08 MED ORDER — MELOXICAM 7.5 MG PO TABS: 7.5000 mg | ORAL_TABLET | Freq: Every day | ORAL | 0 refills | Status: DC

## 2018-12-08 NOTE — Patient Instructions (Signed)
It was nice seeing you today Shannon Stuart!  I am sending a medication called meloxicam for your pain, which you should take once daily with food.  It is possible to go up on the dose of this medication, but we will start the lowest dose for now.  We want to make sure that your kidney function is good before we consider increasing the dose.  Please call me if you want refills of this medication.  You can also try lidocaine patches over-the-counter to help with your back pain.  We will check your kidney function and electrolytes today, and we will let you know if there are abnormal results.  Please look into the medication pregabalin for your nerve pain.  If you are interested, please let me know and I will prescribe it for you.  I hope you get some answers soon and that things settle down. If you have any questions or concerns, please feel free to call the clinic.   Be well,  Dr. Shan Levans

## 2018-12-09 LAB — BASIC METABOLIC PANEL
BUN/Creatinine Ratio: 20 (ref 12–28)
BUN: 21 mg/dL (ref 8–27)
CO2: 23 mmol/L (ref 20–29)
Calcium: 9.6 mg/dL (ref 8.7–10.3)
Chloride: 102 mmol/L (ref 96–106)
Creatinine, Ser: 1.04 mg/dL — ABNORMAL HIGH (ref 0.57–1.00)
GFR calc Af Amer: 67 mL/min/{1.73_m2} (ref >59)
GFR calc non Af Amer: 59 mL/min/{1.73_m2} — ABNORMAL LOW (ref >59)
Glucose: 110 mg/dL — ABNORMAL HIGH (ref 65–99)
Potassium: 4.6 mmol/L (ref 3.5–5.2)
Sodium: 139 mmol/L (ref 134–144)

## 2018-12-09 NOTE — Progress Notes (Signed)
Subjective:    Shannon Stuart - 58 y.o. female MRN [id number]  Date of birth: 1961-03-03  CC:  Shannon Stuart is here for follow-up of her back pain and recent hospitalization due to abdominal pain and nausea/vomiting.  HPI: Hospitalization follow-up Shannon Stuart reports that she has had some intermittent nausea but improved through hospital discharge, but it has been overall better controlled.  She also is following up with Dr. Irene Limbo regarding lytic areas on her bones concerning for metastases of an unknown malignancy seen on CT performed during her hospitalization.  She had a PET scan yesterday and is awaiting these results.  She is understandably very worried and stressed about this.  Back pain She has had an increase in her back pain that is worse when she stands from a seated position or stands for a prolonged period of time.  Her pain also travels down the backs of her legs and is described as sharp.  She has recently had a change in her insurance so that now she has to pay $45 every time she sees a specialist, so she has been unable to see her pain management clinic this month due to a schedule change and this new co-pay, however she does plan to see them next month.  She continues to use tramadol for her pain.  She has been offered gabapentin in the past, but she says that she has read about this medication and thinks that it is very unsafe, although she has never tried it in the past.  She has also read about Cymbalta and is not willing to try this medication either.  Health Maintenance: Patient plans to get a colonoscopy in the near future. Health Maintenance Due  Topic Date Due  . TETANUS/TDAP  11/24/2015  . COLONOSCOPY  08/12/2018    -  reports that she quit smoking about 27 years ago. Her smoking use included cigarettes. She has never used smokeless tobacco. - Review of Systems: Per HPI. - Past Medical History: Patient Active Problem List   Diagnosis Date Noted  . Emesis 11/13/2018  .  Generalized abdominal pain   . Intractable vomiting   . Diarrhea with dehydration 11/11/2018  . Leukocytosis   . Elevated serum creatinine 03/06/2017  . Skin lesion of right arm 01/02/2017  . Fingernail abnormalities 01/02/2017  . Rash 12/31/2016  . Changes in vision 09/20/2016  . Amplified musculoskeletal pain, diffuse 09/20/2016  . Trigger point of extremity 09/20/2016  . Chronic diarrhea 09/20/2016  . Chronic bilateral lower abdominal pain 09/20/2016  . 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 Hypokalemia due to excessive gastrointestinal loss of potassium 08/30/2016  . Bone lesion 08/30/2016  . Non-obstructive CAD   . Obesity, Class II, BMI 35-39.9   . Chronic low back pain 10/12/2013  . Well woman exam 09/02/2013  . Prediabetes 06/02/2013  . Trigger point of shoulder region 02/20/2013  . Insomnia 08/18/2012  . Arthritis of both knees 04/11/2011  . Lower extremity edema 09/24/2010  . Depression 08/10/2010  . HLD (hyperlipidemia) 08/04/2009  . Leiomyoma of uterus 04/29/2008  . Dysfunctional uterine bleeding 04/26/2008  . Anxiety with depression 09/15/2006  . RHINITIS, ALLERGIC NOS 09/08/2006  . GERD 09/08/2006  . HYPERTENSION, BENIGN ESSENTIAL 08/04/2006   - Medications: reviewed and updated   Objective:   Physical Exam BP 112/80   Pulse 83   Ht '5\' 8"'$  (1.727 m)   Wt 209 lb 8 oz (95 kg)   SpO2 96%   BMI  31.85 kg/m  Gen: NAD, alert, cooperative with exam CV: RRR, good S1/S2, no murmur Resp: CTABL, no wheezes, non-labored Abd: SNTND, BS present, no guarding or organomegaly Skin: Many seborrheic keratoses on patient's face, stable from previous visit Psych: Anxious affect and mood  Assessment & Plan:   Chronic low back pain I explained the to the patient that most pain management clinics will no longer accept her as a patient if I were to give her narcotic pain medication, but I did offer her meloxicam 7.5 mg daily with food, and patient would like to try  this option.  I also offered for her to look into pregabalin for her neuropathic pain and to let me know if she would like to try this medication.  However, since pregabalin is now a controlled substance, it may be better for her pain management clinic to prescribe this if she is interested.  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  Patient is following up with an hematology/oncology.  I will follow along with their findings as well.    Maia Breslow, M.D. 12/09/2018, 5:18 PM PGY-3, [place]

## 2018-12-09 NOTE — Assessment & Plan Note (Addendum)
I explained the to the patient that most pain management clinics will no longer accept her as a patient if I were to give her narcotic pain medication, but I did offer her meloxicam 7.5 mg daily with food, and patient would like to try this option.  I also offered for her to look into pregabalin for her neuropathic pain and to let me know if she would like to try this medication.  However, since pregabalin is now a controlled substance, it may be better for her pain management clinic to prescribe this if she is interested.

## 2018-12-09 NOTE — Assessment & Plan Note (Signed)
It is possible that her kappa light chain disease has become multiple myeloma or she has developed another blood cell cancer due to these newly visualized neck lesions.  Patient is following up with an hematology/oncology.  I will follow along with their findings as well.

## 2018-12-10 NOTE — Progress Notes (Signed)
This encounter was created in error - please disregard.

## 2018-12-17 ENCOUNTER — Telehealth: Payer: Self-pay | Admitting: Licensed Clinical Social Worker

## 2018-12-17 NOTE — Telephone Encounter (Signed)
   Care Coordination Follow up Phone Note Social Work   12/17/2018 Name: Shannon Stuart MRN: [id number] DOB: 1960/05/20  Shannon Stuart is a 58 y.o. year old female who sees Winfrey, Alcario Drought, MD for primary care.  Reason for follow-up: phone encounter with patient today to assess needs and barriers reference connecting for ongoing counseling and therapy.  Patient reports: she called resources previously discussed with LCSW but unable to get an appointment until October.   Interventions:discussed other therapy options with patient, reminded her to contact her insurance provider to obtain the list of in -network providers.  Other interventions: . Solution-Focused Strategies,   . Problem-solving teaching/coping strategies  Plan:  1. SW will follow up with patient by phone over the next week 2.   Patient will contact her insurance provider for list of providers 3.   Patient will call [facility] to schedule an appointment  [name], [place] / [place]   [phone] 10:26 AM

## 2018-12-25 NOTE — Telephone Encounter (Addendum)
   Care Coordination Follow up Phone Note Social Work   12/25/2018 Name: AARIANA CALLIS MRN: [id number] DOB: 1960/09/27  [name] is a 58 y.o. year old female who sees Winfrey, Alcario Drought, MD for primary care.  LCSW received voice message from patient that she has connected with [facility] [phone]  to establish for ongoing therapy.  Patient has an appointment Oct. 8th with Philip Aspen.  Patient very appreciative of assistance from LCSW. Plan:  1. Client will contact LCSW if needed. 2. No further follow up required: by LCSW   updated provided to Dr. Bedelia Person, [place] Worker [place] / [place]   [phone]

## 2019-01-28 ENCOUNTER — Other Ambulatory Visit: Payer: Self-pay | Admitting: Family Medicine

## 2019-01-28 ENCOUNTER — Telehealth: Payer: Self-pay

## 2019-01-28 MED ORDER — TRAMADOL HCL 50 MG PO TABS: 50.0000 mg | ORAL_TABLET | Freq: Three times a day (TID) | ORAL | 0 refills | Status: AC | PRN

## 2019-01-28 NOTE — Telephone Encounter (Signed)
I have sent in 1 month supply of tramadol 3 times daily as needed, and she and I can discuss whether I will manage her pain at her appointment with me.  Thanks.

## 2019-01-28 NOTE — Telephone Encounter (Signed)
Patient calls nurse line stating she can not afford to see a pain management physician. Patient stated it cost 45 dollars visit and on top of everything else this is not an option. Patient wants to know if you can manage her pain and be her sole pain prescriber. I scheduled her for your first available, 12/1. However, patient is wanting Tramadol in the meantime. Please advise.

## 2019-02-15 ENCOUNTER — Telehealth: Payer: Self-pay | Admitting: Gastroenterology

## 2019-02-15 NOTE — Telephone Encounter (Signed)
The pt has been advised that the recall has been changed due to current guidelines.  The pt has been advised of the information and verbalized understanding.

## 2019-02-23 ENCOUNTER — Encounter: Payer: Self-pay | Admitting: Physician Assistant

## 2019-02-23 ENCOUNTER — Encounter: Payer: Self-pay | Admitting: Family Medicine

## 2019-02-23 ENCOUNTER — Other Ambulatory Visit: Payer: Self-pay

## 2019-02-23 ENCOUNTER — Ambulatory Visit (INDEPENDENT_AMBULATORY_CARE_PROVIDER_SITE_OTHER): Payer: Medicare HMO | Admitting: Family Medicine

## 2019-02-23 VITALS — BP 108/74 | HR 83 | Wt 210.4 lb

## 2019-02-23 DIAGNOSIS — F418 Other specified anxiety disorders: Secondary | ICD-10-CM

## 2019-02-23 DIAGNOSIS — M170 Bilateral primary osteoarthritis of knee: Secondary | ICD-10-CM

## 2019-02-23 DIAGNOSIS — M25511 Pain in right shoulder: Secondary | ICD-10-CM | POA: Diagnosis not present

## 2019-02-23 DIAGNOSIS — G8929 Other chronic pain: Secondary | ICD-10-CM
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
## 2019-02-23 NOTE — Patient Instructions (Addendum)
It was nice seeing you today, [name]. I am placing a referral to sports medicine for possible right knee injection or right shoulder injection.  Please try the exercises I have included below, doing 3 sets of 10 repetitions per day if you can.  I am also placing a referral for physical therapy.  I will message our in clinic psychologist to see if we can get an appointment for you with her.  I will let you know what she says.  If you have any questions or concerns, please feel free to call the clinic.   Be well,  Dr. Shan Levans Knee Exercises Ask your health care provider which exercises are safe for you. Do exercises exactly as told by your health care provider and adjust them as directed. It is normal to feel mild stretching, pulling, tightness, or discomfort as you do these exercises. Stop right away if you feel sudden pain or your pain gets worse. Do not begin these exercises until told by your health care provider. Stretching and range-of-motion exercises These exercises warm up your muscles and joints and improve the movement and flexibility of your knee. These exercises also help to relieve pain and swelling. Knee extension, prone 1. Lie on your abdomen (prone position) on a bed. 2. Place your left / right knee just beyond the edge of the surface so your knee is not on the bed. You can put a towel under your left / right thigh just above your kneecap for comfort. 3. Relax your leg muscles and allow gravity to straighten your knee (extension). You should feel a stretch behind your left / right knee. 4. Hold this position for __________ seconds. 5. Scoot up so your knee is supported between repetitions. Repeat __________ times. Complete this exercise __________ times a day. Knee flexion, active  1. Lie on your back with both legs straight. If this causes back discomfort, bend your left / right knee so your foot is flat on the floor. 2. Slowly slide your left / right heel back toward your  buttocks. Stop when you feel a gentle stretch in the front of your knee or thigh (flexion). 3. Hold this position for __________ seconds. 4. Slowly slide your left / right heel back to the starting position. Repeat __________ times. Complete this exercise __________ times a day. Quadriceps stretch, prone  1. Lie on your abdomen on a firm surface, such as a bed or padded floor. 2. Bend your left / right knee and hold your ankle. If you cannot reach your ankle or pant leg, loop a belt around your foot and grab the belt instead. 3. Gently pull your heel toward your buttocks. Your knee should not slide out to the side. You should feel a stretch in the front of your thigh and knee (quadriceps). 4. Hold this position for __________ seconds. Repeat __________ times. Complete this exercise __________ times a day. Hamstring, supine 1. Lie on your back (supine position). 2. Loop a belt or towel over the ball of your left / right foot. The ball of your foot is on the walking surface, right under your toes. 3. Straighten your left / right knee and slowly pull on the belt to raise your leg until you feel a gentle stretch behind your knee (hamstring). ? Do not let your knee bend while you do this. ? Keep your other leg flat on the floor. 4. Hold this position for __________ seconds. Repeat __________ times. Complete this exercise __________ times a day. Strengthening  exercises These exercises build strength and endurance in your knee. Endurance is the ability to use your muscles for a long time, even after they get tired. Quadriceps, isometric This exercise stretches the muscles in front of your thigh (quadriceps) without moving your knee joint (isometric). 1. Lie on your back with your left / right leg extended and your other knee bent. Put a rolled towel or small pillow under your knee if told by your health care provider. 2. Slowly tense the muscles in the front of your left / right thigh. You should  see your kneecap slide up toward your hip or see increased dimpling just above the knee. This motion will push the back of the knee toward the floor. 3. For __________ seconds, hold the muscle as tight as you can without increasing your pain. 4. Relax the muscles slowly and completely. Repeat __________ times. Complete this exercise __________ times a day. Straight leg raises This exercise stretches the muscles in front of your thigh (quadriceps) and the muscles that move your hips (hip flexors). 1. Lie on your back with your left / right leg extended and your other knee bent. 2. Tense the muscles in the front of your left / right thigh. You should see your kneecap slide up or see increased dimpling just above the knee. Your thigh may even shake a bit. 3. Keep these muscles tight as you raise your leg 4-6 inches (10-15 cm) off the floor. Do not let your knee bend. 4. Hold this position for __________ seconds. 5. Keep these muscles tense as you lower your leg. 6. Relax your muscles slowly and completely after each repetition. Repeat __________ times. Complete this exercise __________ times a day. Hamstring, isometric 1. Lie on your back on a firm surface. 2. Bend your left / right knee about __________ degrees. 3. Dig your left / right heel into the surface as if you are trying to pull it toward your buttocks. Tighten the muscles in the back of your thighs (hamstring) to "dig" as hard as you can without increasing any pain. 4. Hold this position for __________ seconds. 5. Release the tension gradually and allow your muscles to relax completely for __________ seconds after each repetition. Repeat __________ times. Complete this exercise __________ times a day. Hamstring curls If told by your health care provider, do this exercise while wearing ankle weights. Begin with __________ lb weights. Then increase the weight by 1 lb (0.5 kg) increments. Do not wear ankle weights that are more than  __________ lb. 1. Lie on your abdomen with your legs straight. 2. Bend your left / right knee as far as you can without feeling pain. Keep your hips flat against the floor. 3. Hold this position for __________ seconds. 4. Slowly lower your leg to the starting position. Repeat __________ times. Complete this exercise __________ times a day. Squats This exercise strengthens the muscles in front of your thigh and knee (quadriceps). 1. Stand in front of a table, with your feet and knees pointing straight ahead. You may rest your hands on the table for balance but not for support. 2. Slowly bend your knees and lower your hips like you are going to sit in a chair. ? Keep your weight over your heels, not over your toes. ? Keep your lower legs upright so they are parallel with the table legs. ? Do not let your hips go lower than your knees. ? Do not bend lower than told by your health care provider. ?  If your knee pain increases, do not bend as low. 3. Hold the squat position for __________ seconds. 4. Slowly push with your legs to return to standing. Do not use your hands to pull yourself to standing. Repeat __________ times. Complete this exercise __________ times a day. Wall slides This exercise strengthens the muscles in front of your thigh and knee (quadriceps). 1. Lean your back against a smooth wall or door, and walk your feet out 18-24 inches (46-61 cm) from it. 2. Place your feet hip-width apart. 3. Slowly slide down the wall or door until your knees bend __________ degrees. Keep your knees over your heels, not over your toes. Keep your knees in line with your hips. 4. Hold this position for __________ seconds. Repeat __________ times. Complete this exercise __________ times a day. Straight leg raises This exercise strengthens the muscles that rotate the leg at the hip and move it away from your body (hip abductors). 1. Lie on your side with your left / right leg in the top position. Lie  so your head, shoulder, knee, and hip line up. You may bend your bottom knee to help you keep your balance. 2. Roll your hips slightly forward so your hips are stacked directly over each other and your left / right knee is facing forward. 3. Leading with your heel, lift your top leg 4-6 inches (10-15 cm). You should feel the muscles in your outer hip lifting. ? Do not let your foot drift forward. ? Do not let your knee roll toward the ceiling. 4. Hold this position for __________ seconds. 5. Slowly return your leg to the starting position. 6. Let your muscles relax completely after each repetition. Repeat __________ times. Complete this exercise __________ times a day. Straight leg raises This exercise stretches the muscles that move your hips away from the front of the pelvis (hip extensors). 1. Lie on your abdomen on a firm surface. You can put a pillow under your hips if that is more comfortable. 2. Tense the muscles in your buttocks and lift your left / right leg about 4-6 inches (10-15 cm). Keep your knee straight as you lift your leg. 3. Hold this position for __________ seconds. 4. Slowly lower your leg to the starting position. 5. Let your leg relax completely after each repetition. Repeat __________ times. Complete this exercise __________ times a day. This information is not intended to replace advice given to you by your health care provider. Make sure you discuss any questions you have with your health care provider. Document Released: 01/23/2005 Document Revised: 12/30/2017 Document Reviewed: 12/30/2017 Elsevier Patient Education  2020 [place].  Shoulder Impingement Syndrome Rehab Ask your health care provider which exercises are safe for you. Do exercises exactly as told by your health care provider and adjust them as directed. It is normal to feel mild stretching, pulling, tightness, or discomfort as you do these exercises. Stop right away if you feel sudden pain or your  pain gets worse. Do not begin these exercises until told by your health care provider. Stretching and range-of-motion exercise This exercise warms up your muscles and joints and improves the movement and flexibility of your shoulder. This exercise also helps to relieve pain and stiffness. Passive horizontal adduction In passive adduction, you use your other hand to move the injured arm toward your body. The injured arm does not move on its own. In this movement, your arm is moved across your body in the horizontal plane (horizontal adduction). 1.  Sit or stand and pull your left / right elbow across your chest, toward your other shoulder. Stop when you feel a gentle stretch in the back of your shoulder and upper arm. ? Keep your arm at shoulder height. ? Keep your arm as close to your body as you comfortably can. 2. Hold for __________ seconds. 3. Slowly return to the starting position. Repeat __________ times. Complete this exercise __________ times a day. Strengthening exercises These exercises build strength and endurance in your shoulder. Endurance is the ability to use your muscles for a long time, even after they get tired. External rotation, isometric This is an exercise in which you press the back of your wrist against a door frame without moving your shoulder joint (isometric). 1. Stand or sit in a doorway, facing the door frame. 2. Bend your left / right elbow and place the back of your wrist against the door frame. Only the back of your wrist should be touching the frame. Keep your upper arm at your side. 3. Gently press your wrist against the door frame, as if you are trying to push your arm away from your abdomen (external rotation). Press as hard as you are able without pain. ? Avoid shrugging your shoulder while you press your wrist against the door frame. Keep your shoulder blade tucked down toward the middle of your back. 4. Hold for __________ seconds. 5. Slowly release the  tension, and relax your muscles completely before you repeat the exercise. Repeat __________ times. Complete this exercise __________ times a day. Internal rotation, isometric This is an exercise in which you press your palm against a door frame without moving your shoulder joint (isometric). 1. Stand or sit in a doorway, facing the door frame. 2. Bend your left / right elbow and place the palm of your hand against the door frame. Only your palm should be touching the frame. Keep your upper arm at your side. 3. Gently press your hand against the door frame, as if you are trying to push your arm toward your abdomen (internal rotation). Press as hard as you are able without pain. ? Avoid shrugging your shoulder while you press your hand against the door frame. Keep your shoulder blade tucked down toward the middle of your back. 4. Hold for __________ seconds. 5. Slowly release the tension, and relax your muscles completely before you repeat the exercise. Repeat __________ times. Complete this exercise __________ times a day. Scapular protraction, supine  1. Lie on your back on a firm surface (supine position). Hold a __________ weight in your left / right hand. 2. Raise your left / right arm straight into the air so your hand is directly above your shoulder joint. 3. Push the weight into the air so your shoulder (scapula) lifts off the surface that you are lying on. The scapula will push up or forward (protraction). Do not move your head, neck, or back. 4. Hold for __________ seconds. 5. Slowly return to the starting position. Let your muscles relax completely before you repeat this exercise. Repeat __________ times. Complete this exercise __________ times a day. Scapular retraction  5. Sit in a stable chair without armrests, or stand up. 6. Secure an exercise band to a stable object in front of you so the band is at shoulder height. 7. Hold one end of the exercise band in each hand. Your palms  should face down. 8. Squeeze your shoulder blades together (retraction) and move your elbows slightly behind you. Do  not shrug your shoulders upward while you do this. 9. Hold for __________ seconds. 10. Slowly return to the starting position. Repeat __________ times. Complete this exercise __________ times a day. Shoulder extension  7. Sit in a stable chair without armrests, or stand up. 8. Secure an exercise band to a stable object in front of you so the band is above shoulder height. 9. Hold one end of the exercise band in each hand. 10. Straighten your elbows and lift your hands up to shoulder height. 11. Squeeze your shoulder blades together and pull your hands down to the sides of your thighs (extension). Stop when your hands are straight down by your sides. Do not let your hands go behind your body. 12. Hold for __________ seconds. 13. Slowly return to the starting position. Repeat __________ times. Complete this exercise __________ times a day. This information is not intended to replace advice given to you by your health care provider. Make sure you discuss any questions you have with your health care provider. Document Released: 03/11/2005 Document Revised: 07/03/2018 Document Reviewed: 04/06/2018 Elsevier Patient Education  2020 [employer].

## 2019-02-24 DIAGNOSIS — M75102 Unspecified rotator cuff tear or rupture of left shoulder, not specified as traumatic: Secondary | ICD-10-CM | POA: Insufficient documentation

## 2019-02-24 DIAGNOSIS — M25519 Pain in unspecified shoulder: Secondary | ICD-10-CM | POA: Insufficient documentation

## 2019-02-24 DIAGNOSIS — M75101 Unspecified rotator cuff tear or rupture of right shoulder, not specified as traumatic: Secondary | ICD-10-CM | POA: Insufficient documentation

## 2019-02-24 NOTE — Assessment & Plan Note (Signed)
Counseled patient that I would not start Xanax because of its habit-forming potential and side effect profile.  We will look into therapy resources either in our clinic or in the community.  We will follow-up in 1 month.

## 2019-02-24 NOTE — Assessment & Plan Note (Signed)
Patient is amenable to PT, so this referral was made.  Printed exercises for her to use at home, but she left before she could be given these.  She is also amenable to a sports medicine referral for possible injection, so this referral was made.

## 2019-02-24 NOTE — Assessment & Plan Note (Signed)
Has a history of left shoulder pain, but is currently experiencing pain from her right shoulder.  Counseled patient on doing shoulder stretching and strengthening exercises and plan to give patient a handout regarding these, although she left before she could receive it.  She is amenable to PT, so this referral was made.

## 2019-02-25 ENCOUNTER — Telehealth: Payer: Self-pay | Admitting: *Deleted

## 2019-02-25 NOTE — Telephone Encounter (Signed)
Contacted by Synetta Shadow Day, NP (PCP)  with [facility]. [phone]. Patient seen in office 8/26 for f/u on elevated light chains (pt of [place]). Had CT had PET on 9/14 as ordered, but patient was never scheduled to come back. Office was contacted by her PCP who said patient remembers talking with you on the phone, but not what you said. Pt wants to know if she has cancer. Dr. Grier Mitts response: PET with No evidence of hypermetabolic malignancy. No focal skeletal hypermetabolism. Noted to have some bone changes - likely from Vit D deficiency. Would recommend taking Ergocalciferol 50k units weekly OTC and following with PCP for mx of vit D deficiency. Can f/u with PCP, no need to return here. Attempted to contact Ms Day with this information. Left message for her to contact this office.

## 2019-03-01 ENCOUNTER — Encounter: Payer: Self-pay | Admitting: Family Medicine

## 2019-03-10 ENCOUNTER — Ambulatory Visit: Payer: Medicare HMO | Admitting: Sports Medicine

## 2019-03-24 ENCOUNTER — Ambulatory Visit: Payer: Medicare HMO | Admitting: Physician Assistant

## 2019-04-14 ENCOUNTER — Ambulatory Visit: Payer: Medicare HMO | Admitting: Physician Assistant

## 2019-04-19 ENCOUNTER — Other Ambulatory Visit: Payer: Self-pay

## 2019-04-26 ENCOUNTER — Emergency Department (HOSPITAL_COMMUNITY)
Admission: EM | Admit: 2019-04-26 | Discharge: 2019-04-26 | Disposition: A | Discharge status: Home / Self Care | Payer: Medicare HMO | Attending: Emergency Medicine | Admitting: Emergency Medicine

## 2019-04-26 ENCOUNTER — Other Ambulatory Visit: Payer: Self-pay

## 2019-04-26 ENCOUNTER — Encounter (HOSPITAL_COMMUNITY): Payer: Self-pay | Admitting: Emergency Medicine

## 2019-04-26 DIAGNOSIS — Z79899 Other long term (current) drug therapy: Secondary | ICD-10-CM | POA: Insufficient documentation

## 2019-04-26 DIAGNOSIS — Z87891 Personal history of nicotine dependence: Secondary | ICD-10-CM | POA: Diagnosis not present

## 2019-04-26 DIAGNOSIS — Z7982 Long term (current) use of aspirin: Secondary | ICD-10-CM | POA: Diagnosis not present

## 2019-04-26 DIAGNOSIS — R1084 Generalized abdominal pain: Secondary | ICD-10-CM | POA: Insufficient documentation

## 2019-04-26 DIAGNOSIS — R112 Nausea with vomiting, unspecified: Secondary | ICD-10-CM | POA: Insufficient documentation

## 2019-04-26 DIAGNOSIS — I10 Essential (primary) hypertension: Secondary | ICD-10-CM | POA: Insufficient documentation

## 2019-04-26 LAB — CBC WITH DIFFERENTIAL/PLATELET
Abs Immature Granulocytes: 0.04 10*3/uL (ref 0.00–0.07)
Basophils Absolute: 0 10*3/uL (ref 0.0–0.1)
Basophils Relative: 0 %
Eosinophils Absolute: 0.1 10*3/uL (ref 0.0–0.5)
Eosinophils Relative: 1 %
HCT: 43.1 % (ref 36.0–46.0)
Hemoglobin: 13.9 g/dL (ref 12.0–15.0)
Immature Granulocytes: 1 %
Lymphocytes Relative: 27 %
Lymphs Abs: 2.3 10*3/uL (ref 0.7–4.0)
MCH: 28.1 pg (ref 26.0–34.0)
MCHC: 32.3 g/dL (ref 30.0–36.0)
MCV: 87.2 fL (ref 80.0–100.0)
Monocytes Absolute: 0.4 10*3/uL (ref 0.1–1.0)
Monocytes Relative: 5 %
Neutro Abs: 5.7 10*3/uL (ref 1.7–7.7)
Neutrophils Relative %: 66 %
Platelets: 195 10*3/uL (ref 150–400)
RBC: 4.94 MIL/uL (ref 3.87–5.11)
RDW: 16.3 % — ABNORMAL HIGH (ref 11.5–15.5)
WBC: 8.6 10*3/uL (ref 4.0–10.5)
nRBC: 0 % (ref 0.0–0.2)

## 2019-04-26 LAB — COMPREHENSIVE METABOLIC PANEL
ALT: 22 U/L (ref 0–44)
AST: 21 U/L (ref 15–41)
Albumin: 4.1 g/dL (ref 3.5–5.0)
Alkaline Phosphatase: 72 U/L (ref 38–126)
Anion gap: 15 (ref 5–15)
BUN: 14 mg/dL (ref 6–20)
CO2: 19 mmol/L — ABNORMAL LOW (ref 22–32)
Calcium: 8.7 mg/dL — ABNORMAL LOW (ref 8.9–10.3)
Chloride: 106 mmol/L (ref 98–111)
Creatinine, Ser: 0.84 mg/dL (ref 0.44–1.00)
GFR calc Af Amer: >60 mL/min (ref >60)
GFR calc non Af Amer: >60 mL/min (ref >60)
Glucose, Bld: 149 mg/dL — ABNORMAL HIGH (ref 70–99)
Potassium: 3.3 mmol/L — ABNORMAL LOW (ref 3.5–5.1)
Sodium: 140 mmol/L (ref 135–145)
Total Bilirubin: 1.1 mg/dL (ref 0.3–1.2)
Total Protein: 7.2 g/dL (ref 6.5–8.1)

## 2019-04-26 LAB — URINALYSIS, ROUTINE W REFLEX MICROSCOPIC
Bilirubin Urine: NEGATIVE
Glucose, UA: NEGATIVE mg/dL
Hgb urine dipstick: NEGATIVE
Ketones, ur: 20 mg/dL — AB
Leukocytes,Ua: NEGATIVE
Nitrite: NEGATIVE
Protein, ur: 30 mg/dL — AB
Specific Gravity, Urine: 1.015 (ref 1.005–1.030)
pH: 7 (ref 5.0–8.0)

## 2019-04-26 LAB — LIPASE, BLOOD: Lipase: 22 U/L (ref 11–51)

## 2019-04-26 MED ORDER — CAPSAICIN 0.075 % EX CREA
TOPICAL_CREAM | Freq: Two times a day (BID) | CUTANEOUS | Status: DC
  Administered 2019-04-26: 1 via TOPICAL
  Filled 2019-04-26: qty 60

## 2019-04-26 MED ORDER — HYDROMORPHONE HCL 1 MG/ML IJ SOLN
1.0000 mg | Freq: Once | INTRAMUSCULAR | Status: AC
  Administered 2019-04-26: 1 mg via INTRAVENOUS
  Filled 2019-04-26: qty 1

## 2019-04-26 MED ORDER — PROMETHAZINE HCL 25 MG/ML IJ SOLN
25.0000 mg | Freq: Once | INTRAMUSCULAR | Status: AC
  Administered 2019-04-26: 25 mg via INTRAVENOUS
  Filled 2019-04-26: qty 1

## 2019-04-26 MED ORDER — ALUM & MAG HYDROXIDE-SIMETH 200-200-20 MG/5ML PO SUSP
30.0000 mL | Freq: Once | ORAL | Status: AC
  Administered 2019-04-26: 21:00:00 30 mL via ORAL
  Filled 2019-04-26: qty 30

## 2019-04-26 MED ORDER — SODIUM CHLORIDE 0.9 % IV BOLUS
1000.0000 mL | Freq: Once | INTRAVENOUS | Status: AC
  Administered 2019-04-26: 1000 mL via INTRAVENOUS

## 2019-04-26 MED ORDER — LIDOCAINE VISCOUS HCL 2 % MT SOLN
15.0000 mL | Freq: Once | OROMUCOSAL | Status: AC
  Administered 2019-04-26: 15 mL via ORAL
  Filled 2019-04-26: qty 15

## 2019-04-26 MED ORDER — PROMETHAZINE HCL 25 MG RE SUPP: 25.0000 mg | Freq: Four times a day (QID) | RECTAL | 0 refills | Status: DC | PRN

## 2019-04-26 NOTE — Discharge Instructions (Signed)
Shannon Stuart,   I sent a prescription for Phenergan Suppositories to [facility]. Use 1 suppository as needed for nausea every 4-6 hours. Please do not take any oral Phenergan if you use the suppository.

## 2019-04-26 NOTE — ED Notes (Signed)
Patient given ginger ale per request

## 2019-04-26 NOTE — ED Notes (Signed)
Patient unable to provide urine sample at this time. Will try again later.

## 2019-04-26 NOTE — ED Notes (Signed)
Patient verbalized her concern about discharge, that last time she was seen for this in September that she had to come back and get admitted. This nurse offered to speak with the provider but patient states she verbalized her concern to them already. This nurse educated patient on Phenergan suppository prescription

## 2019-04-26 NOTE — ED Notes (Signed)
Patient drinking ginger ale. Patient is not nauseas or vomiting. Patient is tolerating the ginger ale.

## 2019-04-26 NOTE — ED Triage Notes (Signed)
Patient arrived by EMS from home, CC abdominal pain w/ n/v since Friday.   BP 160/100  22 g L hand 100 mcg fentanyl
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
## 2019-04-27 ENCOUNTER — Ambulatory Visit (INDEPENDENT_AMBULATORY_CARE_PROVIDER_SITE_OTHER): Payer: Medicare HMO | Admitting: Physician Assistant

## 2019-04-27 ENCOUNTER — Encounter: Payer: Self-pay | Admitting: Physician Assistant

## 2019-04-27 VITALS — BP 134/90 | HR 88 | Temp 98.3°F | Ht 68.0 in | Wt 186.0 lb

## 2019-04-27 DIAGNOSIS — R1010 Upper abdominal pain, unspecified: Secondary | ICD-10-CM

## 2019-04-27 DIAGNOSIS — R112 Nausea with vomiting, unspecified: Secondary | ICD-10-CM | POA: Diagnosis not present

## 2019-04-27 DIAGNOSIS — Z01818 Encounter for other preprocedural examination: Secondary | ICD-10-CM | POA: Diagnosis not present

## 2019-04-27 NOTE — Patient Instructions (Addendum)
If you are age 61 or older, your body mass index should be between 23-30. Your Body mass index is 28.28 kg/m. If this is out of the aforementioned range listed, please consider follow up with your Primary Care Provider.  If you are age 66 or younger, your body mass index should be between 19-25. Your Body mass index is 28.28 kg/m. If this is out of the aformentioned range listed, please consider follow up with your Primary Care Provider.  You have been scheduled for an endoscopy. Please follow written instructions given to you at your visit today. If you use inhalers (even only as needed), please bring them with you on the day of your procedure.  Due to recent changes in healthcare laws, you may see the results of your imaging and laboratory studies on MyChart before your provider has had a chance to review them.  We understand that in some cases there may be results that are confusing or concerning to you. Not all laboratory results come back in the same time frame and the provider may be waiting for multiple results in order to interpret others.  Please give [place] 48 hours in order for your provider to thoroughly review all the results before contacting the office for clarification of your results.   We have sent the following medications to your pharmacy for you to pick up at your convenience:  Protinix 40 mg  Phenergan supossitories 25mg  every 6-8 hours for nausea-take regularly so that you keep liquids down.  Push Liquids Stop Ibuprofen  We will schedule a CT scan when you are no longer vomiting.   Thank you for choosing me and [facility]

## 2019-04-27 NOTE — Progress Notes (Signed)
Subjective:    Patient ID: Shannon Stuart, female    DOB: 05/05/1960, 59 y.o.   MRN: [id number]  HPI Shannon Stuart is a 59 year old African-American female, established with Dr. Ardis Hughs who was last seen in our office in 2015.  She is referred today by Maia Breslow, MD/[facility] for evaluation of persistent nausea vomiting and abdominal pain. Patient was actually in the emergency room yesterday with complaints of intractable nausea and vomiting.  She did not have any imaging done but did have labs which have been reviewed.  All unremarkable with exception of potassium at 3.3. Patient has history of hypertension, coronary artery disease, GERD, anxiety/depression and kappa chain light disease. She had undergone EGD and colonoscopy in May 2015 with finding of mild distal gastritis at EGD, and that colonoscopy had subepithelial nodules in the right colon which were biopsied and found to be benign lymphoid polyps and also had 2 tubular adenomatous polyps removed both 4 to 7 mm in size.  She is indicated for follow-up in 2022. Patient had undergone CT scan of the abdomen and pelvis in August 2020 again with complaints of nausea vomiting and abdominal pain she was noted to have a fatty liver, is status post cholecystectomy, and was noted to have innumerable lytic lesions of the vertebral bodies and pelvic bones concerning for multiple myeloma versus metastatic disease versus bone marrow abnormality.  Patient has been having ongoing problems with chronic back pain for which she is on narcotics.  She did have evaluation with Dr.Kale , and PET scan was done in September 2020 which did not show any evidence of metastatic disease.  She was to follow-up with him for consideration of bone marrow biopsy versus MRI but has not been seen since.  Patient says her problems with nausea and vomiting started in June and have progressively gotten worse to the point where she is having symptoms on a daily basis currently.   She says she will feel constantly nauseated and over the past 5 days says she really has not been able to keep anything down.  She had multiple episodes of nausea and vomiting over the weekend.  She has prescription for Phenergan but says she cannot keep down oral antiemetics when she is so sick.  She has a prescription for Phenergan suppositories but had been unable to get them filled until today.  She cannot take Zofran.  She has been on Protonix daily.  She is also been taking 800 mg of ibuprofen 3 times daily for back pain.  Bowel movements generally constipated, no melena or hematochezia.  She says today her whole abdomen and chest feels sore she thinks from vomiting so many times but has had ongoing problems with abdominal pain as well. On further questioning she does smoke marijuana on a fairly regular basis though not daily and says she has not been using any since she has been feeling so sick over this past week.  She relates that she has been told by other physicians regarding the connection with regular cannabis use and vomiting.  Review of Systems Pertinent positive and negative review of systems were noted in the above HPI section.  All other review of systems was otherwise negative.  Outpatient Encounter Medications as of 04/27/2019  Medication Sig  . ALPRAZolam (XANAX) 1 MG tablet Take 1 mg by mouth 3 (three) times daily.   Marland Kitchen amLODipine (NORVASC) 10 MG tablet Take 1 tablet (10 mg total) by mouth daily.  Marland Kitchen aspirin EC 81  MG tablet Take 81 mg by mouth daily.  Marland Kitchen atorvastatin (LIPITOR) 10 MG tablet Take 10 mg by mouth daily at 6 PM.   . capsaicin (ZOSTRIX) 0.025 % cream Apply topically 2 (two) times daily. Apply to abdomen (Patient taking differently: Apply 1 application topically 2 (two) times daily as needed (pain). Apply to abdomen)  . cholecalciferol (VITAMIN D3) 25 MCG (1000 UNIT) tablet Take 3,000 Units by mouth daily.  . cyclobenzaprine (FLEXERIL) 10 MG tablet TAKE 1 TABLET BY MOUTH THREE  TIMES A DAY AS NEEDED FOR MUSCLE SPASMS (Patient taking differently: Take 10 mg by mouth 3 (three) times daily as needed for muscle spasms. )  . diclofenac sodium (VOLTAREN) 1 % GEL Apply 1 application topically daily as needed (pain).  Marland Kitchen HYDROcodone-acetaminophen (NORCO/VICODIN) 5-325 MG tablet Take 1-2 tablets by mouth every 4 (four) hours as needed for moderate pain.   Marland Kitchen ibuprofen (ADVIL,MOTRIN) 800 MG tablet TAKE 1 TABLET BY MOUTH EVERY 8 HOURS AS NEEDED FOR MODERATE PAIN (Patient taking differently: Take 800 mg by mouth 3 (three) times daily. )  . lisinopril (PRINIVIL,ZESTRIL) 40 MG tablet Take 1 tablet (40 mg total) by mouth daily.  . Omega-3 Fatty Acids (FISH OIL) 1000 MG CAPS Take 1 capsule by mouth daily.  . pantoprazole (PROTONIX) 40 MG tablet Take 1 tablet (40 mg total) by mouth daily.  . Potassium 99 MG TABS Take 1 tablet by mouth See admin instructions. Take on Mondays and Fridays  . promethazine (PHENERGAN) 25 MG suppository Place 1 suppository (25 mg total) rectally every 6 (six) hours as needed for nausea or vomiting.  . promethazine (PHENERGAN) 25 MG tablet Take 1 tablet (25 mg total) by mouth every 6 (six) hours as needed for nausea.  . [DISCONTINUED] ergocalciferol (VITAMIN D2) 1.25 MG (50000 UT) capsule Take 1 capsule (50,000 Units total) by mouth 2 (two) times a week. (Patient not taking: Reported on 04/26/2019)   No facility-administered encounter medications on file as of 04/27/2019.   Allergies  Allergen Reactions  . Zofran [Ondansetron Hcl] Nausea And Vomiting  . Morphine And Related Other (See Comments)    Makes "loopy" and chest "feel funny".    Patient Active Problem List   Diagnosis Date Noted  . Shoulder pain   . Emesis 11/13/2018  . Generalized abdominal pain   . Intractable vomiting   . Diarrhea with dehydration 11/11/2018  . Leukocytosis   . Elevated serum creatinine 03/06/2017  . Skin lesion of right arm 01/02/2017  . Fingernail abnormalities 01/02/2017  .  Rash 12/31/2016  . Changes in vision 09/20/2016  . Amplified musculoskeletal pain, diffuse 09/20/2016  . Trigger point of extremity 09/20/2016  . Chronic diarrhea 09/20/2016  . Chronic bilateral lower abdominal pain 09/20/2016  . 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[place]) 09/17/2016  . Hypokalemia due to excessive gastrointestinal loss of potassium 08/30/2016  . Bone lesion 08/30/2016  . Non-obstructive CAD   . Obesity, Class II, BMI 35-39.9   . Chronic low back pain 10/12/2013  . Well woman exam 09/02/2013  . Prediabetes 06/02/2013  . Trigger point of shoulder region 02/20/2013  . Insomnia 08/18/2012  . Arthritis of both knees 04/11/2011  . Lower extremity edema 09/24/2010  . Depression 08/10/2010  . HLD (hyperlipidemia) 08/04/2009  . Leiomyoma of uterus 04/29/2008  . Dysfunctional uterine bleeding 04/26/2008  . Anxiety with depression 09/15/2006  . RHINITIS, ALLERGIC NOS 09/08/2006  . GERD 09/08/2006  . HYPERTENSION, BENIGN ESSENTIAL 08/04/2006   Social History   Socioeconomic History  .  Marital status: Single    Spouse name: Not on file  . Number of children: 1  . Years of education: Not on file  . Highest education level: Not on file  Occupational History  . Occupation: retired  Tobacco Use  . Smoking status: Former Smoker    Types: Cigarettes    Quit date: 03/26/1991    Years since quitting: 28.1  . Smokeless tobacco: Never Used  Substance and Sexual Activity  . Alcohol use: Yes    Comment: 'SOCIALLY"  . Drug use: Yes    Types: Marijuana    Comment: Depends on emotions  . Sexual activity: Yes    Partners: Female    Birth control/protection: Injection  Other Topics Concern  . Not on file  Social History Narrative  . Not on file   Social Determinants of Health   Financial Resource Strain:   . Difficulty of Paying Living Expenses: Not on file  Food Insecurity:   . Worried About Charity fundraiser in the Last Year: Not on file  . Ran Out of Food in the Last Year:  Not on file  Transportation Needs:   . Lack of Transportation (Medical): Not on file  . Lack of Transportation (Non-Medical): Not on file  Physical Activity:   . Days of Exercise per Week: Not on file  . Minutes of Exercise per Session: Not on file  Stress:   . Feeling of Stress : Not on file  Social Connections:   . Frequency of Communication with Friends and Family: Not on file  . Frequency of Social Gatherings with Friends and Family: Not on file  . Attends Religious Services: Not on file  . Active Member of Clubs or Organizations: Not on file  . Attends Archivist Meetings: Not on file  . Marital Status: Not on file  Intimate Partner Violence:   . Fear of Current or Ex-Partner: Not on file  . Emotionally Abused: Not on file  . Physically Abused: Not on file  . Sexually Abused: Not on file    Ms. Eskelson's family history includes Asthma in her sister; COPD in her sister; Diabetes in her maternal grandmother, sister, and sister; Heart disease in her maternal grandmother; Heart disease (age of onset: 4) in her mother; Heart failure in her maternal grandmother; Hypertension in an other family member.      Objective:    Vitals:   04/27/19 1512  BP: 134/90  Pulse: 88  Temp: 98.3 F (36.8 C)    Physical Exam Well-developed well-nourished older African-American female female in no acute distress. , Weight, 186 BMI 28.2  HEENT; nontraumatic normocephalic, EOMI, PER R LA, sclera anicteric.,  Very deep voice Oropharynx; not examined Neck; supple, no JVD Cardiovascular; regular rate and rhythm with S1-S2, no murmur rub or gallop Pulmonary; Clear bilaterally Abdomen; soft, rather generalized abdominal tenderness, more tender in the upper abdomen, she also has some right lower quadrant tenderness tenderness over the right hip, nondistended, no palpable mass or hepatosplenomegaly, bowel sounds are active Rectal; not done today Skin; benign exam, no jaundice rash or  appreciable lesions Extremities; no clubbing cyanosis or edema skin warm and dry Neuro/Psych; alert and oriented x4, grossly nonfocal mood and affect appropriate       Assessment & Plan:   #42 59 year old African-American female with 3-monthhistory of progressive nausea vomiting and abdominal pain. Patient is status post cholecystectomy, CT of the abdomen and pelvis in August 2020 for similar symptoms did not  show any abdominal findings to account for symptoms. She has been on high-dose NSAIDs, rule out peptic ulcer disease, chronic gastropathy. Regular cannabis use rule out component of cannabis hyperemesis type syndrome. Innumerable lytic lesions of the vertebral bodies and pelvic bones, PET scan in September 2020 did not show evidence of hypermetabolic malignancy.  Further work-up is indicated, and at this time not clear whether underlying process may be contributing to her current GI symptoms.  #2 hypertension 3.  Coronary artery disease 4.  Anxiety depression 5.  Kappa chain light disease #6 history of adenomatous colon polyps due for follow-up colonoscopy 2022  Plan; increase Protonix to 40 mg p.o. twice daily Patient was strongly advised to stop ibuprofen Patient will get the prescription for Phenergan suppositories today and asked her to use them on a regular basis over the next several days until she can adequately keep down p.o.'s then may alternate with oral Phenergan. Push fluids Stop cannabis use We discussed repeat CT of the abdomen and pelvis however she is not keeping down p.o.'s at this point.  She does not want to schedule at present have asked her to call back when she is able to keep down p.o.'s with use of Phenergan suppositories and will schedule at that time. Schedule for EGD with Dr. Ardis Hughs.  Procedure was discussed in detail with patient including indications risks and benefits and she is agreeable to proceed.  Appointment was made for the patient today with Dr.  Irene Limbo for follow-up, and further work-up  She requested a note for work for tomorrow and day of procedure.  Tishanna Dunford S [place]ha Behlke PA-C 04/27/2019   Cc: Kathrene Alu, MD

## 2019-04-28 ENCOUNTER — Other Ambulatory Visit: Payer: Self-pay | Admitting: General Surgery

## 2019-04-28 ENCOUNTER — Telehealth: Payer: Self-pay | Admitting: Physician Assistant

## 2019-04-28 MED ORDER — PROMETHAZINE HCL 25 MG RE SUPP: RECTAL | 0 refills | Status: DC

## 2019-04-28 NOTE — Telephone Encounter (Signed)
Pt states that her pharmacy has not received order for suppositories. She uses [facility] on [address].

## 2019-04-28 NOTE — Progress Notes (Signed)
I agre with the above note, plan

## 2019-04-29 ENCOUNTER — Ambulatory Visit: Payer: Medicare HMO | Admitting: Physician Assistant

## 2019-05-10 ENCOUNTER — Inpatient Hospital Stay: Payer: Medicare HMO

## 2019-05-10 ENCOUNTER — Other Ambulatory Visit: Payer: Self-pay

## 2019-05-10 ENCOUNTER — Inpatient Hospital Stay: Payer: Medicare HMO | Attending: Hematology & Oncology | Admitting: Hematology & Oncology

## 2019-05-10 VITALS — BP 118/79 | HR 65 | Temp 97.1°F | Resp 18 | Wt 202.0 lb

## 2019-05-10 DIAGNOSIS — R630 Anorexia: Secondary | ICD-10-CM | POA: Insufficient documentation

## 2019-05-10 DIAGNOSIS — M899 Disorder of bone, unspecified: Secondary | ICD-10-CM

## 2019-05-10 DIAGNOSIS — Z87891 Personal history of nicotine dependence: Secondary | ICD-10-CM | POA: Diagnosis not present

## 2019-05-10 DIAGNOSIS — G43A1 Cyclical vomiting, intractable: Secondary | ICD-10-CM

## 2019-05-10 DIAGNOSIS — D8989 Other specified disorders involving the immune mechanism, not elsewhere classified: Secondary | ICD-10-CM | POA: Diagnosis not present

## 2019-05-10 DIAGNOSIS — R634 Abnormal weight loss: Secondary | ICD-10-CM | POA: Diagnosis not present

## 2019-05-10 DIAGNOSIS — Z79899 Other long term (current) drug therapy: Secondary | ICD-10-CM | POA: Insufficient documentation

## 2019-05-10 DIAGNOSIS — R42 Dizziness and giddiness: Secondary | ICD-10-CM | POA: Diagnosis not present

## 2019-05-10 LAB — CBC WITH DIFFERENTIAL (CANCER CENTER ONLY)
Abs Immature Granulocytes: 0.02 10*3/uL (ref 0.00–0.07)
Basophils Absolute: 0 10*3/uL (ref 0.0–0.1)
Basophils Relative: 0 %
Eosinophils Absolute: 0.2 10*3/uL (ref 0.0–0.5)
Eosinophils Relative: 3 %
HCT: 35.8 % — ABNORMAL LOW (ref 36.0–46.0)
Hemoglobin: 11.2 g/dL — ABNORMAL LOW (ref 12.0–15.0)
Immature Granulocytes: 0 %
Lymphocytes Relative: 40 %
Lymphs Abs: 2.9 10*3/uL (ref 0.7–4.0)
MCH: 27.9 pg (ref 26.0–34.0)
MCHC: 31.3 g/dL (ref 30.0–36.0)
MCV: 89.1 fL (ref 80.0–100.0)
Monocytes Absolute: 0.5 10*3/uL (ref 0.1–1.0)
Monocytes Relative: 7 %
Neutro Abs: 3.6 10*3/uL (ref 1.7–7.7)
Neutrophils Relative %: 50 %
Platelet Count: 219 10*3/uL (ref 150–400)
RBC: 4.02 MIL/uL (ref 3.87–5.11)
RDW: 16.7 % — ABNORMAL HIGH (ref 11.5–15.5)
WBC Count: 7.3 10*3/uL (ref 4.0–10.5)
nRBC: 0 % (ref 0.0–0.2)

## 2019-05-10 LAB — CMP (CANCER CENTER ONLY)
ALT: 14 U/L (ref 0–44)
AST: 13 U/L — ABNORMAL LOW (ref 15–41)
Albumin: 4 g/dL (ref 3.5–5.0)
Alkaline Phosphatase: 81 U/L (ref 38–126)
Anion gap: 4 — ABNORMAL LOW (ref 5–15)
BUN: 20 mg/dL (ref 6–20)
CO2: 30 mmol/L (ref 22–32)
Calcium: 9.1 mg/dL (ref 8.9–10.3)
Chloride: 105 mmol/L (ref 98–111)
Creatinine: 1.12 mg/dL — ABNORMAL HIGH (ref 0.44–1.00)
GFR, Est AFR Am: >60 mL/min (ref >60)
GFR, Estimated: 53 mL/min — ABNORMAL LOW (ref >60)
Glucose, Bld: 109 mg/dL — ABNORMAL HIGH (ref 70–99)
Potassium: 4.4 mmol/L (ref 3.5–5.1)
Sodium: 139 mmol/L (ref 135–145)
Total Bilirubin: 0.3 mg/dL (ref 0.3–1.2)
Total Protein: 6 g/dL — ABNORMAL LOW (ref 6.5–8.1)

## 2019-05-10 LAB — SAVE SMEAR(SSMR), FOR PROVIDER SLIDE REVIEW

## 2019-05-10 NOTE — Progress Notes (Signed)
Hematology and Oncology Follow Up Visit  Shannon Stuart [id number] Aug 02, 1960 59 y.o. 05/10/2019   Principle Diagnosis:   Weight loss, poor appetite, nausea/vomiting  Current Therapy:    Observation     Interim History:  Shannon Stuart is in for her first office visit.  She apparently has seen other doctors.  She has been seen at the [facility].  She apparently was in the hospital at [facility] back in August.  Looks like she had gastroenteritis.  However, she was told that she probably had cancer.  She had a CT scan that showed lytic lesions in her bones.  She was seen at the main [place].  She had a PET scan which was negative.  She had 24-hour urine which was negative for any Bence-Jones protein.  So far, no cancer has been found.  She still feels horrible.  She has lost weight.  She has no appetite.  She is going to have a upper endoscopy by gastroenterology on February 24.  She has had no swollen lymph nodes.  She has had no fever.  She has had no mouth sores.  She has had no rashes.  She does have numerous seborrheic keratoses.  There is no history of cancer in the family.  She stopped smoking back in 1993.  She has had no bleeding although she says there is some "spotting."  Her family doctor takes care of her gynecological needs.  She is convinced that she has cancer.  She is just frustrated that no one seems to be helping her or finding out what the problem is.  She is being sent to pain management and will see them on June 02, 2019.  She has had some bruising.  She is on quite a few medications.  I am sure these probably are playing a role with the bruising.  Her labs never shown any issues with anemia.  She does not have any, renal insufficiency.  Her blood sugars have been on the higher side.  She does get dizzy she says.  Overall, I would say her performance status is ECOG 1.  Medications:  Current Outpatient Medications:  .  zolpidem  (AMBIEN) 5 MG tablet, Take 5 mg by mouth at bedtime as needed., Disp: , Rfl:  .  ALPRAZolam (XANAX) 1 MG tablet, Take 1 mg by mouth 3 (three) times daily. , Disp: , Rfl:  .  amLODipine (NORVASC) 10 MG tablet, Take 1 tablet (10 mg total) by mouth daily., Disp: 90 tablet, Rfl: 3 .  aspirin EC 81 MG tablet, Take 81 mg by mouth daily., Disp: , Rfl:  .  atorvastatin (LIPITOR) 10 MG tablet, Take 10 mg by mouth daily at 6 PM. , Disp: , Rfl:  .  capsaicin (ZOSTRIX) 0.025 % cream, Apply topically 2 (two) times daily. Apply to abdomen (Patient taking differently: Apply 1 application topically 2 (two) times daily as needed (pain). Apply to abdomen), Disp: 60 g, Rfl: 0 .  cholecalciferol (VITAMIN D3) 25 MCG (1000 UNIT) tablet, Take 3,000 Units by mouth daily., Disp: , Rfl:  .  cyclobenzaprine (FLEXERIL) 10 MG tablet, TAKE 1 TABLET BY MOUTH THREE TIMES A DAY AS NEEDED FOR MUSCLE SPASMS (Patient taking differently: Take 10 mg by mouth 3 (three) times daily as needed for muscle spasms. ), Disp: 90 tablet, Rfl: 2 .  diclofenac sodium (VOLTAREN) 1 % GEL, Apply 1 application topically daily as needed (pain)., Disp: , Rfl:  .  HYDROcodone-acetaminophen (NORCO/VICODIN)  5-325 MG tablet, Take 1-2 tablets by mouth every 4 (four) hours as needed for moderate pain. , Disp: , Rfl:  .  ibuprofen (ADVIL,MOTRIN) 800 MG tablet, TAKE 1 TABLET BY MOUTH EVERY 8 HOURS AS NEEDED FOR MODERATE PAIN (Patient taking differently: Take 800 mg by mouth 3 (three) times daily. ), Disp: 90 tablet, Rfl: 0 .  lisinopril (PRINIVIL,ZESTRIL) 40 MG tablet, Take 1 tablet (40 mg total) by mouth daily., Disp: 30 tablet, Rfl: 11 .  Omega-3 Fatty Acids (FISH OIL) 1000 MG CAPS, Take 1 capsule by mouth daily., Disp: , Rfl:  .  pantoprazole (PROTONIX) 40 MG tablet, Take 1 tablet (40 mg total) by mouth daily., Disp: 90 tablet, Rfl: 3 .  Potassium 99 MG TABS, Take 1 tablet by mouth See admin instructions. Take on Mondays and Fridays, Disp: , Rfl:  .   promethazine (PHENERGAN) 25 MG suppository, Use 1 suppository every 6-8 hours as needed, Disp: 12 each, Rfl: 0 .  promethazine (PHENERGAN) 25 MG tablet, Take 1 tablet (25 mg total) by mouth every 6 (six) hours as needed for nausea., Disp: 10 tablet, Rfl: 0 .  traZODone (DESYREL) 50 MG tablet, Take 50 mg by mouth daily., Disp: , Rfl:   Allergies:  Allergies  Allergen Reactions  . Zofran [Ondansetron Hcl] Nausea And Vomiting  . Morphine And Related Other (See Comments)    Makes "loopy" and chest "feel funny".     Past Medical History, Surgical history, Social history, and Family History were reviewed and updated.  Review of Systems: Review of Systems  Constitutional: Positive for appetite change, fatigue and unexpected weight change.  HENT:  Negative.   Eyes: Negative.   Respiratory: Positive for chest tightness.   Cardiovascular: Negative.   Gastrointestinal: Positive for nausea and vomiting.  Endocrine: Negative.   Musculoskeletal: Positive for arthralgias, flank pain and myalgias.  Skin: Negative.   Neurological: Positive for dizziness.  Hematological: Bruises/bleeds easily.  Psychiatric/Behavioral: Negative.     Physical Exam:  weight is 202 lb (91.6 kg). Her temporal temperature is 97.1 F (36.2 C) (abnormal). Her blood pressure is 118/79 and her pulse is 65. Her respiration is 18 and oxygen saturation is 99%.   Wt Readings from Last 3 Encounters:  05/10/19 202 lb (91.6 kg)  04/27/19 186 lb (84.4 kg)  04/26/19 197 lb (89.4 kg)    Physical Exam   This is a well-developed well-nourished African-American female in no obvious distress.  Vital signs are temperature of 97.1.  Pulse 65.  Blood pressure 118/79.  Weight is 202 pounds.  Her head neck exam shows no ocular or oral lesions.  She has no scleral icterus.  There is no obvious adenopathy in the neck.  Thyroid is nonpalpable.  Lungs are relatively clear bilaterally.  She has no rales, wheezes or rhonchi.  Cardiac exam  regular rate and rhythm with normal S1 and S2.  There are no murmurs, rubs or bruits.  Abdomen is soft.  She is tender in the lower abdomen.  Bowel sounds are present.  There is no palpable liver or spleen tip.  She has no fluid wave.  Back exam shows lumbar laminectomy scar.  She has some slight tenderness throughout the back.  Extremities shows no clubbing, cyanosis or edema.  She has decent range of motion of her joints.  She has no joint swelling.  She has good strength in upper and lower extremities.  Skin exam shows numerous seborrheic keratoses.  Neurological exam is nonfocal.  Lab Results  Component Value Date   WBC 7.3 05/10/2019   HGB 11.2 (L) 05/10/2019   HCT 35.8 (L) 05/10/2019   MCV 89.1 05/10/2019   PLT 219 05/10/2019     Chemistry      Component Value Date/Time   NA 139 05/10/2019 1523   NA 139 12/08/2018 1630   NA 141 12/20/2016 1017   K 4.4 05/10/2019 1523   K 3.4 (L) 12/20/2016 1017   CL 105 05/10/2019 1523   CO2 30 05/10/2019 1523   CO2 27 12/20/2016 1017   BUN 20 05/10/2019 1523   BUN 21 12/08/2018 1630   BUN 12.0 12/20/2016 1017   CREATININE 1.12 (H) 05/10/2019 1523   CREATININE 1.2 (H) 12/20/2016 1017      Component Value Date/Time   CALCIUM 9.1 05/10/2019 1523   CALCIUM 8.6 12/20/2016 1017   ALKPHOS 81 05/10/2019 1523   ALKPHOS 71 12/20/2016 1017   AST 13 (L) 05/10/2019 1523   AST 49 (H) 12/20/2016 1017   ALT 14 05/10/2019 1523   ALT 42 12/20/2016 1017   BILITOT 0.3 05/10/2019 1523   BILITOT 0.54 12/20/2016 1017      Impression and Plan: Shannon Stuart is a 59 year old African-American female.  I am not sure exactly what is going on with her.  I would like to hope that there is no cancer.  However, I do not think we can be able to prove this less we get a biopsy of something.  I think a bone marrow biopsy is probably indicated in a situation like this.  I know this is somewhat controversial.  However, I think that we could definitively tell her that  there is no hematologic cancer that is causing her symptoms.  I would also get a CT scan of the chest.  I would also get an MRI of the brain.  I guess that were with Shannon Stuart being African-American, sarcoidosis is always a possibility.  Hypothyroidism is certainly a possibility.  She has not had a TSH about 3 years.  I think it is worthwhile checking this.  I am just puzzled as to all of her symptoms.  I would be surprised if we did find cancer.  It will be interesting to see what the upper endoscopy has to show.  I would think that the final test or the test a last resort would be a exploratory laparotomy.  I think this would be an incredibly bold undertaking if this had to be done.  I spent about an hour with her today.  I just want to reassure her that we would do our best to try to find out what is causing all of her problems.  I did give her a prayer blanket.  She was very thankful for this.  We will plan to have her come back once we get the results back from all of our radiologic studies in the bone marrow biopsy.   [name], MD 2/15/20214:44 PM

## 2019-05-11 ENCOUNTER — Telehealth: Payer: Self-pay | Admitting: *Deleted

## 2019-05-11 ENCOUNTER — Telehealth: Payer: Self-pay | Admitting: Hematology & Oncology

## 2019-05-11 LAB — TSH: TSH: 1.117 u[IU]/mL (ref 0.308–3.960)

## 2019-05-11 LAB — HEMOGLOBINOPATHY EVALUATION
Hgb A2 Quant: 1.9 % (ref 1.8–3.2)
Hgb A: 98.1 % (ref 96.4–98.8)
Hgb C: 0 %
Hgb F Quant: 0 % (ref 0.0–2.0)
Hgb S Quant: 0 %
Hgb Variant: 0 %

## 2019-05-11 LAB — LACTATE DEHYDROGENASE: LDH: 183 U/L (ref 98–192)

## 2019-05-11 NOTE — Telephone Encounter (Signed)
Voice mail is full  

## 2019-05-11 NOTE — Telephone Encounter (Signed)
-----   Message from [name], MD sent at 05/11/2019 11:42 AM EST ----- Call - the thyroid function is normal!!  Laurey Arrow

## 2019-05-11 NOTE — Telephone Encounter (Signed)
No los 2/15 

## 2019-05-13 ENCOUNTER — Encounter: Payer: Self-pay | Admitting: Gastroenterology

## 2019-05-14 ENCOUNTER — Ambulatory Visit (HOSPITAL_COMMUNITY)
Admission: RE | Admit: 2019-05-14 | Discharge: 2019-05-14 | Disposition: A | Discharge status: Home / Self Care | Payer: Medicare HMO | Source: Ambulatory Visit | Attending: Hematology & Oncology | Admitting: Hematology & Oncology

## 2019-05-14 ENCOUNTER — Other Ambulatory Visit: Payer: Self-pay

## 2019-05-14 DIAGNOSIS — R634 Abnormal weight loss: Secondary | ICD-10-CM | POA: Diagnosis present

## 2019-05-14 DIAGNOSIS — M899 Disorder of bone, unspecified: Secondary | ICD-10-CM | POA: Diagnosis present

## 2019-05-14 DIAGNOSIS — G43A1 Cyclical vomiting, intractable: Secondary | ICD-10-CM | POA: Diagnosis present

## 2019-05-14 DIAGNOSIS — R5383 Other fatigue: Secondary | ICD-10-CM | POA: Diagnosis not present

## 2019-05-14 DIAGNOSIS — R109 Unspecified abdominal pain: Secondary | ICD-10-CM | POA: Diagnosis not present

## 2019-05-14 DIAGNOSIS — Z6830 Body mass index (BMI) 30.0-30.9, adult: Secondary | ICD-10-CM | POA: Insufficient documentation

## 2019-05-14 DIAGNOSIS — Z87891 Personal history of nicotine dependence: Secondary | ICD-10-CM | POA: Insufficient documentation

## 2019-05-14 MED ORDER — SODIUM CHLORIDE (PF) 0.9 % IJ SOLN
INTRAMUSCULAR | Status: AC
  Filled 2019-05-14: qty 50

## 2019-05-14 MED ORDER — IOHEXOL 300 MG/ML  SOLN
75.0000 mL | Freq: Once | INTRAMUSCULAR | Status: AC | PRN
  Administered 2019-05-14: 75 mL via INTRAVENOUS

## 2019-05-17 ENCOUNTER — Telehealth: Payer: Self-pay | Admitting: *Deleted

## 2019-05-17 ENCOUNTER — Ambulatory Visit (INDEPENDENT_AMBULATORY_CARE_PROVIDER_SITE_OTHER): Payer: Medicare HMO

## 2019-05-17 ENCOUNTER — Other Ambulatory Visit: Payer: Self-pay | Admitting: Gastroenterology

## 2019-05-17 DIAGNOSIS — Z1159 Encounter for screening for other viral diseases: Secondary | ICD-10-CM

## 2019-05-17 LAB — SARS CORONAVIRUS 2 (TAT 6-24 HRS): SARS Coronavirus 2: NEGATIVE

## 2019-05-17 NOTE — Telephone Encounter (Signed)
As noted below by Dr. Marin Olp,  I tried calling the patient to inform her that the CT scan of chest is normal. No cancer was seen. However, her voice mail is full and unable to leave a message.

## 2019-05-17 NOTE — Telephone Encounter (Signed)
-----   Message from [name], MD sent at 05/16/2019 12:18 PM EST ----- Call -the CT scan of the chest is normal.  No cancer seen!!  Laurey Arrow

## 2019-05-17 NOTE — Telephone Encounter (Signed)
Voicemail full.

## 2019-05-19 ENCOUNTER — Ambulatory Visit (AMBULATORY_SURGERY_CENTER): Payer: Medicare HMO | Admitting: Gastroenterology

## 2019-05-19 ENCOUNTER — Encounter: Payer: Self-pay | Admitting: Gastroenterology

## 2019-05-19 ENCOUNTER — Other Ambulatory Visit: Payer: Self-pay

## 2019-05-19 ENCOUNTER — Other Ambulatory Visit (HOSPITAL_COMMUNITY): Payer: Medicare HMO

## 2019-05-19 VITALS — BP 118/80 | HR 65 | Temp 97.5°F | Resp 23 | Ht 68.0 in | Wt 186.0 lb

## 2019-05-19 DIAGNOSIS — K2970 Gastritis, unspecified, without bleeding: Secondary | ICD-10-CM | POA: Diagnosis not present

## 2019-05-19 DIAGNOSIS — K2950 Unspecified chronic gastritis without bleeding: Secondary | ICD-10-CM

## 2019-05-19 DIAGNOSIS — R112 Nausea with vomiting, unspecified: Secondary | ICD-10-CM

## 2019-05-19 DIAGNOSIS — R1010 Upper abdominal pain, unspecified: Secondary | ICD-10-CM

## 2019-05-19 MED ORDER — SODIUM CHLORIDE 0.9 % IV SOLN: 500.0000 mL | Freq: Once | INTRAVENOUS | Status: DC

## 2019-05-19 NOTE — Patient Instructions (Signed)
YOU HAD AN ENDOSCOPIC PROCEDURE TODAY AT THE [facility]:   Refer to the procedure report that was given to you for any specific questions about what was found during the examination.  If the procedure report does not answer your questions, please call your gastroenterologist to clarify.  If you requested that your care partner not be given the details of your procedure findings, then the procedure report has been included in a sealed envelope for you to review at your convenience later.  **Handout given on Gastritis**  YOU SHOULD EXPECT: Some feelings of bloating in the abdomen. Passage of more gas than usual.  Walking can help get rid of the air that was put into your GI tract during the procedure and reduce the bloating. If you had a lower endoscopy (such as a colonoscopy or flexible sigmoidoscopy) you may notice spotting of blood in your stool or on the toilet paper. If you underwent a bowel prep for your procedure, you may not have a normal bowel movement for a few days.  Please Note:  You might notice some irritation and congestion in your nose or some drainage.  This is from the oxygen used during your procedure.  There is no need for concern and it should clear up in a day or so.  SYMPTOMS TO REPORT IMMEDIATELY:   Following upper endoscopy (EGD)  Vomiting of blood or coffee ground material  New chest pain or pain under the shoulder blades  Painful or persistently difficult swallowing  New shortness of breath  Fever of 100F or higher  Black, tarry-looking stools  For urgent or emergent issues, a gastroenterologist can be reached at any hour by calling [phone].   DIET:  We do recommend a small meal at first, but then you may proceed to your regular diet.  Drink plenty of fluids but you should avoid alcoholic beverages for 24 hours.  ACTIVITY:  You should plan to take it easy for the rest of today and you should NOT DRIVE or use heavy machinery until tomorrow  (because of the sedation medicines used during the test).    FOLLOW UP: Our staff will call the number listed on your records 48-72 hours following your procedure to check on you and address any questions or concerns that you may have regarding the information given to you following your procedure. If we do not reach you, we will leave a message.  We will attempt to reach you two times.  During this call, we will ask if you have developed any symptoms of COVID 19. If you develop any symptoms (ie: fever, flu-like symptoms, shortness of breath, cough etc.) before then, please call [phone].  If you test positive for Covid 19 in the 2 weeks post procedure, please call and report this information to us.    If any biopsies were taken you will be contacted by phone or by letter within the next 1-3 weeks.  Please call us at [phone] if you have not heard about the biopsies in 3 weeks.    SIGNATURES/CONFIDENTIALITY: You and/or your care partner have signed paperwork which will be entered into your electronic medical record.  These signatures attest to the fact that that the information above on your After Visit Summary has been reviewed and is understood.  Full responsibility of the confidentiality of this discharge information lies with you and/or your care-partner. 

## 2019-05-19 NOTE — Progress Notes (Signed)
Called to room to assist during endoscopic procedure.  Patient ID and intended procedure confirmed with present staff. Received instructions for my participation in the procedure from the performing physician.  

## 2019-05-19 NOTE — Op Note (Signed)
[facility] Patient Name: Shannon Stuart Procedure Date: 05/19/2019 3:21 PM MRN: [id number] Endoscopist: Milus Banister , MD Age: 59 Referring MD:  Date of Birth: [birth year] Gender: Female Account #: [id number] Procedure:                Upper GI endoscopy Indications:              Epigastric abdominal pain, Nausea with vomiting;                            all improved in past 2-3 weeks since cutting back                            NSAIDs, increasing PPI Medicines:                Monitored Anesthesia Care Procedure:                Pre-Anesthesia Assessment:                           - Prior to the procedure, a History and Physical                            was performed, and patient medications and                            allergies were reviewed. The patient's tolerance of                            previous anesthesia was also reviewed. The risks                            and benefits of the procedure and the sedation                            options and risks were discussed with the patient.                            All questions were answered, and informed consent                            was obtained. Prior Anticoagulants: The patient has                            taken no previous anticoagulant or antiplatelet                            agents. ASA Grade Assessment: II - A patient with                            mild systemic disease. After reviewing the risks                            and benefits, the patient was deemed in  satisfactory condition to undergo the procedure.                           After obtaining informed consent, the endoscope was                            passed under direct vision. Throughout the                            procedure, the patient's blood pressure, pulse, and                            oxygen saturations were monitored continuously. The                            Endoscope was introduced through  the mouth, and                            advanced to the second part of duodenum. The upper                            GI endoscopy was accomplished without difficulty.                            The patient tolerated the procedure well. Scope In: Scope Out: Findings:                 Mild inflammation characterized by erythema and                            granularity was found in the gastric antrum.                            Biopsies were taken with a cold forceps for                            histology.                           The exam was otherwise without abnormality. Complications:            No immediate complications. Estimated blood loss:                            None. Estimated Blood Loss:     Estimated blood loss: none. Impression:               - Mild, non-specific gastritis. Biposied to check                            for H. pylori.                           - The examination was otherwise normal. Recommendation:           - Patient has a contact number available for  emergencies. The signs and symptoms of potential                            delayed complications were discussed with the                            patient. Return to normal activities tomorrow.                            Written discharge instructions were provided to the                            patient.                           - Resume previous diet.                           - Continue present medications.                           - Await pathology results. Milus Banister, MD 05/19/2019 3:30:50 PM This report has been signed electronically.

## 2019-05-19 NOTE — Progress Notes (Signed)
Temperature taken by L.C., VS taken by D.T. 

## 2019-05-19 NOTE — Progress Notes (Signed)
Pt tolerated well. VSS. Awake and to recovery. 

## 2019-05-20 ENCOUNTER — Ambulatory Visit (HOSPITAL_COMMUNITY)
Admission: RE | Admit: 2019-05-20 | Discharge: 2019-05-20 | Disposition: A | Discharge status: Home / Self Care | Payer: Medicare HMO | Source: Ambulatory Visit | Attending: Hematology & Oncology | Admitting: Hematology & Oncology

## 2019-05-20 DIAGNOSIS — M899 Disorder of bone, unspecified: Secondary | ICD-10-CM | POA: Insufficient documentation

## 2019-05-20 DIAGNOSIS — R634 Abnormal weight loss: Secondary | ICD-10-CM | POA: Diagnosis present

## 2019-05-20 DIAGNOSIS — G43A1 Cyclical vomiting, intractable: Secondary | ICD-10-CM | POA: Insufficient documentation

## 2019-05-20 MED ORDER — GADOBUTROL 1 MMOL/ML IV SOLN
9.0000 mL | Freq: Once | INTRAVENOUS | Status: AC | PRN
  Administered 2019-05-20: 9 mL via INTRAVENOUS

## 2019-05-21 ENCOUNTER — Telehealth: Payer: Self-pay

## 2019-05-21 NOTE — Telephone Encounter (Signed)
  Follow up Call-  Call back number 05/19/2019  Post procedure Call Back phone  # [phone]  Permission to leave phone message Yes  Some recent data might be hidden     Patient questions:  Do you have a fever, pain , or abdominal swelling? No. Pain Score  0 *  Have you tolerated food without any problems? Yes.    Have you been able to return to your normal activities? Yes.    Do you have any questions about your discharge instructions: Diet   No. Medications  No. Follow up visit  No.  Do you have questions or concerns about your Care? No.  Actions: * If pain score is 4 or above: No action needed, pain <4.   1. Have you developed a fever since your procedure? No  2.   Have you had an respiratory symptoms (SOB or cough) since your procedure? No  3.   Have you tested positive for COVID 19 since your procedure No  4.   Have you had any family members/close contacts diagnosed with the COVID 19 since your procedure?  No   If yes to any of these questions please route to Joylene John, RN and Alphonsa Gin, RN.

## 2019-05-21 NOTE — Telephone Encounter (Signed)
1st follow up call made.  NAULM 

## 2019-05-24 ENCOUNTER — Other Ambulatory Visit: Payer: Self-pay | Admitting: Radiology

## 2019-05-25 ENCOUNTER — Ambulatory Visit (HOSPITAL_COMMUNITY)
Admission: RE | Admit: 2019-05-25 | Discharge: 2019-05-25 | Disposition: A | Discharge status: Home / Self Care | Payer: Medicare HMO | Source: Ambulatory Visit | Attending: Hematology & Oncology | Admitting: Hematology & Oncology

## 2019-05-25 ENCOUNTER — Encounter (HOSPITAL_COMMUNITY): Payer: Self-pay

## 2019-05-25 ENCOUNTER — Other Ambulatory Visit: Payer: Self-pay

## 2019-05-25 ENCOUNTER — Encounter: Payer: Self-pay | Admitting: Gastroenterology

## 2019-05-25 ENCOUNTER — Telehealth: Payer: Self-pay | Admitting: *Deleted

## 2019-05-25 DIAGNOSIS — Z79899 Other long term (current) drug therapy: Secondary | ICD-10-CM | POA: Diagnosis not present

## 2019-05-25 DIAGNOSIS — K219 Gastro-esophageal reflux disease without esophagitis: Secondary | ICD-10-CM | POA: Insufficient documentation

## 2019-05-25 DIAGNOSIS — I10 Essential (primary) hypertension: Secondary | ICD-10-CM | POA: Insufficient documentation

## 2019-05-25 DIAGNOSIS — Z87891 Personal history of nicotine dependence: Secondary | ICD-10-CM | POA: Insufficient documentation

## 2019-05-25 DIAGNOSIS — Z7982 Long term (current) use of aspirin: Secondary | ICD-10-CM | POA: Diagnosis not present

## 2019-05-25 DIAGNOSIS — D7589 Other specified diseases of blood and blood-forming organs: Secondary | ICD-10-CM | POA: Diagnosis not present

## 2019-05-25 DIAGNOSIS — I2510 Atherosclerotic heart disease of native coronary artery without angina pectoris: Secondary | ICD-10-CM | POA: Insufficient documentation

## 2019-05-25 DIAGNOSIS — K760 Fatty (change of) liver, not elsewhere classified: Secondary | ICD-10-CM | POA: Insufficient documentation

## 2019-05-25 DIAGNOSIS — Z8249 Family history of ischemic heart disease and other diseases of the circulatory system: Secondary | ICD-10-CM | POA: Insufficient documentation

## 2019-05-25 DIAGNOSIS — M899 Disorder of bone, unspecified: Secondary | ICD-10-CM

## 2019-05-25 DIAGNOSIS — Z9049 Acquired absence of other specified parts of digestive tract: Secondary | ICD-10-CM | POA: Insufficient documentation

## 2019-05-25 LAB — CBC WITH DIFFERENTIAL/PLATELET
Abs Immature Granulocytes: 0.03 10*3/uL (ref 0.00–0.07)
Basophils Absolute: 0 10*3/uL (ref 0.0–0.1)
Basophils Relative: 1 %
Eosinophils Absolute: 0.2 10*3/uL (ref 0.0–0.5)
Eosinophils Relative: 2 %
HCT: 37.3 % (ref 36.0–46.0)
Hemoglobin: 12.1 g/dL (ref 12.0–15.0)
Immature Granulocytes: 0 %
Lymphocytes Relative: 34 %
Lymphs Abs: 2.6 10*3/uL (ref 0.7–4.0)
MCH: 29.2 pg (ref 26.0–34.0)
MCHC: 32.4 g/dL (ref 30.0–36.0)
MCV: 89.9 fL (ref 80.0–100.0)
Monocytes Absolute: 0.5 10*3/uL (ref 0.1–1.0)
Monocytes Relative: 7 %
Neutro Abs: 4.3 10*3/uL (ref 1.7–7.7)
Neutrophils Relative %: 56 %
Platelets: 205 10*3/uL (ref 150–400)
RBC: 4.15 MIL/uL (ref 3.87–5.11)
RDW: 16.5 % — ABNORMAL HIGH (ref 11.5–15.5)
WBC: 7.6 10*3/uL (ref 4.0–10.5)
nRBC: 0 % (ref 0.0–0.2)

## 2019-05-25 LAB — PROTIME-INR
INR: 0.9 (ref 0.8–1.2)
Prothrombin Time: 12.2 seconds (ref 11.4–15.2)

## 2019-05-25 MED ORDER — MIDAZOLAM HCL 2 MG/2ML IJ SOLN
INTRAMUSCULAR | Status: AC
  Filled 2019-05-25: qty 4

## 2019-05-25 MED ORDER — FENTANYL CITRATE (PF) 100 MCG/2ML IJ SOLN
INTRAMUSCULAR | Status: AC | PRN
  Administered 2019-05-25 (×2): 50 ug via INTRAVENOUS

## 2019-05-25 MED ORDER — SODIUM CHLORIDE 0.9 % IV SOLN: INTRAVENOUS | Status: DC

## 2019-05-25 MED ORDER — FENTANYL CITRATE (PF) 100 MCG/2ML IJ SOLN
INTRAMUSCULAR | Status: AC
  Filled 2019-05-25: qty 2

## 2019-05-25 MED ORDER — LIDOCAINE HCL (PF) 1 % IJ SOLN
INTRAMUSCULAR | Status: AC | PRN
  Administered 2019-05-25: 10 mL

## 2019-05-25 MED ORDER — MIDAZOLAM HCL 2 MG/2ML IJ SOLN
INTRAMUSCULAR | Status: AC | PRN
  Administered 2019-05-25 (×4): 1 mg via INTRAVENOUS
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
## 2019-05-25 NOTE — Telephone Encounter (Signed)
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  I am not sure what this means but will call a neurosurgeon to look at the MRI.  I hope you do not need surgery for this!!  Please get Dr. Earle Gell on the phone for me!!  Shannon Stuart

## 2019-05-25 NOTE — Procedures (Signed)
Interventional Radiology Procedure:   Indications: Nonspecific bone lesions  Procedure: CT guided bone marrow biopsy  Findings: 2 aspirates and 1 core from right ilium  Complications: None     EBL: Minimal, less than 10 ml  Plan: Discharge to home in one hour.   Baran Kuhrt R. Anselm Pancoast, MD  Pager: [phone]

## 2019-05-25 NOTE — Discharge Instructions (Signed)
Bone Marrow Aspiration and Bone Marrow Biopsy, Adult, Care After This sheet gives you information about how to care for yourself after your procedure. Your health care provider may also give you more specific instructions. If you have problems or questions, contact your health care provider. What can I expect after the procedure? After the procedure, it is common to have:  Mild pain and tenderness.  Swelling.  Bruising. Follow these instructions at home: Puncture site care   Follow instructions from your health care provider about how to take care of the puncture site. Make sure you: ? Wash your hands with soap and water before and after you change your bandage (dressing). If soap and water are not available, use hand sanitizer. ? Change your dressing as told by your health care provider.  Check your puncture site every day for signs of infection. Check for: ? More redness, swelling, or pain. ? Fluid or blood. ? Warmth. ? Pus or a bad smell. Activity  Return to your normal activities as told by your health care provider. Ask your health care provider what activities are safe for you.  Do not lift anything that is heavier than 10 lb (4.5 kg), or the limit that you are told, until your health care provider says that it is safe.  Do not drive for 24 hours if you were given a sedative during your procedure. General instructions   Take over-the-counter and prescription medicines only as told by your health care provider.  Do not take baths, swim, or use a hot tub until your health care provider approves. Ask your health care provider if you may take showers. You may only be allowed to take sponge baths.  If directed, put ice on the affected area. To do this: ? Put ice in a plastic bag. ? Place a towel between your skin and the bag. ? Leave the ice on for 20 minutes, 2-3 times a day.  Keep all follow-up visits as told by your health care provider. This is important. Contact a  health care provider if:  Your pain is not controlled with medicine.  You have a fever.  You have more redness, swelling, or pain around the puncture site.  You have fluid or blood coming from the puncture site.  Your puncture site feels warm to the touch.  You have pus or a bad smell coming from the puncture site. Summary  After the procedure, it is common to have mild pain, tenderness, swelling, and bruising.  Follow instructions from your health care provider about how to take care of the puncture site and what activities are safe for you.  Take over-the-counter and prescription medicines only as told by your health care provider.  Contact a health care provider if you have any signs of infection, such as fluid or blood coming from the puncture site. This information is not intended to replace advice given to you by your health care provider. Make sure you discuss any questions you have with your health care provider. Document Revised: 07/28/2018 Document Reviewed: 07/28/2018 Elsevier Patient Education  [place]. Moderate Conscious Sedation, Adult, Care After These instructions provide you with information about caring for yourself after your procedure. Your health care provider may also give you more specific instructions. Your treatment has been planned according to current medical practices, but problems sometimes occur. Call your health care provider if you have any problems or questions after your procedure. What can I expect after the procedure? After your procedure,  it is common:  To feel sleepy for several hours.  To feel clumsy and have poor balance for several hours.  To have poor judgment for several hours.  To vomit if you eat too soon. Follow these instructions at home: For at least 24 hours after the procedure:   Do not: ? Participate in activities where you could fall or become injured. ? Drive. ? Use heavy machinery. ? Drink alcohol. ? Take  sleeping pills or medicines that cause drowsiness. ? Make important decisions or sign legal documents. ? Take care of children on your own.  Rest. Eating and drinking  Follow the diet recommended by your health care provider.  If you vomit: ? Drink water, juice, or soup when you can drink without vomiting. ? Make sure you have little or no nausea before eating solid foods. General instructions  Have a responsible adult stay with you until you are awake and alert.  Take over-the-counter and prescription medicines only as told by your health care provider.  If you smoke, do not smoke without supervision.  Keep all follow-up visits as told by your health care provider. This is important. Contact a health care provider if:  You keep feeling nauseous or you keep vomiting.  You feel light-headed.  You develop a rash.  You have a fever. Get help right away if:  You have trouble breathing. This information is not intended to replace advice given to you by your health care provider. Make sure you discuss any questions you have with your health care provider. Document Revised: 02/21/2017 Document Reviewed: 07/01/2015 Elsevier Patient Education  2020 [employer].

## 2019-05-25 NOTE — Telephone Encounter (Signed)
No message left.  Voicemail full.

## 2019-05-26 ENCOUNTER — Other Ambulatory Visit: Payer: Self-pay

## 2019-05-27 ENCOUNTER — Encounter: Payer: Self-pay | Admitting: *Deleted

## 2019-05-27 LAB — SURGICAL PATHOLOGY

## 2019-05-28 ENCOUNTER — Telehealth: Payer: Self-pay | Admitting: *Deleted

## 2019-05-28 NOTE — Telephone Encounter (Signed)
As noted below by Dr. Marin Olp, I tried calling the patient but her voice mail box is full. Patient needs to know, that the bone marrow is normal. There is NO cancer, leukemia or lymphoma.

## 2019-05-28 NOTE — Telephone Encounter (Signed)
-----   Message from [name], MD sent at 05/27/2019  2:48 PM EST ----- Call - the bone marrow is normal!!!  NO cancer or leukemia or lymphoma!!!  This is great news!!!  Shannon Stuart

## 2019-06-01 ENCOUNTER — Encounter (HOSPITAL_COMMUNITY): Payer: Self-pay | Admitting: Hematology & Oncology

## 2019-06-02 LAB — SURGICAL PATHOLOGY

## 2019-06-05 ENCOUNTER — Other Ambulatory Visit: Payer: Self-pay | Admitting: Anesthesiology

## 2019-06-05 DIAGNOSIS — M5416 Radiculopathy, lumbar region: Secondary | ICD-10-CM

## 2019-06-07 ENCOUNTER — Telehealth: Payer: Self-pay | Admitting: *Deleted

## 2019-06-07 NOTE — Telephone Encounter (Signed)
Patient called frustrated that she still feels badly but all tests are coming back normal.  Feels as though there is something going on with her that is not being found.  Reviewed bone marrow results, CT scan, PET scan and MRI results.  Talked with Dr. Marin Olp who said he could see patient again in consultation to discuss this.  Message sent to Scheduler to set this up.

## 2019-06-08 ENCOUNTER — Telehealth: Payer: Self-pay

## 2019-06-08 NOTE — Telephone Encounter (Signed)
-----   Message from Milus Banister, MD sent at 06/08/2019  8:22 AM EDT ----- Shannon Stuart, Thank you for bringing this up.  I must have reviewed this without sending a proper results note.  Probably best to turn this into a permanent note for records.  Thanks  Shannon Stuart, Can you please contact her and let her know that the biopsies from her stomach showed no sign of infection or cancer.  No changes to her regimen since she was already feeling better by the time she had her procedure.  Thanks   ----- Message ----- From: Alberteen Sam, RN Sent: 06/07/2019  11:48 AM EDT To: Milus Banister, MD  Not sure if you notified patient, I did not see a note or letter. Thanks!!

## 2019-06-08 NOTE — Telephone Encounter (Signed)
The patient has been notified of this information and all questions answered.

## 2019-06-17 ENCOUNTER — Encounter: Payer: Self-pay | Admitting: Hematology & Oncology

## 2019-06-17 ENCOUNTER — Ambulatory Visit (HOSPITAL_BASED_OUTPATIENT_CLINIC_OR_DEPARTMENT_OTHER)
Admission: RE | Admit: 2019-06-17 | Discharge: 2019-06-17 | Disposition: A | Discharge status: Home / Self Care | Payer: Medicare HMO | Source: Ambulatory Visit | Attending: Hematology & Oncology | Admitting: Hematology & Oncology

## 2019-06-17 ENCOUNTER — Other Ambulatory Visit: Payer: Self-pay | Admitting: Hematology & Oncology

## 2019-06-17 ENCOUNTER — Inpatient Hospital Stay: Payer: Medicare HMO

## 2019-06-17 ENCOUNTER — Inpatient Hospital Stay: Payer: Medicare HMO | Attending: Hematology & Oncology | Admitting: Hematology & Oncology

## 2019-06-17 ENCOUNTER — Other Ambulatory Visit: Payer: Self-pay

## 2019-06-17 VITALS — BP 120/83 | HR 82 | Temp 97.1°F | Resp 18 | Ht 68.0 in | Wt 191.4 lb

## 2019-06-17 DIAGNOSIS — D329 Benign neoplasm of meninges, unspecified: Secondary | ICD-10-CM | POA: Insufficient documentation

## 2019-06-17 DIAGNOSIS — M25552 Pain in left hip: Secondary | ICD-10-CM

## 2019-06-17 DIAGNOSIS — R634 Abnormal weight loss: Secondary | ICD-10-CM | POA: Insufficient documentation

## 2019-06-17 DIAGNOSIS — Z79899 Other long term (current) drug therapy: Secondary | ICD-10-CM | POA: Diagnosis not present

## 2019-06-17 DIAGNOSIS — D72823 Leukemoid reaction: Secondary | ICD-10-CM

## 2019-06-17 LAB — CBC WITH DIFFERENTIAL (CANCER CENTER ONLY)
Abs Immature Granulocytes: 0.02 10*3/uL (ref 0.00–0.07)
Basophils Absolute: 0 10*3/uL (ref 0.0–0.1)
Basophils Relative: 0 %
Eosinophils Absolute: 0.2 10*3/uL (ref 0.0–0.5)
Eosinophils Relative: 3 %
HCT: 38.1 % (ref 36.0–46.0)
Hemoglobin: 12 g/dL (ref 12.0–15.0)
Immature Granulocytes: 0 %
Lymphocytes Relative: 38 %
Lymphs Abs: 2.9 10*3/uL (ref 0.7–4.0)
MCH: 27.6 pg (ref 26.0–34.0)
MCHC: 31.5 g/dL (ref 30.0–36.0)
MCV: 87.8 fL (ref 80.0–100.0)
Monocytes Absolute: 0.4 10*3/uL (ref 0.1–1.0)
Monocytes Relative: 6 %
Neutro Abs: 4.1 10*3/uL (ref 1.7–7.7)
Neutrophils Relative %: 53 %
Platelet Count: 208 10*3/uL (ref 150–400)
RBC: 4.34 MIL/uL (ref 3.87–5.11)
RDW: 16.5 % — ABNORMAL HIGH (ref 11.5–15.5)
WBC Count: 7.8 10*3/uL (ref 4.0–10.5)
nRBC: 0 % (ref 0.0–0.2)

## 2019-06-17 LAB — CMP (CANCER CENTER ONLY)
ALT: 13 U/L (ref 0–44)
AST: 15 U/L (ref 15–41)
Albumin: 4.4 g/dL (ref 3.5–5.0)
Alkaline Phosphatase: 85 U/L (ref 38–126)
Anion gap: 7 (ref 5–15)
BUN: 18 mg/dL (ref 6–20)
CO2: 29 mmol/L (ref 22–32)
Calcium: 9.3 mg/dL (ref 8.9–10.3)
Chloride: 105 mmol/L (ref 98–111)
Creatinine: 1.09 mg/dL — ABNORMAL HIGH (ref 0.44–1.00)
GFR, Est AFR Am: >60 mL/min (ref >60)
GFR, Estimated: 55 mL/min — ABNORMAL LOW (ref >60)
Glucose, Bld: 147 mg/dL — ABNORMAL HIGH (ref 70–99)
Potassium: 4.3 mmol/L (ref 3.5–5.1)
Sodium: 141 mmol/L (ref 135–145)
Total Bilirubin: 0.4 mg/dL (ref 0.3–1.2)
Total Protein: 7 g/dL (ref 6.5–8.1)

## 2019-06-17 LAB — TSH: TSH: 0.705 u[IU]/mL (ref 0.308–3.960)

## 2019-06-17 LAB — LACTATE DEHYDROGENASE: LDH: 180 U/L (ref 98–192)

## 2019-06-17 NOTE — Progress Notes (Signed)
Hematology and Oncology Follow Up Visit  Shannon Stuart [id number] 08-23-1960 58 y.o. 06/17/2019   Principle Diagnosis:   Weight loss, lack of appetite, left hip pain  Current Therapy:    Observation     Interim History:  Shannon Stuart is back for second office visit.  We first saw her a month ago.  At that time, it was not clear whether there was any malignancy.  We did a thorough evaluation on her.  We ultimately had a bone marrow biopsy done.  This was done on 05/27/2019.  The pathology report ([id number]) showed normocellular marrow.  There is no evidence of leukemia or lymphoma.  Flow cytometry was unremarkable.  Cytogenetics were normal.  She still is losing weight.  She does not have an appetite.  Her main problem seems to be the left hip.  This is quite painful.  She has had no trauma to the hip.  We will get plain films of the hip and probably an MRI.  She is just frustrated with not being able to eat and losing weight.  She has had no obvious change in bowel or bladder habits.  Her lab work looked okay.  We did check a thyroid on her.  Her TSH was 0.7.  Of note, she did have an MRI of the brain.  This shows that there is a 12 mm meningioma.  I talked to her neurosurgeon about this.  He said this was something that did not need to be removed.  He would favor following this up in about 6 months.  She is complaining of some unsteadiness.  Again I am not sure exactly what is going on with this.  She is had no falling.  She does feels a little bit unsteady on her feet.  She has not seen neurology.  I would have to say that overall, her performance status is probably ECOG 1.  Medications:  Current Outpatient Medications:  .  ALPRAZolam (XANAX) 1 MG tablet, Take 1 mg by mouth 3 (three) times daily. , Disp: , Rfl:  .  amLODipine (NORVASC) 10 MG tablet, Take 1 tablet (10 mg total) by mouth daily., Disp: 90 tablet, Rfl: 3 .  aspirin EC 81 MG tablet, Take 81 mg by mouth daily.,  Disp: , Rfl:  .  atorvastatin (LIPITOR) 10 MG tablet, Take 10 mg by mouth daily at 6 PM. , Disp: , Rfl:  .  capsaicin (ZOSTRIX) 0.025 % cream, Apply topically 2 (two) times daily. Apply to abdomen (Patient taking differently: Apply 1 application topically 2 (two) times daily as needed (pain). Apply to abdomen), Disp: 60 g, Rfl: 0 .  cholecalciferol (VITAMIN D3) 25 MCG (1000 UNIT) tablet, Take 3,000 Units by mouth daily., Disp: , Rfl:  .  cyclobenzaprine (FLEXERIL) 10 MG tablet, TAKE 1 TABLET BY MOUTH THREE TIMES A DAY AS NEEDED FOR MUSCLE SPASMS (Patient taking differently: Take 10 mg by mouth 3 (three) times daily as needed for muscle spasms. ), Disp: 90 tablet, Rfl: 2 .  diclofenac sodium (VOLTAREN) 1 % GEL, Apply 1 application topically daily as needed (pain)., Disp: , Rfl:  .  HYDROcodone-acetaminophen (NORCO/VICODIN) 5-325 MG tablet, Take 1-2 tablets by mouth every 4 (four) hours as needed for moderate pain. , Disp: , Rfl:  .  ibuprofen (ADVIL,MOTRIN) 800 MG tablet, TAKE 1 TABLET BY MOUTH EVERY 8 HOURS AS NEEDED FOR MODERATE PAIN (Patient taking differently: Take 800 mg by mouth 3 (three) times daily. ), Disp: 90 tablet, Rfl:  0 .  lisinopril (PRINIVIL,ZESTRIL) 40 MG tablet, Take 1 tablet (40 mg total) by mouth daily., Disp: 30 tablet, Rfl: 11 .  Omega-3 Fatty Acids (FISH OIL) 1000 MG CAPS, Take 1 capsule by mouth daily., Disp: , Rfl:  .  pantoprazole (PROTONIX) 40 MG tablet, Take 1 tablet (40 mg total) by mouth daily., Disp: 90 tablet, Rfl: 3 .  Potassium 99 MG TABS, Take 1 tablet by mouth See admin instructions. Take on Mondays and Fridays, Disp: , Rfl:  .  promethazine (PHENERGAN) 25 MG suppository, Use 1 suppository every 6-8 hours as needed, Disp: 12 each, Rfl: 0 .  promethazine (PHENERGAN) 25 MG tablet, Take 1 tablet (25 mg total) by mouth every 6 (six) hours as needed for nausea., Disp: 10 tablet, Rfl: 0 .  traZODone (DESYREL) 100 MG tablet, Take 1 tablet by mouth at bedtime., Disp: , Rfl:   .  zolpidem (AMBIEN) 5 MG tablet, Take 5 mg by mouth at bedtime as needed., Disp: , Rfl:   Allergies:  Allergies  Allergen Reactions  . Morphine And Related Other (See Comments)    Makes "loopy" and chest "feel funny".   Tresa Moore Hcl] Nausea And Vomiting    Past Medical History, Surgical history, Social history, and Family History were reviewed and updated.  Review of Systems: Review of Systems  Constitutional: Positive for fatigue and unexpected weight change.  HENT:  Negative.   Eyes: Negative.   Respiratory: Negative.   Cardiovascular: Negative.   Gastrointestinal: Positive for nausea.  Endocrine: Negative.   Genitourinary: Negative.    Musculoskeletal: Positive for back pain and gait problem.  Neurological: Positive for gait problem.  Hematological: Negative.   Psychiatric/Behavioral: Negative.     Physical Exam:  height is _0  (1.727 m) and weight is 191 lb 6.4 oz (86.8 kg). Her temporal temperature is 97.1 F (36.2 C) (abnormal). Her blood pressure is 120/83 and her pulse is 82. Her respiration is 18 and oxygen saturation is 100%.   Wt Readings from Last 3 Encounters:  06/17/19 191 lb 6.4 oz (86.8 kg)  05/25/19 197 lb (89.4 kg)  05/19/19 186 lb (84.4 kg)    Physical Exam Vitals reviewed.  HENT:     Head: Normocephalic and atraumatic.  Eyes:     Pupils: Pupils are equal, round, and reactive to light.  Cardiovascular:     Rate and Rhythm: Normal rate and regular rhythm.     Heart sounds: Normal heart sounds.  Pulmonary:     Effort: Pulmonary effort is normal.     Breath sounds: Normal breath sounds.  Abdominal:     General: Bowel sounds are normal.     Palpations: Abdomen is soft.  Musculoskeletal:        General: No tenderness or deformity. Normal range of motion.     Cervical back: Normal range of motion.     Comments: Her left hip is definitely tender to palpation.  She has decreased range of motion of the left hip.  Lymphadenopathy:      Cervical: No cervical adenopathy.  Skin:    General: Skin is warm and dry.     Findings: No erythema or rash.  Neurological:     Mental Status: She is alert and oriented to person, place, and time.  Psychiatric:        Behavior: Behavior normal.        Thought Content: Thought content normal.        Judgment: Judgment normal.  Lab Results  Component Value Date   WBC 7.8 06/17/2019   HGB 12.0 06/17/2019   HCT 38.1 06/17/2019   MCV 87.8 06/17/2019   PLT 208 06/17/2019     Chemistry      Component Value Date/Time   NA 141 06/17/2019 0951   NA 139 12/08/2018 1630   NA 141 12/20/2016 1017   K 4.3 06/17/2019 0951   K 3.4 (L) 12/20/2016 1017   CL 105 06/17/2019 0951   CO2 29 06/17/2019 0951   CO2 27 12/20/2016 1017   BUN 18 06/17/2019 0951   BUN 21 12/08/2018 1630   BUN 12.0 12/20/2016 1017   CREATININE 1.09 (H) 06/17/2019 0951   CREATININE 1.2 (H) 12/20/2016 1017      Component Value Date/Time   CALCIUM 9.3 06/17/2019 0951   CALCIUM 8.6 12/20/2016 1017   ALKPHOS 85 06/17/2019 0951   ALKPHOS 71 12/20/2016 1017   AST 15 06/17/2019 0951   AST 49 (H) 12/20/2016 1017   ALT 13 06/17/2019 0951   ALT 42 12/20/2016 1017   BILITOT 0.4 06/17/2019 0951   BILITOT 0.54 12/20/2016 1017       Impression and Plan: Shannon Stuart is a very nice 59 year old African-American female.  Thankfully, I just do not see that there is cancer involved with her.  We will have to see what is going on with the left hip.  This might be arthritis.  I think she probably needs to see an orthopedic surgeon.   Again I am not sure why she is having the unsteadiness.  Again this meningioma should not be causing problems according to her neurosurgeon.  I suspect that she probably will have to see a neurologist.  Thankfully, I do not see that we have to embark upon a cancer search.  She has had a very thorough work-up to date.  This is quite frustrating for Shannon Stuart.  I really feel bad for her.  I just  want her quality of life to be better.  This is very complicated.  We spent about 40 minutes with her today.  We will see what the plain films of the left hip show.  We will see what the MRI of the hip shows.  We will then have to see about getting her over to orthopedic surgery and possibly neurology.  I probably will plan to get her back to see [place] in about 2 months.   [name], MD 3/25/20211:29 PM

## 2019-06-18 ENCOUNTER — Encounter: Payer: Self-pay | Admitting: *Deleted

## 2019-06-18 ENCOUNTER — Telehealth: Payer: Self-pay | Admitting: Hematology & Oncology

## 2019-06-18 LAB — IGG, IGA, IGM
IgA: 119 mg/dL (ref 87–352)
IgG (Immunoglobin G), Serum: 834 mg/dL (ref 586–1602)
IgM (Immunoglobulin M), Srm: 70 mg/dL (ref 26–217)

## 2019-06-18 LAB — TESTOSTERONE: Testosterone: <3 ng/dL — ABNORMAL LOW (ref 3–41)

## 2019-06-18 NOTE — Progress Notes (Signed)
MRI hip canceled per order of Dr. Marin Olp.  Pt notified.

## 2019-06-18 NOTE — Telephone Encounter (Signed)
Called and spoke with patient regarding appointments added per 3/25 los

## 2019-06-30 ENCOUNTER — Other Ambulatory Visit (HOSPITAL_COMMUNITY): Payer: Medicare HMO

## 2019-07-10 ENCOUNTER — Other Ambulatory Visit: Payer: Medicare HMO

## 2019-07-13 ENCOUNTER — Other Ambulatory Visit: Payer: Medicare HMO

## 2019-08-04 ENCOUNTER — Ambulatory Visit
Admission: RE | Admit: 2019-08-04 | Discharge: 2019-08-04 | Disposition: A | Discharge status: Home / Self Care | Payer: Medicare HMO | Source: Ambulatory Visit | Attending: Anesthesiology | Admitting: Anesthesiology

## 2019-08-04 ENCOUNTER — Other Ambulatory Visit: Payer: Self-pay

## 2019-08-04 DIAGNOSIS — M5416 Radiculopathy, lumbar region: Secondary | ICD-10-CM

## 2019-08-04 MED ORDER — GADOBENATE DIMEGLUMINE 529 MG/ML IV SOLN
20.0000 mL | Freq: Once | INTRAVENOUS | Status: AC | PRN
  Administered 2019-08-04: 20 mL via INTRAVENOUS

## 2019-08-09 ENCOUNTER — Other Ambulatory Visit: Payer: Self-pay

## 2019-08-09 ENCOUNTER — Emergency Department (HOSPITAL_COMMUNITY)
Admission: EM | Admit: 2019-08-09 | Discharge: 2019-08-10 | Disposition: A | Discharge status: Home / Self Care | Payer: Medicare HMO | Attending: Emergency Medicine | Admitting: Emergency Medicine

## 2019-08-09 ENCOUNTER — Encounter (HOSPITAL_COMMUNITY): Payer: Self-pay

## 2019-08-09 DIAGNOSIS — R112 Nausea with vomiting, unspecified: Secondary | ICD-10-CM | POA: Diagnosis not present

## 2019-08-09 DIAGNOSIS — F1290 Cannabis use, unspecified, uncomplicated: Secondary | ICD-10-CM | POA: Diagnosis not present

## 2019-08-09 DIAGNOSIS — Z7982 Long term (current) use of aspirin: Secondary | ICD-10-CM | POA: Diagnosis not present

## 2019-08-09 DIAGNOSIS — R1010 Upper abdominal pain, unspecified: Secondary | ICD-10-CM | POA: Insufficient documentation

## 2019-08-09 DIAGNOSIS — I10 Essential (primary) hypertension: Secondary | ICD-10-CM | POA: Insufficient documentation

## 2019-08-09 DIAGNOSIS — I2510 Atherosclerotic heart disease of native coronary artery without angina pectoris: Secondary | ICD-10-CM | POA: Diagnosis not present

## 2019-08-09 DIAGNOSIS — Z87891 Personal history of nicotine dependence: Secondary | ICD-10-CM | POA: Diagnosis not present

## 2019-08-09 DIAGNOSIS — Z79899 Other long term (current) drug therapy: Secondary | ICD-10-CM | POA: Diagnosis not present

## 2019-08-09 LAB — CBC
HCT: 44.8 % (ref 36.0–46.0)
Hemoglobin: 14.4 g/dL (ref 12.0–15.0)
MCH: 28.3 pg (ref 26.0–34.0)
MCHC: 32.1 g/dL (ref 30.0–36.0)
MCV: 88.2 fL (ref 80.0–100.0)
Platelets: 305 10*3/uL (ref 150–400)
RBC: 5.08 MIL/uL (ref 3.87–5.11)
RDW: 16.6 % — ABNORMAL HIGH (ref 11.5–15.5)
WBC: 12.4 10*3/uL — ABNORMAL HIGH (ref 4.0–10.5)
nRBC: 0 % (ref 0.0–0.2)

## 2019-08-09 LAB — URINALYSIS, ROUTINE W REFLEX MICROSCOPIC
Bilirubin Urine: NEGATIVE
Glucose, UA: NEGATIVE mg/dL
Hgb urine dipstick: NEGATIVE
Ketones, ur: 20 mg/dL — AB
Leukocytes,Ua: NEGATIVE
Nitrite: NEGATIVE
Protein, ur: >=300 mg/dL — AB
Specific Gravity, Urine: 1.022 (ref 1.005–1.030)
pH: 6 (ref 5.0–8.0)

## 2019-08-09 LAB — COMPREHENSIVE METABOLIC PANEL
ALT: 29 U/L (ref 0–44)
AST: 37 U/L (ref 15–41)
Albumin: 4.9 g/dL (ref 3.5–5.0)
Alkaline Phosphatase: 75 U/L (ref 38–126)
Anion gap: 16 — ABNORMAL HIGH (ref 5–15)
BUN: 13 mg/dL (ref 8–23)
CO2: 25 mmol/L (ref 22–32)
Calcium: 9.3 mg/dL (ref 8.9–10.3)
Chloride: 99 mmol/L (ref 98–111)
Creatinine, Ser: 0.91 mg/dL (ref 0.44–1.00)
GFR calc Af Amer: >60 mL/min (ref >60)
GFR calc non Af Amer: >60 mL/min (ref >60)
Glucose, Bld: 139 mg/dL — ABNORMAL HIGH (ref 70–99)
Potassium: 3.4 mmol/L — ABNORMAL LOW (ref 3.5–5.1)
Sodium: 140 mmol/L (ref 135–145)
Total Bilirubin: 1.5 mg/dL — ABNORMAL HIGH (ref 0.3–1.2)
Total Protein: 8.3 g/dL — ABNORMAL HIGH (ref 6.5–8.1)

## 2019-08-09 LAB — RAPID URINE DRUG SCREEN, HOSP PERFORMED
Amphetamines: NOT DETECTED
Barbiturates: NOT DETECTED
Benzodiazepines: POSITIVE — AB
Cocaine: NOT DETECTED
Opiates: NOT DETECTED
Tetrahydrocannabinol: POSITIVE — AB

## 2019-08-09 LAB — I-STAT CHEM 8, ED
BUN: 24 mg/dL — ABNORMAL HIGH (ref 8–23)
Calcium, Ion: 1.08 mmol/L — ABNORMAL LOW (ref 1.15–1.40)
Chloride: 99 mmol/L (ref 98–111)
Creatinine, Ser: 1.2 mg/dL — ABNORMAL HIGH (ref 0.44–1.00)
Glucose, Bld: 136 mg/dL — ABNORMAL HIGH (ref 70–99)
HCT: 42 % (ref 36.0–46.0)
Hemoglobin: 14.3 g/dL (ref 12.0–15.0)
Potassium: 3.2 mmol/L — ABNORMAL LOW (ref 3.5–5.1)
Sodium: 138 mmol/L (ref 135–145)
TCO2: 32 mmol/L (ref 22–32)

## 2019-08-09 LAB — TROPONIN I (HIGH SENSITIVITY)
Troponin I (High Sensitivity): 18 ng/L — ABNORMAL HIGH (ref <18)
Troponin I (High Sensitivity): 19 ng/L — ABNORMAL HIGH (ref <18)

## 2019-08-09 LAB — LIPASE, BLOOD: Lipase: 22 U/L (ref 11–51)

## 2019-08-09 MED ORDER — LACTATED RINGERS IV BOLUS
1000.0000 mL | Freq: Once | INTRAVENOUS | Status: AC
  Administered 2019-08-09: 1000 mL via INTRAVENOUS

## 2019-08-09 MED ORDER — PROCHLORPERAZINE EDISYLATE 10 MG/2ML IJ SOLN
10.0000 mg | Freq: Once | INTRAMUSCULAR | Status: AC
  Administered 2019-08-09: 10 mg via INTRAVENOUS
  Filled 2019-08-09: qty 2

## 2019-08-09 MED ORDER — POTASSIUM CHLORIDE 10 MEQ/100ML IV SOLN
10.0000 meq | Freq: Once | INTRAVENOUS | Status: AC
  Administered 2019-08-09: 10 meq via INTRAVENOUS
  Filled 2019-08-09: qty 100

## 2019-08-09 MED ORDER — LORAZEPAM 2 MG/ML IJ SOLN
1.0000 mg | Freq: Once | INTRAMUSCULAR | Status: AC
  Administered 2019-08-09: 1 mg via INTRAVENOUS
  Filled 2019-08-09: qty 1

## 2019-08-09 NOTE — ED Notes (Signed)
Attempted IV without success.

## 2019-08-09 NOTE — ED Notes (Signed)
Attempted iv x2 w/o success. IV team consult placed
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
## 2019-08-09 NOTE — ED Triage Notes (Signed)
Pt reports generalized abd pain and emesis since Sunday morning. Pt also having some epigastric pain due to the vomiting. During triage pt states she felt like she could pass out. Pt laid back in triage chair and felt a little better.

## 2019-08-10 MED ORDER — POTASSIUM CHLORIDE CRYS ER 20 MEQ PO TBCR
20.0000 meq | EXTENDED_RELEASE_TABLET | Freq: Every day | ORAL | 0 refills | Status: DC
Start: 2019-08-10 — End: 2020-08-16

## 2019-08-10 MED ORDER — POTASSIUM CHLORIDE CRYS ER 20 MEQ PO TBCR: 40.0000 meq | EXTENDED_RELEASE_TABLET | Freq: Once | ORAL | Status: DC

## 2019-08-10 NOTE — Discharge Instructions (Addendum)
It was our pleasure to provide your ER care today - we hope that you feel better.  Rest. Drink plenty of fluids.  From today's labs, your potassium level is low (3.2) - eat plenty of fruits and vegetables, take potassium supplement as prescribed, and follow up with primary care doctor in 1 week.  Note that increasingly we are seeing marijuana as a cause of a recurrent upper abdominal pain and vomiting syndrome known as cannabinoid hyperemesis syndrome - avoid marijuana/THC use will prevent those symptoms from recurring.   Follow up with primary care doctor in 1 week - also have your blood pressure rechecked then.   Return to ER if worse, new symptoms, fevers, persistent vomiting, worsening or severe pain, or other concern.   You were given medication in the ER - no driving for the next 8 hours.

## 2019-08-11 ENCOUNTER — Telehealth: Payer: Self-pay | Admitting: *Deleted

## 2019-08-11 NOTE — Telephone Encounter (Signed)
Patient called stating that she has been throwing up since Saturday night .  Went to ED but still complaining of nausea, vomiting and diarrhea.  Called patient back.  Recommended patient call her primary care physician since we are not treating her for cancer.  They would be more helpful in this area.  Patient in agreement.  Will call today

## 2019-08-17 ENCOUNTER — Encounter: Payer: Self-pay | Admitting: Hematology & Oncology

## 2019-08-17 ENCOUNTER — Telehealth: Payer: Self-pay | Admitting: Hematology & Oncology

## 2019-08-17 ENCOUNTER — Inpatient Hospital Stay: Payer: Medicare HMO | Attending: Hematology & Oncology

## 2019-08-17 ENCOUNTER — Other Ambulatory Visit: Payer: Self-pay

## 2019-08-17 ENCOUNTER — Inpatient Hospital Stay: Payer: Medicare HMO | Admitting: Hematology & Oncology

## 2019-08-17 VITALS — BP 130/83 | HR 86 | Temp 98.3°F | Resp 17 | Wt 188.1 lb

## 2019-08-17 DIAGNOSIS — D8989 Other specified disorders involving the immune mechanism, not elsewhere classified: Secondary | ICD-10-CM

## 2019-08-17 DIAGNOSIS — M25552 Pain in left hip: Secondary | ICD-10-CM | POA: Insufficient documentation

## 2019-08-17 DIAGNOSIS — R1084 Generalized abdominal pain: Secondary | ICD-10-CM

## 2019-08-17 DIAGNOSIS — D72823 Leukemoid reaction: Secondary | ICD-10-CM

## 2019-08-17 LAB — CBC WITH DIFFERENTIAL (CANCER CENTER ONLY)
Abs Immature Granulocytes: 0.04 10*3/uL (ref 0.00–0.07)
Basophils Absolute: 0.1 10*3/uL (ref 0.0–0.1)
Basophils Relative: 1 %
Eosinophils Absolute: 0.2 10*3/uL (ref 0.0–0.5)
Eosinophils Relative: 3 %
HCT: 35.4 % — ABNORMAL LOW (ref 36.0–46.0)
Hemoglobin: 11.3 g/dL — ABNORMAL LOW (ref 12.0–15.0)
Immature Granulocytes: 1 %
Lymphocytes Relative: 34 %
Lymphs Abs: 2.9 10*3/uL (ref 0.7–4.0)
MCH: 28.7 pg (ref 26.0–34.0)
MCHC: 31.9 g/dL (ref 30.0–36.0)
MCV: 89.8 fL (ref 80.0–100.0)
Monocytes Absolute: 0.7 10*3/uL (ref 0.1–1.0)
Monocytes Relative: 8 %
Neutro Abs: 4.6 10*3/uL (ref 1.7–7.7)
Neutrophils Relative %: 53 %
Platelet Count: 226 10*3/uL (ref 150–400)
RBC: 3.94 MIL/uL (ref 3.87–5.11)
RDW: 16.6 % — ABNORMAL HIGH (ref 11.5–15.5)
WBC Count: 8.6 10*3/uL (ref 4.0–10.5)
nRBC: 0 % (ref 0.0–0.2)

## 2019-08-17 LAB — CMP (CANCER CENTER ONLY)
ALT: 17 U/L (ref 0–44)
AST: 15 U/L (ref 15–41)
Albumin: 4 g/dL (ref 3.5–5.0)
Alkaline Phosphatase: 80 U/L (ref 38–126)
Anion gap: 4 — ABNORMAL LOW (ref 5–15)
BUN: 20 mg/dL (ref 8–23)
CO2: 29 mmol/L (ref 22–32)
Calcium: 9.2 mg/dL (ref 8.9–10.3)
Chloride: 107 mmol/L (ref 98–111)
Creatinine: 1.02 mg/dL — ABNORMAL HIGH (ref 0.44–1.00)
GFR, Est AFR Am: >60 mL/min (ref >60)
GFR, Estimated: 59 mL/min — ABNORMAL LOW (ref >60)
Glucose, Bld: 111 mg/dL — ABNORMAL HIGH (ref 70–99)
Potassium: 4.4 mmol/L (ref 3.5–5.1)
Sodium: 140 mmol/L (ref 135–145)
Total Bilirubin: 0.3 mg/dL (ref 0.3–1.2)
Total Protein: 6.2 g/dL — ABNORMAL LOW (ref 6.5–8.1)

## 2019-08-17 LAB — LACTATE DEHYDROGENASE: LDH: 197 U/L — ABNORMAL HIGH (ref 98–192)

## 2019-08-17 NOTE — Progress Notes (Signed)
Hematology and Oncology Follow Up Visit  Shannon Stuart [id number] November 16, 1960 59 y.o. 08/17/2019   Principle Diagnosis:   Weight loss, lack of appetite, left hip pain  Current Therapy:    Observation     Interim History:  Shannon Stuart is back for for follow-up.  Again, we have not found any evidence of malignancy with her.  Unfortunately, it seems I were the ones who are trying to help her out.  I know she has seen other doctors.  They have not been able to make much headway with what is going on.  When we last saw her, she did have plain films of her left hip.  She does have significant degenerative changes.  She bodies [facility].  We will call Dr. Rhona Raider of [facility] and see if we can help her.  She apparently had to be in the emergency room for almost a day.  She had nausea and vomiting for a week.  She was given IV fluids I guess.  She was given some nausea medicines.  She still had vomiting.  She had some diarrhea.  I am not sure why she had all this.  She has seen gastroenterology.  We will see about doing an upper GI with small bowel follow-through and maybe this can help.  I just feel bad for [name].  Again we have not found any malignancy with her.  I just am not sure how else we can help her out.  I will know she needs to go back to see gastroenterology.  Overall, I would have to say that her performance status is ECOG 1.  Medications:  Current Outpatient Medications:  .  ALPRAZolam (XANAX) 1 MG tablet, Take 1 mg by mouth 3 (three) times daily. , Disp: , Rfl:  .  amLODipine (NORVASC) 10 MG tablet, Take 1 tablet (10 mg total) by mouth daily. (Patient not taking: Reported on 08/09/2019), Disp: 90 tablet, Rfl: 3 .  amLODipine (NORVASC) 5 MG tablet, Take 5 mg by mouth daily., Disp: , Rfl:  .  aspirin EC 81 MG tablet, Take 81 mg by mouth daily., Disp: , Rfl:  .  atorvastatin (LIPITOR) 10 MG tablet, Take 10 mg by mouth daily at 6 PM. , Disp: , Rfl:  .   capsaicin (ZOSTRIX) 0.025 % cream, Apply topically 2 (two) times daily. Apply to abdomen (Patient not taking: Reported on 08/09/2019), Disp: 60 g, Rfl: 0 .  cholecalciferol (VITAMIN D3) 25 MCG (1000 UNIT) tablet, Take 3,000 Units by mouth daily., Disp: , Rfl:  .  cyclobenzaprine (FLEXERIL) 10 MG tablet, TAKE 1 TABLET BY MOUTH THREE TIMES A DAY AS NEEDED FOR MUSCLE SPASMS (Patient not taking: Reported on 08/09/2019), Disp: 90 tablet, Rfl: 2 .  diclofenac sodium (VOLTAREN) 1 % GEL, Apply 1 application topically daily as needed (pain)., Disp: , Rfl:  .  ibuprofen (ADVIL,MOTRIN) 800 MG tablet, TAKE 1 TABLET BY MOUTH EVERY 8 HOURS AS NEEDED FOR MODERATE PAIN (Patient not taking: No sig reported), Disp: 90 tablet, Rfl: 0 .  lisinopril (PRINIVIL,ZESTRIL) 40 MG tablet, Take 1 tablet (40 mg total) by mouth daily., Disp: 30 tablet, Rfl: 11 .  Omega-3 Fatty Acids (FISH OIL) 1000 MG CAPS, Take 1,000 mg by mouth daily. , Disp: , Rfl:  .  pantoprazole (PROTONIX) 40 MG tablet, Take 1 tablet (40 mg total) by mouth daily., Disp: 90 tablet, Rfl: 3 .  Potassium 99 MG TABS, Take 1 tablet by mouth 2 (two) times  a week. on Mondays and Fridays, Disp: , Rfl:  .  potassium chloride SA (KLOR-CON) 20 MEQ tablet, Take 1 tablet (20 mEq total) by mouth daily., Disp: 10 tablet, Rfl: 0 .  promethazine (PHENERGAN) 25 MG suppository, Use 1 suppository every 6-8 hours as needed (Patient not taking: Reported on 08/09/2019), Disp: 12 each, Rfl: 0 .  promethazine (PHENERGAN) 25 MG tablet, Take 1 tablet (25 mg total) by mouth every 6 (six) hours as needed for nausea. (Patient not taking: Reported on 08/09/2019), Disp: 10 tablet, Rfl: 0 .  traZODone (DESYREL) 100 MG tablet, Take 100 mg by mouth at bedtime. , Disp: , Rfl:   Allergies:  Allergies  Allergen Reactions  . Morphine And Related Other (See Comments)    Makes "loopy" and chest "feel funny".   Tresa Moore Hcl] Nausea And Vomiting    Past Medical History, Surgical  history, Social history, and Family History were reviewed and updated.  Review of Systems: Review of Systems  Constitutional: Positive for fatigue and unexpected weight change.  HENT:  Negative.   Eyes: Negative.   Respiratory: Negative.   Cardiovascular: Negative.   Gastrointestinal: Positive for nausea.  Endocrine: Negative.   Genitourinary: Negative.    Musculoskeletal: Positive for back pain and gait problem.  Neurological: Positive for gait problem.  Hematological: Negative.   Psychiatric/Behavioral: Negative.     Physical Exam:  vitals were not taken for this visit.   Wt Readings from Last 3 Encounters:  08/09/19 172 lb (78 kg)  06/17/19 191 lb 6.4 oz (86.8 kg)  05/25/19 197 lb (89.4 kg)    Physical Exam Vitals reviewed.  HENT:     Head: Normocephalic and atraumatic.  Eyes:     Pupils: Pupils are equal, round, and reactive to light.  Cardiovascular:     Rate and Rhythm: Normal rate and regular rhythm.     Heart sounds: Normal heart sounds.  Pulmonary:     Effort: Pulmonary effort is normal.     Breath sounds: Normal breath sounds.  Abdominal:     General: Bowel sounds are normal.     Palpations: Abdomen is soft.  Musculoskeletal:        General: No tenderness or deformity. Normal range of motion.     Cervical back: Normal range of motion.     Comments: Her left hip is definitely tender to palpation.  She has decreased range of motion of the left hip.  Lymphadenopathy:     Cervical: No cervical adenopathy.  Skin:    General: Skin is warm and dry.     Findings: No erythema or rash.  Neurological:     Mental Status: She is alert and oriented to person, place, and time.  Psychiatric:        Behavior: Behavior normal.        Thought Content: Thought content normal.        Judgment: Judgment normal.    Lab Results  Component Value Date   WBC 12.4 (H) 08/09/2019   HGB 14.3 08/09/2019   HCT 42.0 08/09/2019   MCV 88.2 08/09/2019   PLT 305 08/09/2019      Chemistry      Component Value Date/Time   NA 138 08/09/2019 2241   NA 139 12/08/2018 1630   NA 141 12/20/2016 1017   K 3.2 (L) 08/09/2019 2241   K 3.4 (L) 12/20/2016 1017   CL 99 08/09/2019 2241   CO2 25 08/09/2019 1526   CO2 27 12/20/2016  1017   BUN 24 (H) 08/09/2019 2241   BUN 21 12/08/2018 1630   BUN 12.0 12/20/2016 1017   CREATININE 1.20 (H) 08/09/2019 2241   CREATININE 1.09 (H) 06/17/2019 0951   CREATININE 1.2 (H) 12/20/2016 1017      Component Value Date/Time   CALCIUM 9.3 08/09/2019 1526   CALCIUM 8.6 12/20/2016 1017   ALKPHOS 75 08/09/2019 1526   ALKPHOS 71 12/20/2016 1017   AST 37 08/09/2019 1526   AST 15 06/17/2019 0951   AST 49 (H) 12/20/2016 1017   ALT 29 08/09/2019 1526   ALT 13 06/17/2019 0951   ALT 42 12/20/2016 1017   BILITOT 1.5 (H) 08/09/2019 1526   BILITOT 0.4 06/17/2019 0951   BILITOT 0.54 12/20/2016 1017       Impression and Plan: Ms. Reback is a very nice 59 year old African-American female.  Thankfully, I just do not see that there is cancer involved with her.  Again, she has her issues.  I just cannot find anything that is cancer.  Thankfully, her weight is actually holding pretty steady.  Her albumin is quite good so what ever she is eating she is absorbing.  We will see about the upper GI and small bowel follow-through.  We will plan to get her back here in about 6 weeks or so.  At that time, maybe we will have a better idea as to what is going on with her left hip and possibly why she has this GI issue.     [name], MD 5/25/20219:47 AM

## 2019-08-17 NOTE — Telephone Encounter (Signed)
Appointments scheduled calendar printed & mailed per 5/25 los °

## 2019-08-18 LAB — PROTEIN ELECTROPHORESIS, SERUM, WITH REFLEX
A/G Ratio: 1.6 (ref 0.7–1.7)
Albumin ELP: 3.5 g/dL (ref 2.9–4.4)
Alpha-1-Globulin: 0.2 g/dL (ref 0.0–0.4)
Alpha-2-Globulin: 0.6 g/dL (ref 0.4–1.0)
Beta Globulin: 0.8 g/dL (ref 0.7–1.3)
Gamma Globulin: 0.7 g/dL (ref 0.4–1.8)
Globulin, Total: 2.2 g/dL (ref 2.2–3.9)
Total Protein ELP: 5.7 g/dL — ABNORMAL LOW (ref 6.0–8.5)

## 2019-08-18 LAB — IGG, IGA, IGM
IgA: 111 mg/dL (ref 87–352)
IgG (Immunoglobin G), Serum: 764 mg/dL (ref 586–1602)
IgM (Immunoglobulin M), Srm: 71 mg/dL (ref 26–217)

## 2019-08-18 LAB — KAPPA/LAMBDA LIGHT CHAINS
Kappa free light chain: 21.4 mg/L — ABNORMAL HIGH (ref 3.3–19.4)
Kappa, lambda light chain ratio: 1.56 (ref 0.26–1.65)
Lambda free light chains: 13.7 mg/L (ref 5.7–26.3)

## 2019-09-07 ENCOUNTER — Other Ambulatory Visit (HOSPITAL_COMMUNITY): Payer: Medicare HMO

## 2019-09-15 ENCOUNTER — Other Ambulatory Visit: Payer: Self-pay

## 2019-09-15 ENCOUNTER — Ambulatory Visit (HOSPITAL_COMMUNITY)
Admission: RE | Admit: 2019-09-15 | Discharge: 2019-09-15 | Disposition: A | Discharge status: Home / Self Care | Payer: Medicare HMO | Source: Ambulatory Visit | Attending: Hematology & Oncology | Admitting: Hematology & Oncology

## 2019-09-15 DIAGNOSIS — R1084 Generalized abdominal pain: Secondary | ICD-10-CM | POA: Insufficient documentation

## 2019-09-29 ENCOUNTER — Telehealth: Payer: Self-pay | Admitting: *Deleted

## 2019-09-29 NOTE — Telephone Encounter (Signed)
As noted below by Dr. Marin Olp, I informed the patient that the upper GI test isn't bad. There is nothing definitive that was found. Instructed her to call the office if she had any questions or concerns.

## 2019-09-29 NOTE — Telephone Encounter (Signed)
-----   Message from [name], MD sent at 09/28/2019  5:51 PM EDT ----- Call - the upper GI test really is not bad.  There is nothing definitive that was found!!  Shannon Stuart

## 2019-10-04 ENCOUNTER — Other Ambulatory Visit: Payer: Medicare HMO

## 2019-10-04 ENCOUNTER — Ambulatory Visit: Payer: Medicare HMO | Admitting: Hematology & Oncology

## 2019-10-18 ENCOUNTER — Inpatient Hospital Stay: Payer: Medicare HMO | Admitting: Hematology & Oncology

## 2019-10-18 ENCOUNTER — Telehealth: Payer: Self-pay | Admitting: *Deleted

## 2019-10-18 ENCOUNTER — Inpatient Hospital Stay: Payer: Medicare HMO

## 2019-10-18 NOTE — Telephone Encounter (Signed)
Call received from patient stating that she is not feeling well today and will need to reschedule her appt with Dr. Marin Olp for today.  Message sent to scheduling.

## 2019-10-19 ENCOUNTER — Other Ambulatory Visit: Payer: Self-pay

## 2019-10-19 ENCOUNTER — Emergency Department (HOSPITAL_COMMUNITY)
Admission: EM | Admit: 2019-10-19 | Discharge: 2019-10-19 | Disposition: A | Discharge status: Home / Self Care | Payer: Medicare HMO | Attending: Emergency Medicine | Admitting: Emergency Medicine

## 2019-10-19 ENCOUNTER — Encounter (HOSPITAL_COMMUNITY): Payer: Self-pay | Admitting: Obstetrics and Gynecology

## 2019-10-19 DIAGNOSIS — R197 Diarrhea, unspecified: Secondary | ICD-10-CM | POA: Insufficient documentation

## 2019-10-19 DIAGNOSIS — Z5321 Procedure and treatment not carried out due to patient leaving prior to being seen by health care provider: Secondary | ICD-10-CM | POA: Diagnosis not present

## 2019-10-19 DIAGNOSIS — R112 Nausea with vomiting, unspecified: Secondary | ICD-10-CM | POA: Insufficient documentation

## 2019-10-19 LAB — COMPREHENSIVE METABOLIC PANEL
ALT: 22 U/L (ref 0–44)
AST: 26 U/L (ref 15–41)
Albumin: 4.7 g/dL (ref 3.5–5.0)
Alkaline Phosphatase: 67 U/L (ref 38–126)
Anion gap: 14 (ref 5–15)
BUN: 20 mg/dL (ref 8–23)
CO2: 19 mmol/L — ABNORMAL LOW (ref 22–32)
Calcium: 9.3 mg/dL (ref 8.9–10.3)
Chloride: 108 mmol/L (ref 98–111)
Creatinine, Ser: 0.92 mg/dL (ref 0.44–1.00)
GFR calc Af Amer: >60 mL/min (ref >60)
GFR calc non Af Amer: >60 mL/min (ref >60)
Glucose, Bld: 134 mg/dL — ABNORMAL HIGH (ref 70–99)
Potassium: 3.8 mmol/L (ref 3.5–5.1)
Sodium: 141 mmol/L (ref 135–145)
Total Bilirubin: 0.8 mg/dL (ref 0.3–1.2)
Total Protein: 7.8 g/dL (ref 6.5–8.1)

## 2019-10-19 LAB — CBC
HCT: 45.2 % (ref 36.0–46.0)
Hemoglobin: 14.5 g/dL (ref 12.0–15.0)
MCH: 28.8 pg (ref 26.0–34.0)
MCHC: 32.1 g/dL (ref 30.0–36.0)
MCV: 89.9 fL (ref 80.0–100.0)
Platelets: 238 10*3/uL (ref 150–400)
RBC: 5.03 MIL/uL (ref 3.87–5.11)
RDW: 16.5 % — ABNORMAL HIGH (ref 11.5–15.5)
WBC: 7.5 10*3/uL (ref 4.0–10.5)
nRBC: 0 % (ref 0.0–0.2)

## 2019-10-19 LAB — LIPASE, BLOOD: Lipase: 24 U/L (ref 11–51)

## 2019-10-19 LAB — CBG MONITORING, ED: Glucose-Capillary: 131 mg/dL — ABNORMAL HIGH (ref 70–99)

## 2019-10-19 MED ORDER — SODIUM CHLORIDE 0.9% FLUSH: 3.0000 mL | Freq: Once | INTRAVENOUS | Status: DC

## 2019-10-19 NOTE — ED Notes (Signed)
Patient had lay down in the floor in the lobby. RN attempted to help patient to get up and she would not assist. Patient kept her eyes closed and started to shake her head to mimic a seizure. RN's vitals are WDL. Press photographer made aware

## 2019-10-19 NOTE — ED Triage Notes (Signed)
Pt reports to the ER via EMS. Patient has N/V/D. Patient has been nauseated since yesterday and is unable to keep anything down. Patient's sister attempted to give her rectal phenergan. Patient is reportedly allergic to zofran. Patient's vitals WNL.

## 2019-11-03 ENCOUNTER — Emergency Department (HOSPITAL_COMMUNITY): Payer: Medicare HMO

## 2019-11-03 ENCOUNTER — Encounter (HOSPITAL_COMMUNITY): Payer: Self-pay | Admitting: *Deleted

## 2019-11-03 ENCOUNTER — Emergency Department (HOSPITAL_COMMUNITY)
Admission: EM | Admit: 2019-11-03 | Discharge: 2019-11-03 | Disposition: A | Discharge status: Home / Self Care | Payer: Medicare HMO | Attending: Emergency Medicine | Admitting: Emergency Medicine

## 2019-11-03 ENCOUNTER — Other Ambulatory Visit: Payer: Self-pay

## 2019-11-03 DIAGNOSIS — R079 Chest pain, unspecified: Secondary | ICD-10-CM | POA: Insufficient documentation

## 2019-11-03 DIAGNOSIS — R42 Dizziness and giddiness: Secondary | ICD-10-CM | POA: Diagnosis not present

## 2019-11-03 DIAGNOSIS — Z5321 Procedure and treatment not carried out due to patient leaving prior to being seen by health care provider: Secondary | ICD-10-CM | POA: Diagnosis not present

## 2019-11-03 LAB — CBC
HCT: 38.1 % (ref 36.0–46.0)
Hemoglobin: 11.9 g/dL — ABNORMAL LOW (ref 12.0–15.0)
MCH: 28.7 pg (ref 26.0–34.0)
MCHC: 31.2 g/dL (ref 30.0–36.0)
MCV: 91.8 fL (ref 80.0–100.0)
Platelets: 234 10*3/uL (ref 150–400)
RBC: 4.15 MIL/uL (ref 3.87–5.11)
RDW: 17.1 % — ABNORMAL HIGH (ref 11.5–15.5)
WBC: 8.8 10*3/uL (ref 4.0–10.5)
nRBC: 0 % (ref 0.0–0.2)

## 2019-11-03 LAB — BASIC METABOLIC PANEL
Anion gap: 11 (ref 5–15)
BUN: 17 mg/dL (ref 8–23)
CO2: 27 mmol/L (ref 22–32)
Calcium: 9.3 mg/dL (ref 8.9–10.3)
Chloride: 100 mmol/L (ref 98–111)
Creatinine, Ser: 0.93 mg/dL (ref 0.44–1.00)
GFR calc Af Amer: >60 mL/min (ref >60)
GFR calc non Af Amer: >60 mL/min (ref >60)
Glucose, Bld: 94 mg/dL (ref 70–99)
Potassium: 4.6 mmol/L (ref 3.5–5.1)
Sodium: 138 mmol/L (ref 135–145)

## 2019-11-03 LAB — TROPONIN I (HIGH SENSITIVITY): Troponin I (High Sensitivity): 6 ng/L (ref <18)

## 2019-11-03 NOTE — ED Notes (Signed)
No answer for VS x1, in back?

## 2019-11-03 NOTE — ED Provider Notes (Addendum)
Patient placed in Quick Look pathway, seen and evaluated   Chief Complaint: CP x 1 week  HPI:   Chest pain on and off x1 week. Getting more frequent, even at rest. Woke her up in the night last night. Patient called PCP he was told to come to the ER. Started centralized and goes into the left arm. Patient states that she has been throwing up with diarrhea for the last several weeks. Pain started thereafter. Patient has been vaccinated for Covid. No chest pain right now, does endorse chest pain in the waiting room. Seen by Dr. Harrington Challenger with cardiology but has not seen her in quite some time   ROS: Chest pain, shortness of breath with the pain.  Physical Exam:   Gen: No distress  Neuro: Awake and Alert  Skin: Warm    Focused Exam: Well-appearing, no acute distress, speaking full sentences, nondiaphoretic.    EKG reviewed by Dr. Jeanell Sparrow, no acute changes   Initiation of care has begun. The patient has been counseled on the process, plan, and necessity for staying for the completion/evaluation, and the remainder of the medical screening examination       Garald Balding, PA-C 11/03/19 1448    Maudie Flakes, MD 11/03/19 1523

## 2019-11-03 NOTE — ED Notes (Signed)
No answer for VS x2, moving OTF

## 2019-11-03 NOTE — ED Triage Notes (Signed)
To ED for eval of cp that woke pt last night. Dizzy for past few months. No neuro deficits noted - speech clear, resp e/u. Skin w/d. EDP and PA in for pt eval in triage.

## 2020-01-27 DIAGNOSIS — M5136 Other intervertebral disc degeneration, lumbar region: Secondary | ICD-10-CM | POA: Diagnosis not present

## 2020-01-27 DIAGNOSIS — M5416 Radiculopathy, lumbar region: Secondary | ICD-10-CM | POA: Diagnosis not present

## 2020-01-27 DIAGNOSIS — M47816 Spondylosis without myelopathy or radiculopathy, lumbar region: Secondary | ICD-10-CM | POA: Diagnosis not present

## 2020-01-27 DIAGNOSIS — F1120 Opioid dependence, uncomplicated: Secondary | ICD-10-CM | POA: Diagnosis not present

## 2020-01-31 ENCOUNTER — Ambulatory Visit: Payer: Medicare HMO | Admitting: Family Medicine

## 2020-02-02 ENCOUNTER — Other Ambulatory Visit: Payer: Self-pay

## 2020-02-02 ENCOUNTER — Encounter: Payer: Self-pay | Admitting: Family Medicine

## 2020-02-02 ENCOUNTER — Telehealth: Payer: Self-pay | Admitting: *Deleted

## 2020-02-02 ENCOUNTER — Ambulatory Visit (INDEPENDENT_AMBULATORY_CARE_PROVIDER_SITE_OTHER): Payer: Medicare HMO | Admitting: Family Medicine

## 2020-02-02 VITALS — BP 124/98 | HR 76 | Ht 68.0 in | Wt 200.6 lb

## 2020-02-02 DIAGNOSIS — F418 Other specified anxiety disorders: Secondary | ICD-10-CM | POA: Diagnosis not present

## 2020-02-02 DIAGNOSIS — Z23 Encounter for immunization: Secondary | ICD-10-CM

## 2020-02-02 DIAGNOSIS — G8929 Other chronic pain: Secondary | ICD-10-CM

## 2020-02-02 DIAGNOSIS — E785 Hyperlipidemia, unspecified: Secondary | ICD-10-CM | POA: Diagnosis not present

## 2020-02-02 DIAGNOSIS — Z13220 Encounter for screening for lipoid disorders: Secondary | ICD-10-CM | POA: Insufficient documentation

## 2020-02-02 DIAGNOSIS — R112 Nausea with vomiting, unspecified: Secondary | ICD-10-CM

## 2020-02-02 DIAGNOSIS — R7303 Prediabetes: Secondary | ICD-10-CM | POA: Diagnosis not present

## 2020-02-02 DIAGNOSIS — M25519 Pain in unspecified shoulder: Secondary | ICD-10-CM | POA: Diagnosis not present

## 2020-02-02 DIAGNOSIS — E782 Mixed hyperlipidemia: Secondary | ICD-10-CM | POA: Diagnosis not present

## 2020-02-02 LAB — POCT GLYCOSYLATED HEMOGLOBIN (HGB A1C): Hemoglobin A1C: 6 % — AB (ref 4.0–5.6)

## 2020-02-02 MED ORDER — ALPRAZOLAM 1 MG PO TABS: 1.0000 mg | ORAL_TABLET | Freq: Three times a day (TID) | ORAL | 5 refills | Status: DC

## 2020-02-02 MED ORDER — KETOROLAC TROMETHAMINE 30 MG/ML IJ SOLN
30.0000 mg | Freq: Once | INTRAMUSCULAR | Status: AC
  Administered 2020-02-02: 30 mg via INTRAMUSCULAR

## 2020-02-02 NOTE — Assessment & Plan Note (Addendum)
Patient's anxiety and depression has been an issue lately, and she ran out of alprazolam. Refilled alprazolam and discussed decreasing dose in the future. Referral sent to [facility] for mental health counseling per patient request.

## 2020-02-02 NOTE — Assessment & Plan Note (Signed)
Patient endorses ongoing shoulder pain and has been getting toradol shots every two months. Administered her next shot today.

## 2020-02-02 NOTE — Patient Instructions (Addendum)
The long term side effect of phenergan is tardive dyskinesia.  Read more about it.  You are better off with less phenergan.   Try getting off weed for two months to see if that makes a difference.  Look up cyclic vomiting syndrome. I ordered your alprazolam.  It is a controled substance and I would like to decrease it over time.  Not today. See me for a pap smear at your convenience - sometime in the next 2-3 months. I will call with your lab test results.

## 2020-02-02 NOTE — Progress Notes (Addendum)
    SUBJECTIVE:   CHIEF COMPLAINT / HPI:   Shannon Stuart (MRN: [id number]) is a 59 y.o. female with a history of intractable vomiting, HTN, HLD, prediabetes, and anxiety who presents with ongoing intractable vomiting.  Intractable vomiting [name] states that since September of last year, she has been having worsening intractable vomiting. She states that she vomits 10-12 times per day. She says she has seen a GI doctor about this before and that all testing revealed nonspecific findings. She has been to the ED multiple times over the last year for vomiting, and no diagnosis has been established. She says the only treatments that have helped her are phenergan and dilaudid. She takes phenergan suppositories at home around 4-5 times per month, and they seem to help. She endorses marijuana use, and she says she has been told in the past about cyclical vomiting syndrome.  Shoulder pain She has been having generalized shoulder pain, and she has gotten toradol shots every other month for this. She would like her next one today.  Anxiety with depression [name] takes alprazolam for anxiety with no issues. She states she is in need of a refill. She also wants to meet with [name] with [facility] for counseling services.  Health maintenance She wants to get her COVID-19 booster and flu vaccine today.  PERTINENT  PMH / PSH: intractable vomiting, HTN, HLD, prediabetes, anxiety   OBJECTIVE:   BP (!) 124/98   Pulse 76   Ht 5\' 8"  (1.727 m)   Wt 200 lb 9.6 oz (91 kg)   SpO2 97%   BMI 30.50 kg/m    PHYSICAL EXAM  GEN: well developed, well-nourished, in NAD HEAD: NCAT, neck supple RESP: Breathing comfortably on RA, no retractions EXT: Moves all extremities equally    ASSESSMENT/PLAN:   Intractable vomiting Patient has endorsed extensive use of marijuana which can cause cyclical vomiting syndrome. Counseled on the risks of this. Discussed stopping marijuana use for 2 months and assess improvement. Also  discussed use of phenergan suppositories and risk of tardive dyskinesia; counseled on using as little phenergan as possible.  Shoulder pain Patient endorses ongoing shoulder pain and has been getting toradol shots every two months. Administered her next shot today.  Anxiety with depression Patient's anxiety and depression has been an issue lately, and she ran out of alprazolam. Refilled alprazolam and discussed decreasing dose in the future. Referral sent to [facility] for mental health counseling per patient request.   Health maintenance Patient has requested to get flu vaccine and COVID-19 booster. Both shots administered. Lipid panel ordered to assess HLD and HgbA1c ordered to assess prediabetes. Will call with results. Also discussed getting next pap smear in 2-3 months.  [place]

## 2020-02-02 NOTE — Assessment & Plan Note (Addendum)
Patient has endorsed extensive use of marijuana which can cause cyclical vomiting syndrome. Counseled on the risks of this. Discussed stopping marijuana use for 2 months and assess improvement. Also discussed use of phenergan suppositories and risk of tardive dyskinesia; counseled on using as little phenergan as possible.

## 2020-02-02 NOTE — Chronic Care Management (AMB) (Signed)
  Chronic Care Management   Note  02/02/2020 Name: Shannon Stuart MRN: [id number] DOB: Sep 12, 1960  Shannon Stuart is a 59 y.o. year old female who is a primary care patient of Hensel, Jamal Collin, MD. I reached out to [name] by phone today in response to a referral sent by Ms. Shannon Flaming Cerveny's PCP, Zenia Resides, MD.     [name] was given information about Chronic Care Management services today including:  1. CCM service includes personalized support from designated clinical staff supervised by her physician, including individualized plan of care and coordination with other care providers 2. 24/7 contact phone numbers for assistance for urgent and routine care needs. 3. Service will only be billed when office clinical staff spend 20 minutes or more in a month to coordinate care. 4. Only one practitioner may furnish and bill the service in a calendar month. 5. The patient may stop CCM services at any time (effective at the end of the month) by phone call to the office staff. 6. The patient will be responsible for cost sharing (co-pay) of up to 20% of the service fee (after annual deductible is met).  Patient agreed to services and verbal consent obtained.   Follow up plan: Telephone appointment with care management team member scheduled for:02/04/2020  [facility]  Direct Dial: [phone]

## 2020-02-03 ENCOUNTER — Encounter: Payer: Self-pay | Admitting: Family Medicine

## 2020-02-03 LAB — LIPID PANEL
Chol/HDL Ratio: 4.6 ratio — ABNORMAL HIGH (ref 0.0–4.4)
Cholesterol, Total: 251 mg/dL — ABNORMAL HIGH (ref 100–199)
HDL: 54 mg/dL (ref >39)
LDL Chol Calc (NIH): 170 mg/dL — ABNORMAL HIGH (ref 0–99)
Triglycerides: 151 mg/dL — ABNORMAL HIGH (ref 0–149)
VLDL Cholesterol Cal: 27 mg/dL (ref 5–40)

## 2020-02-03 MED ORDER — ROSUVASTATIN CALCIUM 20 MG PO TABS: 20.0000 mg | ORAL_TABLET | Freq: Every day | ORAL | 3 refills | Status: DC

## 2020-02-03 NOTE — Progress Notes (Signed)
Brief synopsis:  Patient reestablishing care in [facility]:  Issues: 1. DM: Diet controled  At one point she weighed ~230.  Weight got as low as 172.  Now creaping back up.  A1C today=6.0 2. HBP.  States taking meds.  Diastolic up a bit today.  No CP or SOB. 3. High cholesterol: primary prevention in diabetic.   4. Depression/anxiety.  Current med is alprazolam.  I refilled and my long term goal is to decrease this controled substance. 5. Recurrent episodes of vomiting.  Seeing GI.  Has esophageal dysmotility on UGI with SBFT.  Takes prn phenergan suppositories ~6 doses per month.  Had not been warned about tardive dyskinesia.  She had been told about possible link between cyclic vomiting and marijuana - a point which I tried to emphasize. 6. HPDP due for several things.  Does not want pap today.  Did agree to immunizations.

## 2020-02-03 NOTE — Assessment & Plan Note (Signed)
Patient called with very high LDL results.  Has not been taking statin.  Reluctant initially wanting to control with diet and exercise.  I convinced to take.  Needs high potency statin.  Switched to crestor 20  And will plan to recheck in three months.

## 2020-02-04 ENCOUNTER — Ambulatory Visit: Payer: Medicare HMO | Admitting: Licensed Clinical Social Worker

## 2020-02-04 DIAGNOSIS — F439 Reaction to severe stress, unspecified: Secondary | ICD-10-CM

## 2020-02-04 DIAGNOSIS — Z719 Counseling, unspecified: Secondary | ICD-10-CM

## 2020-02-04 NOTE — Chronic Care Management (AMB) (Signed)
Care Management   Clinical Social Work initial Note  02/04/2020 Name: Shannon Stuart MRN: [id number] DOB: Jul 18, 1960 Shannon Stuart is a 59 y.o. year old female who sees Hensel, Jamal Collin, MD for primary care. The Care Management team was consulted by PCP to assist the patient with managing symptoms of anxiety and depression.  LCSW reached out to Shannon Stuart today by phone to introduce self, assess needs and barriers to care.    Assessment: Patient is experiencing difficulty with symptoms of feeling overwhelmed and managing stressors which seems to be exacerbated by concerns with her over all health, previous medical provider and running out of xanax  & Trazodone. States starting to feel betters since restarting medication . Currently living with sister and looking for a larger place.  Recommendation: Patient may benefit from, and is in agreement to brief interventions sessions with LCSW to get her back on track.  Plan: LCSW will f/u with patient in 2 weeks   Advance Directive Status: ; not addressed during this encounter.  SDOH (Social Determinants of Health) assessments performed: Yes ;  SDOH Interventions     Most Recent Value  SDOH Interventions  SDOH Interventions for the Following Domains Depression, Stress  Housing Interventions Other (Comment)  [gave information for housing coalition]  Stress Interventions Provide Counseling  Depression Interventions/Treatment  Counseling, Currently on Treatment       Patient Care Plan: General Social Work  Problem Identified: Coping Skills/ managing symptoms of anxiety & Depression   Priority: High  Goal: Coping Skills Enhanced   Start Date: 02/04/2020  Expected End Date: 04/21/2020  Priority: High  Note:   Objective: PHQ-9 score of 9 is an indication of mild symptoms of depression. Depression screen [facility] 2/9 02/02/2020 02/24/2019 02/23/2019  Decreased Interest 1 1 1   Down, Depressed, Hopeless 0 1 1  PHQ - 2 Score 1 2 2   Altered sleeping 2 1  -  Tired, decreased energy 2 2 -  Change in appetite 2 1 -  Feeling bad or failure about yourself  1 1 -  Trouble concentrating 1 1 -  Moving slowly or fidgety/restless 0 0 -  Suicidal thoughts 0 0 -  PHQ-9 Score 9 8 -  Difficult doing work/chores - - -  Some recent data might be hidden   Current Barriers:   . Needed refill on medication  . Patient needs Support, Education, and Care Coordination in order to meet unmet mental health needs   Clinical Goal(s): Over the next 90 days, patient will work with SW to reduce or manage symptoms of anxiety, depression, and stress. Interventions:  . Assessed patient's previous treatment, needs, coping skills, current treatment, support system and barriers to care . Reviewed previous PHQ-9 . Provided basic mental health support, education and interventions EMMI Education " Relieving Stress"  . Reviewed mental health medications with patient prescribed by PCP and discussed compliance   active listening utilized  decision-making supported  healthy lifestyle promoted  mindfulness encouraged  problem-solving facilitated  self-reflection promoted  spiritual activities promoted  verbalization of feelings encouraged . Other interventions include: Motivational Interviewing Behavioral Activation ;  Patient Goals/Self-Care Activities: Over the next 14 days . To day you set two goals: increase activity and be positive  . implement interventions discussed today to decreases symptoms of anxiety, depression, and stress and increase knowledge and/or ability of: coping skills, healthy habits, self-management skills, and stress reduction. . Review EMMI educational information (Relieving Stress) Follow Up Plan:  . SW  will follow up with patient by phone over the next 2 weeks     Outpatient Encounter Medications as of 02/04/2020  Medication Sig  . ALPRAZolam (XANAX) 1 MG tablet Take 1 tablet (1 mg total) by mouth 3 (three) times daily.  Marland Kitchen aspirin EC  81 MG tablet Take 81 mg by mouth daily.  . cholecalciferol (VITAMIN D3) 25 MCG (1000 UNIT) tablet Take 3,000 Units by mouth daily.  . diclofenac sodium (VOLTAREN) 1 % GEL Apply 1 application topically daily as needed (pain).  Marland Kitchen lisinopril (PRINIVIL,ZESTRIL) 40 MG tablet Take 1 tablet (40 mg total) by mouth daily.  . Omega-3 Fatty Acids (FISH OIL) 1000 MG CAPS Take 1,000 mg by mouth daily.   . pantoprazole (PROTONIX) 40 MG tablet Take 1 tablet (40 mg total) by mouth daily.  . potassium chloride SA (KLOR-CON) 20 MEQ tablet Take 1 tablet (20 mEq total) by mouth daily.  . promethazine (PHENERGAN) 25 MG suppository Use 1 suppository every 6-8 hours as needed (Patient not taking: Reported on 08/09/2019)  . rosuvastatin (CRESTOR) 20 MG tablet Take 1 tablet (20 mg total) by mouth daily.  . traZODone (DESYREL) 100 MG tablet Take 100 mg by mouth at bedtime.    No facility-administered encounter medications on file as of 02/04/2020.   Review of patient status, including review of consultants reports, relevant laboratory and other test results, and collaboration with appropriate care team members and the patient's provider was performed as part of comprehensive patient evaluation and provision of chronic care management services.       Information about Care Management services was shared with Shannon Stuart today including:  1. Care Management services include personalized support from designated clinical staff supervised by her physician, including individualized plan of care and coordination with other care providers 2. Remind patient of 24/7 contact phone numbers to provider's office for assistance with urgent and routine care needs. 3. Care Management services are voluntary and patient may stop at any time .  Patient agreed to services provided today and verbal consent obtained.     [name], [place] / [place]    [phone] 12:04 PM

## 2020-02-04 NOTE — Patient Instructions (Signed)
  Ms. Staub  it was nice speaking with you. Please call me directly [phone] if you have questions about the goals we discussed. Goals Addressed            This Visit's Progress   . Manage My Emotions        Patient Goals/Self-Care Activities: Over the next 14 days . To day you set two goals: increase activity and be positive  . implement interventions discussed today to decreases symptoms of anxiety, depression, and stress and increase knowledge and/or ability of: coping skills, healthy habits, self-management skills, and stress reduction. . Review EMMI educational information (Relieving Stress) . Practice positive thinking and self-talk    Why is this important?   When you are stressed, down or upset, your body reacts too.  For example, your blood pressure may get higher; you may have a headache or stomachache.  When your emotions get the best of you, your body's ability to fight off cold and flu gets weak.  These steps will help you manage your emotions.        Ms. Mceachron received Care Management services today:  1. Care Management services include personalized support from designated clinical staff supervised by her physician, including individualized plan of care and coordination with other care providers 2. 24/7 contact [phone] for assistance for urgent and routine care needs. 3. Care Management are voluntary services and be declined at any time by calling the office.  The patient verbalized understanding of instructions, educational materials, and care plan provided today .  Follow up plan: SW will follow up with patient by phone over the next 2 weeks  Maurine Cane, LCSW

## 2020-02-16 ENCOUNTER — Ambulatory Visit: Payer: Medicare HMO | Admitting: Licensed Clinical Social Worker

## 2020-02-16 DIAGNOSIS — Z139 Encounter for screening, unspecified: Secondary | ICD-10-CM

## 2020-02-16 DIAGNOSIS — Z719 Counseling, unspecified: Secondary | ICD-10-CM

## 2020-02-16 NOTE — Patient Instructions (Signed)
  Ms. Benner  it was nice speaking with you. Please call me directly [phone] if you have questions about the goals we discussed. Goals Addressed            This Visit's Progress   . locate housing       Patient Goals/Self-Care Activities:  . I have placed a referral to [employer]  . Call them if no one has call in 2 weeks    . Manage My Emotions   On track     Patient Goals/Self-Care Activities:  . Today you set two goals: increase activity and be positive  . implement interventions discussed today to decreases symptoms of anxiety, depression, and stress and increase knowledge and/or ability of: coping skills, healthy habits, self-management skills, and stress reduction. . Review EMMI educational information (Relieving Stress and breathing to relax) . Relaxed breathing 3 times daily       Ms. Smisek received Care Management services today:  1. Care Management services include personalized support from designated clinical staff supervised by her physician, including individualized plan of care and coordination with other care providers 2. 24/7 contact [phone] for assistance for urgent and routine care needs. 3. Care Management are voluntary services and be declined at any time by calling the office. Patient verbalizes understanding of instructions provided today.    Follow up plan: SW will follow up with patient by phone over the next 2 weeks  Maurine Cane, LCSW

## 2020-02-16 NOTE — Chronic Care Management (AMB) (Signed)
Care Management   Clinical Social Work Follow Up   02/16/2020 Name: Shannon Stuart MRN: [id number] DOB: 12-Feb-1961 Referred by: Zenia Resides, MD  Reason for referral : Care Coordination (f/u call)  Shannon Stuart is a 59 y.o. year old female who is a primary care patient of Hensel, Jamal Collin, MD.  Reason for follow-up: assess for barriers and progress with care plan .    Assessment: Patient continues to experience difficulty with managing emotions but is making progress.    Concerns during this encounter: finding her own place to live Recommendation: Patient may benefit from, and is in agreement to a referral to [employer] .  Plan: LCSW will f/U with patient in 2 weeks   Advance Directive Status: ; not addressed during this encounter.  SDOH (Social Determinants of Health) assessments performed: Yes ; SDOH Interventions     Most Recent Value  SDOH Interventions  SDOH Interventions for the Following Domains Depression  Housing Interventions [id number] Referral  Depression Interventions/Treatment  Counseling      Patient Care Plan: General Social Work  Problem Identified: Coping Skills/ managing symptoms of anxiety & Depression   Priority: High  Goal: Coping Skills Enhanced   Start Date: 02/04/2020  Expected End Date: 04/21/2020  This Visit's Progress: On track  Priority: High  Note:   Current Barriers:  Patient needs Support, Education, and Care Coordination in order to meet unmet mental health needs   Clinical Goal(s): Over the next 90 days, patient will work with SW to reduce or manage symptoms of anxiety, depression, and stress. Interventions:  . Assessed patient's needs, coping skills, progress with management and barriers to care . Provided basic mental health support, education and interventions EMMI Education (Relieving Stress and Breathing to relax)  . Reviewed mental health medications with patient prescribed by PCP and discussed compliance  .   active listening utilized . decision-making supported . mindfulness encouraged and reviewed . problem-solving facilitated . self-reflection promoted . spiritual activities promoted . verbalization of feelings encouraged . Other interventions include: Motivational Interviewing Behavioral Activation ; Patient Goals/Self-Care Activities: Over the next 14 days work on the following . Today you set two goals: increase activity and be positive  . implement interventions discussed today to decreases symptoms of anxiety, depression, and stress and increase knowledge and/or ability of: coping skills, healthy habits, self-management skills, and stress reduction. . Review EMMI educational information (Relieving Stress and breathing to relax) . Relaxed breathing 3 times daily Follow Up Plan:  . SW will follow up with patient by phone over the next 2 weeks   Problem Identified: Emotional Distress     Long-Range Goal: Emotional Health with locating housing   Start Date: 02/16/2020  Current Barriers:  Patient needs assistance with locating housing acknowledges deficits with meeting this unmet need  Clinical Goals: Over the next 90 days, patient will explore options for permanent housing   Interventions:  . Assessment of needs, barriers , agencies contacted, as well as how impacting  . Review various resources and discussed options   . Provided patient with information about [employer]  . Referral placed via [id number] . Other interventions provided:Solution-Focused Strategies;Problem Solving ;  Patient Goals/Self-Care Activities:  . I have placed a referral to [employer]  . Call them if no one has call in 2 weeks Follow Up Plan:  . SW will follow up with patient by phone over the next 2 weeks       Outpatient  Encounter Medications as of 02/16/2020  Medication Sig  . ALPRAZolam (XANAX) 1 MG tablet Take 1 tablet (1 mg total) by mouth 3 (three) times daily.  Marland Kitchen  aspirin EC 81 MG tablet Take 81 mg by mouth daily.  . cholecalciferol (VITAMIN D3) 25 MCG (1000 UNIT) tablet Take 3,000 Units by mouth daily.  . diclofenac sodium (VOLTAREN) 1 % GEL Apply 1 application topically daily as needed (pain).  Marland Kitchen lisinopril (PRINIVIL,ZESTRIL) 40 MG tablet Take 1 tablet (40 mg total) by mouth daily.  . Omega-3 Fatty Acids (FISH OIL) 1000 MG CAPS Take 1,000 mg by mouth daily.   . pantoprazole (PROTONIX) 40 MG tablet Take 1 tablet (40 mg total) by mouth daily.  . potassium chloride SA (KLOR-CON) 20 MEQ tablet Take 1 tablet (20 mEq total) by mouth daily.  . promethazine (PHENERGAN) 25 MG suppository Use 1 suppository every 6-8 hours as needed (Patient not taking: Reported on 08/09/2019)  . rosuvastatin (CRESTOR) 20 MG tablet Take 1 tablet (20 mg total) by mouth daily.  . traZODone (DESYREL) 100 MG tablet Take 100 mg by mouth at bedtime.    No facility-administered encounter medications on file as of 02/16/2020.   Review of patient status, including review of consultants reports, relevant laboratory and other test results, and collaboration with appropriate care team members and the patient's provider was performed as part of comprehensive patient evaluation and provision of care management services.   [name], [place] / [place]   [phone] 11:23 AM

## 2020-03-01 ENCOUNTER — Telehealth: Payer: Medicare HMO

## 2020-03-01 ENCOUNTER — Telehealth: Payer: Self-pay | Admitting: Licensed Clinical Social Worker

## 2020-03-01 NOTE — Chronic Care Management (AMB) (Signed)
    Clinical Social Work  Care Management Outreach   03/01/2020 Name: Shannon Stuart MRN: [id number] DOB: 1960-03-28  Shannon Stuart is a 59 y.o. year old female who is a primary care patient of Hensel, Jamal Collin, MD .   F/U phone appointment with  Shannon Stuart today for ongoing assessment of needs and progress with care plan goals.  The outreach was unsuccessful.  A HIPPA compliant phone message was left for the patient providing contact information and requesting a return call to reschedule phone appointment.   Plan: LCSW will wait for return call.    Review of patient status, including review of consultants reports, relevant laboratory and other test results, and collaboration with appropriate care team members and the patient's provider was performed as part of comprehensive patient evaluation and provision of care management services.    [name], [place] / [place]   [phone] 10:45 AM

## 2020-03-23 DIAGNOSIS — M5416 Radiculopathy, lumbar region: Secondary | ICD-10-CM | POA: Diagnosis not present

## 2020-03-30 ENCOUNTER — Ambulatory Visit: Payer: Medicare HMO | Admitting: Licensed Clinical Social Worker

## 2020-03-30 DIAGNOSIS — F439 Reaction to severe stress, unspecified: Secondary | ICD-10-CM

## 2020-03-30 DIAGNOSIS — Z719 Counseling, unspecified: Secondary | ICD-10-CM

## 2020-03-30 NOTE — Chronic Care Management (AMB) (Signed)
Care Management   Clinical Social Work Follow UpNote  03/30/2020 Name: Shannon Stuart MRN: [id number] DOB: 1960-10-30 Shannon Stuart is a 60 y.o. year old female who sees Hensel, Santiago Bumpers, MD for primary care.  Patient is enrolled in a Managed Medicaid plan: No.  Engaged with patient by telephone today in response to provider referral for social work case management and/or care coordination services. See care plan below for details during this encounter.  Follow up Plan:04/12/2020  Advanced Directives Status:Not addressed in this encounter.     SDOH (Social Determinants of Health) assessments performed: Yes no new needs identified    Patient Care Plan: General Social Work  Problem Identified: Coping Skills/ managing symptoms of anxiety & Depression   Priority: High  Goal: Coping Skills Enhanced /Over the next 90 days, patient will work with SW to reduce or manage symptoms of anxiety, depression, and stress.   Start Date: 02/04/2020  Expected End Date: 07/20/2020  Recent Progress: On track  Priority: High  Note:   Current Barriers:  Patient continues to have stressors related to social situation but is doing better with managing her stress and emotions.   . Often feels like she takes a step backward when trying to move forward . Continues to utilize faith support from pastor, prayer and Positive affirmations.  . Patient needs Support, Education, and Care Coordination in order to meet unmet mental health needs  Interventions:  . Assessed patient's needs, coping skills, progress with management and barriers to care . Provided basic mental health support, education and interventions EMMI Education (Relieving Stress and Breathing to relax)  . active listening utilized and solution focused interventions   . decision-making supported . mindfulness encouraged and reviewed . problem-solving facilitated . self-reflection promoted . spiritual activities promoted . verbalization of feelings  encouraged . Other interventions include: Motivational Interviewing Behavioral Activation ; Patient Goals/Self-Care Activities: Over the next 14 days work on the following . Continue with your goals: increase activity, being positive, faith support and prayer . implement interventions discussed today to decreases symptoms of anxiety, depression, and stress and increase knowledge and/or ability of: coping skills, healthy habits, self-management skills, and stress reduction. . Review EMMI educational information (Relieving Stress and breathing to relax) . Relaxed breathing 3 times daily as well as daily affirmations  Follow Up Plan:  . SW will follow up with patient by phone over the next 2 weeks   Problem Identified: Barriers to Care   Long-Range Goal: Over the next 90 days, patient will explore options for permanent housing   Start Date: 02/16/2020  Expected End Date: 07/21/2020  This Visit's Progress: On track  Priority: High  Current Barriers:  Patient has spoken to [facility] but has not been able to move forward with completing things she needs to do . needs assistance with locating housing acknowledges deficits with meeting this unmet need Interventions:  . Assessment of needs, and discussed barriers  as well as how impacting  . Review various resources and discussed options   . Reviewed update from Referral placed via [email or web address] . Other interventions provided:Solution-Focused Strategies, Emotional/Supportive Counseling, and Problem Solving;Problem Solving ;  Patient Goals/Self-Care Activities:  . Follow up with [employer] to discuss housing options  . Complete application with [facility]  Follow Up Plan:  . SW will follow up with patient by phone over the next 3 weeks      Outpatient Encounter Medications as of 03/30/2020  Medication Sig  . ALPRAZolam (  XANAX) 1 MG tablet Take 1 tablet (1 mg total) by mouth 3 (three) times daily.   Marland Kitchen aspirin EC 81 MG tablet Take 81 mg by mouth daily.  . cholecalciferol (VITAMIN D3) 25 MCG (1000 UNIT) tablet Take 3,000 Units by mouth daily.  . diclofenac sodium (VOLTAREN) 1 % GEL Apply 1 application topically daily as needed (pain).  Marland Kitchen lisinopril (PRINIVIL,ZESTRIL) 40 MG tablet Take 1 tablet (40 mg total) by mouth daily.  . Omega-3 Fatty Acids (FISH OIL) 1000 MG CAPS Take 1,000 mg by mouth daily.   . pantoprazole (PROTONIX) 40 MG tablet Take 1 tablet (40 mg total) by mouth daily.  . potassium chloride SA (KLOR-CON) 20 MEQ tablet Take 1 tablet (20 mEq total) by mouth daily.  . promethazine (PHENERGAN) 25 MG suppository Use 1 suppository every 6-8 hours as needed (Patient not taking: Reported on 08/09/2019)  . rosuvastatin (CRESTOR) 20 MG tablet Take 1 tablet (20 mg total) by mouth daily.  . traZODone (DESYREL) 100 MG tablet Take 100 mg by mouth at bedtime.    No facility-administered encounter medications on file as of 03/30/2020.   Review of patient status, including review of consultants reports, relevant laboratory and other test results, and collaboration with appropriate care team members and the patient's provider was performed as part of comprehensive patient evaluation and provision of chronic care management services.       Information about Care Management services was shared with Ms.  Reali today including:  1. Care Management services include personalized support from designated clinical staff supervised by her physician, including individualized plan of care and coordination with other care providers 2. Remind patient of 24/7 contact phone numbers to provider's office for assistance with urgent and routine care needs. 3. Care Management services are voluntary and patient may stop at any time .   Patient agreed to services provided today and verbal consent obtained.     Sammuel Hines, LCSW Care Management & Coordination  [facility] / [facility]    [phone] 2:08 PM

## 2020-03-30 NOTE — Patient Instructions (Signed)
  Shannon Stuart  it was nice speaking with you. Please call me directly [phone] if you have questions about the goals we discussed. Goals Addressed            This Visit's Progress   . locate housing   Not on track    Patient Goals/Self-Care Activities:  . Follow up with [employer] to discuss housing options  . Complete application with [facility]     . Manage My Emotions   On track     Patient Goals/Self-Care Activities: Over the next 14 days work on the following . Continue with your goals: increase activity, being positive, faith support and prayer . implement interventions discussed today to decreases symptoms of anxiety, depression, and stress and increase knowledge and/or ability of: coping skills, healthy habits, self-management skills, and stress reduction. . Review EMMI educational information (Relieving Stress and breathing to relax) . Relaxed breathing 3 times daily as well as daily affirmations       Shannon Stuart received Care Management services today:  1. Care Management services include personalized support from designated clinical staff supervised by her physician, including individualized plan of care and coordination with other care providers 2. 24/7 contact [phone] for assistance for urgent and routine care needs. 3. Care Management are voluntary services and be declined at any time by calling the office.  Patient verbalizes understanding of instructions provided today.    Follow up plan: SW will follow up with patient by phone over the next 2 weeks  Soundra Pilon, LCSW

## 2020-04-06 DIAGNOSIS — M5416 Radiculopathy, lumbar region: Secondary | ICD-10-CM | POA: Diagnosis not present

## 2020-04-06 DIAGNOSIS — M5136 Other intervertebral disc degeneration, lumbar region: Secondary | ICD-10-CM | POA: Diagnosis not present

## 2020-04-06 DIAGNOSIS — F1120 Opioid dependence, uncomplicated: Secondary | ICD-10-CM | POA: Diagnosis not present

## 2020-04-12 ENCOUNTER — Ambulatory Visit: Payer: Medicare HMO | Admitting: Licensed Clinical Social Worker

## 2020-04-12 DIAGNOSIS — Z719 Counseling, unspecified: Secondary | ICD-10-CM

## 2020-04-12 DIAGNOSIS — Z7189 Other specified counseling: Secondary | ICD-10-CM

## 2020-04-12 DIAGNOSIS — F439 Reaction to severe stress, unspecified: Secondary | ICD-10-CM

## 2020-04-12 NOTE — Patient Instructions (Signed)
  Shannon Stuart  it was nice speaking with you. Please call me directly [phone] if you have questions about the goals we discussed. Goals Addressed            This Visit's Progress   . Effective Long-Term Care Planning       Timeframe:  Short-Term Goal Priority:  Medium Start Date:   04/12/2020                          Expected End Date:  06/22/2020    Patient Goals/Self-Care Activities:  . Review e-mailed EMMI education on Advance Directive  . Complete Advance Directive packet,  . Call LCSW if you have questions  . Have advance directive notarized and provide a copy to provider office . Discuss with sister                . locate housing       Timeframe:  Long-Range Goal Priority:  High Start Date:     02/04/20                        Expected End Date:   on going                     Patient Goals/Self-Care Activities:  . Follow up with [place] to discuss housing options  . Call [facility] if no one has called you back in 2 weeks    . Manage My Emotions        Timeframe:  Long-Range Goal Priority:  High Start Date:   02/04/2020                         Expected End Date:  on going                       Patient Goals/Self-Care Activities:  . Continue with your goals: increase activity, being positive, faith support and prayer . implement interventions discussed today to decreases symptoms of anxiety, depression, and stress and increase knowledge and/or ability of: coping skills, healthy habits, self-management skills, and stress reduction. . Review EMMI educational information (Relieving Stress and breathing to relax) . Relaxed breathing 3 times daily; daily affirmations and Journal your thoughts       Shannon Stuart received Care Management services today:  1. Care Management services include personalized support from designated clinical staff supervised by her physician, including individualized plan of care and  coordination with other care providers 2. 24/7 contact [phone] for assistance for urgent and routine care needs. 3. Care Management are voluntary services and be declined at any time by calling the office.  Patient verbalizes understanding of instructions provided today.    Follow up plan: Appointment scheduled for SW follow up with client by phone on:  Feb. 16, 2022  Maurine Cane, LCSW

## 2020-04-12 NOTE — Chronic Care Management (AMB) (Signed)
Care Management Clinical Social Work Note  04/12/2020 Name: Shannon Stuart MRN: [id number] DOB: 04/01/1960  Shannon Stuart is a 60 y.o. year old female who is a primary care patient of Hensel, Jamal Collin, MD.  The Care Management team was consulted for assistance with chronic disease management and coordination needs.  Engaged with patient by telephone for follow up visit in response to provider referral for social work chronic care management and care coordination services  Consent to Services:  Shannon Stuart was given information about Care Management services today including:  1. Care Management services includes personalized support from designated clinical staff supervised by her physician, including individualized plan of care and coordination with other care providers 2. 24/7 contact phone numbers for assistance for urgent and routine care needs. 3. The patient may stop case management services at any time by phone call to the office staff.  Patient agreed to services and consent obtained.   Assessment: Patient continues to experience difficulty with connecting to resources for ongoing housing needs.  Patient is making progress with managing her stressors this  Seems to be exacerbated when  encountering things that are out of her control . See Care Plan below for interventions and patient self-care actives.  Follow up Plan: Patient would like continued follow-up.  CCM LCSW will f/u with patient by phone in 3 weeks. Patient will call office if needed prior to next encounter   Review of patient past medical history, allergies, medications, and health status, including review of relevant consultants reports was performed today as part of a comprehensive evaluation and provision of chronic care management and care coordination services.  SDOH (Social Determinants of Health) assessments and interventions performed:    Advanced Directives Status: See Care Plan for related entries.  Care  Plan  Allergies  Allergen Reactions  . Morphine And Related Other (See Comments)    Makes "loopy" and chest "feel funny".   Tresa  Hcl] Nausea And Vomiting    Outpatient Encounter Medications as of 04/12/2020  Medication Sig  . ALPRAZolam (XANAX) 1 MG tablet Take 1 tablet (1 mg total) by mouth 3 (three) times daily.  Marland Kitchen aspirin EC 81 MG tablet Take 81 mg by mouth daily.  . cholecalciferol (VITAMIN D3) 25 MCG (1000 UNIT) tablet Take 3,000 Units by mouth daily.  . diclofenac sodium (VOLTAREN) 1 % GEL Apply 1 application topically daily as needed (pain).  Marland Kitchen lisinopril (PRINIVIL,ZESTRIL) 40 MG tablet Take 1 tablet (40 mg total) by mouth daily.  . Omega-3 Fatty Acids (FISH OIL) 1000 MG CAPS Take 1,000 mg by mouth daily.   . pantoprazole (PROTONIX) 40 MG tablet Take 1 tablet (40 mg total) by mouth daily.  . potassium chloride SA (KLOR-CON) 20 MEQ tablet Take 1 tablet (20 mEq total) by mouth daily.  . promethazine (PHENERGAN) 25 MG suppository Use 1 suppository every 6-8 hours as needed (Patient not taking: Reported on 08/09/2019)  . rosuvastatin (CRESTOR) 20 MG tablet Take 1 tablet (20 mg total) by mouth daily.  . traZODone (DESYREL) 100 MG tablet Take 100 mg by mouth at bedtime.    No facility-administered encounter medications on file as of 04/12/2020.    Patient Active Problem List   Diagnosis Date Noted  . Screening cholesterol level 02/02/2020  . Shoulder pain   . Emesis 11/13/2018  . Generalized abdominal pain   . Intractable vomiting   . Diarrhea with dehydration 11/11/2018  . Leukocytosis   . Elevated serum creatinine 03/06/2017  .  Skin lesion of right arm 01/02/2017  . Fingernail abnormalities 01/02/2017  . Rash 12/31/2016  . Changes in vision 09/20/2016  . Amplified musculoskeletal pain, diffuse 09/20/2016  . Trigger point of extremity 09/20/2016  . Chronic diarrhea 09/20/2016  . Chronic bilateral lower abdominal pain 09/20/2016  . 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 Hypokalemia due to excessive gastrointestinal loss of potassium 08/30/2016  . Bone lesion 08/30/2016  . Non-obstructive CAD   . Obesity, Class II, BMI 35-39.9   . Chronic low back pain 10/12/2013  . Well woman exam 09/02/2013  . Prediabetes 06/02/2013  . Trigger point of shoulder region 02/20/2013  . Insomnia 08/18/2012  . Arthritis of both knees 04/11/2011  . Lower extremity edema 09/24/2010  . Depression 08/10/2010  . HLD (hyperlipidemia) 08/04/2009  . Leiomyoma of uterus 04/29/2008  . Dysfunctional uterine bleeding 04/26/2008  . Anxiety with depression 09/15/2006  . RHINITIS, ALLERGIC NOS 09/08/2006  . GERD 09/08/2006  . HYPERTENSION, BENIGN ESSENTIAL 08/04/2006    Conditions to be addressed/monitored: Depression, managing stress; Housing barriers  Care Plan : General Social Work  Updates made by Maurine Cane, LCSW since 04/12/2020 12:00 AM  Problem: Coping Skills/ managing symptoms of anxiety & Depression   Priority: High  Goal: Coping Skills Enhanced /Over the next 90 days, patient will work with SW to reduce or manage symptoms of anxiety, depression, and stress.   Start Date: 02/04/2020  Expected End Date: 07/20/2020  Recent Progress: On track  Priority: High  Current Barriers:   . Often feels like she takes a step backward when trying to move forward . Continues to utilize faith support from pastor, prayer and Positive affirmations.  . Patient needs Support, Education, and Care Coordination in order to meet unmet mental health needs  Interventions:  . Assessed patient's needs, coping skills, progress with management and barriers to care . Reviewed mental health support, education and interventions EMMI Education (Relieving Stress and Breathing to relax)  . Active listening utilized and solution focused interventions   . Decision-making supported with self reflection, and problem solving  . Continues with spiritual activities  . Verbalization of  feelings encouraged with incorporating journaling  . Other interventions include: Motivational Interviewing Behavioral Activation ;task centered approach Patient Goals/Self-Care Activities: Over the next 14 days work on the following . Continue with your goals: increase activity, being positive, faith support and prayer . implement interventions discussed today to decreases symptoms of anxiety, depression, and stress and increase knowledge and/or ability of: coping skills, healthy habits, self-management skills, and stress reduction. . Review EMMI educational information (Relieving Stress and breathing to relax) . Relaxed breathing 3 times daily, daily affirmations and Journal your thoughts  Follow Up Plan:  . SW will follow up with patient by phone over the next 3 weeks   Problem: Barriers to Care   Long-Range Goal: Over the next 90 days, patient will explore options for permanent housing   Start Date: 02/16/2020  Expected End Date: 07/21/2020  Recent Progress: On track  Priority: High  Current Barriers:   . Waiting on return call from [employer] and [employer]   . Patient has spoken to [employer] but has not been able to move forward with completing things she needs to do . needs assistance with locating housing acknowledges deficits with meeting this unmet need Interventions:  . Assessment of needs, and discussed barriers  as well as how impacting  . Review various resources and discussed options  which included advocating for self and follow through  . Reviewed update from Referral placed via [email or web address] . Other interventions provided:Solution-Focused Strategies, Emotional/Supportive Counseling, and Problem Solving;Problem Solving ;  Patient Goals/Self-Care Activities:  . Follow up with [place] to discuss housing options  . Call [facility] if no one has called you back in 2  weeks Follow Up Plan:  . SW will follow up with patient by phone over the next 3 weeks   Problem: Long-Term Care Planning     Goal: Effective Long-Term Care Planning /Over the next 30 days, the patient will review mailed EMMI education on Advance Directive, mailed Advance Directive packet and have notarized   Start Date: 04/12/2020  This Visit's Progress: On track  Priority: Medium  Current barriers:   . Patient does not have an advance directive  . Needs education, support and coordination in order to meet this need Interventions: . Collaboration with PCP regarding development and update of comprehensive plan of care as evidenced by provider attestation and co-signature . Inter-disciplinary care team collaboration (see longitudinal plan of care) . Assessed understanding of Advance Directives . A voluntary discussion about advanced care planning including importance of advanced directives, healthcare proxy and living will was discussed with the patient.  Elease Etienne patient EMMI educational information on Advance Directives as well as mailed an Advance Directive packet Patient Goals/Self-Care Activities : Over the next 21 days, patient will: . Review e-mailed EMMI education on Advance Directive  . Complete Advance Directive packet,  . Call LCSW if you have questions  . Have advance directive notarized and provide a copy to provider office . Discuss with sister Follow Up Plan:f/u by phone with LCSW in 3 weeks     [name], [place] / [place]   [phone] 12:01 PM

## 2020-05-10 ENCOUNTER — Ambulatory Visit: Payer: Medicare HMO | Admitting: Licensed Clinical Social Worker

## 2020-05-10 NOTE — Chronic Care Management (AMB) (Signed)
Care Management Clinical Social Work Note  05/10/2020 Name: Shannon Stuart MRN: [id number] DOB: 12/18/1960  Shannon Stuart is a 60 y.o. year old female who is a primary care patient of Hensel, Shannon Collin, MD.  The Care Management team was consulted for assistance with chronic disease management and coordination needs.  Engaged with patient by telephone for follow up visit in response to provider referral for social work chronic care management and care coordination services  Consent to Services:  Patient agreed to services and consent obtained.   Assessment: Patient continues to experience difficulty with locating affordable housing options.. Patient is making progress with managing her emotions which seems to be exacerbated by her housing concern.. See Care Plan below for interventions and patient self-care actives. Recent life changes: has been able to make progress with housing support. Recommendation: Patient may benefit from, and is in agreement to implement interventions discussed during this encounter.  Follow up Plan: Patient would like continued follow-up.  CCM LCSW will f/u with patient in 4 weeks. 06/07/20. Patient will call office if needed prior to next encounter   Review of patient past medical history, allergies, medications, and health status, including review of relevant consultants reports was performed today as part of a comprehensive evaluation and provision of chronic care management and care coordination services.  SDOH (Social Determinants of Health) assessments and interventions performed:  SDOH Interventions   Flowsheet Row Most Recent Value  SDOH Interventions   Stress Interventions Provide Counseling       Advanced Directives Status: See Care Plan for related entries.  Care Plan  Allergies  Allergen Reactions  . Morphine And Related Other (See Comments)    Makes "loopy" and chest "feel funny".   Tresa  Hcl] Nausea And Vomiting    Outpatient  Encounter Medications as of 05/10/2020  Medication Sig  . ALPRAZolam (XANAX) 1 MG tablet Take 1 tablet (1 mg total) by mouth 3 (three) times daily.  Marland Kitchen aspirin EC 81 MG tablet Take 81 mg by mouth daily.  . cholecalciferol (VITAMIN D3) 25 MCG (1000 UNIT) tablet Take 3,000 Units by mouth daily.  . diclofenac sodium (VOLTAREN) 1 % GEL Apply 1 application topically daily as needed (pain).  Marland Kitchen lisinopril (PRINIVIL,ZESTRIL) 40 MG tablet Take 1 tablet (40 mg total) by mouth daily.  . Omega-3 Fatty Acids (FISH OIL) 1000 MG CAPS Take 1,000 mg by mouth daily.   . pantoprazole (PROTONIX) 40 MG tablet Take 1 tablet (40 mg total) by mouth daily.  . potassium chloride SA (KLOR-CON) 20 MEQ tablet Take 1 tablet (20 mEq total) by mouth daily.  . promethazine (PHENERGAN) 25 MG suppository Use 1 suppository every 6-8 hours as needed (Patient not taking: Reported on 08/09/2019)  . rosuvastatin (CRESTOR) 20 MG tablet Take 1 tablet (20 mg total) by mouth daily.  . traZODone (DESYREL) 100 MG tablet Take 100 mg by mouth at bedtime.    No facility-administered encounter medications on file as of 05/10/2020.    Patient Active Problem List   Diagnosis Date Noted  . Screening cholesterol level 02/02/2020  . Shoulder pain   . Emesis 11/13/2018  . Generalized abdominal pain   . Intractable vomiting   . Diarrhea with dehydration 11/11/2018  . Leukocytosis   . Elevated serum creatinine 03/06/2017  . Skin lesion of right arm 01/02/2017  . Fingernail abnormalities 01/02/2017  . Rash 12/31/2016  . Changes in vision 09/20/2016  . Amplified musculoskeletal pain, diffuse 09/20/2016  . Trigger point  of extremity 09/20/2016  . Chronic diarrhea 09/20/2016  . Chronic bilateral lower abdominal pain 09/20/2016  . 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 Hypokalemia due to excessive gastrointestinal loss of potassium 08/30/2016  . Bone lesion 08/30/2016  . Non-obstructive CAD   . Obesity, Class II, BMI 35-39.9   .  Chronic low back pain 10/12/2013  . Well woman exam 09/02/2013  . Prediabetes 06/02/2013  . Trigger point of shoulder region 02/20/2013  . Insomnia 08/18/2012  . Arthritis of both knees 04/11/2011  . Lower extremity edema 09/24/2010  . Depression 08/10/2010  . HLD (hyperlipidemia) 08/04/2009  . Leiomyoma of uterus 04/29/2008  . Dysfunctional uterine bleeding 04/26/2008  . Anxiety with depression 09/15/2006  . RHINITIS, ALLERGIC NOS 09/08/2006  . GERD 09/08/2006  . HYPERTENSION, BENIGN ESSENTIAL 08/04/2006    Conditions to be addressed/monitored: Anxiety and Depression; Housing barriers  Care Plan : Clinical Social Work  Updates made by Maurine Cane, LCSW since 05/10/2020 12:00 AM  Problem: Coping Skills/ managing symptoms of anxiety & Depression   Priority: High  Long-Range Goal: Coping Skills Enhanced /patient will continue to work with SW to reduce or manage symptoms of anxiety, depression, and stress.   Start Date: 02/04/2020  Expected End Date: 07/20/2020  Recent Progress: On track  Priority: High  Current Barriers:   . Often feels like she takes a step backward when trying to move forward . Continues to utilize faith support from pastor, prayer and Positive affirmations.  . Patient needs Support, Education, and Care Coordination in order to meet unmet mental health needs  Interventions:  . Assessed patient's needs, coping skills, progress with management and barriers to care . Reviewed mental health support, education and interventions EMMI Education (Relieving Stress and Breathing to relax)  . Active listening utilized and solution focused interventions   . Decision-making supported with self reflection, and problem solving  . Continues with spiritual activities  . Verbalization of feelings encouraged with incorporating journaling  . Other interventions include: Motivational Interviewing Behavioral Activation ;task centered approach Patient Goals/Self-Care Activities:  Over the next 30 days work on the following . Continue with your goals: increase activity, being positive, faith support and prayer . implement interventions discussed today to decreases symptoms of anxiety, depression, and stress and increase knowledge and/or ability of: coping skills, healthy habits, self-management skills, and stress reduction. . Continue to review EMMI educational information (Relieving Stress and breathing to relax) . Added EMMI:  Movement: Emotional Health . Relaxed breathing 3 times daily, daily affirmations and Journal your thoughts  Follow Up Plan:  . SW will follow up with patient by phone over the next 4 weeks   Problem: Barriers to Care   Long-Range Goal: Over the next 90 days, patient will explore options for permanent housing   Start Date: 02/16/2020  Expected End Date: 07/21/2020  Recent Progress: On track  Priority: High  Current Barriers:   . Unable to locate affordable housing  . Received call from [employer]  . Has decided not to mover forward with Vocational Rehabilitation; putting it on hold for now . Patient has spoken to [employer] but has not been able to move forward with completing things she needs to do . needs assistance with locating housing acknowledges deficits with meeting this unmet need Interventions:  . Assessment of needs, and discussed barriers  as well as how impacting  . Review various resources and discussed options which included advocating for self and follow through  .  Reviewed update from Referral placed via [email or web address] . Other interventions provided:Solution-Focused Strategies, Emotional/Supportive Counseling, and Problem Solving;Problem Solving ;  Patient Goals/Self-Care Activities:  . Continue to follow up with [place] to discuss housing options  Follow Up Plan:  . SW will follow up with patient by phone over the next 4 weeks   Problem: Long-Term  Care Planning   Long-Range Goal: Effective Long-Term Care Planning /Over the next 30 days, the patient will review mailed EMMI education on Advance Directive, mailed Advance Directive packet and have notarized   Start Date: 04/12/2020  This Visit's Progress: Not on track  Recent Progress: On track  Priority: Medium  Current barriers:   . Received advance directive packet and discussed with sister; not ready to move forward . Patient does not have an advance directive  . Needs education, support and coordination in order to meet this need Interventions: . Collaboration with PCP regarding development and update of comprehensive plan of care as evidenced by provider attestation and co-signature . Inter-disciplinary care team collaboration (see longitudinal plan of care) . Assessed understanding of Advance Directives . A voluntary discussion about advanced care planning including importance of advanced directives, healthcare proxy and living will was discussed with the patient.  Elease Etienne patient EMMI educational information on Advance Directives as well as mailed an Advance Directive packet Patient Goals/Self-Care Activities :  patient will: . Review e-mailed EMMI education on Advance Directive  . Complete Advance Directive packet,  . Call LCSW if you have questions  . Have advance directive notarized and provide a copy to provider office Follow Up Plan:f/u by phone with LCSW in 4 weeks       [name], [place] / [place]   [phone] 11:09 AM

## 2020-05-10 NOTE — Patient Instructions (Signed)
Visit Information  Goals Addressed            This Visit's Progress   . Effective Long-Term Care Planning   Not on track    Timeframe:  Short-Term Goal Priority:  Medium Start Date:   04/12/2020                          Expected End Date:  10/22/2020    Patient Goals/Self-Care Activities:  . Review e-mailed EMMI education on Advance Directive  . Complete Advance Directive packet,  . Call LCSW if you have questions  . Have advance directive notarized and provide a copy to provider office when you are ready to move forward           . locate housing   On track    Timeframe:  Long-Range Goal Priority:  High Start Date:     02/04/20                        Expected End Date:   on going                     Patient Goals/Self-Care Activities:  . Follow up with [place] to discuss housing options      . Manage My Emotions   On track     Timeframe:  Long-Range Goal Priority:  High Start Date:   02/04/2020                         Expected End Date:  on going                       Patient Goals/Self-Care Activities:  . Continue with your goals: increase activity, being positive, faith support and prayer . implement interventions discussed today to decreases symptoms of anxiety, depression, and stress and increase knowledge and/or ability of: coping skills, healthy habits, self-management skills, and stress reduction. . Review EMMI educational information (Relieving Stress and breathing to relax) . Added EMMI:  Movement: Emotional Health . Relaxed breathing 3 times daily; daily affirmations and Journal your thoughts       Patient verbalizes understanding of instructions provided today and agrees to view in [place].   Telephone follow up appointment with care management team member scheduled for:06/06/20 Maurine Cane, LCSW

## 2020-06-07 ENCOUNTER — Other Ambulatory Visit (HOSPITAL_COMMUNITY)
Admission: RE | Admit: 2020-06-07 | Discharge: 2020-06-07 | Disposition: A | Discharge status: Home / Self Care | Payer: Medicare HMO | Source: Ambulatory Visit | Attending: Family Medicine | Admitting: Family Medicine

## 2020-06-07 ENCOUNTER — Encounter: Payer: Self-pay | Admitting: Family Medicine

## 2020-06-07 ENCOUNTER — Ambulatory Visit: Payer: Medicare HMO | Admitting: Licensed Clinical Social Worker

## 2020-06-07 ENCOUNTER — Other Ambulatory Visit: Payer: Self-pay

## 2020-06-07 ENCOUNTER — Ambulatory Visit (INDEPENDENT_AMBULATORY_CARE_PROVIDER_SITE_OTHER): Payer: Medicare HMO | Admitting: Family Medicine

## 2020-06-07 VITALS — BP 104/72 | HR 84 | Ht 68.0 in | Wt 218.0 lb

## 2020-06-07 DIAGNOSIS — Z124 Encounter for screening for malignant neoplasm of cervix: Secondary | ICD-10-CM | POA: Insufficient documentation

## 2020-06-07 DIAGNOSIS — Z0000 Encounter for general adult medical examination without abnormal findings: Secondary | ICD-10-CM | POA: Insufficient documentation

## 2020-06-07 DIAGNOSIS — Z1239 Encounter for other screening for malignant neoplasm of breast: Secondary | ICD-10-CM | POA: Insufficient documentation

## 2020-06-07 DIAGNOSIS — Z719 Counseling, unspecified: Secondary | ICD-10-CM

## 2020-06-07 DIAGNOSIS — D329 Benign neoplasm of meninges, unspecified: Secondary | ICD-10-CM | POA: Diagnosis not present

## 2020-06-07 DIAGNOSIS — M170 Bilateral primary osteoarthritis of knee: Secondary | ICD-10-CM

## 2020-06-07 DIAGNOSIS — R5383 Other fatigue: Secondary | ICD-10-CM | POA: Diagnosis not present

## 2020-06-07 DIAGNOSIS — R7303 Prediabetes: Secondary | ICD-10-CM | POA: Diagnosis not present

## 2020-06-07 DIAGNOSIS — F418 Other specified anxiety disorders: Secondary | ICD-10-CM | POA: Diagnosis not present

## 2020-06-07 DIAGNOSIS — F32A Depression, unspecified: Secondary | ICD-10-CM

## 2020-06-07 DIAGNOSIS — I10 Essential (primary) hypertension: Secondary | ICD-10-CM | POA: Diagnosis not present

## 2020-06-07 DIAGNOSIS — E782 Mixed hyperlipidemia: Secondary | ICD-10-CM

## 2020-06-07 DIAGNOSIS — F439 Reaction to severe stress, unspecified: Secondary | ICD-10-CM

## 2020-06-07 DIAGNOSIS — Z1151 Encounter for screening for human papillomavirus (HPV): Secondary | ICD-10-CM | POA: Insufficient documentation

## 2020-06-07 DIAGNOSIS — Z01419 Encounter for gynecological examination (general) (routine) without abnormal findings: Secondary | ICD-10-CM

## 2020-06-07 LAB — POCT GLYCOSYLATED HEMOGLOBIN (HGB A1C): HbA1c, POC (controlled diabetic range): 6.1 % (ref 0.0–7.0)

## 2020-06-07 MED ORDER — ALPRAZOLAM 1 MG PO TABS: 1.0000 mg | ORAL_TABLET | Freq: Three times a day (TID) | ORAL | 5 refills | Status: DC

## 2020-06-07 MED ORDER — DICLOFENAC SODIUM 1 % EX GEL: 2.0000 g | Freq: Four times a day (QID) | CUTANEOUS | 6 refills | Status: DC

## 2020-06-07 NOTE — Assessment & Plan Note (Signed)
And weight gain.  Check TSH.

## 2020-06-07 NOTE — Assessment & Plan Note (Signed)
Pap done with co testing.  Maybe her last pap ever. Also ordered mammogram.

## 2020-06-07 NOTE — Assessment & Plan Note (Signed)
Recheck A1C with weight gain.

## 2020-06-07 NOTE — Assessment & Plan Note (Signed)
Refilled voltaren gel per her request

## 2020-06-07 NOTE — Patient Instructions (Signed)
  Shannon Stuart  it was nice speaking with you. Please call me directly [phone] if you have questions about the goals we discussed. Goals Addressed            This Visit's Progress   . locate housing   Not on track    Timeframe:  Long-Range Goal Priority:  High Start Date:     02/04/20                        Expected End Date:   on going                     Patient Goals/Self-Care Activities:  . Continue to follow up with [employer] for list of section 8 housing options . F/u with Housing Authority to discuss housing options     . Manage My Emotions   On track     Timeframe:  Long-Range Goal Priority:  High Start Date:   02/04/2020                         Expected End Date:  on going                       Patient Goals/Self-Care Activities:  . Continue with your goals: increase activity, being positive, faith support and prayer . Continue with coping strategies (Relaxed breathing 3 times daily and daily affirmations  . Managing thoughts (in my control/ out of my control)       Shannon Stuart received Care Coordination services today:  1. Care Coordination services include personalized support from designated clinical staff supervised by her physician, including individualized plan of care and coordination with other care providers 2. 24/7 contact [phone] for assistance for urgent and routine care needs. 3. Care Coordination are voluntary services and be declined at any time by calling the office.  Patient verbalizes understanding of instructions provided today.    Follow up plan: Appointment scheduled for SW follow up with client by phone on: 07/12/20  Maurine Cane, LCSW

## 2020-06-07 NOTE — Progress Notes (Signed)
    SUBJECTIVE:   CHIEF COMPLAINT / HPI:   Multiple issues 1. Due for pap.  No hx of abnormal paps.  Still has uterus.  Menopausal.  No post menopausal bleeding. 2. Due for mammo screening 3. Needs refill xanax.  Anxiety is reasonable at this point. 4. High cholesterol.  On statin.  Needs d LDL to assess effectiveness of statin. 5. Hx of meningioma x 2.  Was not on problem list but I found MRI findings from one year ago.  Told needed six month FU. 6. Decreased energy.  No focal symptoms.  Wt gain.   OBJECTIVE:   BP 104/72   Pulse 84   Ht 5\' 8"  (1.727 m)   Wt 218 lb (98.9 kg)   SpO2 97%   BMI 33.15 kg/m   Lungs clear Cardiac RRR without m or g Abd benign. Pelvic WNL.  Normal cervix Pap taken, normal non tender bimanual Neuro, motor, sensory, affect, gait and cognition normal  ASSESSMENT/PLAN:   HLD (hyperlipidemia) Check d LDL  Anxiety with depression Refilled xanax  Arthritis of both knees Refilled voltaren gel per her request  Prediabetes Recheck A1C with weight gain.  Meningioma ([place]) Ordered MRI.  Fatigue And weight gain.  Check TSH.  Well woman exam Pap done with co testing.  Maybe her last pap ever. Also ordered mammogram.     Zenia Resides, MD [place]

## 2020-06-07 NOTE — Chronic Care Management (AMB) (Signed)
Care Management Clinical Social Work Note  06/07/2020 Name: ARNA LUIS MRN: [id number] DOB: [birth year]  [name] is a 60 y.o. year old female who is a primary care patient of Hensel, Jamal Collin, MD.  The Care Management team was consulted for assistance with chronic disease management and coordination needs.  Engaged with patient by telephone for follow up visit in response to provider referral for social work chronic care management and care coordination services  Consent to Services: Patient agreed to services and consent obtained.   Assessment:  Patient continues to experience difficulty with locating housing due to several barriers out of her control.[name] is making progress with managing her symptoms of depression and emotions. (See Care Plan below for interventions and patient self-care actives.  Follow up Plan: Patient would like continued follow-up.  CCM LCSW will f/u with patient in 4 weeks. Patient will call office if needed prior to next encounter Review of patient past medical history, allergies, medications, and health status, including review of relevant consultants reports was performed today as part of a comprehensive evaluation and provision of chronic care management and care coordination services.  SDOH (Social Determinants of Health) assessments and interventions performed:    Advanced Directives Status: Not addressed in this encounter.  Care Plan  Allergies  Allergen Reactions  . Morphine And Related Other (See Comments)    Makes "loopy" and chest "feel funny".   Tresa  Hcl] Nausea And Vomiting    Outpatient Encounter Medications as of 06/07/2020  Medication Sig  . ALPRAZolam (XANAX) 1 MG tablet Take 1 tablet (1 mg total) by mouth 3 (three) times daily.  Marland Kitchen aspirin EC 81 MG tablet Take 81 mg by mouth daily.  . cholecalciferol (VITAMIN D3) 25 MCG (1000 UNIT) tablet Take 3,000 Units by mouth daily.  . diclofenac sodium (VOLTAREN) 1 % GEL Apply 1  application topically daily as needed (pain).  Marland Kitchen lisinopril (PRINIVIL,ZESTRIL) 40 MG tablet Take 1 tablet (40 mg total) by mouth daily.  . Omega-3 Fatty Acids (FISH OIL) 1000 MG CAPS Take 1,000 mg by mouth daily.   . pantoprazole (PROTONIX) 40 MG tablet Take 1 tablet (40 mg total) by mouth daily.  . potassium chloride SA (KLOR-CON) 20 MEQ tablet Take 1 tablet (20 mEq total) by mouth daily.  . promethazine (PHENERGAN) 25 MG suppository Use 1 suppository every 6-8 hours as needed (Patient not taking: Reported on 08/09/2019)  . rosuvastatin (CRESTOR) 20 MG tablet Take 1 tablet (20 mg total) by mouth daily.  . traZODone (DESYREL) 100 MG tablet Take 100 mg by mouth at bedtime.    No facility-administered encounter medications on file as of 06/07/2020.    Patient Active Problem List   Diagnosis Date Noted  . Screening cholesterol level 02/02/2020  . Shoulder pain   . Emesis 11/13/2018  . Generalized abdominal pain   . Intractable vomiting   . Diarrhea with dehydration 11/11/2018  . Leukocytosis   . Elevated serum creatinine 03/06/2017  . Skin lesion of right arm 01/02/2017  . Fingernail abnormalities 01/02/2017  . Rash 12/31/2016  . Changes in vision 09/20/2016  . Amplified musculoskeletal pain, diffuse 09/20/2016  . Trigger point of extremity 09/20/2016  . Chronic diarrhea 09/20/2016  . Chronic bilateral lower abdominal pain 09/20/2016  . 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 Hypokalemia due to excessive gastrointestinal loss of potassium 08/30/2016  . Bone lesion 08/30/2016  . Non-obstructive CAD   . Obesity, Class II, BMI 35-39.9   .  Chronic low back pain 10/12/2013  . Well woman exam 09/02/2013  . Prediabetes 06/02/2013  . Trigger point of shoulder region 02/20/2013  . Insomnia 08/18/2012  . Arthritis of both knees 04/11/2011  . Lower extremity edema 09/24/2010  . Depression 08/10/2010  . HLD (hyperlipidemia) 08/04/2009  . Leiomyoma of uterus 04/29/2008  .  Dysfunctional uterine bleeding 04/26/2008  . Anxiety with depression 09/15/2006  . RHINITIS, ALLERGIC NOS 09/08/2006  . GERD 09/08/2006  . HYPERTENSION, BENIGN ESSENTIAL 08/04/2006    Conditions to be addressed/monitored: Anxiety, Depression and stress; Housing barriers  Care Plan : Clinical Social Work  Updates made by Maurine Cane, LCSW since 06/07/2020 12:00 AM  Problem: Coping Skills/ managing symptoms of anxiety & Depression   Priority: High  Long-Range Goal: Coping Skills Enhanced /patient will continue to work with SW to reduce or manage symptoms of anxiety, depression, and stress.   Start Date: 02/04/2020  Recent Progress: On track  Priority: High  Current Barriers:   . Often feels like she takes a step backward when trying to move forward . Continues to utilize faith support from pastor, prayer and Positive affirmations.  . Patient needs Support, Education, and Care Coordination in order to meet unmet mental health needs  Interventions:  . Assessed patient's needs, coping skills, progress with management and barriers to care . Reviewed mental health support, education and coping interventions (Relieving Stress and Breathing to relax)  . Active listening utilized and solution focused interventions   . Decision-making supported with self reflection, and problem solving ( in my control/ out of my control) . Continues with spiritual activities . Other interventions include: Motivational Interviewing Behavioral Activation ;task centered approach Patient Goals/Self-Care Activities: Over the next 30 days work on the following . Continue with your goals: increase activity, being positive, faith support and prayer . Continue with coping strategies (Relaxed breathing 3 times daily and daily affirmations  . Managing thoughts (in my control/ out of my control) Follow Up Plan:  . SW will follow up with patient by phone over the next 4 weeks   Problem: Barriers to Care   Long-Range  Goal: Over the next 90 days, patient will explore options for permanent housing   Start Date: 02/16/2020  Expected End Date: 07/21/2020  Recent Progress: On track  Priority: High  Current Barriers:   . Unable to locate affordable housing that takes section 8 . Received call from [facility]/ needs to follow up  . Has decided not to mover forward with Vocational Rehabilitation; putting it on hold for now . Patient has spoken to [employer] but has not been able to move forward with completing things she needs to do . needs assistance with locating housing acknowledges deficits with meeting this unmet need Interventions:  . Assessment of needs, and discussed barriers  as well as how impacting  . Review various resources and discussed options which included advocating for self and follow through  . Reviewed updates from Referral placed via [place] and encouraged to f/u to get additional section 8 housing options  . Other interventions provided:Solution-Focused Strategies, Emotional/Supportive Counseling, and Problem Solving;Problem Solving ;  Patient Goals/Self-Care Activities:  . Continue to follow up with [employer] for list of section 8 housing options . F/u with Housing Authority to discuss housing options  Follow Up Plan:  . SW will follow up with patient by phone over the next 4 weeks     [name], LCSW Care Management & Coordination  [place] / [place]   [phone] 12:33 PM

## 2020-06-07 NOTE — Patient Instructions (Addendum)
Please stop taking the daily aspirin Someone should call for the brain MRI. I will call with the results of the blood work. I refilled the xanax at [facility] You are due for a mammogram.  Someone should call. Prescriptions sent

## 2020-06-07 NOTE — Assessment & Plan Note (Signed)
Refilled xanax

## 2020-06-07 NOTE — Assessment & Plan Note (Signed)
Ordered MRI 

## 2020-06-07 NOTE — Assessment & Plan Note (Signed)
Check d LDL

## 2020-06-08 LAB — CMP14+EGFR
ALT: 22 IU/L (ref 0–32)
AST: 20 IU/L (ref 0–40)
Albumin/Globulin Ratio: 2.2 (ref 1.2–2.2)
Albumin: 4.7 g/dL (ref 3.8–4.8)
Alkaline Phosphatase: 96 IU/L (ref 44–121)
BUN/Creatinine Ratio: 21 (ref 12–28)
BUN: 23 mg/dL (ref 8–27)
Bilirubin Total: 0.2 mg/dL (ref 0.0–1.2)
CO2: 22 mmol/L (ref 20–29)
Calcium: 9.3 mg/dL (ref 8.7–10.3)
Chloride: 100 mmol/L (ref 96–106)
Creatinine, Ser: 1.11 mg/dL — ABNORMAL HIGH (ref 0.57–1.00)
Globulin, Total: 2.1 g/dL (ref 1.5–4.5)
Glucose: 89 mg/dL (ref 65–99)
Potassium: 4.4 mmol/L (ref 3.5–5.2)
Sodium: 138 mmol/L (ref 134–144)
Total Protein: 6.8 g/dL (ref 6.0–8.5)
eGFR: 57 mL/min/{1.73_m2} — ABNORMAL LOW (ref >59)

## 2020-06-08 LAB — CYTOLOGY - PAP
Adequacy: ABSENT
Comment: NEGATIVE
Diagnosis: NEGATIVE
High risk HPV: NEGATIVE

## 2020-06-08 LAB — CBC
Hematocrit: 38.4 % (ref 34.0–46.6)
Hemoglobin: 12.6 g/dL (ref 11.1–15.9)
MCH: 28.6 pg (ref 26.6–33.0)
MCHC: 32.8 g/dL (ref 31.5–35.7)
MCV: 87 fL (ref 79–97)
Platelets: 216 10*3/uL (ref 150–450)
RBC: 4.41 x10E6/uL (ref 3.77–5.28)
RDW: 15.3 % (ref 11.7–15.4)
WBC: 8.2 10*3/uL (ref 3.4–10.8)

## 2020-06-08 LAB — LDL CHOLESTEROL, DIRECT: LDL Direct: 70 mg/dL (ref 0–99)

## 2020-06-08 LAB — TSH: TSH: 1.4 u[IU]/mL (ref 0.450–4.500)

## 2020-06-19 DIAGNOSIS — M5416 Radiculopathy, lumbar region: Secondary | ICD-10-CM | POA: Diagnosis not present

## 2020-06-24 ENCOUNTER — Other Ambulatory Visit: Payer: Self-pay

## 2020-06-24 ENCOUNTER — Ambulatory Visit
Admission: RE | Admit: 2020-06-24 | Discharge: 2020-06-24 | Disposition: A | Discharge status: Home / Self Care | Payer: Medicare HMO | Source: Ambulatory Visit | Attending: Family Medicine | Admitting: Family Medicine

## 2020-06-24 DIAGNOSIS — D329 Benign neoplasm of meninges, unspecified: Secondary | ICD-10-CM | POA: Diagnosis not present

## 2020-06-24 DIAGNOSIS — Z9889 Other specified postprocedural states: Secondary | ICD-10-CM | POA: Diagnosis not present

## 2020-06-24 DIAGNOSIS — J3489 Other specified disorders of nose and nasal sinuses: Secondary | ICD-10-CM | POA: Diagnosis not present

## 2020-06-24 DIAGNOSIS — G9389 Other specified disorders of brain: Secondary | ICD-10-CM | POA: Diagnosis not present

## 2020-07-11 DIAGNOSIS — F1120 Opioid dependence, uncomplicated: Secondary | ICD-10-CM | POA: Diagnosis not present

## 2020-07-11 DIAGNOSIS — Z6833 Body mass index (BMI) 33.0-33.9, adult: Secondary | ICD-10-CM | POA: Diagnosis not present

## 2020-07-11 DIAGNOSIS — M5136 Other intervertebral disc degeneration, lumbar region: Secondary | ICD-10-CM | POA: Diagnosis not present

## 2020-07-11 DIAGNOSIS — I10 Essential (primary) hypertension: Secondary | ICD-10-CM | POA: Diagnosis not present

## 2020-07-12 ENCOUNTER — Ambulatory Visit: Payer: Medicare HMO | Admitting: Licensed Clinical Social Worker

## 2020-07-12 NOTE — Patient Instructions (Addendum)
Visit Information  Goals Addressed            This Visit's Progress   . Find Help in my community   Not on track    Timeframe:  Long-Range Goal Priority:  High Start Date:     02/04/20                        Expected End Date:   on going                     Patient Goals/Self-Care Activities:  . Continue to follow up with [employer] for list of section 8 housing options . F/u with Housing Authority to discuss housing options  . Follow up with [employer] for community food options       . Manage My Emotions   On track     Timeframe:  Long-Range Goal Priority:  High Start Date:   02/04/2020                         Expected End Date:  on going                       Patient Goals/Self-Care Activities:  . avoid negative self-talk . spend time or talk with others every day . laugh; watch a funny movie or comedian . talk about feelings with a friend, family or spiritual advisor . practice positive thinking and self-talk      Patient verbalizes understanding of instructions provided today and agrees to view in [place].   Telephone follow up appointment with care management team member scheduled for:08/09/20  Raeleigh Guinn, House Management & Coordination  [phone]

## 2020-07-12 NOTE — Chronic Care Management (AMB) (Signed)
Chronic Care Management    Clinical Social Work Note  07/12/2020 Name: Shannon Stuart MRN: [id number] DOB: February 27, 1961  Shannon Stuart is a 60 y.o. year old female who is a primary care patient of Hensel, Jamal Collin, MD. The CCM team was consulted to assist the patient with chronic disease management and/or care coordination needs related to: Food Insecurity and housing barriers.   Engaged with patient by telephone for follow up visit in response to provider referral for social work chronic care management and care coordination services.   Consent to Services:  The patient was given information about Chronic Care Management services, agreed to services, and gave verbal consent prior to initiation of services.  Please see initial visit note for detailed documentation.   Patient agreed to services and consent obtained.   Assessment: Patient is making progress with managing symptoms of depression and stress. . See Care Plan below for interventions and patient self-care actives. Recent life changes /stressors: limited resources; continue housing barriers Recommendation: Patient may benefit from, and is in agreement to explore food resources provided.  Follow up Plan: Patient would like continued follow-up.  CCM LCSW will f/u with patient in 30 days.. Patient will call office if needed prior to next encounter.  Review of patient past medical history, allergies, medications, and health status, including review of relevant consultants reports was performed today as part of a comprehensive evaluation and provision of chronic care management and care coordination services.     SDOH (Social Determinants of Health) assessments and interventions performed:  SDOH Interventions   Flowsheet Row Most Recent Value  SDOH Interventions   Food Insecurity Interventions Other (Comment)  [food resources provided]       Advanced Directives Status: Not addressed in this encounter.  CCM Care Plan  Allergies   Allergen Reactions  . Morphine And Related Other (See Comments)    Makes "loopy" and chest "feel funny".   Tresa  Hcl] Nausea And Vomiting    Outpatient Encounter Medications as of 07/12/2020  Medication Sig  . ALPRAZolam (XANAX) 1 MG tablet Take 1 tablet (1 mg total) by mouth 3 (three) times daily.  Marland Kitchen amLODipine (NORVASC) 5 MG tablet Take 5 mg by mouth daily.  Marland Kitchen aspirin EC 81 MG tablet Take 81 mg by mouth daily.  . cholecalciferol (VITAMIN D3) 25 MCG (1000 UNIT) tablet Take 3,000 Units by mouth daily.  . cyclobenzaprine (FLEXERIL) 10 MG tablet Take 10 mg by mouth 3 (three) times daily.  . diclofenac Sodium (VOLTAREN) 1 % GEL Apply 2 g topically 4 (four) times daily.  Marland Kitchen ibuprofen (ADVIL) 800 MG tablet Take 800 mg by mouth 3 (three) times daily.  Marland Kitchen lisinopril (PRINIVIL,ZESTRIL) 40 MG tablet Take 1 tablet (40 mg total) by mouth daily.  . Omega-3 Fatty Acids (FISH OIL) 1000 MG CAPS Take 1,000 mg by mouth daily.   . pantoprazole (PROTONIX) 40 MG tablet Take 1 tablet (40 mg total) by mouth daily.  . potassium chloride SA (KLOR-CON) 20 MEQ tablet Take 1 tablet (20 mEq total) by mouth daily.  . rosuvastatin (CRESTOR) 20 MG tablet Take 1 tablet (20 mg total) by mouth daily.  . traZODone (DESYREL) 100 MG tablet Take 100 mg by mouth at bedtime.   . vitamin B-12 (CYANOCOBALAMIN) 100 MCG tablet Take 100 mcg by mouth daily.   No facility-administered encounter medications on file as of 07/12/2020.    Patient Active Problem List   Diagnosis Date Noted  . Meningioma ([place]) 06/07/2020  .  Fatigue 06/07/2020  . Breast cancer screening 06/07/2020  . Shoulder pain   . Emesis 11/13/2018  . Generalized abdominal pain   . Intractable vomiting   . Diarrhea with dehydration 11/11/2018  . Leukocytosis   . Elevated serum creatinine 03/06/2017  . Skin lesion of right arm 01/02/2017  . Fingernail abnormalities 01/02/2017  . Rash 12/31/2016  . Changes in vision 09/20/2016  . Amplified  musculoskeletal pain, diffuse 09/20/2016  . Trigger point of extremity 09/20/2016  . Chronic diarrhea 09/20/2016  . Chronic bilateral lower abdominal pain 09/20/2016  . 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 Hypokalemia due to excessive gastrointestinal loss of potassium 08/30/2016  . Bone lesion 08/30/2016  . Non-obstructive CAD   . Obesity, Class II, BMI 35-39.9   . Chronic low back pain 10/12/2013  . Well woman exam 09/02/2013  . Prediabetes 06/02/2013  . Trigger point of shoulder region 02/20/2013  . Insomnia 08/18/2012  . Arthritis of both knees 04/11/2011  . Lower extremity edema 09/24/2010  . Depression 08/10/2010  . HLD (hyperlipidemia) 08/04/2009  . Leiomyoma of uterus 04/29/2008  . Dysfunctional uterine bleeding 04/26/2008  . Anxiety with depression 09/15/2006  . RHINITIS, ALLERGIC NOS 09/08/2006  . GERD 09/08/2006  . HYPERTENSION, BENIGN ESSENTIAL 08/04/2006    Conditions to be addressed/monitored: Anxiety and Depression; Limited access to food and Housing barriers  Care Plan : Clinical Social Work  Updates made by Maurine Cane, LCSW since 07/12/2020 12:00 AM  Problem: Coping Skills/ managing symptoms of anxiety & Depression   Priority: High  Long-Range Goal: Coping Skills Enhanced /patient will continue to work with SW to reduce or manage symptoms of anxiety, depression, and stress.   Start Date: 02/04/2020  This Visit's Progress: On track  Recent Progress: On track  Priority: High  Current barriers:   . Chronic Mental Health needs related to symptoms of depression  Needs Support, Education, and Care Coordination in order to meet unmet mental health needs. Clinical Goal(s): patient will work with SW to reduce or manage symptoms of anxiety, depression, and stress and increase knowledge and/or ability of: coping skills, self-management skills, and stress reduction.  Clinical Interventions:  . Assessed patient's needs, coping skills,support system,  progress and barriers to care  . Provided interventions: Solution-Focused Strategies, Active listening / Reflection utilized , Emotional Supportive Provided, Behavioral Activation, Problem Solving /Task Center , Increase in actives / exercise encouraged (spending time with friends ), and Verbalization of feelings encouraged  ; . Collaboration with PCP regarding development and update of comprehensive plan of care as evidenced by provider attestation and co-signature . Inter-disciplinary care team collaboration (see longitudinal plan of care) Patient Goals/Self-Care Activities: Over the next 30 days . avoid negative self-talk . spend time or talk with others every day . laugh; watch a funny movie or comedian . talk about feelings with a friend, family or spiritual advisor . practice positive thinking and self-talk   Problem: Barriers to Care   Long-Range Goal: patient will explore options for permanent housing and food options   Start Date: 02/16/2020  This Visit's Progress: Not on track  Recent Progress: On track  Priority: High  Current barriers:   . Patient in need of assistance with connecting to community resources for Limited access to food and Housing barriers . Unable to locate affordable housing that takes [address] . Patient is unable to independently navigate community resource options without care coordination support Clinical Goals: Patient will work with agencies discussed to address  related needs. Clinical Interventions:  . Collaboration with Zenia Resides, MD regarding development and update of comprehensive plan of care as evidenced by provider attestation and co-signature . Inter-disciplinary care team collaboration (see longitudinal plan of care) . Assessment of needs, barriers , agencies contacted, as well as how impacting  . Review various resources, discussed options and provided patient information about Department of Social Services (food stamps, [place], and  utilities assistance), [employer] options , Housing resources , and Referral for [employer] . Referral placed via [facility] [employer] . Other interventions provided:Solution-Focused Strategies, Active listening / Reflection utilized , and Problem [place]  Patient Goals/Self-Care Activities: Over the next 30 days . Follow up with [employer] for community food options  . Follow up with [facility] for housing options       [name], [place] / [place]   [phone] 2:35 PM

## 2020-08-09 ENCOUNTER — Ambulatory Visit: Payer: Medicare HMO | Admitting: Licensed Clinical Social Worker

## 2020-08-09 DIAGNOSIS — F439 Reaction to severe stress, unspecified: Secondary | ICD-10-CM

## 2020-08-09 DIAGNOSIS — Z719 Counseling, unspecified: Secondary | ICD-10-CM

## 2020-08-09 NOTE — Chronic Care Management (AMB) (Signed)
Care Management   Clinical Social Work Note  08/09/2020 Name: Shannon Stuart MRN: [id number] DOB: [birth year]  Shannon Stuart is a 60 y.o. year old female who is a primary care patient of Hensel, Jamal Collin, MD. The CCM team was consulted to assist the patient with chronic disease management needs related to: managing symptoms of depression and .   Engaged with patient by telephone for follow up visit in response to provider referral for social work chronic care management and care coordination services.   Consent to Services:  The patient was given information about Chronic Care Management services, agreed to services, and gave verbal consent prior to initiation of services.  Please see initial visit note for detailed documentation.   Patient agreed to services and consent obtained.   Assessment: Patient is engaged in conversation, continues to maintain positive progress with care plan goals.with managing symptoms of depression and stress. See Care Plan below for interventions and patient self-care actives. Recent life changes: located new housing and will be moving in June  Follow up Plan: Patient would like continued follow-up.  CCM LCSW will follow up with patient in 30 days.. Patient will call office if needed prior to next encounter.    Review of patient past medical history, allergies, medications, and health status, including review of relevant consultants reports was performed today as part of a comprehensive evaluation and provision of chronic care management and care coordination services.     SDOH (Social Determinants of Health) assessments and interventions performed:    Advanced Directives Status: Not addressed in this encounter.  CCM Care Plan  Allergies  Allergen Reactions  . Morphine And Related Other (See Comments)    Makes "loopy" and chest "feel funny".   Tresa  Hcl] Nausea And Vomiting    Outpatient Encounter Medications as of 08/09/2020  Medication  Sig  . ALPRAZolam (XANAX) 1 MG tablet Take 1 tablet (1 mg total) by mouth 3 (three) times daily.  Marland Kitchen amLODipine (NORVASC) 5 MG tablet Take 5 mg by mouth daily.  Marland Kitchen aspirin EC 81 MG tablet Take 81 mg by mouth daily.  . cholecalciferol (VITAMIN D3) 25 MCG (1000 UNIT) tablet Take 3,000 Units by mouth daily.  . cyclobenzaprine (FLEXERIL) 10 MG tablet Take 10 mg by mouth 3 (three) times daily.  . diclofenac Sodium (VOLTAREN) 1 % GEL Apply 2 g topically 4 (four) times daily.  Marland Kitchen ibuprofen (ADVIL) 800 MG tablet Take 800 mg by mouth 3 (three) times daily.  Marland Kitchen lisinopril (PRINIVIL,ZESTRIL) 40 MG tablet Take 1 tablet (40 mg total) by mouth daily.  . Omega-3 Fatty Acids (FISH OIL) 1000 MG CAPS Take 1,000 mg by mouth daily.   . pantoprazole (PROTONIX) 40 MG tablet Take 1 tablet (40 mg total) by mouth daily.  . potassium chloride SA (KLOR-CON) 20 MEQ tablet Take 1 tablet (20 mEq total) by mouth daily.  . rosuvastatin (CRESTOR) 20 MG tablet Take 1 tablet (20 mg total) by mouth daily.  . traZODone (DESYREL) 100 MG tablet Take 100 mg by mouth at bedtime.   . vitamin B-12 (CYANOCOBALAMIN) 100 MCG tablet Take 100 mcg by mouth daily.   No facility-administered encounter medications on file as of 08/09/2020.    Patient Active Problem List   Diagnosis Date Noted  . Meningioma ([place]) 06/07/2020  . Fatigue 06/07/2020  . Breast cancer screening 06/07/2020  . Shoulder pain   . Emesis 11/13/2018  . Generalized abdominal pain   . Intractable vomiting   .  Diarrhea with dehydration 11/11/2018  . Leukocytosis   . Elevated serum creatinine 03/06/2017  . Skin lesion of right arm 01/02/2017  . Fingernail abnormalities 01/02/2017  . Rash 12/31/2016  . Changes in vision 09/20/2016  . Amplified musculoskeletal pain, diffuse 09/20/2016  . Trigger point of extremity 09/20/2016  . Chronic diarrhea 09/20/2016  . Chronic bilateral lower abdominal pain 09/20/2016  . 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 Hypokalemia  due to excessive gastrointestinal loss of potassium 08/30/2016  . Bone lesion 08/30/2016  . Non-obstructive CAD   . Obesity, Class II, BMI 35-39.9   . Chronic low back pain 10/12/2013  . Well woman exam 09/02/2013  . Prediabetes 06/02/2013  . Trigger point of shoulder region 02/20/2013  . Insomnia 08/18/2012  . Arthritis of both knees 04/11/2011  . Lower extremity edema 09/24/2010  . Depression 08/10/2010  . HLD (hyperlipidemia) 08/04/2009  . Leiomyoma of uterus 04/29/2008  . Dysfunctional uterine bleeding 04/26/2008  . Anxiety with depression 09/15/2006  . RHINITIS, ALLERGIC NOS 09/08/2006  . GERD 09/08/2006  . HYPERTENSION, BENIGN ESSENTIAL 08/04/2006    Conditions to be addressed/monitored: Depression;   Care Plan : Clinical Social Work  Updates made by Maurine Cane, LCSW since 08/09/2020 12:00 AM  Problem: Coping Skills/ managing symptoms of anxiety & Depression   Priority: High  Long-Range Goal: Coping Skills Enhanced /patient will continue to work with SW to reduce or manage symptoms of anxiety, depression, and stress.   Start Date: 02/04/2020  Recent Progress: On track  Priority: High  Current barriers:   . Chronic Mental Health needs related to symptoms of depression  Needs Support, Education, and Care Coordination in order to meet unmet mental health needs. Clinical Goal(s): patient will work with SW to reduce or manage symptoms of anxiety, depression, and stress and increase knowledge and/or ability of: coping skills, self-management skills, and stress reduction.  Clinical Interventions:  . Assessed patient's needs, coping skills,support system, progress and barriers to care  . Provided interventions: Solution-Focused Strategies, Active listening / Reflection utilized , Emotional Supportive Provided, Behavioral Activation, Problem Solving /Task Center , Increase in actives / exercise encouraged (spending time with friends ), and Verbalization of feelings encouraged   ; . Inter-disciplinary care team collaboration (see longitudinal plan of care) Patient Goals/Self-Care Activities: Over the next 30 days . Continue to avoid negative self-talk, practice positive thinking and self-talk . spend time or talk with others every day . talk about feelings with a friend, family or spiritual advisor   Problem: Barriers to Care   Long-Range Goal: Maintain housing   Start Date: 02/16/2020  Recent Progress: Not on track  Priority: High  Current barriers:   . Patient has located housing . Patient is unable to independently navigate community resource options without care coordination support Clinical Goals: Patient will work with agencies discussed to address related needs. Clinical Interventions:  . Inter-disciplinary care team collaboration (see longitudinal plan of care) . Assessment of needs, barriers , agencies contacted, as well as how impacting  . Patient has located housing and will be moving in June . Other interventions provided:Solution-Focused Strategies, Active listening / Reflection utilized , and Problem [place]  Patient Goals/Self-Care Activities: Over the next 30 days . Follow up with [employer] for community food options  . Congratulations on locating new housing       [name], [place] / [place]   [phone] 11:53 AM

## 2020-08-09 NOTE — Patient Instructions (Addendum)
Visit Information  Goals Addressed            This Visit's Progress   . Find Help in my community   On track    Timeframe:  Long-Range Goal Priority:  High Start Date:     02/04/20                        Expected End Date:   on going                     Patient Goals/Self-Care Activities:  . Congratulations on locating new housing  . Follow up with [employer] for community food options       . Manage My Emotions   On track     Timeframe:  Long-Range Goal Priority:  High Start Date:   02/04/2020                         Expected End Date:  on going                       Patient Goals/Self-Care Activities:  . Continue to avoid negative self-talk, practice positive thinking and self-talk . spend time or talk with others every day . talk about feelings with a friend, family or spiritual advisor      Patient verbalizes understanding of instructions provided today and agrees to view in [place].   Telephone follow up appointment with care management team member scheduled for: 09/13/2020  Shannon Stuart, [place] [facility]  [phone]

## 2020-08-15 ENCOUNTER — Telehealth: Payer: Self-pay

## 2020-08-15 ENCOUNTER — Emergency Department (HOSPITAL_COMMUNITY): Payer: Medicare HMO

## 2020-08-15 ENCOUNTER — Emergency Department (HOSPITAL_COMMUNITY)
Admission: EM | Admit: 2020-08-15 | Discharge: 2020-08-16 | Disposition: A | Discharge status: Home / Self Care | Payer: Medicare HMO | Attending: Emergency Medicine | Admitting: Emergency Medicine

## 2020-08-15 ENCOUNTER — Other Ambulatory Visit: Payer: Self-pay

## 2020-08-15 DIAGNOSIS — Z87891 Personal history of nicotine dependence: Secondary | ICD-10-CM | POA: Diagnosis not present

## 2020-08-15 DIAGNOSIS — R000 Tachycardia, unspecified: Secondary | ICD-10-CM | POA: Diagnosis not present

## 2020-08-15 DIAGNOSIS — K219 Gastro-esophageal reflux disease without esophagitis: Secondary | ICD-10-CM | POA: Insufficient documentation

## 2020-08-15 DIAGNOSIS — R112 Nausea with vomiting, unspecified: Secondary | ICD-10-CM | POA: Diagnosis not present

## 2020-08-15 DIAGNOSIS — E860 Dehydration: Secondary | ICD-10-CM

## 2020-08-15 DIAGNOSIS — R0602 Shortness of breath: Secondary | ICD-10-CM | POA: Insufficient documentation

## 2020-08-15 DIAGNOSIS — I10 Essential (primary) hypertension: Secondary | ICD-10-CM | POA: Insufficient documentation

## 2020-08-15 DIAGNOSIS — I2510 Atherosclerotic heart disease of native coronary artery without angina pectoris: Secondary | ICD-10-CM | POA: Diagnosis not present

## 2020-08-15 DIAGNOSIS — K760 Fatty (change of) liver, not elsewhere classified: Secondary | ICD-10-CM | POA: Diagnosis not present

## 2020-08-15 DIAGNOSIS — R109 Unspecified abdominal pain: Secondary | ICD-10-CM | POA: Insufficient documentation

## 2020-08-15 DIAGNOSIS — Z79899 Other long term (current) drug therapy: Secondary | ICD-10-CM | POA: Diagnosis not present

## 2020-08-15 DIAGNOSIS — Z7982 Long term (current) use of aspirin: Secondary | ICD-10-CM | POA: Diagnosis not present

## 2020-08-15 DIAGNOSIS — E876 Hypokalemia: Secondary | ICD-10-CM

## 2020-08-15 DIAGNOSIS — R1110 Vomiting, unspecified: Secondary | ICD-10-CM | POA: Diagnosis not present

## 2020-08-15 DIAGNOSIS — R079 Chest pain, unspecified: Secondary | ICD-10-CM | POA: Diagnosis not present

## 2020-08-15 DIAGNOSIS — Z20822 Contact with and (suspected) exposure to covid-19: Secondary | ICD-10-CM | POA: Insufficient documentation

## 2020-08-15 DIAGNOSIS — R42 Dizziness and giddiness: Secondary | ICD-10-CM | POA: Diagnosis not present

## 2020-08-15 DIAGNOSIS — K529 Noninfective gastroenteritis and colitis, unspecified: Secondary | ICD-10-CM | POA: Diagnosis not present

## 2020-08-15 LAB — CBC
HCT: 45.1 % (ref 36.0–46.0)
Hemoglobin: 14.7 g/dL (ref 12.0–15.0)
MCH: 29 pg (ref 26.0–34.0)
MCHC: 32.6 g/dL (ref 30.0–36.0)
MCV: 89 fL (ref 80.0–100.0)
Platelets: 245 10*3/uL (ref 150–400)
RBC: 5.07 MIL/uL (ref 3.87–5.11)
RDW: 15.5 % (ref 11.5–15.5)
WBC: 16.6 10*3/uL — ABNORMAL HIGH (ref 4.0–10.5)
nRBC: 0 % (ref 0.0–0.2)

## 2020-08-15 LAB — LIPASE, BLOOD: Lipase: 25 U/L (ref 11–51)

## 2020-08-15 LAB — COMPREHENSIVE METABOLIC PANEL
ALT: 55 U/L — ABNORMAL HIGH (ref 0–44)
AST: 77 U/L — ABNORMAL HIGH (ref 15–41)
Albumin: 4.9 g/dL (ref 3.5–5.0)
Alkaline Phosphatase: 78 U/L (ref 38–126)
Anion gap: 15 (ref 5–15)
BUN: 22 mg/dL (ref 8–23)
CO2: 26 mmol/L (ref 22–32)
Calcium: 9.2 mg/dL (ref 8.9–10.3)
Chloride: 95 mmol/L — ABNORMAL LOW (ref 98–111)
Creatinine, Ser: 1.56 mg/dL — ABNORMAL HIGH (ref 0.44–1.00)
GFR, Estimated: 37 mL/min — ABNORMAL LOW (ref >60)
Glucose, Bld: 149 mg/dL — ABNORMAL HIGH (ref 70–99)
Potassium: 3 mmol/L — ABNORMAL LOW (ref 3.5–5.1)
Sodium: 136 mmol/L (ref 135–145)
Total Bilirubin: 1.3 mg/dL — ABNORMAL HIGH (ref 0.3–1.2)
Total Protein: 8.4 g/dL — ABNORMAL HIGH (ref 6.5–8.1)

## 2020-08-15 LAB — SARS CORONAVIRUS 2 (TAT 6-24 HRS): SARS Coronavirus 2: NEGATIVE

## 2020-08-15 LAB — TROPONIN I (HIGH SENSITIVITY)
Troponin I (High Sensitivity): 52 ng/L — ABNORMAL HIGH (ref <18)
Troponin I (High Sensitivity): 40 ng/L — ABNORMAL HIGH (ref <18)

## 2020-08-15 LAB — GROUP A STREP BY PCR: Group A Strep by PCR: NOT DETECTED

## 2020-08-15 MED ORDER — FAMOTIDINE IN NACL 20-0.9 MG/50ML-% IV SOLN
20.0000 mg | Freq: Once | INTRAVENOUS | Status: AC
  Administered 2020-08-15: 20 mg via INTRAVENOUS
  Filled 2020-08-15: qty 50

## 2020-08-15 MED ORDER — SODIUM CHLORIDE 0.9 % IV SOLN
25.0000 mg | Freq: Four times a day (QID) | INTRAVENOUS | Status: DC | PRN
  Administered 2020-08-15: 25 mg via INTRAVENOUS
  Filled 2020-08-15: qty 1

## 2020-08-15 MED ORDER — IOHEXOL 350 MG/ML SOLN
75.0000 mL | Freq: Once | INTRAVENOUS | Status: AC | PRN
  Administered 2020-08-15: 75 mL via INTRAVENOUS

## 2020-08-15 MED ORDER — SODIUM CHLORIDE 0.9 % IV BOLUS
1000.0000 mL | Freq: Once | INTRAVENOUS | Status: AC
  Administered 2020-08-15: 1000 mL via INTRAVENOUS

## 2020-08-15 MED ORDER — PROMETHAZINE HCL 25 MG RE SUPP: 25.0000 mg | Freq: Four times a day (QID) | RECTAL | 0 refills | Status: DC | PRN

## 2020-08-15 MED ORDER — POTASSIUM CHLORIDE 10 MEQ/100ML IV SOLN
10.0000 meq | Freq: Once | INTRAVENOUS | Status: AC
  Administered 2020-08-15: 10 meq via INTRAVENOUS
  Filled 2020-08-15: qty 100

## 2020-08-15 MED ORDER — FENTANYL CITRATE (PF) 100 MCG/2ML IJ SOLN: 50.0000 ug | Freq: Once | INTRAMUSCULAR | Status: DC

## 2020-08-15 MED ORDER — HYDROMORPHONE HCL 1 MG/ML IJ SOLN
1.0000 mg | Freq: Once | INTRAMUSCULAR | Status: AC
Start: 2020-08-15 — End: 2020-08-15
  Administered 2020-08-15: 1 mg via INTRAVENOUS
  Filled 2020-08-15: qty 1

## 2020-08-15 NOTE — ED Triage Notes (Signed)
Pt reports nausea, vomiting since Sunday. Pt reports abdominal pain and sore throat. Pt reports feels dizzy.

## 2020-08-15 NOTE — Telephone Encounter (Signed)
Patient calls nurse line regarding vomiting since Sunday. Patient reports that she has body aches, abdominal pain, and is cold and clammy. Denies known fever. Reports that she is unable to hold down fluids.   Due to length of symptoms and inability to hold down fluids, recommended patient be evaluated in UC or ED. Patient verbalizes understanding.   Talbot Grumbling, RN

## 2020-08-15 NOTE — ED Provider Notes (Signed)
Emergency Medicine Provider Triage Evaluation Note  Shannon Stuart , a 60 y.o. female  was evaluated in triage.  Pt complains of CP, cough, vomiting.  Review of Systems  Positive: CP, cough, vomiting Negative: Shortness of breath  Physical Exam  BP (!) 155/115 (BP Location: Right Arm)   Pulse (!) 105   Temp 99.1 F (37.3 C) (Oral)   Resp 16   SpO2 93%  Gen:   Awake, no distress   Resp:  Normal effort  MSK:   Moves extremities without difficulty  Other:    Medical Decision Making  Medically screening exam initiated at 2:28 PM.  Appropriate orders placed.  [name] was informed that the remainder of the evaluation will be completed by another provider, this initial triage assessment does not replace that evaluation, and the importance of remaining in the ED until their evaluation is complete.     Shannon Bender, PA-C 08/15/20 1429    Lajean Saver, MD 08/17/20 1253
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
## 2020-08-15 NOTE — Telephone Encounter (Signed)
Called patient.  Has not taken any meds (I would have held her lisinopril) since Sunday.  Zofran makes me worse.  I called in phenergan suppositories #4.  She is aware about needing to go to ER if vomiting does not resolve.

## 2020-08-16 MED ORDER — POTASSIUM CHLORIDE CRYS ER 20 MEQ PO TBCR: 20.0000 meq | EXTENDED_RELEASE_TABLET | Freq: Every day | ORAL | 0 refills | Status: DC

## 2020-08-16 NOTE — Discharge Instructions (Addendum)
You are dehydrated from enteritis which is a stomach virus.   Take Phenergan as prescribed by your doctor.  Please take potassium pills daily for 5 days.  You need to have repeat potassium level checked with your doctor in a week.  Please also see your doctor this week  Return to ER if you have severe abdominal pain, vomiting, chest pain, dehydration

## 2020-08-31 DIAGNOSIS — H5213 Myopia, bilateral: Secondary | ICD-10-CM | POA: Diagnosis not present

## 2020-09-07 ENCOUNTER — Other Ambulatory Visit: Payer: Self-pay | Admitting: Family Medicine

## 2020-09-07 DIAGNOSIS — M170 Bilateral primary osteoarthritis of knee: Secondary | ICD-10-CM

## 2020-09-12 ENCOUNTER — Other Ambulatory Visit: Payer: Self-pay | Admitting: Family Medicine

## 2020-09-12 DIAGNOSIS — Z1231 Encounter for screening mammogram for malignant neoplasm of breast: Secondary | ICD-10-CM

## 2020-09-13 ENCOUNTER — Ambulatory Visit: Payer: Medicare HMO | Admitting: Licensed Clinical Social Worker

## 2020-09-13 DIAGNOSIS — Z719 Counseling, unspecified: Secondary | ICD-10-CM

## 2020-09-13 NOTE — Patient Instructions (Signed)
Visit Information   Goals Addressed             This Visit's Progress    Find Help in my community   On track    Timeframe:  Long-Range Goal Priority:  High Start Date:     02/04/20                        Expected End Date:   on going                     Patient Goals/Self-Care Activities:  Congratulations on locating new housing  Follow up with [employer] for community food options        Manage My Emotions   On track     Timeframe:  Long-Range Goal Priority:  High Start Date:   02/04/2020                         Expected End Date:  on going                       Patient Goals/Self-Care Activities:  You continue to make progress with managing your emotion s Continue to avoid negative self-talk, practice positive thinking and self-talk spend time or talk with your friend talk about feelings with your friend or spiritual advisor      Patient verbalizes understanding of instructions provided today and agrees to view in [place].   Telephone follow up appointment with care management team member scheduled for:10/11/2020 [name], [place] [facility]  [phone]

## 2020-09-13 NOTE — Chronic Care Management (AMB) (Signed)
Care Management   Clinical Social Work Note  09/13/2020 Name: Shannon Stuart MRN: [id number] DOB: 1960-09-02  Shannon Stuart is a 60 y.o. year old female who is a primary care patient of Hensel, Shannon Collin, MD. The CCM team was consulted to assist the patient with chronic disease management and/or care coordination needs related to: Mental Health Counseling and Resources.   Engaged with patient by telephone for follow up visit in response to provider referral for social work chronic care management and care coordination services.   Consent to Services: Patient agreed to services and consent obtained.    Assessment: Patient is engaged in conversation, continues to maintain positive progress with care plan goals.She is doing much better with recognizing triggers and redirecting the negative energy. See Care Plan below for interventions and patient self-care actives. Recent life changes Shannon Stuart: Unable to move into new apartment until July; family stressors  Follow up Plan: Patient would like continued follow-up.  CCM LCSW will follow up with patient 30 days. Patient will call office if needed prior to next encounter.   Review of patient past medical history, allergies, medications, and health status, including review of relevant consultants reports was performed today as part of a comprehensive evaluation and provision of chronic care management and care coordination services.     SDOH (Social Determinants of Health) assessments and interventions performed:    Advanced Directives Status: Not addressed in this encounter.  CCM Care Plan  Allergies  Allergen Reactions   Morphine And Related Other (See Comments)    Makes "loopy" and chest "feel funny".    Zofran [Ondansetron Hcl] Nausea And Vomiting    Outpatient Encounter Medications as of 09/13/2020  Medication Sig   ALPRAZolam (XANAX) 1 MG tablet Take 1 tablet (1 mg total) by mouth 3 (three) times daily.   amLODipine (NORVASC) 5 MG  tablet Take 5 mg by mouth daily.   aspirin EC 81 MG tablet Take 81 mg by mouth daily.   cholecalciferol (VITAMIN D3) 25 MCG (1000 UNIT) tablet Take 3,000 Units by mouth daily.   cyclobenzaprine (FLEXERIL) 10 MG tablet Take 10 mg by mouth 3 (three) times daily.   diclofenac Sodium (VOLTAREN) 1 % GEL APPLY 2 GRAMS TOPICALLY FOUR TIMES DAILY   ibuprofen (ADVIL) 800 MG tablet Take 800 mg by mouth 3 (three) times daily.   lisinopril (PRINIVIL,ZESTRIL) 40 MG tablet Take 1 tablet (40 mg total) by mouth daily.   Omega-3 Fatty Acids (FISH OIL) 1000 MG CAPS Take 1,000 mg by mouth daily.    pantoprazole (PROTONIX) 40 MG tablet Take 1 tablet (40 mg total) by mouth daily.   potassium chloride SA (KLOR-CON) 20 MEQ tablet Take 1 tablet (20 mEq total) by mouth daily.   promethazine (PHENERGAN) 25 MG suppository Place 1 suppository (25 mg total) rectally every 6 (six) hours as needed for nausea or vomiting.   rosuvastatin (CRESTOR) 20 MG tablet Take 1 tablet (20 mg total) by mouth daily.   traZODone (DESYREL) 100 MG tablet Take 100 mg by mouth at bedtime.    vitamin B-12 (CYANOCOBALAMIN) 100 MCG tablet Take 100 mcg by mouth daily.   No facility-administered encounter medications on file as of 09/13/2020.    Patient Active Problem List   Diagnosis Date Noted   Meningioma ([place]) 06/07/2020   Fatigue 06/07/2020   Breast cancer screening 06/07/2020   Shoulder pain    Emesis 11/13/2018   Generalized abdominal pain    Intractable vomiting    Diarrhea  with dehydration 11/11/2018   Leukocytosis    Elevated serum creatinine 03/06/2017   Skin lesion of right arm 01/02/2017   Fingernail abnormalities 01/02/2017   Rash 12/31/2016   Changes in vision 09/20/2016   Amplified musculoskeletal pain, diffuse 09/20/2016   Trigger point of extremity 09/20/2016   Chronic diarrhea 09/20/2016   Chronic bilateral lower abdominal pain 09/20/2016   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   Hypokalemia due to excessive  gastrointestinal loss of potassium 08/30/2016   Bone lesion 08/30/2016   Non-obstructive CAD    Obesity, Class II, BMI 35-39.9    Chronic low back pain 10/12/2013   Well woman exam 09/02/2013   Prediabetes 06/02/2013   Trigger point of shoulder region 02/20/2013   Insomnia 08/18/2012   Arthritis of both knees 04/11/2011   Lower extremity edema 09/24/2010   Depression 08/10/2010   HLD (hyperlipidemia) 08/04/2009   Leiomyoma of uterus 04/29/2008   Dysfunctional uterine bleeding 04/26/2008   Anxiety with depression 09/15/2006   RHINITIS, ALLERGIC NOS 09/08/2006   GERD 09/08/2006   HYPERTENSION, BENIGN ESSENTIAL 08/04/2006    Conditions to be addressed/monitored: Anxiety and Depression; Stress  Care Plan : Clinical Social Work  Updates made by Maurine Cane, LCSW since 09/13/2020 12:00 AM   Problem: Coping Skills/ managing symptoms of anxiety & Depression   Priority: High   Long-Range Goal: Coping Skills Enhanced   Start Date: 02/04/2020  This Visit's Progress: On track  Recent Progress: On track  Priority: High  Current barriers:   Chronic Mental Health needs related to symptoms of depression  and anxiety Needs Support, Education, and Care Coordination in order to meet unmet mental health needs. Clinical Goal(s): patient will work with SW to reduce or manage symptoms of anxiety, depression, and stress and increase knowledge and/or ability of: coping skills, self-management skills, and stress reduction.  Clinical Interventions:  Assessed patient's needs, coping skills,support system, progress and barriers to care  Provided interventions: Solution-Focused Strategies, Active listening / Reflection utilized , Emotional Supportive Provided, Behavioral Activation, Problem [place] , Increase in actives / exercise encouraged (spending time with friends ), and Verbalization of feelings encouraged  ; Inter-disciplinary care team collaboration (see longitudinal plan of  care) Patient Goals/Self-Care Activities: Over the next 30 days Continue to avoid negative self-talk, practice positive thinking and self-talk spend time or talk with your friend talk about feelings with friend or spiritual advisor    Problem: Barriers to Care    Long-Range Goal: Maintain housing   Start Date: 02/16/2020  This Visit's Progress: On track  Recent Progress: Not on track  Priority: High  Current barriers:   Patient has located housing still pending being able to move into the home Clinical Goals: Patient will work with agencies discussed to address related needs. Clinical Interventions:  Inter-disciplinary care team collaboration (see longitudinal plan of care) Assessment of needs, barriers , agencies contacted, as well as how impacting  Patient has located housing and will be moving in the end of June or first of July Currently living with friends Other interventions provided:Solution-Focused Strategies, Active listening / Reflection utilized , and [place]  Patient Goals/Self-Care Activities: Over the next 30 days Follow up with [employer] for [employer] options  Congratulations on locating new housing      [name], [place] / [place]   [phone] 11:27 AM

## 2020-09-14 ENCOUNTER — Ambulatory Visit: Payer: Medicare HMO

## 2020-09-19 DIAGNOSIS — M5416 Radiculopathy, lumbar region: Secondary | ICD-10-CM | POA: Diagnosis not present

## 2020-10-10 DIAGNOSIS — Z79899 Other long term (current) drug therapy: Secondary | ICD-10-CM | POA: Diagnosis not present

## 2020-10-10 DIAGNOSIS — M5416 Radiculopathy, lumbar region: Secondary | ICD-10-CM | POA: Diagnosis not present

## 2020-10-10 DIAGNOSIS — M1612 Unilateral primary osteoarthritis, left hip: Secondary | ICD-10-CM | POA: Diagnosis not present

## 2020-10-10 DIAGNOSIS — F1120 Opioid dependence, uncomplicated: Secondary | ICD-10-CM | POA: Diagnosis not present

## 2020-10-10 DIAGNOSIS — M5136 Other intervertebral disc degeneration, lumbar region: Secondary | ICD-10-CM | POA: Diagnosis not present

## 2020-10-11 ENCOUNTER — Ambulatory Visit: Payer: Medicare HMO | Admitting: Licensed Clinical Social Worker

## 2020-10-11 DIAGNOSIS — Z719 Counseling, unspecified: Secondary | ICD-10-CM

## 2020-10-11 NOTE — Patient Instructions (Signed)
Visit Information   Goals Addressed             This Visit's Progress    Find Help in my community   On track    Timeframe:  Long-Range Goal Priority:  High Start Date:     02/04/20                        Expected End Date:   on going                     Patient Goals/Self-Care Activities:  Congratulations on locating new housing  Follow up with [employer] on [address] and [place] on [address] for community food options      Manage My Emotions   On track     Timeframe:  Long-Range Goal Priority:  High Start Date:   02/04/2020                         Expected End Date:  on going                       Patient Goals/Self-Care Activities:  You are doing a great job with managing your emotion  Continue to avoid negative self-talk, practice positive thinking and self-talk Continue to spend time your friend or spiritual advisor Reach out to VR to set up appointment       Patient verbalizes understanding of instructions provided today and agrees to view in [place].   Telephone follow up appointment with care management team member scheduled for: 11/08/2020  Shannon Stuart, [place] [facility]  [phone]

## 2020-10-11 NOTE — Chronic Care Management (AMB) (Signed)
Care Management   Clinical Social Work Note  10/11/2020 Name: DELANO SCARDINO MRN: [id number] DOB: 1960-11-13  TANAIYA KOLARIK is a 60 y.o. year old female who is a primary care patient of Hensel, Jamal Collin, MD. The CCM team was consulted to assist the patient with chronic disease management and/or care coordination needs related to:  managing symptoms of anxiety and stress .   Engaged with patient by telephone for follow up visit in response to provider referral for social work chronic care management and care coordination services.   Consent to Services:  The patient was given information about Chronic Care Management services, agreed to services, and gave verbal consent prior to initiation of services.  Please see initial visit note for detailed documentation.   Patient agreed to services and consent obtained.   Assessment: Patient is engaged in conversation, continues to maintain positive progress with care plan goals.. See Care Plan below for interventions and patient self-care actives.  Recommendation: Patient may benefit from, and is in agreement to visit [place] for food options and revisit [facility] for personal development options.    Follow up Plan: Patient would like continued follow-up from CCM LCSW .  Follow up scheduled in 4 weeks. Patient will call office if needed prior to next encounter.    Review of patient past medical history, allergies, medications, and health status, including review of relevant consultants reports was performed today as part of a comprehensive evaluation and provision of chronic care management and care coordination services.     SDOH (Social Determinants of Health) assessments and interventions performed:    Advanced Directives Status: Not addressed in this encounter.  CCM Care Plan  Allergies  Allergen Reactions   Morphine And Related Other (See Comments)    Makes "loopy" and chest "feel funny".    Zofran  [Ondansetron Hcl] Nausea And Vomiting    Outpatient Encounter Medications as of 10/11/2020  Medication Sig   ALPRAZolam (XANAX) 1 MG tablet Take 1 tablet (1 mg total) by mouth 3 (three) times daily.   amLODipine (NORVASC) 5 MG tablet Take 5 mg by mouth daily.   aspirin EC 81 MG tablet Take 81 mg by mouth daily.   cholecalciferol (VITAMIN D3) 25 MCG (1000 UNIT) tablet Take 3,000 Units by mouth daily.   cyclobenzaprine (FLEXERIL) 10 MG tablet Take 10 mg by mouth 3 (three) times daily.   diclofenac Sodium (VOLTAREN) 1 % GEL APPLY 2 GRAMS TOPICALLY FOUR TIMES DAILY   ibuprofen (ADVIL) 800 MG tablet Take 800 mg by mouth 3 (three) times daily.   lisinopril (PRINIVIL,ZESTRIL) 40 MG tablet Take 1 tablet (40 mg total) by mouth daily.   Omega-3 Fatty Acids (FISH OIL) 1000 MG CAPS Take 1,000 mg by mouth daily.    pantoprazole (PROTONIX) 40 MG tablet Take 1 tablet (40 mg total) by mouth daily.   potassium chloride SA (KLOR-CON) 20 MEQ tablet Take 1 tablet (20 mEq total) by mouth daily.   promethazine (PHENERGAN) 25 MG suppository Place 1 suppository (25 mg total) rectally every 6 (six) hours as needed for nausea or vomiting.   rosuvastatin (CRESTOR) 20 MG tablet Take 1 tablet (20 mg total) by mouth daily.   traZODone (DESYREL) 100 MG tablet Take 100 mg by mouth at bedtime.    vitamin B-12 (CYANOCOBALAMIN) 100 MCG tablet Take 100 mcg by mouth daily.   No facility-administered encounter medications on file as of 10/11/2020.    Patient Active Problem List   Diagnosis  Date Noted   Meningioma ([place]) 06/07/2020   Fatigue 06/07/2020   Breast cancer screening 06/07/2020   Shoulder pain    Emesis 11/13/2018   Generalized abdominal pain    Intractable vomiting    Diarrhea with dehydration 11/11/2018   Leukocytosis    Elevated serum creatinine 03/06/2017   Skin lesion of right arm 01/02/2017   Fingernail abnormalities 01/02/2017   Rash 12/31/2016   Changes in vision 09/20/2016   Amplified  musculoskeletal pain, diffuse 09/20/2016   Trigger point of extremity 09/20/2016   Chronic diarrhea 09/20/2016   Chronic bilateral lower abdominal pain 09/20/2016   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[place]) 09/17/2016   Hypokalemia due to excessive gastrointestinal loss of potassium 08/30/2016   Bone lesion 08/30/2016   Non-obstructive CAD    Obesity, Class II, BMI 35-39.9    Chronic low back pain 10/12/2013   Well woman exam 09/02/2013   Prediabetes 06/02/2013   Trigger point of shoulder region 02/20/2013   Insomnia 08/18/2012   Arthritis of both knees 04/11/2011   Lower extremity edema 09/24/2010   Depression 08/10/2010   HLD (hyperlipidemia) 08/04/2009   Leiomyoma of uterus 04/29/2008   Dysfunctional uterine bleeding 04/26/2008   Anxiety with depression 09/15/2006   RHINITIS, ALLERGIC NOS 09/08/2006   GERD 09/08/2006   HYPERTENSION, BENIGN ESSENTIAL 08/04/2006    Conditions to be addressed/monitored: Anxiety and Depression;  Care Plan : Clinical Social Work  Updates made by Maurine Cane, LCSW since 10/11/2020 12:00 AM   Problem: Coping Skills/ managing symptoms of anxiety & Depression   Priority: High   Long-Range Goal: Continue to enhance coping skills   Start Date: 02/04/2020  This Visit's Progress: On track  Recent Progress: On track  Priority: High  Current barriers:   Chronic Mental Health needs related to symptoms of depression  and anxiety Needs Support, Education, and Care Coordination in order to meet unmet mental health needs. Clinical Goal(s): patient will work with SW to reduce and manage symptoms of anxiety, depression, and stress and increase coping skills, self-management skills, and stress reduction.  Clinical Interventions:  Assessed patient's needs, coping skills,support system, progress and barriers to care  Provided food resources for [place]  Provided interventions: Solution-Focused Strategies, Active listening / Reflection  utilized , Emotional Supportive Provided, Behavioral Activation, Problem [place] , Increase in actives / exercise encouraged (spending time with friends ), and Verbalization of feelings encouraged  ; Inter-disciplinary care team collaboration (see longitudinal plan of care) Patient Goals/Self-Care Activities: Over the next 30 days Continue to avoid negative self-talk, practice positive thinking and self-talk Continue to spend time with your friend and talk to spiritual advisor Reach out to VR to set up appointment    Problem: Barriers to Care    Long-Range Goal: Maintain housing Completed 10/11/2020  Start Date: 02/16/2020  Recent Progress: On track  Priority: High  Current barriers:  goal completed Patient has located housing still pending being able to move into the home Clinical Goals: Patient will maintain current housing. Clinical Interventions:  Inter-disciplinary care team collaboration (see longitudinal plan of care) Assessment of needs, barriers , agencies contacted, as well as how impacting  Patient has located housing and moved into her new home.  Other interventions provided:Solution-Focused Strategies, Active listening / Reflection utilized , and Problem [place]  Patient Goals/Self-Care Activities: Over the next 30 days Follow up with [employer] and [place] for [employer] options  Congratulations  on locating new housing       [name], [place] / [place]   [phone] 11:20 AM

## 2020-11-08 ENCOUNTER — Ambulatory Visit: Payer: Medicare HMO | Admitting: Licensed Clinical Social Worker

## 2020-11-08 DIAGNOSIS — Z719 Counseling, unspecified: Secondary | ICD-10-CM

## 2020-11-08 NOTE — Patient Instructions (Signed)
Visit Information   Goals Addressed             This Visit's Progress    Manage My Emotions   On track     Timeframe:  Long-Range Goal Priority:  High Start Date:   02/04/2020                         Expected End Date:  on going                       Patient Goals/Self-Care Activities:  You are doing a great job with managing your emotion  Continue to avoid negative self-talk, practice positive thinking and self-talk Continue to spend time with your friend and talk to spiritual advisor Congratulations on reaching out to VR and starting yoga next week Continue with daily task list        Patient verbalizes understanding of instructions provided today and agrees to view in [place].   Telephone follow up appointment with care management team member scheduled for:12/13/2020  Averly Ericson, [place] Management [facility] Coordination  [phone]

## 2020-11-08 NOTE — Chronic Care Management (AMB) (Signed)
Care Management Clinical Social Work Note  11/08/2020 Name: DONIELLE FALEN MRN: [id number] DOB: 06-Nov-1960  [name] is a 60 y.o. year old female who is a primary care patient of Hensel, Jamal Collin, MD.  The Care Management team was consulted for assistance with coordination needs.  Consent to Services:  The patient was given information about Care Management services, agreed to services, and gave verbal consent prior to initiation of services.  Please see initial visit note for detailed documentation.   Patient agreed to services today and consent obtained.   Assessment: Engaged with patient by phone in response to provider referral for social work care coordination: Counseling .   Patient continues to maintain positive progress with care plan goals and is ready to move to next steps discussed to enhance her mental health.See Care Plan below for interventions and patient self-care actives.  Recommendation: Patient may benefit from, and is in agreement move forward with personal goals to help with bringing balance to mental and physical health.   Follow up Plan: Patient would like continued follow-up from CCM LCSW .  per patient's request will follow up in 30 days.  Will call office if needed prior to next encounter.     Review of patient past medical history, allergies, medications, and health status, including review of relevant consultants reports was performed today as part of a comprehensive evaluation and provision of chronic care management and care coordination services.  SDOH (Social Determinants of Health) assessments and interventions performed:    Advanced Directives Status: Not addressed in this encounter.  Care Plan  Allergies  Allergen Reactions   Morphine And Related Other (See Comments)    Makes "loopy" and chest "feel funny".    Zofran [Ondansetron Hcl] Nausea And Vomiting    Outpatient Encounter Medications as of 11/08/2020  Medication Sig   ALPRAZolam (XANAX) 1  MG tablet Take 1 tablet (1 mg total) by mouth 3 (three) times daily.   amLODipine (NORVASC) 5 MG tablet Take 5 mg by mouth daily.   aspirin EC 81 MG tablet Take 81 mg by mouth daily.   cholecalciferol (VITAMIN D3) 25 MCG (1000 UNIT) tablet Take 3,000 Units by mouth daily.   cyclobenzaprine (FLEXERIL) 10 MG tablet Take 10 mg by mouth 3 (three) times daily.   diclofenac Sodium (VOLTAREN) 1 % GEL APPLY 2 GRAMS TOPICALLY FOUR TIMES DAILY   ibuprofen (ADVIL) 800 MG tablet Take 800 mg by mouth 3 (three) times daily.   lisinopril (PRINIVIL,ZESTRIL) 40 MG tablet Take 1 tablet (40 mg total) by mouth daily.   Omega-3 Fatty Acids (FISH OIL) 1000 MG CAPS Take 1,000 mg by mouth daily.    pantoprazole (PROTONIX) 40 MG tablet Take 1 tablet (40 mg total) by mouth daily.   potassium chloride SA (KLOR-CON) 20 MEQ tablet Take 1 tablet (20 mEq total) by mouth daily.   promethazine (PHENERGAN) 25 MG suppository Place 1 suppository (25 mg total) rectally every 6 (six) hours as needed for nausea or vomiting.   rosuvastatin (CRESTOR) 20 MG tablet Take 1 tablet (20 mg total) by mouth daily.   traZODone (DESYREL) 100 MG tablet Take 100 mg by mouth at bedtime.    vitamin B-12 (CYANOCOBALAMIN) 100 MCG tablet Take 100 mcg by mouth daily.   No facility-administered encounter medications on file as of 11/08/2020.    Patient Active Problem List   Diagnosis Date Noted   Meningioma ([place]) 06/07/2020   Fatigue 06/07/2020   Breast cancer screening 06/07/2020  Shoulder pain    Emesis 11/13/2018   Generalized abdominal pain    Intractable vomiting    Diarrhea with dehydration 11/11/2018   Leukocytosis    Elevated serum creatinine 03/06/2017   Skin lesion of right arm 01/02/2017   Fingernail abnormalities 01/02/2017   Rash 12/31/2016   Changes in vision 09/20/2016   Amplified musculoskeletal pain, diffuse 09/20/2016   Trigger point of extremity 09/20/2016   Chronic diarrhea 09/20/2016   Chronic bilateral lower  abdominal pain 09/20/2016   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[place]) 09/17/2016   Hypokalemia due to excessive gastrointestinal loss of potassium 08/30/2016   Bone lesion 08/30/2016   Non-obstructive CAD    Obesity, Class II, BMI 35-39.9    Chronic low back pain 10/12/2013   Well woman exam 09/02/2013   Prediabetes 06/02/2013   Trigger point of shoulder region 02/20/2013   Insomnia 08/18/2012   Arthritis of both knees 04/11/2011   Lower extremity edema 09/24/2010   Depression 08/10/2010   HLD (hyperlipidemia) 08/04/2009   Leiomyoma of uterus 04/29/2008   Dysfunctional uterine bleeding 04/26/2008   Anxiety with depression 09/15/2006   RHINITIS, ALLERGIC NOS 09/08/2006   GERD 09/08/2006   HYPERTENSION, BENIGN ESSENTIAL 08/04/2006    Conditions to be addressed/monitored: Anxiety;   Care Plan : Clinical Social Work  Updates made by Maurine Cane, LCSW since 11/08/2020 12:00 AM   Problem: symptoms of anxiety & Depression   Priority: High   Long-Range Goal: Continue to enhance coping skills with managing symptoms of anxiety and depression   Start Date: 02/04/2020  This Visit's Progress: On track  Recent Progress: On track  Priority: High  Note:   Current barriers:   Chronic Mental Health needs related to symptoms of depression  and anxiety Needs Support, Education, and Care Coordination in order to meet unmet mental health needs. Clinical Goal(s): patient will work with SW to reduce and manage symptoms of anxiety, depression, and stress and increase coping skills, self-management skills, and stress reduction.  Clinical Interventions:  Assessed patient's needs, coping skills,support system, progress and barriers to care  Provided interventions: Solution-Focused Strategies, Active listening / Reflection utilized , Emotional Supportive Provided, Behavioral Activation, Motivational Interviewing, Increase in actives / exercise encouraged , and Verbalization of feelings encouraged   ; Discussed goals( daily task; connect with vocational rehabilitation and start exercise and yoga) Inter-disciplinary care team collaboration (see longitudinal plan of care) Patient Goals/Self-Care Activities: Over the next 30 days Continue to avoid negative self-talk, practice positive thinking and self-talk Continue to spend time with your friend and talk to spiritual advisor Congratulations on reaching out to [facility] and starting yoga next week Continue with daily task list      [name], [place] / [place]   [phone] 10:25 AM

## 2020-11-13 ENCOUNTER — Ambulatory Visit: Payer: Medicare HMO

## 2020-11-13 ENCOUNTER — Other Ambulatory Visit: Payer: Self-pay

## 2020-11-13 ENCOUNTER — Ambulatory Visit
Admission: RE | Admit: 2020-11-13 | Discharge: 2020-11-13 | Disposition: A | Discharge status: Home / Self Care | Payer: Medicare HMO | Source: Ambulatory Visit | Attending: Family Medicine | Admitting: Family Medicine

## 2020-11-13 DIAGNOSIS — Z1231 Encounter for screening mammogram for malignant neoplasm of breast: Secondary | ICD-10-CM

## 2020-11-28 ENCOUNTER — Encounter: Payer: Self-pay | Admitting: Gastroenterology

## 2020-11-29 ENCOUNTER — Other Ambulatory Visit: Payer: Self-pay | Admitting: *Deleted

## 2020-11-29 DIAGNOSIS — I10 Essential (primary) hypertension: Secondary | ICD-10-CM

## 2020-11-29 DIAGNOSIS — K219 Gastro-esophageal reflux disease without esophagitis: Secondary | ICD-10-CM

## 2020-11-29 MED ORDER — AMLODIPINE BESYLATE 5 MG PO TABS: 5.0000 mg | ORAL_TABLET | Freq: Every day | ORAL | 3 refills | Status: DC

## 2020-11-29 MED ORDER — PANTOPRAZOLE SODIUM 40 MG PO TBEC: 40.0000 mg | DELAYED_RELEASE_TABLET | Freq: Every day | ORAL | 3 refills | Status: DC

## 2020-11-29 MED ORDER — LISINOPRIL 40 MG PO TABS: 40.0000 mg | ORAL_TABLET | Freq: Every day | ORAL | 3 refills | Status: DC

## 2020-12-06 ENCOUNTER — Other Ambulatory Visit: Payer: Self-pay | Admitting: Family Medicine

## 2020-12-06 DIAGNOSIS — F32A Depression, unspecified: Secondary | ICD-10-CM

## 2020-12-06 DIAGNOSIS — F418 Other specified anxiety disorders: Secondary | ICD-10-CM

## 2020-12-06 NOTE — Telephone Encounter (Signed)
Patient calls nurse line regarding refill request. Will forward to PCP.   Talbot Grumbling, RN

## 2020-12-13 ENCOUNTER — Ambulatory Visit: Payer: Medicare HMO | Admitting: Licensed Clinical Social Worker

## 2020-12-13 NOTE — Chronic Care Management (AMB) (Signed)
    Clinical Social Work  Care Management   Phone Outreach    12/13/2020 Name: Shannon Stuart MRN: [id number] DOB: 1960/11/21  [name] is a 60 y.o. year old female who is a primary care patient of Hensel, Jamal Collin, MD .   Reason for referral:  supportive therapy .    F/U phone call today to assess needs, progress and barriers with care plan goals.   Unable to keep phone appointment today and requested to reschedule.  Plan:Appointment was rescheduled with CCM 01/03/21  Review of patient status, including review of consultants reports, relevant laboratory and other test results, and collaboration with appropriate care team members and the patient's provider was performed as part of comprehensive patient evaluation and provision of care management services.     [name], [place] / [place]   [phone] 9:31 AM

## 2020-12-13 NOTE — Patient Instructions (Signed)
   It was a pleasure speaking with you today. per your request your appointment is scheduled 01/03/2021 Please call the office if needed Patient verbalizes understanding of instructions provided today.   [name], LCSW Care Management & Coordination  [phone]

## 2020-12-21 DIAGNOSIS — M5416 Radiculopathy, lumbar region: Secondary | ICD-10-CM | POA: Diagnosis not present

## 2020-12-27 ENCOUNTER — Other Ambulatory Visit: Payer: Self-pay | Admitting: Family Medicine

## 2020-12-27 DIAGNOSIS — E782 Mixed hyperlipidemia: Secondary | ICD-10-CM

## 2021-01-01 ENCOUNTER — Other Ambulatory Visit: Payer: Self-pay

## 2021-01-01 ENCOUNTER — Encounter: Payer: Self-pay | Admitting: Family Medicine

## 2021-01-01 ENCOUNTER — Ambulatory Visit (INDEPENDENT_AMBULATORY_CARE_PROVIDER_SITE_OTHER): Payer: Medicare HMO | Admitting: Family Medicine

## 2021-01-01 VITALS — BP 113/78 | HR 94 | Ht 68.0 in | Wt 211.4 lb

## 2021-01-01 DIAGNOSIS — R7401 Elevation of levels of liver transaminase levels: Secondary | ICD-10-CM | POA: Insufficient documentation

## 2021-01-01 DIAGNOSIS — G4700 Insomnia, unspecified: Secondary | ICD-10-CM | POA: Diagnosis not present

## 2021-01-01 DIAGNOSIS — G8929 Other chronic pain: Secondary | ICD-10-CM | POA: Diagnosis not present

## 2021-01-01 DIAGNOSIS — Z23 Encounter for immunization: Secondary | ICD-10-CM

## 2021-01-01 DIAGNOSIS — R233 Spontaneous ecchymoses: Secondary | ICD-10-CM | POA: Insufficient documentation

## 2021-01-01 DIAGNOSIS — K625 Hemorrhage of anus and rectum: Secondary | ICD-10-CM | POA: Insufficient documentation

## 2021-01-01 DIAGNOSIS — R042 Hemoptysis: Secondary | ICD-10-CM | POA: Insufficient documentation

## 2021-01-01 DIAGNOSIS — M5440 Lumbago with sciatica, unspecified side: Secondary | ICD-10-CM | POA: Diagnosis not present

## 2021-01-01 MED ORDER — CYCLOBENZAPRINE HCL 10 MG PO TABS: 10.0000 mg | ORAL_TABLET | Freq: Three times a day (TID) | ORAL | 3 refills | Status: DC

## 2021-01-01 MED ORDER — TRAZODONE HCL 100 MG PO TABS: 100.0000 mg | ORAL_TABLET | Freq: Every day | ORAL | 3 refills | Status: DC

## 2021-01-01 NOTE — Patient Instructions (Addendum)
Lots going on.  I need to chase down several things in that they could be serious. Coughing blood.  We will start with a chest xray.  The fact that you quit smoking a long time ago is good.  Still, I need to make sure this is not serious. Rectal bleeding.  You are due for a colonoscopy anyway to follow up the colon polyps.  Someone should call about that appointment. The leg cramps are a nuisance problem.  Not serious, but can make you feel bad.  Remember the stretches I showed you.  Best if several times per day - especially right before bed.   I will call with the lab test results.   I want you to schedule a follow up appointment with Dr. Marin Olp.   Plan on seeing me in two to three weeks.  We can plan next steps for investigating these concerns.

## 2021-01-01 NOTE — Assessment & Plan Note (Signed)
CXR ordered 

## 2021-01-01 NOTE — Assessment & Plan Note (Addendum)
Needs a deeper dive into follow up of cancer concerns, etc Check PT and PTT.   PT, PTT and platelets all normal.

## 2021-01-02 ENCOUNTER — Encounter: Payer: Self-pay | Admitting: Family Medicine

## 2021-01-02 LAB — CMP14+EGFR
ALT: 19 IU/L (ref 0–32)
AST: 15 IU/L (ref 0–40)
Albumin/Globulin Ratio: 2.1 (ref 1.2–2.2)
Albumin: 4.4 g/dL (ref 3.8–4.8)
Alkaline Phosphatase: 92 IU/L (ref 44–121)
BUN/Creatinine Ratio: 19 (ref 12–28)
BUN: 23 mg/dL (ref 8–27)
Bilirubin Total: 0.3 mg/dL (ref 0.0–1.2)
CO2: 24 mmol/L (ref 20–29)
Calcium: 9.4 mg/dL (ref 8.7–10.3)
Chloride: 102 mmol/L (ref 96–106)
Creatinine, Ser: 1.24 mg/dL — ABNORMAL HIGH (ref 0.57–1.00)
Globulin, Total: 2.1 g/dL (ref 1.5–4.5)
Glucose: 115 mg/dL — ABNORMAL HIGH (ref 70–99)
Potassium: 4.5 mmol/L (ref 3.5–5.2)
Sodium: 140 mmol/L (ref 134–144)
Total Protein: 6.5 g/dL (ref 6.0–8.5)
eGFR: 49 mL/min/{1.73_m2} — ABNORMAL LOW (ref >59)

## 2021-01-02 LAB — CBC
Hematocrit: 35.3 % (ref 34.0–46.6)
Hemoglobin: 11.6 g/dL (ref 11.1–15.9)
MCH: 28.4 pg (ref 26.6–33.0)
MCHC: 32.9 g/dL (ref 31.5–35.7)
MCV: 87 fL (ref 79–97)
Platelets: 213 10*3/uL (ref 150–450)
RBC: 4.08 x10E6/uL (ref 3.77–5.28)
RDW: 15 % (ref 11.7–15.4)
WBC: 9 10*3/uL (ref 3.4–10.8)

## 2021-01-02 LAB — PT AND PTT
INR: 1 (ref 0.9–1.2)
Prothrombin Time: 10.1 s (ref 9.1–12.0)
aPTT: 27 s (ref 24–33)

## 2021-01-02 NOTE — Assessment & Plan Note (Signed)
Refer to GI.  Likely needs FU colonoscopy.

## 2021-01-02 NOTE — Progress Notes (Signed)
    SUBJECTIVE:   CHIEF COMPLAINT / HPI:   Multiple issues: Some minor, some concerning. Episodes of coughing up blood.  Does not think it is draining from sinuses. No CP or SOB.  Duration ~2 months.  Quit smoking in the early 90s, so low risk for lung cancer. Episodes of rectal bleeding.  Also duration ~ 2 months.  No fever, night sweats, change in bowels.  Does have hx of adenomatous polyps of the colon.  Coincidentally, she is due for FU colonoscopy now. Leg cramps.  Mostly at night.  In calves.  No trauma Bruises easily.   Transaminitis noted on last blood draw.  This was when she had a febrile illness.  No previous elevations.  No hx of liver dysfunction. Insomnia.  Would like to increase trazadone.  Already on 100 mg qhs. Previously seen by Onc, Dr. Marin Olp 08/17/19, for GI complaints and wt loss concerning for cancer.  He wanted FU in 6 weeks and she did not return.  Wt has nicely stabilized.      OBJECTIVE:   BP 113/78   Pulse 94   Ht 5\' 8"  (1.727 m)   Wt 211 lb 6.4 oz (95.9 kg)   LMP 09/20/2011   SpO2 99%   BMI 32.14 kg/m   Lungs clear Cardiac RRR without m or g Abd benign   ASSESSMENT/PLAN:   Hemoptysis CXR ordered  Easy bruising Needs a deeper dive into follow up of cancer concerns, etc Check PT and PTT.   PT, PTT and platelets all normal.  Transaminitis Recheck and LFTs have returned to normal.  Rectal bleeding Refer to GI.  Likely needs FU colonoscopy.    Zenia Resides, MD [place]

## 2021-01-02 NOTE — Assessment & Plan Note (Signed)
Recheck and LFTs have returned to normal.

## 2021-01-03 ENCOUNTER — Ambulatory Visit: Payer: Medicare HMO | Admitting: Licensed Clinical Social Worker

## 2021-01-03 DIAGNOSIS — Z789 Other specified health status: Secondary | ICD-10-CM

## 2021-01-03 NOTE — Chronic Care Management (AMB) (Signed)
    Clinical Social Work  Care Management   Phone Outreach    01/03/2021 Name: Shannon Stuart MRN: [id number] DOB: 15-Jan-1961  Shannon Stuart is a 60 y.o. year old female who is a primary care patient of Hensel, Jamal Collin, MD .   Reason for referral: Mental Health Counseling and Resources.    F/U phone call today to assess needs, progress and barriers with care plan goals.   Unable to keep phone appointment today and requested to reschedule during a time when she is feeling better. Reports PCP has order some test for her.  Plan:Appointment was rescheduled with CCM 01/24/2021  Review of patient status, including review of consultants reports, relevant laboratory and other test results, and collaboration with appropriate care team members and the patient's provider was performed as part of comprehensive patient evaluation and provision of care management services.     [name], [place] / [place]   [phone]

## 2021-01-03 NOTE — Patient Instructions (Signed)
   It was a pleasure speaking with you today. per your request your appointment is scheduled 01/24/2021 Please call the office if needed  [name], [place] Management [facility] Coordination  [phone]

## 2021-01-04 DIAGNOSIS — M5416 Radiculopathy, lumbar region: Secondary | ICD-10-CM | POA: Diagnosis not present

## 2021-01-04 DIAGNOSIS — M5136 Other intervertebral disc degeneration, lumbar region: Secondary | ICD-10-CM | POA: Diagnosis not present

## 2021-01-04 DIAGNOSIS — I10 Essential (primary) hypertension: Secondary | ICD-10-CM | POA: Diagnosis not present

## 2021-01-04 DIAGNOSIS — Z6832 Body mass index (BMI) 32.0-32.9, adult: Secondary | ICD-10-CM | POA: Diagnosis not present

## 2021-01-04 DIAGNOSIS — F1120 Opioid dependence, uncomplicated: Secondary | ICD-10-CM | POA: Diagnosis not present

## 2021-01-08 ENCOUNTER — Ambulatory Visit (INDEPENDENT_AMBULATORY_CARE_PROVIDER_SITE_OTHER): Payer: Medicare HMO

## 2021-01-08 DIAGNOSIS — Z23 Encounter for immunization: Secondary | ICD-10-CM

## 2021-01-22 DIAGNOSIS — M1612 Unilateral primary osteoarthritis, left hip: Secondary | ICD-10-CM | POA: Diagnosis not present

## 2021-01-23 ENCOUNTER — Ambulatory Visit (INDEPENDENT_AMBULATORY_CARE_PROVIDER_SITE_OTHER): Payer: Medicare HMO | Admitting: Family Medicine

## 2021-01-23 ENCOUNTER — Encounter: Payer: Self-pay | Admitting: Family Medicine

## 2021-01-23 ENCOUNTER — Other Ambulatory Visit: Payer: Self-pay

## 2021-01-23 DIAGNOSIS — R7303 Prediabetes: Secondary | ICD-10-CM | POA: Diagnosis not present

## 2021-01-23 DIAGNOSIS — M1612 Unilateral primary osteoarthritis, left hip: Secondary | ICD-10-CM | POA: Diagnosis not present

## 2021-01-23 DIAGNOSIS — M545 Low back pain, unspecified: Secondary | ICD-10-CM | POA: Diagnosis not present

## 2021-01-23 DIAGNOSIS — R042 Hemoptysis: Secondary | ICD-10-CM

## 2021-01-23 LAB — POCT GLYCOSYLATED HEMOGLOBIN (HGB A1C): HbA1c, POC (prediabetic range): 6.3 % (ref 5.7–6.4)

## 2021-01-23 NOTE — Patient Instructions (Addendum)
I want you to look up cymbalta/duloxetine.  It is a good medicine that treats both depression and sciatica.  It may be a good medicine for you.   I will check on the GI referral. Someone should call about a pulmonary referral because of coughing up blood.   I will call with the results of your A1C= diabetes test.  Right now, you are prediabetic, not officially diabetes.

## 2021-01-24 ENCOUNTER — Ambulatory Visit: Payer: Medicare HMO | Admitting: Licensed Clinical Social Worker

## 2021-01-24 ENCOUNTER — Encounter: Payer: Self-pay | Admitting: Physician Assistant

## 2021-01-24 ENCOUNTER — Encounter: Payer: Self-pay | Admitting: Family Medicine

## 2021-01-24 DIAGNOSIS — Z7189 Other specified counseling: Secondary | ICD-10-CM

## 2021-01-24 DIAGNOSIS — F32A Depression, unspecified: Secondary | ICD-10-CM

## 2021-01-24 DIAGNOSIS — M1612 Unilateral primary osteoarthritis, left hip: Secondary | ICD-10-CM | POA: Insufficient documentation

## 2021-01-24 DIAGNOSIS — F418 Other specified anxiety disorders: Secondary | ICD-10-CM

## 2021-01-24 NOTE — Progress Notes (Signed)
    SUBJECTIVE:   CHIEF COMPLAINT / HPI:   Several issues.  Overview - patient has a strong bias to less meds and minimize surgeries Left hip and leg pain.  Patient has chronic back pain and some of her symptoms sound radicular.  And some of her symptoms seem to localize to the hip.  CT of abd and pelvis 08/15/20 showed advanced left hip osteoarthritis.  She just got a hip injection yesterday.  Hurt more yesterday after injection.  Feels better today - perhaps a little better than her baseline.   Still coughing up blood regularly.  Feels it is coming from lungs.  Lungs normal on CT of 08/15/20 Prediabetes.  Needs A1C recheck.      OBJECTIVE:   BP 128/88   Pulse 93   Ht 5\' 8"  (1.727 m)   Wt 212 lb 3.2 oz (96.3 kg)   LMP 09/20/2011   SpO2 96%   BMI 32.26 kg/m   Lungs clear Cardiac RRR without m or g Hip limited ROM of left hip.  ASSESSMENT/PLAN:   Prediabetes Remains in the prediabetes range.  Osteoarthritis of left hip We will see if she gets benefit from the injection.  I gave her my opinion that hip surgery is a good surgery and she should strongly consider if recommended.    Low back pain potentially associated with radiculopathy Likely has a radicular componant.  Does not gabapentin.  Has not heard of cymbalta.  I asked her to consider.    Hemoptysis Pulm referral entered.     Zenia Resides, MD [place]

## 2021-01-24 NOTE — Assessment & Plan Note (Signed)
Remains in the prediabetes range.

## 2021-01-24 NOTE — Assessment & Plan Note (Signed)
We will see if she gets benefit from the injection.  I gave her my opinion that hip surgery is a good surgery and she should strongly consider if recommended.

## 2021-01-24 NOTE — Assessment & Plan Note (Signed)
Likely has a radicular componant.  Does not gabapentin.  Has not heard of cymbalta.  I asked her to consider.

## 2021-01-24 NOTE — Chronic Care Management (AMB) (Signed)
Care Management  Clinical Social Work Note  01/24/2021 Name: DEARI SESSLER MRN: [id number] DOB: 02/07/1961  [name] is a 60 y.o. year old female who is a primary care patient of Hensel, Jamal Collin, MD. The CCM team was consulted for assistance with care coordination needs.  Connect for ongoing counseling     Consent to Services:  The patient was given information about Care Management services, agreed to services, and gave verbal consent prior to initiation of services.  Please see initial visit note for detailed documentation.   Patient agreed to services today and consent obtained.  Engaged with patient by telephone for follow up visit in response to provider referral for social work care coordination services.   Assessment/Interventions: Assessed patient's current treatment, progress, coping skills, support system and barriers to care.  She is making progress with managing her stress and emotions . She continues to have reservations about starting Cymbalta as recommended by PCP but will continue to research this medication. See Care Plan below for interventions and patient self-care actives.  Recent life changes or stressors: increase in health concerns  Recommendation: Patient may benefit from, and is in agreement for LCSW to make referral to connect for ongoing counseling to assist with preparing her mentally for upcoming medical challenges.   Follow up Plan: Patient would like continued follow-up from CCM LCSW .  per patient's request will follow up in 30 days.  Will call office if needed prior to next encounter.   Review of patient past medical history, allergies, medications, and health status, including review of pertinent consultant reports was performed as part of comprehensive evaluation and provision of care management/care coordination services.   SDOH (Social Determinants of Health) screening and interventions performed today:  SDOH Interventions    Flowsheet Row Most  Recent Value  SDOH Interventions   SDOH Interventions for the Following Domains Stress  Stress Interventions Other (Comment), Provide Counseling  [referral for counseling]       Advanced Directives Status:Not addressed in this encounter.     Care Plan    Conditions to be addressed/monitored per PCP order: Anxiety and Depression,   Care Plan : Clinical Social Work  Updates made by Maurine Cane, LCSW since 01/24/2021 12:00 AM     Problem: symptoms of anxiety & Depression   Priority: High     Goal: enhance coping skills by connecting for therapy   Start Date: 02/04/2020  This Visit's Progress: On track  Recent Progress: On track  Priority: High  Note:   Current barriers:   Chronic Mental Health needs related to symptoms of depression  and anxiety Needs Support, Education, and Care Coordination in order to meet unmet mental health needs. Clinical Goal(s): patient will work with SW to reduce and manage symptoms of anxiety, depression, and stress and increase coping skills, self-management skills, and stress reduction.  Clinical Interventions:  Provided interventions: Motivational Interviewing employed Solution-Focused Strategies employed: connect for ongoing therapy and discussed options Active listening / Reflection utilized  Engineer, petroleum Provided Participation in counseling encouraged  Verbalization of feelings encouraged  Made referral to [employer]  ; Inter-disciplinary care team collaboration (see longitudinal plan of care) Patient Goals/Self-Care Activities: Over the next 30 days Continue to avoid negative self-talk, practice positive thinking and self-talk Continue to spend time with your friend and talk to spiritual advisor Continue with daily task list    [name], [place] [facility] [place]  [facility]   [phone]

## 2021-01-24 NOTE — Patient Instructions (Signed)
Licensed Clinical Social Worker Visit Information  Goals we discussed today:   Goals Addressed             This Visit's Progress    Manage My Emotions   On track     Timeframe:  Long-Range Goal Priority:  High Start Date:   02/04/2020                         Expected End Date:  on going                       Patient Goals/Self-Care Activities:  You are doing a great job with managing your emotion  Continue to spend time with your friend and talk to spiritual advisor Continue with daily task list I have placed the referral to [facility] they will call you for an appointment         Patient agreed to services and verbal consent obtained.   Patient verbalizes understanding of instructions provided today and agrees to view in [place].  It was a pleasure speaking with you today. Please call the office if needed Follow up appointment is scheduled 02/22/2021  [name], [facility]  [phone]

## 2021-01-24 NOTE — Assessment & Plan Note (Addendum)
Pulm referral entered.

## 2021-02-09 ENCOUNTER — Ambulatory Visit (INDEPENDENT_AMBULATORY_CARE_PROVIDER_SITE_OTHER): Payer: Medicare HMO | Admitting: Physician Assistant

## 2021-02-09 ENCOUNTER — Other Ambulatory Visit (INDEPENDENT_AMBULATORY_CARE_PROVIDER_SITE_OTHER): Payer: Medicare HMO

## 2021-02-09 ENCOUNTER — Ambulatory Visit (INDEPENDENT_AMBULATORY_CARE_PROVIDER_SITE_OTHER)
Admission: RE | Admit: 2021-02-09 | Discharge: 2021-02-09 | Disposition: A | Discharge status: Home / Self Care | Payer: Medicare HMO | Source: Ambulatory Visit | Attending: Physician Assistant | Admitting: Physician Assistant

## 2021-02-09 ENCOUNTER — Other Ambulatory Visit: Payer: Self-pay

## 2021-02-09 ENCOUNTER — Encounter: Payer: Self-pay | Admitting: Physician Assistant

## 2021-02-09 VITALS — BP 124/82 | HR 82 | Ht 68.5 in | Wt 211.0 lb

## 2021-02-09 DIAGNOSIS — Z86010 Personal history of colonic polyps: Secondary | ICD-10-CM

## 2021-02-09 DIAGNOSIS — D649 Anemia, unspecified: Secondary | ICD-10-CM

## 2021-02-09 DIAGNOSIS — K219 Gastro-esophageal reflux disease without esophagitis: Secondary | ICD-10-CM

## 2021-02-09 DIAGNOSIS — K625 Hemorrhage of anus and rectum: Secondary | ICD-10-CM

## 2021-02-09 DIAGNOSIS — R042 Hemoptysis: Secondary | ICD-10-CM

## 2021-02-09 LAB — CBC WITH DIFFERENTIAL/PLATELET
Basophils Absolute: 0.1 10*3/uL (ref 0.0–0.1)
Basophils Relative: 0.9 % (ref 0.0–3.0)
Eosinophils Absolute: 0.2 10*3/uL (ref 0.0–0.7)
Eosinophils Relative: 1.8 % (ref 0.0–5.0)
HCT: 39.1 % (ref 36.0–46.0)
Hemoglobin: 12.6 g/dL (ref 12.0–15.0)
Lymphocytes Relative: 31.6 % (ref 12.0–46.0)
Lymphs Abs: 3.2 10*3/uL (ref 0.7–4.0)
MCHC: 32.4 g/dL (ref 30.0–36.0)
MCV: 88.2 fl (ref 78.0–100.0)
Monocytes Absolute: 0.5 10*3/uL (ref 0.1–1.0)
Monocytes Relative: 4.8 % (ref 3.0–12.0)
Neutro Abs: 6.1 10*3/uL (ref 1.4–7.7)
Neutrophils Relative %: 60.9 % (ref 43.0–77.0)
Platelets: 198 10*3/uL (ref 150.0–400.0)
RBC: 4.43 Mil/uL (ref 3.87–5.11)
RDW: 16.8 % — ABNORMAL HIGH (ref 11.5–15.5)
WBC: 10.1 10*3/uL (ref 4.0–10.5)

## 2021-02-09 MED ORDER — PANTOPRAZOLE SODIUM 40 MG PO TBEC: 40.0000 mg | DELAYED_RELEASE_TABLET | Freq: Every day | ORAL | 3 refills | Status: DC

## 2021-02-09 MED ORDER — NA SULFATE-K SULFATE-MG SULF 17.5-3.13-1.6 GM/177ML PO SOLN: 1.0000 | Freq: Once | ORAL | 0 refills | Status: AC

## 2021-02-09 NOTE — Progress Notes (Signed)
Subjective:    Patient ID: Shannon Stuart, female    DOB: 1960/10/30, 60 y.o.   MRN: [id number]  HPI "Shannon Stuart" is a 60 year old African-American female, established with Dr. Ardis Hughs, who comes in today with complaints of rectal bleeding which has been present intermittently but frequently over the past 6 months.  She was referred back by Dr. Andria Frames. She was last seen here in February 2021 and at that time underwent EGD because of complaints of nausea and vomiting.  She was found to have mild gastritis, biopsies negative for H. pylori and no intestinal metaplasia. She has had prior colonoscopy in May 2019 with finding of several small subepithelial nodules in the right colon-there were 7 and 8 of these, one was tunnel biopsied and path showed benign lymphoid polyp.  She also had 2 other 4 to 7 mm polyps which were tubular adenomas, no internal hemorrhoids documented. Patient has history of hypertension, coronary artery disease, osteoarthritis, chronic low back pain and obesity.  Also with chronic GERD.  She stays on Protonix 40 mg p.o. every morning which works fairly well though she still has problems with belching and burping which she thought would improve after she had her gallbladder removed. She says that she has been noticing some bright red blood per rectum once or twice per week, she has seen blood in the stool primarily.  She has no complaints of anal rectal pain, no change in bowel habits constipation or diarrhea.  No melena. She also says she has been intermittently coughing up some blood over the past 6 months.  This usually occurs early in the morning after she gets up she says she will start coughing and has seen streaks of red blood in the sputum.  Specifically says that she is not vomiting or regurgitating up the blood.  No shortness of breath.  She has upcoming appointment with pulmonary and chest x-ray is pending.   Review of Systems Pertinent positive and negative review of systems were  noted in the above HPI section.  All other review of systems was otherwise negative.   Outpatient Encounter Medications as of 02/09/2021  Medication Sig   ALPRAZolam (XANAX) 1 MG tablet TAKE 1 TABLET BY MOUTH 3 TIMES DAILY.   amLODipine (NORVASC) 5 MG tablet Take 1 tablet (5 mg total) by mouth daily.   cholecalciferol (VITAMIN D3) 25 MCG (1000 UNIT) tablet Take 3,000 Units by mouth daily.   cyclobenzaprine (FLEXERIL) 10 MG tablet Take 1 tablet (10 mg total) by mouth 3 (three) times daily.   diclofenac Sodium (VOLTAREN) 1 % GEL APPLY 2 GRAMS TOPICALLY FOUR TIMES DAILY   ibuprofen (ADVIL) 800 MG tablet Take 800 mg by mouth 3 (three) times daily.   lisinopril (ZESTRIL) 40 MG tablet Take 1 tablet (40 mg total) by mouth daily.   Na Sulfate-K Sulfate-Mg Sulf 17.5-3.13-1.6 GM/177ML SOLN Take 1 kit by mouth once for 1 dose.   Omega-3 Fatty Acids (FISH OIL) 1000 MG CAPS Take 1,000 mg by mouth daily.    oxyCODONE-acetaminophen (PERCOCET) 10-325 MG tablet Take 1 tablet by mouth in the morning, at noon, and at bedtime.   potassium chloride SA (KLOR-CON) 20 MEQ tablet Take 1 tablet (20 mEq total) by mouth daily.   promethazine (PHENERGAN) 25 MG suppository Place 1 suppository (25 mg total) rectally every 6 (six) hours as needed for nausea or vomiting.   rosuvastatin (CRESTOR) 20 MG tablet TAKE 1 TABLET EVERY DAY   traZODone (DESYREL) 100 MG tablet Take 1  tablet (100 mg total) by mouth at bedtime.   vitamin B-12 (CYANOCOBALAMIN) 100 MCG tablet Take 100 mcg by mouth once a week.   [DISCONTINUED] pantoprazole (PROTONIX) 40 MG tablet Take 1 tablet (40 mg total) by mouth daily.   pantoprazole (PROTONIX) 40 MG tablet Take 1 tablet (40 mg total) by mouth daily before breakfast.   No facility-administered encounter medications on file as of 02/09/2021.   Allergies  Allergen Reactions   Morphine And Related Other (See Comments)    Makes "loopy" and chest "feel funny".    Zofran [Ondansetron Hcl] Nausea And  Vomiting   Patient Active Problem List   Diagnosis Date Noted   Osteoarthritis of left hip 01/24/2021   Rectal bleeding 01/01/2021   Hemoptysis 01/01/2021   Easy bruising 01/01/2021   Transaminitis 01/01/2021   Meningioma ([place]) 06/07/2020   Fatigue 06/07/2020   Breast cancer screening 06/07/2020   Shoulder pain    Emesis 11/13/2018   Generalized abdominal pain    Intractable vomiting    Diarrhea with dehydration 11/11/2018   Leukocytosis    Elevated serum creatinine 03/06/2017   Skin lesion of right arm 01/02/2017   Fingernail abnormalities 01/02/2017   Rash 12/31/2016   Changes in vision 09/20/2016   Amplified musculoskeletal pain, diffuse 09/20/2016   Trigger point of extremity 09/20/2016   Chronic diarrhea 09/20/2016   Chronic bilateral lower abdominal pain 09/20/2016   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[place]) 09/17/2016   Hypokalemia due to excessive gastrointestinal loss of potassium 08/30/2016   Bone lesion 08/30/2016   Non-obstructive CAD    Obesity, Class II, BMI 35-39.9    Low back pain potentially associated with radiculopathy 10/12/2013   Well woman exam 09/02/2013   Prediabetes 06/02/2013   Trigger point of shoulder region 02/20/2013   Insomnia 08/18/2012   Arthritis of both knees 04/11/2011   Lower extremity edema 09/24/2010   Depression 08/10/2010   HLD (hyperlipidemia) 08/04/2009   Leiomyoma of uterus 04/29/2008   Dysfunctional uterine bleeding 04/26/2008   Anxiety with depression 09/15/2006   RHINITIS, ALLERGIC NOS 09/08/2006   GERD 09/08/2006   HYPERTENSION, BENIGN ESSENTIAL 08/04/2006   Social History   Socioeconomic History   Marital status: Single    Spouse name: Not on file   Number of children: 1   Years of education: Not on file   Highest education level: Not on file  Occupational History   Occupation: retired  Tobacco Use   Smoking status: Former    Types: Cigarettes    Quit date: 03/26/1991    Years since quitting: 29.8   Smokeless tobacco:  Never  Vaping Use   Vaping Use: Never used  Substance and Sexual Activity   Alcohol use: Yes    Comment: 'SOCIALLY"   Drug use: Yes    Types: Marijuana    Comment: occ brownie   Sexual activity: Yes    Partners: Female  Other Topics Concern   Not on file  Social History Narrative   Not on file   Social Determinants of Health   Financial Resource Strain: Not on file  Food Insecurity: Food Insecurity Present   Worried About Running Out of Food in the Last Year: Sometimes true   Ran Out of Food in the Last Year: Sometimes true  Transportation Needs: Not on file  Physical Activity: Not on file  Stress: No Stress Concern Present   Feeling of Stress : Only a little  Social Connections: Not on file  Intimate Partner Violence: Not on  file    Ms. Persico's family history includes Asthma in her sister; COPD in her sister; Diabetes in her maternal grandmother, sister, and sister; Heart disease in her maternal grandmother; Heart disease (age of onset: 42) in her mother; Heart failure in her maternal grandmother; Hypertension in an other family member.      Objective:    Vitals:   02/09/21 1021  BP: 124/82  Pulse: 82    Physical Exam Well-developed well-nourished African-American female in no acute distress.  Height, Weight 211, BMI 31.6  HEENT; nontraumatic normocephalic, EOMI, PE R LA, sclera anicteric. Oropharynx; not examined today Neck; supple, no JVD Cardiovascular; regular rate and rhythm with S1-S2, no murmur rub or gallop Pulmonary; Clear bilaterally Abdomen; soft, obese, there is mild tenderness in the left lower quadrant, no guarding, nondistended, no palpable mass or hepatosplenomegaly, bowel sounds are active Rectal; not done today Skin; benign exam, no jaundice rash or appreciable lesions Extremities; no clubbing cyanosis or edema skin warm and dry Neuro/Psych; alert and oriented x4, grossly nonfocal mood and affect appropriate        Assessment & Plan:   #36  60 year old African-American female with 95-month history of intermittent rectal bleeding, noticing blood in the stool.  On exam today she is tender in the left lower abdomen She has history of tubular adenomatous colon polyps, last exam May 2019, no internal hemorrhoids documented at that time.  She also had several subepithelial nodules in the right colon which were biopsied and found to be benign lymphoid polyps.  Etiology of the rectal bleeding is not clear, rule out colon lesion, neoplasm Rule out internal hemorrhoids though none documented at last colonoscopy  #2 chronic GERD-continue Protonix 40 mg p.o. every morning AC breakfast #3 hemoptysis-chest x-ray is pending, pulmonary consultation scheduled  #4 osteoarthritis #5.  Status postcholecystectomy #6.  Hypertension #7.  Borderline elevated kappa light chain #8 nonobstructive coronary artery disease  Plan; continue Protonix 40 mg p.o. every morning CBC with differential today Chest x-ray PA and lateral Patient will be scheduled for colonoscopy with Dr. Ardis Hughs.  Procedure was discussed in detail with the patient including indications risks and benefits and she is agreeable to proceed   Alfredia Ferguson PA-C 02/09/2021   Cc: Zenia Resides, MD

## 2021-02-09 NOTE — Patient Instructions (Signed)
If you are age 62 or younger, your body mass index should be between 19-25. Your Body mass index is 31.62 kg/m. If this is out of the aformentioned range listed, please consider follow up with your Primary Care Provider.  ________________________________________________________  The [facility] providers would like to encourage you to use [facility] to communicate with providers for non-urgent requests or questions.  Due to long hold times on the telephone, sending your provider a message by [facility] may be a faster and more efficient way to get a response.  Please allow 48 business hours for a response.  Please remember that this is for non-urgent requests.  _______________________________________________________  Shannon Stuart have been scheduled for a colonoscopy. Please follow written instructions given to you at your visit today.  Please pick up your prep supplies at the pharmacy within the next 1-3 days. If you use inhalers (even only as needed), please bring them with you on the day of your procedure.  Your provider has requested that you go to the basement level for lab work before leaving today. Press "B" on the elevator. The lab is located at the first door on the left as you exit the elevator.  Your provider has requested that you go to the basement level for a chest x-ray before leaving today. Press "B" on the elevator. The imaging is located straight ahead as you exit the elevator.  Continue Pantoprazole 40 mg 1 tablet before breakfast.  Follow up pending at this time.  Thank you for entrusting me with your care and choosing [facility].  Amy Esterwood, PA-C

## 2021-02-10 NOTE — Progress Notes (Signed)
I agree with the above note, plan 

## 2021-02-13 ENCOUNTER — Encounter: Payer: Self-pay | Admitting: Internal Medicine

## 2021-02-13 ENCOUNTER — Ambulatory Visit: Payer: Medicare HMO | Admitting: Internal Medicine

## 2021-02-13 ENCOUNTER — Other Ambulatory Visit: Payer: Self-pay

## 2021-02-13 VITALS — BP 110/60 | HR 86 | Temp 99.8°F | Ht 68.5 in | Wt 213.0 lb

## 2021-02-13 DIAGNOSIS — R042 Hemoptysis: Secondary | ICD-10-CM | POA: Diagnosis not present

## 2021-02-13 DIAGNOSIS — R053 Chronic cough: Secondary | ICD-10-CM
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
## 2021-02-13 NOTE — Patient Instructions (Signed)
Try a wedge pillow to help with reflux.    What is GERD? Gastroesophageal reflux disease (GERD) is gastroesophageal reflux diseasewhich occurs when the lower esophageal sphincter (LES) opens spontaneously, for varying periods of time, or does not close properly and stomach contents rise up into the esophagus. GER is also called acid reflux or acid regurgitation, because digestive juices--called acids--rise up with the food. The esophagus is the tube that carries food from the mouth to the stomach. The LES is a ring of muscle at the bottom of the esophagus that acts like a valve between the esophagus and stomach.  When acid reflux occurs, food or fluid can be tasted in the back of the mouth. When refluxed stomach acid touches the lining of the esophagus it may cause a burning sensation in the chest or throat called heartburn or acid indigestion. Occasional reflux is common. Persistent reflux that occurs more than twice a week is considered GERD, and it can eventually lead to more serious health problems. People of all ages can have GERD. Studies have shown that GERD may worsen or contribute to asthma, chronic cough, and pulmonary fibrosis.   What are the symptoms of GERD? The main symptom of GERD in adults is frequent heartburn, also called acid indigestion--burning-type pain in the lower part of the mid-chest, behind the breast bone, and in the mid-abdomen.  Not all reflux is acidic in nature, and many patients don't have heart burn at all. Sometimes it feels like a cough (either dry or with mucus), choking sensation, asthma, shortness of breath, waking up at night, frequent throat clearing, or trouble swallowing.    What causes GERD? The reason some people develop GERD is still unclear. However, research shows that in people with GERD, the LES relaxes while the rest of the esophagus is working. Anatomical abnormalities such as a hiatal hernia may also contribute to GERD. A hiatal hernia occurs  when the upper part of the stomach and the LES move above the diaphragm, the muscle wall that separates the stomach from the chest. Normally, the diaphragm helps the LES keep acid from rising up into the esophagus. When a hiatal hernia is present, acid reflux can occur more easily. A hiatal hernia can occur in people of any age and is most often a normal finding in otherwise healthy people over age 57. Most of the time, a hiatal hernia produces no symptoms.   Other factors that may contribute to GERD include - Obesity or recent weight gain - Pregnancy  - Smoking  - Diet - Certain medications  Common foods that can worsen reflux symptoms include: - carbonated beverages - artificial sweeteners - citrus fruits  - chocolate  - drinks with caffeine or alcohol  - fatty and fried foods  - garlic and onions  - mint flavorings  - spicy foods  - tomato-based foods, like spaghetti sauce, salsa, chili, and pizza   Lifestyle Changes If you smoke, stop.  Avoid foods and beverages that worsen symptoms (see above.) Lose weight if needed.  Eat small, frequent meals.  Wear loose-fitting clothes.  Avoid lying down for 3 hours after a meal.  Raise the head of your bed 6 to 8 inches by securing wood blocks under the bedposts. Just using extra pillows will not help, but using a wedge-shaped pillow may be helpful.  Medications  H2 blockers, such as cimetidine (Tagamet HB), famotidine (Pepcid AC), nizatidine (Axid AR), and ranitidine (Zantac 75), decrease acid production. They are available in prescription  strength and over-the-counter strength. These drugs provide short-term relief and are effective for about half of those who have GERD symptoms.  Proton pump inhibitors include omeprazole (Prilosec, Zegerid), lansoprazole (Prevacid), pantoprazole (Protonix), rabeprazole (Aciphex), and esomeprazole (Nexium), which are available by prescription. Prilosec is also available in over-the-counter strength.  Proton pump inhibitors are more effective than H2 blockers and can relieve symptoms and heal the esophageal lining in almost everyone who has GERD.  Because drugs work in different ways, combinations of medications may help control symptoms. People who get heartburn after eating may take both antacids and H2 blockers. The antacids work first to neutralize the acid in the stomach, and then the H2 blockers act on acid production. By the time the antacid stops working, the H2 blocker will have stopped acid production. Your health care provider is the best source of information about how to use medications for GERD.   Points to Remember 1. You can have GERD without having heartburn. Your symptoms could include a dry cough, asthma symptoms, or trouble swallowing.  2. Taking medications daily as prescribed is important in controlling you symptoms.  Sometimes it can take up to 8 weeks to fully achieve the effects of the medications prescribed.  3. Coughing related to GERD can be difficult to treat and is very frustrating!  However, it is important to stick with these medications and lifestyle modifications before pursuing more aggressive or invasive test and treatments.

## 2021-02-21 DIAGNOSIS — D329 Benign neoplasm of meninges, unspecified: Secondary | ICD-10-CM | POA: Diagnosis not present

## 2021-02-21 DIAGNOSIS — Z6832 Body mass index (BMI) 32.0-32.9, adult: Secondary | ICD-10-CM | POA: Diagnosis not present

## 2021-02-21 DIAGNOSIS — I10 Essential (primary) hypertension: Secondary | ICD-10-CM | POA: Diagnosis not present

## 2021-02-21 DIAGNOSIS — M5416 Radiculopathy, lumbar region: Secondary | ICD-10-CM | POA: Diagnosis not present

## 2021-02-21 DIAGNOSIS — M542 Cervicalgia: Secondary | ICD-10-CM | POA: Diagnosis not present

## 2021-02-22 ENCOUNTER — Telehealth: Payer: Medicare HMO

## 2021-02-22 ENCOUNTER — Telehealth: Payer: Self-pay | Admitting: Licensed Clinical Social Worker

## 2021-02-22 NOTE — Chronic Care Management (AMB) (Signed)
    Clinical Social Work  Care Management   Phone Outreach    02/22/2021 Name: Shannon Stuart MRN: [id number] DOB: 06/22/1960  [name] is a 61 y.o. year old female who is a primary care patient of Hensel, Jamal Collin, MD .   Reason for referral: Mental Health Counseling and Resources.    F/U phone call today to assess needs, progress and barriers with care plan goals.   Telephone outreach was unsuccessful. A HIPPA compliant phone message was left for the patient providing contact information and requesting a return call.   Plan:CCM LCSW will wait for return call. If no return call is received, Will route chart to Care Guide to see if patient would like to reschedule phone appointment   Review of patient status, including review of consultants reports, relevant laboratory and other test results, and collaboration with appropriate care team members and the patient's provider was performed as part of comprehensive patient evaluation and provision of care management services.    [name], [place] / [place]   [phone]

## 2021-03-14 ENCOUNTER — Encounter: Payer: Self-pay | Admitting: Gastroenterology

## 2021-03-14 ENCOUNTER — Ambulatory Visit (AMBULATORY_SURGERY_CENTER): Payer: Medicare HMO | Admitting: Gastroenterology

## 2021-03-14 ENCOUNTER — Other Ambulatory Visit: Payer: Self-pay

## 2021-03-14 VITALS — BP 148/101 | HR 74 | Temp 98.2°F | Resp 16 | Ht 68.5 in | Wt 211.0 lb

## 2021-03-14 DIAGNOSIS — D123 Benign neoplasm of transverse colon: Secondary | ICD-10-CM | POA: Diagnosis not present

## 2021-03-14 DIAGNOSIS — Z86010 Personal history of colonic polyps: Secondary | ICD-10-CM | POA: Diagnosis not present

## 2021-03-14 DIAGNOSIS — I10 Essential (primary) hypertension: Secondary | ICD-10-CM | POA: Diagnosis not present

## 2021-03-14 DIAGNOSIS — K648 Other hemorrhoids: Secondary | ICD-10-CM

## 2021-03-14 DIAGNOSIS — K625 Hemorrhage of anus and rectum: Secondary | ICD-10-CM

## 2021-03-14 DIAGNOSIS — K920 Hematemesis: Secondary | ICD-10-CM | POA: Diagnosis not present

## 2021-03-14 DIAGNOSIS — D649 Anemia, unspecified: Secondary | ICD-10-CM | POA: Diagnosis not present

## 2021-03-14 MED ORDER — SODIUM CHLORIDE 0.9 % IV SOLN: 500.0000 mL | Freq: Once | INTRAVENOUS | Status: DC
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
## 2021-03-14 NOTE — Progress Notes (Signed)
Called to room to assist during endoscopic procedure.  Patient ID and intended procedure confirmed with present staff. Received instructions for my participation in the procedure from the performing physician.  

## 2021-03-14 NOTE — Op Note (Signed)
[facility] Patient Name: Shannon Stuart Procedure Date: 03/14/2021 10:52 AM MRN: [id number] Endoscopist: Milus Banister , MD Age: 60 Referring MD:  Date of Birth: 01/05/1961 Gender: Female Account #: [id number] Procedure:                Colonoscopy Indications:              High risk colon cancer surveillance: Personal                            history of colonic polyps; Colonoscopy 2015 two                            subCM adenoma; recent minor rectal bleeding Medicines:                Monitored Anesthesia Care Procedure:                Pre-Anesthesia Assessment:                           - Prior to the procedure, a History and Physical                            was performed, and patient medications and                            allergies were reviewed. The patient's tolerance of                            previous anesthesia was also reviewed. The risks                            and benefits of the procedure and the sedation                            options and risks were discussed with the patient.                            All questions were answered, and informed consent                            was obtained. Prior Anticoagulants: The patient has                            taken no previous anticoagulant or antiplatelet                            agents. ASA Grade Assessment: III - A patient with                            severe systemic disease. After reviewing the risks                            and benefits, the patient was deemed in  satisfactory condition to undergo the procedure.                           After obtaining informed consent, the colonoscope                            was passed under direct vision. Throughout the                            procedure, the patient's blood pressure, pulse, and                            oxygen saturations were monitored continuously. The                            Olympus CF-HQ190L  ([id number]) Colonoscope was                            introduced through the anus and advanced to the the                            cecum, identified by appendiceal orifice and                            ileocecal valve. The colonoscopy was performed                            without difficulty. The patient tolerated the                            procedure well. The quality of the bowel                            preparation was good. The ileocecal valve,                            appendiceal orifice, and rectum were photographed. Scope In: 10:59:28 AM Scope Out: 11:12:41 AM Scope Withdrawal Time: 0 hours 9 minutes 33 seconds  Total Procedure Duration: 0 hours 13 minutes 13 seconds  Findings:                 Several small (7-75mm) subepithelial lesion spread                            throughout the solon (4-5). These were previously                            sampled in 2015, appear unchanged.                           A 3 mm polyp was found in the transverse colon. The                            polyp was sessile. The polyp was removed with a  cold snare. Resection and retrieval were complete.                           Small internal and external hemorrhoids.                           The exam was otherwise without abnormality on                            direct and retroflexion views. Complications:            No immediate complications. Estimated blood loss:                            None. Estimated Blood Loss:     Estimated blood loss: none. Impression:               - Several small (7-67mm) subepithelial lesion spread                            throughout the solon (4-5). These were previously                            sampled in 2015, appear unchanged.                           - One 3 mm polyp in the transverse colon, removed                            with a cold snare. Resected and retrieved.                           - Hemorrhoids.                            - The examination was otherwise normal on direct                            and retroflexion views. Recommendation:           - Patient has a contact number available for                            emergencies. The signs and symptoms of potential                            delayed complications were discussed with the                            patient. Return to normal activities tomorrow.                            Written discharge instructions were provided to the                            patient.                           -  Resume previous diet.                           - Continue present medications.                           - Await pathology results. Milus Banister, MD 03/14/2021 11:26:08 AM This report has been signed electronically.

## 2021-03-14 NOTE — Progress Notes (Signed)
Pt's states no medical or surgical changes since previsit or office visit.   VS taken by CW 

## 2021-03-14 NOTE — Progress Notes (Signed)
Report to PACU, RN, vss, BBS= Clear.  

## 2021-03-14 NOTE — Patient Instructions (Signed)
Please read handouts provided. Continue present medications. Await pathology results.   YOU HAD AN ENDOSCOPIC PROCEDURE TODAY AT THE [facility]:   Refer to the procedure report that was given to you for any specific questions about what was found during the examination.  If the procedure report does not answer your questions, please call your gastroenterologist to clarify.  If you requested that your care partner not be given the details of your procedure findings, then the procedure report has been included in a sealed envelope for you to review at your convenience later.  YOU SHOULD EXPECT: Some feelings of bloating in the abdomen. Passage of more gas than usual.  Walking can help get rid of the air that was put into your GI tract during the procedure and reduce the bloating. If you had a lower endoscopy (such as a colonoscopy or flexible sigmoidoscopy) you may notice spotting of blood in your stool or on the toilet paper. If you underwent a bowel prep for your procedure, you may not have a normal bowel movement for a few days.  Please Note:  You might notice some irritation and congestion in your nose or some drainage.  This is from the oxygen used during your procedure.  There is no need for concern and it should clear up in a day or so.  SYMPTOMS TO REPORT IMMEDIATELY:  Following lower endoscopy (colonoscopy or flexible sigmoidoscopy):  Excessive amounts of blood in the stool  Significant tenderness or worsening of abdominal pains  Swelling of the abdomen that is new, acute  Fever of 100F or higher   For urgent or emergent issues, a gastroenterologist can be reached at any hour by calling [phone]. Do not use MyChart messaging for urgent concerns.    DIET:  We do recommend a small meal at first, but then you may proceed to your regular diet.  Drink plenty of fluids but you should avoid alcoholic beverages for 24 hours.  ACTIVITY:  You should plan to take it easy  for the rest of today and you should NOT DRIVE or use heavy machinery until tomorrow (because of the sedation medicines used during the test).    FOLLOW UP: Our staff will call the number listed on your records 48-72 hours following your procedure to check on you and address any questions or concerns that you may have regarding the information given to you following your procedure. If we do not reach you, we will leave a message.  We will attempt to reach you two times.  During this call, we will ask if you have developed any symptoms of COVID 19. If you develop any symptoms (ie: fever, flu-like symptoms, shortness of breath, cough etc.) before then, please call [phone].  If you test positive for Covid 19 in the 2 weeks post procedure, please call and report this information to us.    If any biopsies were taken you will be contacted by phone or by letter within the next 1-3 weeks.  Please call us at [phone] if you have not heard about the biopsies in 3 weeks.    SIGNATURES/CONFIDENTIALITY: You and/or your care partner have signed paperwork which will be entered into your electronic medical record.  These signatures attest to the fact that that the information above on your After Visit Summary has been reviewed and is understood.  Full responsibility of the confidentiality of this discharge information lies with you and/or your care-partner.  

## 2021-03-16 ENCOUNTER — Telehealth: Payer: Self-pay

## 2021-03-16 ENCOUNTER — Telehealth: Payer: Self-pay | Admitting: *Deleted

## 2021-03-16 NOTE — Telephone Encounter (Signed)
°  Follow up Call-  Call back number 03/14/2021 05/19/2019  Post procedure Call Back phone  # [phone]  Permission to leave phone message Yes Yes  Some recent data might be hidden   LMOM to call back with any questions or concerns.  Also, call back if patient has developed fever, respiratory issues or been dx with COVID or had any family members or close contacts diagnosed since her procedure.

## 2021-03-16 NOTE — Telephone Encounter (Signed)
Left message on follow up call. 

## 2021-03-20 ENCOUNTER — Telehealth: Payer: Self-pay | Admitting: Gastroenterology

## 2021-03-20 NOTE — Telephone Encounter (Signed)
Proof of procedure with Dr. Ardis Hughs has been faxed to the number provided by the patient. Pt called and is aware.

## 2021-03-20 NOTE — Telephone Encounter (Signed)
Patient called stated that insurance said there is no proof that she was seen by Dr. Ardis Hughs, asking if there is anyway that paperwork with proof that she was seen could be sent to Fax number [phone]  Please advise.

## 2021-03-21 ENCOUNTER — Telehealth: Payer: Self-pay | Admitting: *Deleted

## 2021-03-21 NOTE — Telephone Encounter (Signed)
Refill not appropriate.  Patient has elevated creatinine and GI had stopped ibuprofen in past.  Patient will need to be seen to evaluate condition needing ibuprofen.

## 2021-03-21 NOTE — Telephone Encounter (Signed)
Refill request for Ibuprofen 800 mg tablet came in and it is on the current med list from a historical provider.  Please send if appropriate.Renise Gillies Zimmerman Rumple, CMA

## 2021-03-22 NOTE — Telephone Encounter (Signed)
Informed pt of below and she wasn't very happy.  Appointment scheduled with Dr. Andria Frames to discuss.Shannon Stuart, CMA

## 2021-03-27 ENCOUNTER — Encounter: Payer: Self-pay | Admitting: Gastroenterology

## 2021-03-28 ENCOUNTER — Other Ambulatory Visit: Payer: Self-pay

## 2021-03-28 ENCOUNTER — Encounter: Payer: Self-pay | Admitting: Family Medicine

## 2021-03-28 ENCOUNTER — Ambulatory Visit (INDEPENDENT_AMBULATORY_CARE_PROVIDER_SITE_OTHER): Payer: Medicare HMO | Admitting: Family Medicine

## 2021-03-28 DIAGNOSIS — E782 Mixed hyperlipidemia: Secondary | ICD-10-CM

## 2021-03-28 DIAGNOSIS — M75101 Unspecified rotator cuff tear or rupture of right shoulder, not specified as traumatic: Secondary | ICD-10-CM | POA: Diagnosis not present

## 2021-03-28 DIAGNOSIS — M545 Low back pain, unspecified: Secondary | ICD-10-CM

## 2021-03-28 DIAGNOSIS — M75102 Unspecified rotator cuff tear or rupture of left shoulder, not specified as traumatic: Secondary | ICD-10-CM

## 2021-03-28 DIAGNOSIS — I10 Essential (primary) hypertension: Secondary | ICD-10-CM | POA: Diagnosis not present

## 2021-03-28 MED ORDER — IBUPROFEN 800 MG PO TABS: 800.0000 mg | ORAL_TABLET | Freq: Three times a day (TID) | ORAL | 1 refills | Status: DC | PRN

## 2021-03-28 MED ORDER — ZOSTER VAC RECOMB ADJUVANTED 50 MCG/0.5ML IM SUSR: 0.5000 mL | Freq: Once | INTRAMUSCULAR | 1 refills | Status: AC

## 2021-03-28 MED ORDER — KETOROLAC TROMETHAMINE 30 MG/ML IJ SOLN
30.0000 mg | Freq: Once | INTRAMUSCULAR | Status: AC
  Administered 2021-03-28: 30 mg via INTRAMUSCULAR

## 2021-03-28 NOTE — Patient Instructions (Addendum)
I refilled your ibuprofen.  Remember, once you hit 63, we should switch you off that medication.   I sent in the shingles vaccine prescription to [facility].   Remember, the shingles vaccine may give you side effects. I will call with the cholesterol results.

## 2021-03-29 LAB — LIPID PANEL
Chol/HDL Ratio: 3.4 ratio (ref 0.0–4.4)
Cholesterol, Total: 156 mg/dL (ref 100–199)
HDL: 46 mg/dL (ref >39)
LDL Chol Calc (NIH): 89 mg/dL (ref 0–99)
Triglycerides: 119 mg/dL (ref 0–149)
VLDL Cholesterol Cal: 21 mg/dL (ref 5–40)

## 2021-03-30 ENCOUNTER — Telehealth: Payer: Self-pay | Admitting: Family Medicine

## 2021-03-30 ENCOUNTER — Encounter: Payer: Self-pay | Admitting: Family Medicine

## 2021-03-30 NOTE — Assessment & Plan Note (Signed)
Tolerating rosuvastatin well.  LDL less than 100.

## 2021-03-30 NOTE — Assessment & Plan Note (Signed)
No intervention at this time.  

## 2021-03-30 NOTE — Telephone Encounter (Signed)
Called patient.  I initially misread labs and thought her LDL was high - last year's reading.  LDL is fine and she is taking her rosuvastatin daily.  Good response, no change.

## 2021-03-30 NOTE — Progress Notes (Signed)
° ° °  SUBJECTIVE:   CHIEF COMPLAINT / HPI:   Multiple issues: Wants refill on ibuprofen which she takes for low back pain.  Denies indigestion and history of GI problems.  No hx of renal problems. Back is bad today.  Would like toradol injection which she has tolerated well in the past. Both shoulders are moderately painful. Right > left.  Previous left shoulder surgery.   Hypercholesterolemia in a prediabetic.  Goal LDL less than 100.  Tolerating rosuvastatin well.   HPDP: Due for shingrix.  Discussed and she will take.    OBJECTIVE:   BP 106/90    Pulse 93    Ht 5\' 9"  (1.753 m)    Wt 213 lb (96.6 kg)    LMP 09/20/2011    SpO2 98%    BMI 31.45 kg/m   Lungs clear Cardiac RRR without m or g  ASSESSMENT/PLAN:   HLD (hyperlipidemia) Tolerating rosuvastatin well.  LDL less than 100.    Low back pain potentially associated with radiculopathy Toradol today. Long discussion that NSAIDs are Beers drugs and she is approaching age 63.  Typcially, chronic low back pain does not need antiinflamatory medication, only pain med such as tylenol which is generally safer.  She desires to stick with ibuprofen until age 61.  Since no renal or gi problems, I agreed to that plan.  Rotator cuff syndrome of both shoulders No intervention at this time.       Zenia Resides, MD [facility]

## 2021-03-30 NOTE — Assessment & Plan Note (Signed)
Toradol today. Long discussion that NSAIDs are Beers drugs and she is approaching age 61.  Typcially, chronic low back pain does not need antiinflamatory medication, only pain med such as tylenol which is generally safer.  She desires to stick with ibuprofen until age 74.  Since no renal or gi problems, I agreed to that plan.

## 2021-04-30 ENCOUNTER — Other Ambulatory Visit: Payer: Self-pay | Admitting: Orthopaedic Surgery

## 2021-05-01 ENCOUNTER — Other Ambulatory Visit: Payer: Self-pay | Admitting: Orthopaedic Surgery

## 2021-05-10 ENCOUNTER — Telehealth: Payer: Self-pay

## 2021-05-10 DIAGNOSIS — M1612 Unilateral primary osteoarthritis, left hip: Secondary | ICD-10-CM

## 2021-05-10 NOTE — Telephone Encounter (Signed)
DME order entered as requested.

## 2021-05-10 NOTE — Telephone Encounter (Signed)
Patient calls nurse line requesting an order for a Rolator.   Patient reports she is having hip surgery on 3/7 and would like PCP to be aware.   Please let me know when order is placed.

## 2021-05-14 NOTE — Telephone Encounter (Signed)
Confirmation received from [employer].  

## 2021-05-14 NOTE — Telephone Encounter (Signed)
Community message sent to [employer]. 

## 2021-05-15 NOTE — Patient Instructions (Addendum)
DUE TO COVID-19 ONLY ONE VISITOR IS ALLOWED TO COME WITH YOU AND STAY IN THE WAITING ROOM ONLY DURING PRE OP AND PROCEDURE.   **NO VISITORS ARE ALLOWED IN THE SHORT STAY AREA OR RECOVERY ROOM!!**  IF YOU WILL BE ADMITTED INTO THE HOSPITAL YOU ARE ALLOWED ONLY TWO SUPPORT PEOPLE DURING VISITATION HOURS ONLY (7 AM -8PM)   The support person(s) must pass our screening, gel in and out, and wear a mask at all times, including in the patients room. Patients must also wear a mask when staff or their support person are in the room. Visitors GUEST BADGE MUST BE WORN VISIBLY  One adult visitor may remain with you overnight and MUST be in the room by 8 P.M.  No visitors under the age of 16. Any visitor under the age of 33 must be accompanied by an adult.        Your procedure is scheduled on: 05/29/21   Report to [facility] Main Entrance    Report to admitting at: 10:00 AM   Call this number if you have problems the morning of surgery [phone]   Do not eat food :After Midnight.   May have liquids until : 9:30 AM   day of surgery  CLEAR LIQUID DIET  Foods Allowed                                                                     Foods Excluded  Water, Black Coffee and tea, regular and decaf                             liquids that you cannot  Plain Jell-O in any flavor  (No red)                                           see through such as: Fruit ices (not with fruit pulp)                                     milk, soups, orange juice              Iced Popsicles (No red)                                    All solid food                                   Apple juices Sports drinks like Gatorade (No red) Lightly seasoned clear broth or consume(fat free) Sugar Sample Menu Breakfast                                Lunch  Supper Cranberry juice                    Beef broth                            Chicken broth Jell-O                                      Grape juice                           Apple juice Coffee or tea                        Jell-O                                      Popsicle                                                Coffee or tea                        Coffee or tea   Complete one Gatorade drink the morning of surgery 3 hours prior to scheduled surgery at: 9:30 AM.    The day of surgery:  Drink ONE (1) Pre-Surgery Clear Ensure or G2 at AM the morning of surgery. Drink in one sitting. Do not sip.  This drink was given to you during your hospital  pre-op appointment visit. Nothing else to drink after completing the  Pre-Surgery Clear Ensure or G2.          If you have questions, please contact your surgeons office.  FOLLOW BOWEL PREP AND ANY ADDITIONAL PRE OP INSTRUCTIONS YOU RECEIVED FROM YOUR SURGEON'S OFFICE!!!   Oral Hygiene is also important to reduce your risk of infection.                                    Remember - BRUSH YOUR TEETH THE MORNING OF SURGERY WITH YOUR REGULAR TOOTHPASTE   Do NOT smoke after Midnight   Take these medicines the morning of surgery with A SIP OF WATER: alprazolam,amlodipine,pantoprazole.  DO NOT TAKE ANY ORAL DIABETIC MEDICATIONS DAY OF YOUR SURGERY                              You may not have any metal on your body including hair pins, jewelry, and body piercing             Do not wear make-up, lotions, powders, perfumes/cologne, or deodorant  Do not wear nail polish including gel and S&S, artificial/acrylic nails, or any other type of covering on natural nails including finger and toenails. If you have artificial nails, gel coating, etc. that needs to be removed by a nail salon please have this removed prior to surgery or surgery may need to be canceled/ delayed if the surgeon/ anesthesia feels like they are unable to be  safely monitored.   Do not shave  48 hours prior to surgery.    Do not bring valuables to the hospital. [place].   Contacts, dentures or bridgework may not be worn into surgery.   Bring small overnight bag day of surgery.    Patients discharged on the day of surgery will not be allowed to drive home.  Someone needs to stay with you for the first 24 hours after anesthesia.   Special Instructions: Bring a copy of your healthcare power of attorney and living will documents         the day of surgery if you haven't scanned them before.              Please read over the following fact sheets you were given: IF YOU HAVE QUESTIONS ABOUT YOUR PRE-OP INSTRUCTIONS PLEASE CALL [phone]     [facility] - Preparing for Surgery Before surgery, you can play an important role.  Because skin is not sterile, your skin needs to be as free of germs as possible.  You can reduce the number of germs on your skin by washing with CHG (chlorahexidine gluconate) soap before surgery.  CHG is an antiseptic cleaner which kills germs and bonds with the skin to continue killing germs even after washing. Please DO NOT use if you have an allergy to CHG or antibacterial soaps.  If your skin becomes reddened/irritated stop using the CHG and inform your nurse when you arrive at Short Stay. Do not shave (including legs and underarms) for at least 48 hours prior to the first CHG shower.  You may shave your face/neck. Please follow these instructions carefully:  1.  Shower with CHG Soap the night before surgery and the  morning of Surgery.  2.  If you choose to wash your hair, wash your hair first as usual with your  normal  shampoo.  3.  After you shampoo, rinse your hair and body thoroughly to remove the  shampoo.                           4.  Use CHG as you would any other liquid soap.  You can apply chg directly  to the skin and wash                       Gently with a scrungie or clean washcloth.  5.  Apply the CHG Soap to your body ONLY FROM THE NECK DOWN.   Do not use on face/ open                           Wound or  open sores. Avoid contact with eyes, ears mouth and genitals (private parts).                       Wash face,  Genitals (private parts) with your normal soap.             6.  Wash thoroughly, paying special attention to the area where your surgery  will be performed.  7.  Thoroughly rinse your body with warm water from the neck down.  8.  DO NOT shower/wash with your normal soap after using and rinsing off  the CHG Soap.  9.  Pat yourself dry with a clean towel.            10.  Wear clean pajamas.            11.  Place clean sheets on your bed the night of your first shower and do not  sleep with pets. Day of Surgery : Do not apply any lotions/deodorants the morning of surgery.  Please wear clean clothes to the hospital/surgery center.  FAILURE TO FOLLOW THESE INSTRUCTIONS MAY RESULT IN THE CANCELLATION OF YOUR SURGERY PATIENT SIGNATURE_________________________________  NURSE SIGNATURE__________________________________  ________________________________________________________________________   Shannon Stuart  An incentive spirometer is a tool that can help keep your lungs clear and active. This tool measures how well you are filling your lungs with each breath. Taking long deep breaths may help reverse or decrease the chance of developing breathing (pulmonary) problems (especially infection) following: A long period of time when you are unable to move or be active. BEFORE THE PROCEDURE  If the spirometer includes an indicator to show your best effort, your nurse or respiratory therapist will set it to a desired goal. If possible, sit up straight or lean slightly forward. Try not to slouch. Hold the incentive spirometer in an upright position. INSTRUCTIONS FOR USE  Sit on the edge of your bed if possible, or sit up as far as you can in bed or on a chair. Hold the incentive spirometer in an upright position. Breathe out normally. Place the mouthpiece in your mouth and  seal your lips tightly around it. Breathe in slowly and as deeply as possible, raising the piston or the ball toward the top of the column. Hold your breath for 3-5 seconds or for as long as possible. Allow the piston or ball to fall to the bottom of the column. Remove the mouthpiece from your mouth and breathe out normally. Rest for a few seconds and repeat Steps 1 through 7 at least 10 times every 1-2 hours when you are awake. Take your time and take a few normal breaths between deep breaths. The spirometer may include an indicator to show your best effort. Use the indicator as a goal to work toward during each repetition. After each set of 10 deep breaths, practice coughing to be sure your lungs are clear. If you have an incision (the cut made at the time of surgery), support your incision when coughing by placing a pillow or rolled up towels firmly against it. Once you are able to get out of bed, walk around indoors and cough well. You may stop using the incentive spirometer when instructed by your caregiver.  RISKS AND COMPLICATIONS Take your time so you do not get dizzy or light-headed. If you are in pain, you may need to take or ask for pain medication before doing incentive spirometry. It is harder to take a deep breath if you are having pain. AFTER USE Rest and breathe slowly and easily. It can be helpful to keep track of a log of your progress. Your caregiver can provide you with a simple table to help with this. If you are using the spirometer at home, follow these instructions: [place] IF:  You are having difficultly using the spirometer. You have trouble using the spirometer as often as instructed. Your pain medication is not giving enough relief while using the spirometer. You develop fever of 100.5 F (38.1 C) or higher. SEEK IMMEDIATE MEDICAL CARE IF:  You cough up bloody sputum that had not  been present before. You develop fever of 102 F (38.9 C) or greater. You  develop worsening pain at or near the incision site. MAKE SURE YOU:  Understand these instructions. Will watch your condition. Will get help right away if you are not doing well or get worse. Document Released: 07/22/2006 Document Revised: 06/03/2011 Document Reviewed: 09/22/2006 [facility] Patient Information 2014 [place], [place].   ________________________________________________________________________

## 2021-05-16 ENCOUNTER — Encounter (HOSPITAL_COMMUNITY)
Admission: RE | Admit: 2021-05-16 | Discharge: 2021-05-16 | Disposition: A | Discharge status: Home / Self Care | Payer: Medicare HMO | Source: Ambulatory Visit | Attending: Orthopaedic Surgery | Admitting: Orthopaedic Surgery

## 2021-05-16 ENCOUNTER — Encounter (HOSPITAL_COMMUNITY): Payer: Self-pay

## 2021-05-16 ENCOUNTER — Other Ambulatory Visit: Payer: Self-pay

## 2021-05-16 VITALS — BP 117/80 | HR 75 | Temp 98.9°F | Ht 68.5 in | Wt 211.0 lb

## 2021-05-16 DIAGNOSIS — Z01812 Encounter for preprocedural laboratory examination: Secondary | ICD-10-CM | POA: Insufficient documentation

## 2021-05-16 DIAGNOSIS — G4733 Obstructive sleep apnea (adult) (pediatric): Secondary | ICD-10-CM

## 2021-05-16 DIAGNOSIS — Z01818 Encounter for other preprocedural examination: Secondary | ICD-10-CM

## 2021-05-16 DIAGNOSIS — R7303 Prediabetes: Secondary | ICD-10-CM | POA: Diagnosis not present

## 2021-05-16 DIAGNOSIS — I10 Essential (primary) hypertension: Secondary | ICD-10-CM | POA: Diagnosis not present

## 2021-05-16 HISTORY — DX: Prediabetes: R73.03

## 2021-05-16 LAB — BASIC METABOLIC PANEL
Anion gap: 7 (ref 5–15)
BUN: 20 mg/dL (ref 8–23)
CO2: 24 mmol/L (ref 22–32)
Calcium: 8.6 mg/dL — ABNORMAL LOW (ref 8.9–10.3)
Chloride: 108 mmol/L (ref 98–111)
Creatinine, Ser: 1.01 mg/dL — ABNORMAL HIGH (ref 0.44–1.00)
GFR, Estimated: >60 mL/min (ref >60)
Glucose, Bld: 101 mg/dL — ABNORMAL HIGH (ref 70–99)
Potassium: 3.9 mmol/L (ref 3.5–5.1)
Sodium: 139 mmol/L (ref 135–145)

## 2021-05-16 LAB — CBC
HCT: 37.6 % (ref 36.0–46.0)
Hemoglobin: 11.9 g/dL — ABNORMAL LOW (ref 12.0–15.0)
MCH: 28.8 pg (ref 26.0–34.0)
MCHC: 31.6 g/dL (ref 30.0–36.0)
MCV: 91 fL (ref 80.0–100.0)
Platelets: 205 10*3/uL (ref 150–400)
RBC: 4.13 MIL/uL (ref 3.87–5.11)
RDW: 16.7 % — ABNORMAL HIGH (ref 11.5–15.5)
WBC: 8.2 10*3/uL (ref 4.0–10.5)
nRBC: 0 % (ref 0.0–0.2)

## 2021-05-16 LAB — SURGICAL PCR SCREEN
MRSA, PCR: NEGATIVE
Staphylococcus aureus: NEGATIVE

## 2021-05-16 LAB — HEMOGLOBIN A1C
Hgb A1c MFr Bld: 6.7 % — ABNORMAL HIGH (ref 4.8–5.6)
Mean Plasma Glucose: 146 mg/dL

## 2021-05-16 LAB — TYPE AND SCREEN
ABO/RH(D): B POS
Antibody Screen: NEGATIVE

## 2021-05-16 LAB — GLUCOSE, CAPILLARY: Glucose-Capillary: 110 mg/dL — ABNORMAL HIGH (ref 70–99)

## 2021-05-16 NOTE — Progress Notes (Signed)
°   05/16/21 1117  OBSTRUCTIVE SLEEP APNEA  Have you ever been diagnosed with sleep apnea through a sleep study? No  Do you snore loudly (loud enough to be heard through closed doors)?  1  Do you often feel tired, fatigued, or sleepy during the daytime (such as falling asleep during driving or talking to someone)? 1  Has anyone observed you stop breathing during your sleep? 1  Do you have, or are you being treated for high blood pressure? 1  BMI more than 35 kg/m2? 0  Age > 50 (1-yes) 1  Neck circumference greater than:Female 16 inches or larger, Female 17inches or larger? 0  Female Gender (Yes=1) 0  Obstructive Sleep Apnea Score 5

## 2021-05-16 NOTE — Progress Notes (Deleted)
°   05/16/21 1139  OBSTRUCTIVE SLEEP APNEA  Score 5 or greater  Results sent to PCP

## 2021-05-16 NOTE — Progress Notes (Addendum)
COVID test- N/A  Bowel prep reminder: N/A  PCP - Dr. Madison Hickman Cardiologist - N/A  Chest x-ray - 02/09/21: EPIC EKG - 08/16/20: EPIC Stress Test -  ECHO - 03/27/17 Cardiac Cath -  Pacemaker/ICD device last checked:  Sleep Study -  CPAP -   Fasting Blood Sugar - N/A Checks Blood Sugar _____ times a day  Blood Thinner Instructions: Aspirin Instructions: Last Dose:  Anesthesia review: HX: HTN,CAD  Patient denies shortness of breath, fever, cough and chest pain at PAT appointment   Patient verbalized understanding of instructions that were given to them at the PAT appointment. Patient was also instructed that they will need to review over the PAT instructions again at home before surgery.

## 2021-05-17 DIAGNOSIS — G4730 Sleep apnea, unspecified: Secondary | ICD-10-CM | POA: Insufficient documentation
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
## 2021-05-29 ENCOUNTER — Ambulatory Visit (HOSPITAL_COMMUNITY): Payer: Medicare HMO

## 2021-05-29 ENCOUNTER — Observation Stay (HOSPITAL_COMMUNITY)
Admission: RE | Admit: 2021-05-29 | Discharge: 2021-05-30 | Disposition: A | Discharge status: Home Health | Payer: Medicare HMO | Attending: Orthopaedic Surgery | Admitting: Orthopaedic Surgery

## 2021-05-29 ENCOUNTER — Ambulatory Visit (HOSPITAL_BASED_OUTPATIENT_CLINIC_OR_DEPARTMENT_OTHER): Payer: Medicare HMO | Admitting: Anesthesiology

## 2021-05-29 ENCOUNTER — Ambulatory Visit (HOSPITAL_COMMUNITY): Payer: Medicare HMO | Admitting: Physician Assistant

## 2021-05-29 ENCOUNTER — Encounter (HOSPITAL_COMMUNITY)
Admission: RE | Disposition: A | Discharge status: Home Health | Payer: Self-pay | Source: Home / Self Care | Attending: Orthopaedic Surgery

## 2021-05-29 ENCOUNTER — Encounter (HOSPITAL_COMMUNITY): Payer: Self-pay | Admitting: Orthopaedic Surgery

## 2021-05-29 ENCOUNTER — Other Ambulatory Visit: Payer: Self-pay

## 2021-05-29 DIAGNOSIS — I2510 Atherosclerotic heart disease of native coronary artery without angina pectoris: Secondary | ICD-10-CM | POA: Diagnosis not present

## 2021-05-29 DIAGNOSIS — Z01818 Encounter for other preprocedural examination: Secondary | ICD-10-CM

## 2021-05-29 DIAGNOSIS — M1612 Unilateral primary osteoarthritis, left hip: Principal | ICD-10-CM | POA: Insufficient documentation

## 2021-05-29 DIAGNOSIS — Z96642 Presence of left artificial hip joint: Secondary | ICD-10-CM | POA: Diagnosis present

## 2021-05-29 DIAGNOSIS — R7303 Prediabetes: Secondary | ICD-10-CM | POA: Insufficient documentation

## 2021-05-29 DIAGNOSIS — F418 Other specified anxiety disorders: Secondary | ICD-10-CM | POA: Diagnosis not present

## 2021-05-29 DIAGNOSIS — Z20822 Contact with and (suspected) exposure to covid-19: Secondary | ICD-10-CM | POA: Insufficient documentation

## 2021-05-29 DIAGNOSIS — Z79899 Other long term (current) drug therapy: Secondary | ICD-10-CM | POA: Insufficient documentation

## 2021-05-29 DIAGNOSIS — I10 Essential (primary) hypertension: Secondary | ICD-10-CM | POA: Insufficient documentation

## 2021-05-29 DIAGNOSIS — Z87891 Personal history of nicotine dependence: Secondary | ICD-10-CM | POA: Insufficient documentation

## 2021-05-29 DIAGNOSIS — Z419 Encounter for procedure for purposes other than remedying health state, unspecified: Secondary | ICD-10-CM

## 2021-05-29 DIAGNOSIS — M25552 Pain in left hip: Secondary | ICD-10-CM | POA: Diagnosis present

## 2021-05-29 DIAGNOSIS — Z96643 Presence of artificial hip joint, bilateral: Secondary | ICD-10-CM

## 2021-05-29 HISTORY — DX: Presence of artificial hip joint, bilateral: Z96.643

## 2021-05-29 HISTORY — PX: TOTAL HIP ARTHROPLASTY: SHX124

## 2021-05-29 LAB — GLUCOSE, CAPILLARY: Glucose-Capillary: 123 mg/dL — ABNORMAL HIGH (ref 70–99)

## 2021-05-29 LAB — ABO/RH: ABO/RH(D): B POS

## 2021-05-29 LAB — SARS CORONAVIRUS 2 BY RT PCR (HOSPITAL ORDER, PERFORMED IN ~~LOC~~ HOSPITAL LAB): SARS Coronavirus 2: NEGATIVE

## 2021-05-29 SURGERY — ARTHROPLASTY, HIP, TOTAL, ANTERIOR APPROACH
Anesthesia: Spinal | Site: Hip | Laterality: Left

## 2021-05-29 MED ORDER — DIPHENHYDRAMINE HCL 12.5 MG/5ML PO ELIX: 12.5000 mg | ORAL_SOLUTION | ORAL | Status: DC | PRN

## 2021-05-29 MED ORDER — MIDAZOLAM HCL 5 MG/5ML IJ SOLN
INTRAMUSCULAR | Status: DC | PRN
  Administered 2021-05-29: 2 mg via INTRAVENOUS

## 2021-05-29 MED ORDER — CEFAZOLIN SODIUM-DEXTROSE 2-4 GM/100ML-% IV SOLN
2.0000 g | INTRAVENOUS | Status: AC
Start: 2021-05-29 — End: 2021-05-29
  Administered 2021-05-29: 2 g via INTRAVENOUS
  Filled 2021-05-29: qty 100

## 2021-05-29 MED ORDER — TRANEXAMIC ACID-NACL 1000-0.7 MG/100ML-% IV SOLN
1000.0000 mg | Freq: Once | INTRAVENOUS | Status: AC
  Administered 2021-05-29: 1000 mg via INTRAVENOUS
  Filled 2021-05-29: qty 100

## 2021-05-29 MED ORDER — AMLODIPINE BESYLATE 5 MG PO TABS
5.0000 mg | ORAL_TABLET | Freq: Every day | ORAL | Status: DC
  Administered 2021-05-30: 5 mg via ORAL
  Filled 2021-05-29: qty 1

## 2021-05-29 MED ORDER — PROPOFOL 1000 MG/100ML IV EMUL
INTRAVENOUS | Status: AC
  Filled 2021-05-29: qty 100

## 2021-05-29 MED ORDER — ALPRAZOLAM 1 MG PO TABS
1.0000 mg | ORAL_TABLET | Freq: Three times a day (TID) | ORAL | Status: DC
  Administered 2021-05-29 – 2021-05-30 (×2): 1 mg via ORAL
  Filled 2021-05-29 (×2): qty 1

## 2021-05-29 MED ORDER — BUPIVACAINE HCL (PF) 0.75 % IJ SOLN
INTRAMUSCULAR | Status: DC | PRN
  Administered 2021-05-29: 1.8 mL

## 2021-05-29 MED ORDER — POVIDONE-IODINE 10 % EX SWAB
2.0000 "application " | Freq: Once | CUTANEOUS | Status: AC
  Administered 2021-05-29: 2 via TOPICAL

## 2021-05-29 MED ORDER — PHENYLEPHRINE HCL (PRESSORS) 10 MG/ML IV SOLN
INTRAVENOUS | Status: AC
  Filled 2021-05-29: qty 1

## 2021-05-29 MED ORDER — METOCLOPRAMIDE HCL 5 MG/ML IJ SOLN: 5.0000 mg | Freq: Three times a day (TID) | INTRAMUSCULAR | Status: DC | PRN

## 2021-05-29 MED ORDER — ACETAMINOPHEN 325 MG PO TABS: 325.0000 mg | ORAL_TABLET | Freq: Four times a day (QID) | ORAL | Status: DC | PRN

## 2021-05-29 MED ORDER — FENTANYL CITRATE (PF) 100 MCG/2ML IJ SOLN
INTRAMUSCULAR | Status: DC | PRN
  Administered 2021-05-29: 100 ug via INTRAVENOUS

## 2021-05-29 MED ORDER — PROPOFOL 500 MG/50ML IV EMUL
INTRAVENOUS | Status: DC | PRN
Start: 2021-05-29 — End: 2021-05-29
  Administered 2021-05-29: 150 ug/kg/min via INTRAVENOUS

## 2021-05-29 MED ORDER — PROPOFOL 10 MG/ML IV BOLUS
INTRAVENOUS | Status: DC | PRN
  Administered 2021-05-29 (×2): 20 mg via INTRAVENOUS

## 2021-05-29 MED ORDER — OXYCODONE HCL 5 MG PO TABS
10.0000 mg | ORAL_TABLET | ORAL | Status: DC | PRN
  Administered 2021-05-30 (×2): 15 mg via ORAL
  Filled 2021-05-29 (×2): qty 3

## 2021-05-29 MED ORDER — 0.9 % SODIUM CHLORIDE (POUR BTL) OPTIME
TOPICAL | Status: DC | PRN
  Administered 2021-05-29: 1000 mL

## 2021-05-29 MED ORDER — MENTHOL 3 MG MT LOZG: 1.0000 | LOZENGE | OROMUCOSAL | Status: DC | PRN

## 2021-05-29 MED ORDER — KETOROLAC TROMETHAMINE 15 MG/ML IJ SOLN
15.0000 mg | Freq: Four times a day (QID) | INTRAMUSCULAR | Status: AC
  Administered 2021-05-29 – 2021-05-30 (×4): 15 mg via INTRAVENOUS
  Filled 2021-05-29 (×4): qty 1

## 2021-05-29 MED ORDER — ROSUVASTATIN CALCIUM 20 MG PO TABS
20.0000 mg | ORAL_TABLET | Freq: Every day | ORAL | Status: DC
  Administered 2021-05-30: 20 mg via ORAL
  Filled 2021-05-29: qty 1

## 2021-05-29 MED ORDER — FENTANYL CITRATE PF 50 MCG/ML IJ SOSY: 25.0000 ug | PREFILLED_SYRINGE | INTRAMUSCULAR | Status: DC | PRN

## 2021-05-29 MED ORDER — ACETAMINOPHEN 500 MG PO TABS
1000.0000 mg | ORAL_TABLET | Freq: Once | ORAL | Status: AC
  Administered 2021-05-29: 1000 mg via ORAL
  Filled 2021-05-29: qty 2

## 2021-05-29 MED ORDER — BUPIVACAINE LIPOSOME 1.3 % IJ SUSP: 10.0000 mL | Freq: Once | INTRAMUSCULAR | Status: DC

## 2021-05-29 MED ORDER — ALUM & MAG HYDROXIDE-SIMETH 200-200-20 MG/5ML PO SUSP: 30.0000 mL | ORAL | Status: DC | PRN

## 2021-05-29 MED ORDER — ORAL CARE MOUTH RINSE: 15.0000 mL | Freq: Once | OROMUCOSAL | Status: AC

## 2021-05-29 MED ORDER — PHENYLEPHRINE HCL-NACL 20-0.9 MG/250ML-% IV SOLN
INTRAVENOUS | Status: DC | PRN
  Administered 2021-05-29: 50 ug/min via INTRAVENOUS

## 2021-05-29 MED ORDER — BISACODYL 5 MG PO TBEC: 5.0000 mg | DELAYED_RELEASE_TABLET | Freq: Every day | ORAL | Status: DC | PRN

## 2021-05-29 MED ORDER — BUPIVACAINE-EPINEPHRINE (PF) 0.25% -1:200000 IJ SOLN
INTRAMUSCULAR | Status: DC | PRN
  Administered 2021-05-29: 30 mL via PERINEURAL

## 2021-05-29 MED ORDER — ONDANSETRON HCL 4 MG PO TABS: 4.0000 mg | ORAL_TABLET | Freq: Four times a day (QID) | ORAL | Status: DC | PRN

## 2021-05-29 MED ORDER — CEFAZOLIN SODIUM-DEXTROSE 2-4 GM/100ML-% IV SOLN
2.0000 g | Freq: Four times a day (QID) | INTRAVENOUS | Status: AC
  Administered 2021-05-29 – 2021-05-30 (×2): 2 g via INTRAVENOUS
  Filled 2021-05-29 (×2): qty 100

## 2021-05-29 MED ORDER — METOCLOPRAMIDE HCL 5 MG PO TABS: 5.0000 mg | ORAL_TABLET | Freq: Three times a day (TID) | ORAL | Status: DC | PRN

## 2021-05-29 MED ORDER — DOCUSATE SODIUM 100 MG PO CAPS
100.0000 mg | ORAL_CAPSULE | Freq: Two times a day (BID) | ORAL | Status: DC
  Administered 2021-05-29 – 2021-05-30 (×2): 100 mg via ORAL
  Filled 2021-05-29 (×2): qty 1

## 2021-05-29 MED ORDER — PHENOL 1.4 % MT LIQD: 1.0000 | OROMUCOSAL | Status: DC | PRN

## 2021-05-29 MED ORDER — ACETAMINOPHEN 500 MG PO TABS
1000.0000 mg | ORAL_TABLET | Freq: Four times a day (QID) | ORAL | Status: AC
  Administered 2021-05-29 – 2021-05-30 (×4): 1000 mg via ORAL
  Filled 2021-05-29 (×4): qty 2

## 2021-05-29 MED ORDER — LACTATED RINGERS IV SOLN: INTRAVENOUS | Status: DC

## 2021-05-29 MED ORDER — PROPOFOL 1000 MG/100ML IV EMUL
INTRAVENOUS | Status: AC
Start: 2021-05-29 — End: ?
  Filled 2021-05-29: qty 100

## 2021-05-29 MED ORDER — MIDAZOLAM HCL 2 MG/2ML IJ SOLN
INTRAMUSCULAR | Status: AC
  Filled 2021-05-29: qty 2

## 2021-05-29 MED ORDER — HYDROMORPHONE HCL 1 MG/ML IJ SOLN
0.5000 mg | INTRAMUSCULAR | Status: DC | PRN
  Administered 2021-05-29 – 2021-05-30 (×2): 1 mg via INTRAVENOUS
  Filled 2021-05-29 (×3): qty 1

## 2021-05-29 MED ORDER — FENTANYL CITRATE (PF) 100 MCG/2ML IJ SOLN
INTRAMUSCULAR | Status: AC
  Filled 2021-05-29: qty 2

## 2021-05-29 MED ORDER — BUPIVACAINE-EPINEPHRINE (PF) 0.25% -1:200000 IJ SOLN
INTRAMUSCULAR | Status: AC
  Filled 2021-05-29: qty 30

## 2021-05-29 MED ORDER — POTASSIUM CHLORIDE CRYS ER 20 MEQ PO TBCR
20.0000 meq | EXTENDED_RELEASE_TABLET | Freq: Every day | ORAL | Status: DC
Start: 2021-05-30 — End: 2021-05-30
  Administered 2021-05-30: 20 meq via ORAL
  Filled 2021-05-29: qty 1

## 2021-05-29 MED ORDER — METHOCARBAMOL 500 MG IVPB - SIMPLE MED
INTRAVENOUS | Status: AC
  Administered 2021-05-29: 500 mg via INTRAVENOUS
  Filled 2021-05-29: qty 50

## 2021-05-29 MED ORDER — TRAZODONE HCL 100 MG PO TABS
100.0000 mg | ORAL_TABLET | Freq: Every day | ORAL | Status: DC
  Administered 2021-05-29: 100 mg via ORAL
  Filled 2021-05-29: qty 1

## 2021-05-29 MED ORDER — BUPIVACAINE LIPOSOME 1.3 % IJ SUSP
INTRAMUSCULAR | Status: DC | PRN
  Administered 2021-05-29: 10 mL

## 2021-05-29 MED ORDER — BUPIVACAINE LIPOSOME 1.3 % IJ SUSP
INTRAMUSCULAR | Status: AC
  Filled 2021-05-29: qty 10

## 2021-05-29 MED ORDER — ONDANSETRON HCL 4 MG/2ML IJ SOLN: 4.0000 mg | Freq: Four times a day (QID) | INTRAMUSCULAR | Status: DC | PRN

## 2021-05-29 MED ORDER — CHLORHEXIDINE GLUCONATE 0.12 % MT SOLN
15.0000 mL | Freq: Once | OROMUCOSAL | Status: AC
  Administered 2021-05-29: 15 mL via OROMUCOSAL

## 2021-05-29 MED ORDER — LISINOPRIL 20 MG PO TABS
40.0000 mg | ORAL_TABLET | Freq: Every day | ORAL | Status: DC
  Administered 2021-05-30: 40 mg via ORAL
  Filled 2021-05-29: qty 2

## 2021-05-29 MED ORDER — OXYCODONE HCL 5 MG PO TABS
5.0000 mg | ORAL_TABLET | ORAL | Status: DC | PRN
  Administered 2021-05-29: 10 mg via ORAL
  Filled 2021-05-29: qty 2

## 2021-05-29 MED ORDER — METHOCARBAMOL 500 MG IVPB - SIMPLE MED
500.0000 mg | Freq: Four times a day (QID) | INTRAVENOUS | Status: DC | PRN
  Filled 2021-05-29: qty 50

## 2021-05-29 MED ORDER — TRANEXAMIC ACID 1000 MG/10ML IV SOLN
INTRAVENOUS | Status: DC | PRN
  Administered 2021-05-29: 2000 mg via TOPICAL

## 2021-05-29 MED ORDER — TRANEXAMIC ACID-NACL 1000-0.7 MG/100ML-% IV SOLN
1000.0000 mg | INTRAVENOUS | Status: AC
  Administered 2021-05-29: 1000 mg via INTRAVENOUS
  Filled 2021-05-29: qty 100

## 2021-05-29 MED ORDER — ASPIRIN 81 MG PO CHEW: 81.0000 mg | CHEWABLE_TABLET | Freq: Two times a day (BID) | ORAL | Status: DC

## 2021-05-29 MED ORDER — SODIUM CHLORIDE 0.9 % IV SOLN
2000.0000 mg | INTRAVENOUS | Status: DC
  Filled 2021-05-29: qty 20

## 2021-05-29 MED ORDER — HYDROMORPHONE HCL 1 MG/ML IJ SOLN
INTRAMUSCULAR | Status: AC
  Administered 2021-05-29: 0.5 mg via INTRAVENOUS
  Filled 2021-05-29: qty 1

## 2021-05-29 MED ORDER — PANTOPRAZOLE SODIUM 40 MG PO TBEC
40.0000 mg | DELAYED_RELEASE_TABLET | Freq: Every day | ORAL | Status: DC
  Administered 2021-05-30: 40 mg via ORAL
  Filled 2021-05-29: qty 1

## 2021-05-29 MED ORDER — DEXAMETHASONE SODIUM PHOSPHATE 10 MG/ML IJ SOLN
INTRAMUSCULAR | Status: AC
  Filled 2021-05-29: qty 1

## 2021-05-29 MED ORDER — DEXAMETHASONE SODIUM PHOSPHATE 10 MG/ML IJ SOLN
INTRAMUSCULAR | Status: DC | PRN
  Administered 2021-05-29: 10 mg via INTRAVENOUS

## 2021-05-29 MED ORDER — METHOCARBAMOL 500 MG PO TABS
500.0000 mg | ORAL_TABLET | Freq: Four times a day (QID) | ORAL | Status: DC | PRN
  Administered 2021-05-29 – 2021-05-30 (×3): 500 mg via ORAL
  Filled 2021-05-29 (×3): qty 1

## 2021-05-29 SURGICAL SUPPLY — 46 items
BAG COUNTER SPONGE SURGICOUNT (BAG) IMPLANT
BAG DECANTER FOR FLEXI CONT (MISCELLANEOUS) ×3 IMPLANT
BAG SPNG CNTER NS LX DISP (BAG)
BLADE SAW SGTL 18X1.27X75 (BLADE) ×3 IMPLANT
BOOTIES KNEE HIGH SLOAN (MISCELLANEOUS) ×3 IMPLANT
CELLS DAT CNTRL 66122 CELL SVR (MISCELLANEOUS) ×1 IMPLANT
COVER PERINEAL POST (MISCELLANEOUS) ×3 IMPLANT
COVER SURGICAL LIGHT HANDLE (MISCELLANEOUS) ×3 IMPLANT
CUP ACETABULAR GRIPTON 100 52 (Orthopedic Implant) IMPLANT
DRAPE FOOT SWITCH (DRAPES) ×3 IMPLANT
DRAPE IMP U-DRAPE 54X76 (DRAPES) ×3 IMPLANT
DRAPE STERI IOBAN 125X83 (DRAPES) ×3 IMPLANT
DRAPE U-SHAPE 47X51 STRL (DRAPES) ×6 IMPLANT
DRSG AQUACEL AG ADV 3.5X 6 (GAUZE/BANDAGES/DRESSINGS) ×3 IMPLANT
DRSG TELFA 4X10 ISLAND STR (GAUZE/BANDAGES/DRESSINGS) ×1 IMPLANT
DURAPREP 26ML APPLICATOR (WOUND CARE) ×3 IMPLANT
ELECT BLADE TIP CTD 4 INCH (ELECTRODE) ×3 IMPLANT
ELECT REM PT RETURN 15FT ADLT (MISCELLANEOUS) ×3 IMPLANT
ELIMINATOR HOLE APEX DEPUY (Hips) ×1 IMPLANT
GLOVE SRG 8 PF TXTR STRL LF DI (GLOVE) ×4 IMPLANT
GLOVE SURG ENC MOIS LTX SZ8 (GLOVE) ×6 IMPLANT
GLOVE SURG UNDER POLY LF SZ8 (GLOVE) ×4
GOWN STRL REUS W/TWL XL LVL3 (GOWN DISPOSABLE) ×6 IMPLANT
GRIPTON 100 52 (Orthopedic Implant) ×2 IMPLANT
HEAD CERAMIC 36 PLUS5 (Hips) ×1 IMPLANT
HOLDER FOLEY CATH W/STRAP (MISCELLANEOUS) ×3 IMPLANT
KIT TURNOVER KIT A (KITS) IMPLANT
LINER NEUTRAL 52X36MM PLUS 4 (Liner) ×1 IMPLANT
MANIFOLD NEPTUNE II (INSTRUMENTS) ×3 IMPLANT
NEEDLE HYPO 22GX1.5 SAFETY (NEEDLE) ×3 IMPLANT
NS IRRIG 1000ML POUR BTL (IV SOLUTION) ×3 IMPLANT
PACK ANTERIOR HIP CUSTOM (KITS) ×3 IMPLANT
PENCIL SMOKE EVACUATOR (MISCELLANEOUS) IMPLANT
PROTECTOR NERVE ULNAR (MISCELLANEOUS) ×3 IMPLANT
RETRACTOR WND ALEXIS 18 MED (MISCELLANEOUS) ×2 IMPLANT
RTRCTR WOUND ALEXIS 18CM MED (MISCELLANEOUS) ×2
SPIKE FLUID TRANSFER (MISCELLANEOUS) ×3 IMPLANT
STEM FEM ACTIS STD SZ4 (Stem) ×1 IMPLANT
SUT ETHIBOND NAB CT1 #1 30IN (SUTURE) ×6 IMPLANT
SUT VIC AB 1 CT1 36 (SUTURE) ×3 IMPLANT
SUT VIC AB 2-0 CT1 27 (SUTURE) ×2
SUT VIC AB 2-0 CT1 TAPERPNT 27 (SUTURE) ×2 IMPLANT
SUT VICRYL AB 3-0 FS1 BRD 27IN (SUTURE) ×3 IMPLANT
SUT VLOC 180 0 24IN GS25 (SUTURE) ×3 IMPLANT
SYR 50ML LL SCALE MARK (SYRINGE) ×3 IMPLANT
TRAY FOLEY MTR SLVR 16FR STAT (SET/KITS/TRAYS/PACK) ×3 IMPLANT

## 2021-05-29 NOTE — Interval H&P Note (Signed)
History and Physical Interval Note: ? ?05/29/2021 ?11:47 AM ? ?Shannon Stuart  has presented today for surgery, with the diagnosis of LEFT HIP DEGENERATIVE JOINT DISEASE.  The various methods of treatment have been discussed with the patient and family. After consideration of risks, benefits and other options for treatment, the patient has consented to  Procedure(s): ?LEFT TOTAL HIP ARTHROPLASTY ANTERIOR APPROACH (Left) as a surgical intervention.  The patient's history has been reviewed, patient examined, no change in status, stable for surgery.  I have reviewed the patient's chart and labs.  Questions were answered to the patient's satisfaction.   ? ? ?Monico Blitz Shamon Cothran ? ? ?

## 2021-05-29 NOTE — Anesthesia Preprocedure Evaluation (Addendum)
Anesthesia Evaluation  ?Patient identified by MRN, date of birth, ID band ?Patient awake ? ? ? ?Reviewed: ?Allergy & Precautions, NPO status , Patient's Chart, lab work & pertinent test results ? ?Airway ?Mallampati: II ? ?TM Distance: >3 FB ?Neck ROM: Full ? ? ? Dental ?no notable dental hx. ?(+) Teeth Intact, Dental Advisory Given ?  ?Pulmonary ?sleep apnea (no CPAP) , Patient abstained from smoking., former smoker,  ?  ?Pulmonary exam normal ?breath sounds clear to auscultation ? ? ? ? ? ? Cardiovascular ?hypertension, Pt. on medications ?+ CAD  ?Normal cardiovascular exam ?Rhythm:Regular Rate:Normal ? ?TTE 2019 ?- Left ventricle: The cavity size was normal. Systolic function was  ???normal. The estimated ejection fraction was in the range of 55%  ???to 60%. Wall motion was normal; there were no regional wall  ???motion abnormalities. Doppler parameters are consistent with  ???abnormal left ventricular relaxation (grade 1 diastolic  ???dysfunction). There was no evidence of elevated ventricular  ???filling pressure by Doppler parameters.  ?- Aortic valve: There was no regurgitation.  ?- Aortic root: The aortic root was normal in size.  ?- Mitral valve: Structurally normal valve. There was no  ???regurgitation.  ?- Right ventricle: The cavity size was normal. Wall thickness was  ???normal. Systolic function was normal.  ?- Right atrium: The atrium was normal in size.  ?- Tricuspid valve: There was mild regurgitation.  ?- Pulmonic valve: There was no regurgitation.  ?- Pulmonary arteries: Systolic pressure was within the normal  ???range.  ?- Inferior vena cava: The vessel was normal in size.  ?- Pericardium, extracardiac: There was no pericardial effusion.  ?  ?Neuro/Psych ? Headaches, PSYCHIATRIC DISORDERS Anxiety Depression   ? GI/Hepatic ?GERD  Medicated,(+)  ?  ? substance abuse ? marijuana use,   ?Endo/Other  ?negative endocrine ROSObese BMI 32 ? Renal/GU ?negative Renal ROS   ?negative genitourinary ?  ?Musculoskeletal ? ?(+) Arthritis ,  ? Abdominal ?  ?Peds ? Hematology ?negative hematology ROS ?(+)   ?Anesthesia Other Findings ? ? Reproductive/Obstetrics ? ?  ? ? ? ? ? ? ? ? ? ? ? ? ? ?  ?  ? ? ? ? ? ? ? ?Anesthesia Physical ?Anesthesia Plan ? ?ASA: 3 ? ?Anesthesia Plan: Spinal  ? ?Post-op Pain Management: Tylenol PO (pre-op)*  ? ?Induction:  ? ?PONV Risk Score and Plan: 2 and Treatment may vary due to age or medical condition, Midazolam, Propofol infusion, Dexamethasone and Ondansetron ? ?Airway Management Planned: Natural Airway ? ?Additional Equipment:  ? ?Intra-op Plan:  ? ?Post-operative Plan:  ? ?Informed Consent: I have reviewed the patients History and Physical, chart, labs and discussed the procedure including the risks, benefits and alternatives for the proposed anesthesia with the patient or authorized representative who has indicated his/her understanding and acceptance.  ? ? ? ?Dental advisory given ? ?Plan Discussed with: CRNA ? ?Anesthesia Plan Comments:   ? ? ? ? ? ? ?Anesthesia Quick Evaluation ? ?

## 2021-05-29 NOTE — Anesthesia Procedure Notes (Signed)
Spinal ? ?Patient location during procedure: OR ?Start time: 05/29/2021 12:43 PM ?End time: 05/29/2021 12:46 PM ?Staffing ?Performed: resident/CRNA  ?Anesthesiologist: Freddrick March, MD ?Resident/CRNA: Lind Covert, CRNA ?Preanesthetic Checklist ?Completed: patient identified, IV checked, site marked, risks and benefits discussed, surgical consent, monitors and equipment checked, pre-op evaluation and timeout performed ?Spinal Block ?Patient position: sitting ?Prep: DuraPrep ?Patient monitoring: heart rate, cardiac monitor, continuous pulse ox and blood pressure ?Approach: midline ?Location: L3-4 ?Injection technique: single-shot ?Needle ?Needle type: Pencan  ?Needle gauge: 24 G ?Needle length: 10 cm ?Needle insertion depth: 8 cm ?Assessment ?Sensory level: T6 ?Additional Notes ?Timeout performed. Patient in sitting position. Lidocaine 1% 1 ml for skin wheel. SAB without difficulty. Patient to supine position. ? ? ? ?

## 2021-05-29 NOTE — Anesthesia Procedure Notes (Signed)
Date/Time: 05/29/2021 12:38 PM ?Performed by: Lind Covert, CRNA ?Oxygen Delivery Method: Simple face mask ? ? ? ? ?

## 2021-05-29 NOTE — Transfer of Care (Signed)
Immediate Anesthesia Transfer of Care Note ? ?Patient: Shannon Stuart ? ?Procedure(s) Performed: LEFT TOTAL HIP ARTHROPLASTY ANTERIOR APPROACH (Left: Hip) ? ?Patient Location: PACU ? ?Anesthesia Type:Spinal ? ?Level of Consciousness: sedated ? ?Airway & Oxygen Therapy: Patient Spontanous Breathing and Patient connected to face mask oxygen ? ?Post-op Assessment: Report given to RN and Post -op Vital signs reviewed and stable ? ?Post vital signs: Reviewed and stable ? ?Last Vitals:  ?Vitals Value Taken Time  ?BP 119/81 05/29/21 1418  ?Temp    ?Pulse 95 05/29/21 1420  ?Resp 14 05/29/21 1420  ?SpO2 100 % 05/29/21 1420  ?Vitals shown include unvalidated device data. ? ?Last Pain:  ?Vitals:  ? 05/29/21 1056  ?TempSrc:   ?PainSc: 8   ?   ? ?Patients Stated Pain Goal: 7 (05/29/21 1056) ? ?Complications: No notable events documented. ?

## 2021-05-29 NOTE — Op Note (Signed)
PRE-OP DIAGNOSIS:  LEFT HIP DEGENERATIVE JOINT DISEASE ?POST-OP DIAGNOSIS: same ?PROCEDURE:  LEFT TOTAL HIP ARTHROPLASTY ANTERIOR APPROACH ?ANESTHESIA:  Spinal and MAC ?SURGEON:  Melrose Nakayama MD ?ASSISTANT:  Loni Dolly PA-C ? ? ?INDICATIONS FOR PROCEDURE:  The patient is a 61 y.o. female with a long history of a painful hip.  This has persisted despite multiple conservative measures.  The patient has persisted with pain and dysfunction making rest and activity difficult.  A total hip replacement is offered as surgical treatment.  Informed operative consent was obtained after discussion of possible complications including reaction to anesthesia, infection, neurovascular injury, dislocation, DVT, PE, and death.  The importance of the postoperative rehab program to optimize result was stressed with the patient. ? ?SUMMARY OF FINDINGS AND PROCEDURE:  Under the above anesthesia through a anterior approach an the Hana table a left THR was performed.  The patient had severe degenerative change and excellent bone quality.  We used DePuy components to replace the hip and these were size 4 Actis femur capped with a +5 97m ceramic hip ball.  On the acetabular side we used a size 52 Gription shell with a plus 4 neutral polyethylene liner.  We did use a hole eliminator.  ALoni DollyPA-C assisted throughout and was invaluable to the completion of the case in that he helped position and retract while I performed the procedure.  He also closed simultaneously to help minimize OR time.  I used fluoroscopy throughout the case to check position of components and leg lengths and read all these views myself. ? ?DESCRIPTION OF PROCEDURE:  The patient was taken to the OR suite where the above anesthetic was applied.  The patient was then positioned on the Hana table supine.  All bony prominences were appropriately padded.  Prep and drape was then performed in normal sterile fashion.  The patient was given kefzol preoperative  antibiotic and an appropriate time out was performed.  We then took an anterior approach to the left hip.  Dissection was taken through adipose to the tensor fascia lata fascia.  This structure was incised longitudinally and we dissected in the intermuscular interval just medial to this muscle.  Cobra retractors were placed superior and inferior to the femoral neck superficial to the capsule.  A capsular incision was then made and the retractors were placed along the femoral neck.  Xray was brought in to get a good level for the femoral neck cut which was made with an oscillating saw and osteotome.  The femoral head was removed with a corkscrew.  The acetabulum was exposed and some labral tissues were excised. Reaming was taken to the inside wall of the pelvis and sequentially up to 1 mm smaller than the actual component.  A trial of components was done and then the aforementioned acetabular shell was placed in appropriate tilt and anteversion confirmed by fluoroscopy. The liner was placed along with the hole eliminator and attention was turned to the femur.  The leg was brought down and over into adduction and the elevator bar was used to raise the femur up gently in the wound.  The piriformis was released with care taken to preserve the obturator internus attachment and all of the posterior capsule. The femur was reamed and then broached to the appropriate size.  A trial reduction was done and the aforementioned head and neck assembly gave uKoreathe best stability in extension with external rotation.  Leg lengths were felt to be about equal by fluoroscopic  exam.  The trial components were removed and the wound irrigated.  We then placed the femoral component in appropriate anteversion.  The head was applied to a dry stem neck and the hip again reduced.  It was again stable in the aforementioned position.  The would was irrigated again followed by re-approximation of anterior capsule with ethibond suture. Tensor  fascia was repaired with V-loc suture  followed by deep closure with #O and #2 undyed vicryl.  Skin was closed with subQ stitch and steristrips followed by a sterile dressing.  EBL and IOF can be obtained from anesthesia records. ? ?DISPOSITION:  The patient was taken to PACU to potentially go home same day depending on ability to walk and tolerate liquids.  ?

## 2021-05-30 ENCOUNTER — Other Ambulatory Visit: Payer: Self-pay

## 2021-05-30 ENCOUNTER — Encounter (HOSPITAL_COMMUNITY): Payer: Self-pay | Admitting: Orthopaedic Surgery

## 2021-05-30 DIAGNOSIS — M1612 Unilateral primary osteoarthritis, left hip: Secondary | ICD-10-CM | POA: Diagnosis not present

## 2021-05-30 LAB — BASIC METABOLIC PANEL
Anion gap: 9 (ref 5–15)
BUN: 14 mg/dL (ref 8–23)
CO2: 24 mmol/L (ref 22–32)
Calcium: 8.1 mg/dL — ABNORMAL LOW (ref 8.9–10.3)
Chloride: 102 mmol/L (ref 98–111)
Creatinine, Ser: 1.02 mg/dL — ABNORMAL HIGH (ref 0.44–1.00)
GFR, Estimated: >60 mL/min (ref >60)
Glucose, Bld: 154 mg/dL — ABNORMAL HIGH (ref 70–99)
Potassium: 3.7 mmol/L (ref 3.5–5.1)
Sodium: 135 mmol/L (ref 135–145)

## 2021-05-30 LAB — CBC
HCT: 28 % — ABNORMAL LOW (ref 36.0–46.0)
Hemoglobin: 8.8 g/dL — ABNORMAL LOW (ref 12.0–15.0)
MCH: 28.5 pg (ref 26.0–34.0)
MCHC: 31.4 g/dL (ref 30.0–36.0)
MCV: 90.6 fL (ref 80.0–100.0)
Platelets: 219 10*3/uL (ref 150–400)
RBC: 3.09 MIL/uL — ABNORMAL LOW (ref 3.87–5.11)
RDW: 16.4 % — ABNORMAL HIGH (ref 11.5–15.5)
WBC: 15.3 10*3/uL — ABNORMAL HIGH (ref 4.0–10.5)
nRBC: 0 % (ref 0.0–0.2)

## 2021-05-30 MED ORDER — ASPIRIN 81 MG PO CHEW: 81.0000 mg | CHEWABLE_TABLET | Freq: Two times a day (BID) | ORAL | 0 refills | Status: DC

## 2021-05-30 MED ORDER — OXYCODONE HCL 5 MG PO TABS: 5.0000 mg | ORAL_TABLET | Freq: Four times a day (QID) | ORAL | 0 refills | Status: DC | PRN

## 2021-05-30 MED ORDER — ASPIRIN 81 MG PO CHEW
81.0000 mg | CHEWABLE_TABLET | Freq: Two times a day (BID) | ORAL | Status: DC
  Administered 2021-05-30: 81 mg via ORAL
  Filled 2021-05-30: qty 1

## 2021-05-30 NOTE — Progress Notes (Signed)
Physical Therapy Treatment ?Patient Details ?Name: Shannon Stuart ?MRN: [id number] ?DOB: April 26, 1960 ?Today's Date: 05/30/2021 ? ? ?History of Present Illness 61 yo female s/p L THA-DA 05/29/21. Hx of multiple back sg ? ?  ?PT Comments  ? ? Progressing with mobility. Practiced stair negotiation with pt and family. Encouraged pt to ambulate every hour as tolerated. Pt reports plan is for HHPT so instructed her to defer to [facility] therapist since they will take over her care after d/c. All education completed.  ?  ?Recommendations for follow up therapy are one component of a multi-disciplinary discharge planning process, led by the attending physician.  Recommendations may be updated based on patient status, additional functional criteria and insurance authorization. ? ?Follow Up Recommendations ? Follow physician's recommendations for discharge plan and follow up therapies (plan is for HHPT) ?  ?  ?Assistance Recommended at Discharge PRN  ?Patient can return home with the following A little help with bathing/dressing/bathroom;Assistance with cooking/housework;Assist for transportation;Help with stairs or ramp for entrance ?  ?Equipment Recommendations ? Rolling walker (2 wheels)  ?  ?Recommendations for Other Services   ? ? ?  ?Precautions / Restrictions Precautions ?Precautions: Fall ?Restrictions ?Weight Bearing Restrictions: No ?LLE Weight Bearing: Weight bearing as tolerated  ?  ? ?Mobility ? Bed Mobility ?Overal bed mobility: Needs Assistance ?Bed Mobility: Supine to Sit, Sit to Supine ?  ?  ?Supine to sit: Supervision, HOB elevated ?Sit to supine: Supervision, HOB elevated ?  ?General bed mobility comments: Cues for safety, technique. Pt was able to use gait belt as leg lifter to assist L LE on/off bed. ?  ? ?Transfers ?Overall transfer level: Needs assistance ?Equipment used: Rolling walker (2 wheels) ?Transfers: Sit to/from Stand ?Sit to Stand: Supervision ?  ?  ?  ?  ?  ?General transfer comment: Min guard for safety.  Cues for safety, hand/LE placement. ?  ? ?Ambulation/Gait ?Ambulation/Gait assistance: Min guard ?Gait Distance (Feet): 75 Feet ?Assistive device: Rolling walker (2 wheels) ?Gait Pattern/deviations: Decreased step length - left, Decreased stride length ?  ?  ?  ?General Gait Details: Cues for safety, sequencing, proper RW use. As distance increaesed, pt was able to progress to step thru pattern. ? ? ?Stairs ?Stairs: Yes ?Min Assist  ?Stair Management: Forwards, With walker, Step to pattern ?Number of Stairs: 5 ?General stair comments: Up and over portable stairs x 2. Cues for safety, technique, sequence. Family present to observe and practice with pt. ? ? ?Wheelchair Mobility ?  ? ?Modified Rankin (Stroke Patients Only) ?  ? ? ?  ?Balance Overall balance assessment: Needs assistance ?  ?  ?  ?  ?Standing balance support: Bilateral upper extremity supported, During functional activity, Reliant on assistive device for balance ?Standing balance-Leahy Scale: Fair ?  ?  ?  ?  ?  ?  ?  ?  ?  ?  ?  ?  ?  ? ?  ?Cognition Arousal/Alertness: Awake/alert ?Behavior During Therapy: [facility] for tasks assessed/performed ?Overall Cognitive Status: Within Functional Limits for tasks assessed ?  ?  ?  ?  ?  ?  ?  ?  ?  ?  ?  ?  ?  ?  ?  ?  ?  ?  ?  ? ?  ?Exercises Total Joint Exercises ?Ankle Circles/Pumps: AROM, Both, 10 reps ?Quad Sets: AROM, Both, 10 reps ?Heel Slides: AAROM, Left, 10 reps ?Hip ABduction/ADduction: AAROM, Left, 10 reps ? ?  ?General Comments   ?  ?  ? ?  Pertinent Vitals/Pain Pain Assessment ?Pain Assessment: 0-10 ?Pain Score: 8  ?Pain Location: L hip/thigh/groin ?Pain Descriptors / Indicators: Discomfort, Sore, Grimacing, Guarding ?Pain Intervention(s): Limited activity within patient's tolerance, Monitored during session, Repositioned  ? ? ?Home Living Family/patient expects to be discharged to:: Private residence ?Living Arrangements: Spouse/significant other ?Available Help at Discharge: Family ?Type of Home:  Apartment ?Home Access: Stairs to enter ?Entrance Stairs-Rails: None ?Entrance Stairs-Number of Steps: 2 ?  ?Home Layout: One level ?Home Equipment: None ?   ?  ?Prior Function    ?  ?  ?   ? ?PT Goals (current goals can now be found in the care plan section) Acute Rehab PT Goals ?Patient Stated Goal: less pain. wants to d/c home today ?PT Goal Formulation: With patient ?Time For Goal Achievement: 06/13/21 ?Potential to Achieve Goals: Good ?Progress towards PT goals: Progressing toward goals ? ?  ?Frequency ? ? ? 7X/week ? ? ? ?  ?PT Plan Current plan remains appropriate  ? ? ?Co-evaluation   ?  ?  ?  ?  ? ?  ?AM-PAC PT "6 Clicks" Mobility   ?Outcome Measure ? Help needed turning from your back to your side while in a flat bed without using bedrails?: A Little ?Help needed moving from lying on your back to sitting on the side of a flat bed without using bedrails?: A Little ?Help needed moving to and from a bed to a chair (including a wheelchair)?: A Little ?Help needed standing up from a chair using your arms (e.g., wheelchair or bedside chair)?: A Little ?Help needed to walk in hospital room?: A Little ?Help needed climbing 3-5 steps with a railing? : A Little ?6 Click Score: 18 ? ?  ?End of Session Equipment Utilized During Treatment: Gait belt ?Activity Tolerance: Patient tolerated treatment well ?Patient left: in bed;with call bell/phone within reach;with family/visitor present ?  ?PT Visit Diagnosis: Other abnormalities of gait and mobility (R26.89) ?  ? ? ?Time: 1062-6948 ?PT Time Calculation (min) (ACUTE ONLY): 10 min ? ?Charges:  $Gait Training: 8-22 mins          ?          ? ? ? ? ?Doreatha Massed, PT ?Acute Rehabilitation  ?Office: [phone] ?Pager: [phone] ? ?  ? ?

## 2021-05-30 NOTE — Progress Notes (Signed)
Subjective: ?1 Day Post-Op Procedure(s) (LRB): ?LEFT TOTAL HIP ARTHROPLASTY ANTERIOR APPROACH (Left) ? ?Patient doing better this morning. She did have some increased pain last night. She is hoping to go home today. ? ?Activity level:  wbat ?Diet tolerance:  ok ?Voiding:  ok ?Patient reports pain as mild.   ? ?Objective: ?Vital signs in last 24 hours: ?Temp:  [97.6 ?F (36.4 ?C)-98.7 ?F (37.1 ?C)] 98.7 ?F (37.1 ?C) (03/08 0544) ?Pulse Rate:  [58-106] 104 (03/08 0544) ?Resp:  [10-22] 18 (03/08 0544) ?BP: (115-160)/(72-119) 139/98 (03/08 0544) ?SpO2:  [95 %-100 %] 98 % (03/08 0544) ?Weight:  [95.7 kg-98.7 kg] 98.7 kg (03/07 1823) ? ?Labs: ?Recent Labs  ?  05/30/21 ?0318  ?HGB 8.8*  ? ?Recent Labs  ?  05/30/21 ?0318  ?WBC 15.3*  ?RBC 3.09*  ?HCT 28.0*  ?PLT 219  ? ?Recent Labs  ?  05/30/21 ?0318  ?NA 135  ?K 3.7  ?CL 102  ?CO2 24  ?BUN 14  ?CREATININE 1.02*  ?GLUCOSE 154*  ?CALCIUM 8.1*  ? ?No results for input(s): LABPT, INR in the last 72 hours. ? ?Physical Exam: ? Neurologically intact ?ABD soft ?Neurovascular intact ?Sensation intact distally ?Intact pulses distally ?Dorsiflexion/Plantar flexion intact ?Incision: dressing C/D/I and no drainage ?No cellulitis present ?Compartment soft ? ?Assessment/Plan: ? ?1 Day Post-Op Procedure(s) (LRB): ?LEFT TOTAL HIP ARTHROPLASTY ANTERIOR APPROACH (Left) ?Advance diet ?Up with therapy ?D/C IV fluids ?Discharge home with home health today if cleared by PT and doing well ?Continue on '81mg'$  asa BID for DVT prevention. ?Follow up in office 2 weeks post op. ? ? ? ?Larwance Sachs Lavar Rosenzweig ?05/30/2021, 8:12 AM ? ?

## 2021-05-30 NOTE — TOC Transition Note (Signed)
Transition of Care (TOC) - CM/SW Discharge Note ? ? ?Patient Details  ?Name: Shannon Stuart ?MRN: [id number] ?Date of Birth: 1960/09/26 ? ?Transition of Care (TOC) CM/SW Contact:  ?Marti Acebo, LCSW ?Phone Number: ?05/30/2021, 10:07 AM ? ? ?Clinical Narrative:    ?Met with pt and confirming rolling walker prearranged for delivery to room by [employer].  HHPT prearranged with [facility].  No TOC needs. ? ? ?Final next level of care: [place] ?Barriers to Discharge: No Barriers Identified ? ? ?Patient Goals and CMS Choice ?Patient states their goals for this hospitalization and ongoing recovery are:: return home ?  ?  ? ?Discharge Placement ?  ?           ?  ?  ?  ?  ? ?Discharge Plan and Services ?  ?  ?           ?DME Arranged: Walker rolling ?DME Agency: [employer] ?  ?  ?  ?HH Arranged: PT ?[place] Agency: [place] ?  ?  ?  ? ?Social Determinants of Health (SDOH) Interventions ?  ? ? ?Readmission Risk Interventions ?No flowsheet data found. ? ? ? ? ?

## 2021-05-30 NOTE — Anesthesia Postprocedure Evaluation (Signed)
Anesthesia Post Note ? ?Patient: Shannon Stuart ? ?Procedure(s) Performed: LEFT TOTAL HIP ARTHROPLASTY ANTERIOR APPROACH (Left: Hip) ? ?  ? ?Patient location during evaluation: PACU ?Anesthesia Type: Spinal ?Level of consciousness: oriented and awake and alert ?Pain management: pain level controlled ?Vital Signs Assessment: post-procedure vital signs reviewed and stable ?Respiratory status: spontaneous breathing, respiratory function stable and patient connected to nasal cannula oxygen ?Cardiovascular status: blood pressure returned to baseline and stable ?Postop Assessment: no headache, no backache and no apparent nausea or vomiting ?Anesthetic complications: no ? ? ?No notable events documented. ? ?Last Vitals:  ?Vitals:  ? 05/30/21 0957 05/30/21 1239  ?BP: (!) 136/96 (!) 129/92  ?Pulse: 90 97  ?Resp: 16 16  ?Temp: 36.9 ?C 36.7 ?C  ?SpO2: 98% 97%  ?  ?Last Pain:  ?Vitals:  ? 05/30/21 1239  ?TempSrc: Oral  ?PainSc: 5   ? ? ?  ?  ?  ?  ?  ?  ? ?Avriana Joo L Tarius Stangelo ? ? ? ? ?
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
## 2021-05-30 NOTE — Plan of Care (Signed)
Plan of care reviewed and discussed with the patient. 

## 2021-05-30 NOTE — Progress Notes (Signed)
Provided discharge education/instructions, all questions and concerns addressed, RW delivered to room. Pt not in acute distress, discharged home with belongings accompanied by family. ?

## 2021-05-30 NOTE — Evaluation (Signed)
Physical Therapy Evaluation ?Patient Details ?Name: Shannon Stuart ?MRN: [id number] ?DOB: 19-Feb-1961 ?Today's Date: 05/30/2021 ? ?History of Present Illness ? 61 yo female s/p L THA-DA 05/29/21. Hx of multiple back sg  ?Clinical Impression ? On eval, pt was Min guard assist for mobility. She walked ~135 feet with a RW. Pain rated 8/10. Will plan to have a 2nd session to practice stair negotiation. Pt is eager to d/c home today.    ?   ? ?Recommendations for follow up therapy are one component of a multi-disciplinary discharge planning process, led by the attending physician.  Recommendations may be updated based on patient status, additional functional criteria and insurance authorization. ? ?Follow Up Recommendations Follow physician's recommendations for discharge plan and follow up therapies ? ?  ?Assistance Recommended at Discharge PRN  ?Patient can return home with the following ? A little help with walking and/or transfers;A little help with bathing/dressing/bathroom;Assistance with cooking/housework;Assist for transportation;Help with stairs or ramp for entrance ? ?  ?Equipment Recommendations Rolling walker (2 wheels)  ?Recommendations for Other Services ?    ?  ?Functional Status Assessment Patient has had a recent decline in their functional status and demonstrates the ability to make significant improvements in function in a reasonable and predictable amount of time.  ? ?  ?Precautions / Restrictions Precautions ?Precautions: Fall ?Restrictions ?Weight Bearing Restrictions: No ?LLE Weight Bearing: Weight bearing as tolerated  ? ?  ? ?Mobility ? Bed Mobility ?Overal bed mobility: Needs Assistance ?Bed Mobility: Supine to Sit, Sit to Supine ?  ?  ?Supine to sit: Supervision, HOB elevated ?Sit to supine: Supervision, HOB elevated ?  ?General bed mobility comments: Cues for safety, technique. Pt was able to use gait belt as leg lifter to assist L LE on/off bed. ?  ? ?Transfers ?Overall transfer level: Needs  assistance ?Equipment used: Rolling walker (2 wheels) ?Transfers: Sit to/from Stand ?Sit to Stand: Min guard, From elevated surface ?  ?  ?  ?  ?  ?General transfer comment: Min guard for safety. Cues for safety, hand/LE placement. ?  ? ?Ambulation/Gait ?Ambulation/Gait assistance: Min guard ?Gait Distance (Feet): 135 Feet ?Assistive device: Rolling walker (2 wheels) ?Gait Pattern/deviations: Decreased step length - left, Decreased stride length ?  ?  ?  ?General Gait Details: Cues for safety, sequencing, proper RW use. As distance increaesed, pt was able to progress to step thru pattern. Pt denied dizziness but she did report some nausea. ? ?Stairs ?  ?  ?  ?  ?  ? ?Wheelchair Mobility ?  ? ?Modified Rankin (Stroke Patients Only) ?  ? ?  ? ?Balance Overall balance assessment: Needs assistance ?  ?  ?  ?  ?Standing balance support: Bilateral upper extremity supported, During functional activity, Reliant on assistive device for balance ?Standing balance-Leahy Scale: Fair ?  ?  ?  ?  ?  ?  ?  ?  ?  ?  ?  ?  ?   ? ? ? ?Pertinent Vitals/Pain Pain Assessment ?Pain Assessment: 0-10 ?Pain Score: 8  ?Pain Location: L hip/thigh/groin ?Pain Descriptors / Indicators: Discomfort, Sore, Grimacing, Guarding ?Pain Intervention(s): Limited activity within patient's tolerance, Monitored during session, Ice applied, Repositioned  ? ? ?Home Living Family/patient expects to be discharged to:: Private residence ?Living Arrangements: Spouse/significant other ?Available Help at Discharge: Family ?Type of Home: Apartment ?Home Access: Stairs to enter ?Entrance Stairs-Rails: None ?Entrance Stairs-Number of Steps: 2 ?  ?Home Layout: One level ?Home Equipment: None ?   ?  ?  Prior Function Prior Level of Function : Independent/Modified Independent ?  ?  ?  ?  ?  ?  ?  ?  ?  ? ? ?Hand Dominance  ?   ? ?  ?Extremity/Trunk Assessment  ? Upper Extremity Assessment ?Upper Extremity Assessment: Overall WFL for tasks assessed ?  ? ?Lower Extremity  Assessment ?Lower Extremity Assessment: Generalized weakness ?  ? ?Cervical / Trunk Assessment ?Cervical / Trunk Assessment: Normal  ?Communication  ? Communication: No difficulties  ?Cognition Arousal/Alertness: Awake/alert ?Behavior During Therapy: [facility] for tasks assessed/performed ?Overall Cognitive Status: Within Functional Limits for tasks assessed ?  ?  ?  ?  ?  ?  ?  ?  ?  ?  ?  ?  ?  ?  ?  ?  ?  ?  ?  ? ?  ?General Comments   ? ?  ?Exercises Total Joint Exercises ?Ankle Circles/Pumps: AROM, Both, 10 reps ?Quad Sets: AROM, Both, 10 reps ?Heel Slides: AAROM, Left, 10 reps ?Hip ABduction/ADduction: AAROM, Left, 10 reps  ? ?Assessment/Plan  ?  ?PT Assessment Patient needs continued PT services  ?PT Problem List Decreased strength;Decreased mobility;Decreased range of motion;Decreased activity tolerance;Decreased balance;Decreased knowledge of use of DME;Pain ? ?   ?  ?PT Treatment Interventions DME instruction;Gait training;Therapeutic exercise;Balance training;Stair training;Functional mobility training;Therapeutic activities;Patient/family education   ? ?PT Goals (Current goals can be found in the Care Plan section)  ?Acute Rehab PT Goals ?Patient Stated Goal: less pain. wants to d/c home today ?PT Goal Formulation: With patient ?Time For Goal Achievement: 06/13/21 ?Potential to Achieve Goals: Good ? ?  ?Frequency 7X/week ?  ? ? ?Co-evaluation   ?  ?  ?  ?  ? ? ?  ?AM-PAC PT "6 Clicks" Mobility  ?Outcome Measure Help needed turning from your back to your side while in a flat bed without using bedrails?: A Little ?Help needed moving from lying on your back to sitting on the side of a flat bed without using bedrails?: A Little ?Help needed moving to and from a bed to a chair (including a wheelchair)?: A Little ?Help needed standing up from a chair using your arms (e.g., wheelchair or bedside chair)?: A Little ?Help needed to walk in hospital room?: A Little ?Help needed climbing 3-5 steps with a railing? : A  Little ?6 Click Score: 18 ? ?  ?End of Session Equipment Utilized During Treatment: Gait belt ?Activity Tolerance: Patient tolerated treatment well ?Patient left: in bed;with call bell/phone within reach;with family/visitor present ?  ?PT Visit Diagnosis: Other abnormalities of gait and mobility (R26.89) ?  ? ?Time: 0174-9449 ?PT Time Calculation (min) (ACUTE ONLY): 23 min ? ? ?Charges:   PT Evaluation ?$PT Eval Low Complexity: 1 Low ?PT Treatments ?$Gait Training: 8-22 mins ?  ?   ? ? ? ?Doreatha Massed, PT ?Acute Rehabilitation  ?Office: [phone] ?Pager: [phone] ? ?  ? ?

## 2021-05-30 NOTE — Plan of Care (Signed)
?  Problem: Health Behavior/Discharge Planning: Goal: Ability to manage health-related needs will improve Outcome: Progressing   Problem: Clinical Measurements: Goal: Ability to maintain clinical measurements within normal limits will improve Outcome: Progressing   Problem: Activity: Goal: Risk for activity intolerance will decrease Outcome: Progressing   Problem: Coping: Goal: Level of anxiety will decrease Outcome: Progressing   Problem: Pain Managment: Goal: General experience of comfort will improve Outcome: Progressing   Problem: Safety: Goal: Ability to remain free from injury will improve Outcome: Progressing   Problem: Skin Integrity: Goal: Risk for impaired skin integrity will decrease Outcome: Progressing   

## 2021-06-08 ENCOUNTER — Other Ambulatory Visit (HOSPITAL_COMMUNITY): Payer: Self-pay | Admitting: Orthopaedic Surgery

## 2021-06-08 ENCOUNTER — Ambulatory Visit (HOSPITAL_COMMUNITY)
Admission: RE | Admit: 2021-06-08 | Discharge: 2021-06-08 | Disposition: A | Discharge status: Home / Self Care | Payer: Medicare HMO | Source: Ambulatory Visit | Attending: Orthopaedic Surgery | Admitting: Orthopaedic Surgery

## 2021-06-08 ENCOUNTER — Other Ambulatory Visit: Payer: Self-pay

## 2021-06-08 DIAGNOSIS — M79662 Pain in left lower leg: Secondary | ICD-10-CM | POA: Insufficient documentation

## 2021-06-08 DIAGNOSIS — M7989 Other specified soft tissue disorders: Secondary | ICD-10-CM

## 2021-06-13 ENCOUNTER — Encounter (HOSPITAL_COMMUNITY): Payer: Medicare HMO

## 2021-06-16 ENCOUNTER — Other Ambulatory Visit: Payer: Self-pay

## 2021-06-16 ENCOUNTER — Encounter (HOSPITAL_BASED_OUTPATIENT_CLINIC_OR_DEPARTMENT_OTHER): Payer: Self-pay | Admitting: Emergency Medicine

## 2021-06-16 ENCOUNTER — Emergency Department (HOSPITAL_BASED_OUTPATIENT_CLINIC_OR_DEPARTMENT_OTHER)
Admission: EM | Admit: 2021-06-16 | Discharge: 2021-06-17 | Disposition: A | Discharge status: Home / Self Care | Payer: Medicare HMO | Attending: Emergency Medicine | Admitting: Emergency Medicine

## 2021-06-16 DIAGNOSIS — I10 Essential (primary) hypertension: Secondary | ICD-10-CM | POA: Insufficient documentation

## 2021-06-16 DIAGNOSIS — X58XXXA Exposure to other specified factors, initial encounter: Secondary | ICD-10-CM | POA: Diagnosis not present

## 2021-06-16 DIAGNOSIS — Z7982 Long term (current) use of aspirin: Secondary | ICD-10-CM | POA: Insufficient documentation

## 2021-06-16 DIAGNOSIS — Z79899 Other long term (current) drug therapy: Secondary | ICD-10-CM | POA: Diagnosis not present

## 2021-06-16 DIAGNOSIS — S7002XA Contusion of left hip, initial encounter: Secondary | ICD-10-CM | POA: Diagnosis not present

## 2021-06-16 DIAGNOSIS — S79912A Unspecified injury of left hip, initial encounter: Secondary | ICD-10-CM | POA: Diagnosis present

## 2021-06-16 DIAGNOSIS — M96830 Postprocedural hemorrhage and hematoma of a musculoskeletal structure following a musculoskeletal system procedure: Secondary | ICD-10-CM | POA: Insufficient documentation

## 2021-06-16 DIAGNOSIS — Z96642 Presence of left artificial hip joint: Secondary | ICD-10-CM | POA: Diagnosis not present

## 2021-06-16 DIAGNOSIS — D649 Anemia, unspecified: Secondary | ICD-10-CM | POA: Insufficient documentation

## 2021-06-16 LAB — CBC WITH DIFFERENTIAL/PLATELET
Abs Immature Granulocytes: 0.03 10*3/uL (ref 0.00–0.07)
Basophils Absolute: 0.1 10*3/uL (ref 0.0–0.1)
Basophils Relative: 1 %
Eosinophils Absolute: 0.2 10*3/uL (ref 0.0–0.5)
Eosinophils Relative: 2 %
HCT: 27.3 % — ABNORMAL LOW (ref 36.0–46.0)
Hemoglobin: 8.3 g/dL — ABNORMAL LOW (ref 12.0–15.0)
Immature Granulocytes: 0 %
Lymphocytes Relative: 28 %
Lymphs Abs: 2.7 10*3/uL (ref 0.7–4.0)
MCH: 28.9 pg (ref 26.0–34.0)
MCHC: 30.4 g/dL (ref 30.0–36.0)
MCV: 95.1 fL (ref 80.0–100.0)
Monocytes Absolute: 0.7 10*3/uL (ref 0.1–1.0)
Monocytes Relative: 7 %
Neutro Abs: 5.8 10*3/uL (ref 1.7–7.7)
Neutrophils Relative %: 62 %
Platelets: 291 10*3/uL (ref 150–400)
RBC: 2.87 MIL/uL — ABNORMAL LOW (ref 3.87–5.11)
RDW: 19.6 % — ABNORMAL HIGH (ref 11.5–15.5)
WBC: 9.5 10*3/uL (ref 4.0–10.5)
nRBC: 0.3 % — ABNORMAL HIGH (ref 0.0–0.2)

## 2021-06-16 NOTE — ED Notes (Addendum)
Surgical incision is draining sanguinous dark red drainage. Soaking 3 4x4 pads in an hour. No foul odor observed. Sight does not appear red and there is old bruising noted around the site. Site feels warm and is very tender around incision. Pt has significant bilateral edema to both lower extremities. Pedal pulses intact.  ?

## 2021-06-16 NOTE — ED Triage Notes (Signed)
Reports having left hip replacement 05/29/21.  Noticed bleeding from the surgical site today.  Also reports increased pain. ?

## 2021-06-16 NOTE — ED Provider Notes (Signed)
?[place] EM[facility]ENC[facility] DEPARTMENT ?Provider Note ? ? ?CSN: [id number] ?Arrival date & time: 06/16/21  2219 ? ?  ? ?History ? ?Chief Complaint  ?Patient presents with  ? Post-op Problem  ? ? ?Shannon Stuart is a 61 y.o. female. ? ?The history is provided by the patient.  ?She has history of hypertension, hyperlipidemia, kappa light chain disease and comes in complaining of bleeding from surgical incision.  She had a left total hip arthroplasty on 05/29/2021.  She has been having fairly constant pain in the surgical site since then and has noted some swelling.  Today, she started having blood draining.  She has been unable to stop the bleeding in spite of applying pressure.  There has been no change in the pain that she has had since the surgery.  She denies any fever or chills but has noted that the area is warm to the touch.  She is on low-dose aspirin but not on any other anticoagulants or antiplatelet agents. ?  ?Home Medications ?Prior to Admission medications   ?Medication Sig Start Date End Date Taking? Authorizing Provider  ?ALPRAZolam (XANAX) 1 MG tablet TAKE 1 TABLET BY MOUTH 3 TIMES DAILY. 12/06/20   Zenia Resides, MD  ?amLODipine (NORVASC) 5 MG tablet Take 1 tablet (5 mg total) by mouth daily. 11/29/20   Zenia Resides, MD  ?aspirin 81 MG chewable tablet Chew 1 tablet (81 mg total) by mouth 2 (two) times daily. For 2 weeks then once a day for 2 weeks for blood clot prevention. 05/30/21   Loni Dolly, PA-C  ?Cholecalciferol (VITAMIN D) 125 MCG (5000 UT) CAPS Take 5,000 Units by mouth daily.    [provider]  ?cyclobenzaprine (FLEXERIL) 10 MG tablet Take 1 tablet (10 mg total) by mouth 3 (three) times daily. 01/01/21   Zenia Resides, MD  ?diclofenac Sodium (VOLTAREN) 1 % GEL APPLY 2 GRAMS TOPICALLY FOUR TIMES DAILY ?Patient taking differently: 2 g daily as needed (pain). 09/08/20   Zenia Resides, MD  ?lisinopril (ZESTRIL) 40 MG tablet Take 1 tablet (40 mg total) by mouth daily.  11/29/20   Zenia Resides, MD  ?Multiple Vitamins-Minerals (MULTIVITAMIN WITH MINERALS) tablet Take 1 tablet by mouth daily.    [provider]  ?Omega-3 Fatty Acids (FISH OIL) 1000 MG CAPS Take 1,000 mg by mouth daily.     [provider]  ?oxyCODONE (OXY IR/ROXICODONE) 5 MG immediate release tablet Take 1 tablet (5 mg total) by mouth every 6 (six) hours as needed for severe pain or breakthrough pain (in addition fo baseline pain meds if needed.). 05/30/21   Loni Dolly, PA-C  ?oxyCODONE-acetaminophen (PERCOCET) 10-325 MG tablet Take 1 tablet by mouth 3 (three) times daily. 04/27/21   [provider]  ?pantoprazole (PROTONIX) 40 MG tablet Take 1 tablet (40 mg total) by mouth daily before breakfast. 02/09/21   Esterwood, Amy S, PA-C  ?potassium chloride SA (KLOR-CON) 20 MEQ tablet Take 1 tablet (20 mEq total) by mouth daily. 08/16/20   Drenda Freeze, MD  ?promethazine (PHENERGAN) 25 MG suppository Place 1 suppository (25 mg total) rectally every 6 (six) hours as needed for nausea or vomiting. 08/15/20   Zenia Resides, MD  ?rosuvastatin (CRESTOR) 20 MG tablet TAKE 1 TABLET EVERY DAY 12/28/20   Zenia Resides, MD  ?traZODone (DESYREL) 100 MG tablet Take 1 tablet (100 mg total) by mouth at bedtime. 01/01/21   Zenia Resides, MD  ?vitamin B-12 (CYANOCOBALAMIN) 100  MCG tablet Take 100 mcg by mouth once a week.    [provider]  ?   ? ?Allergies    ?Morphine and related and Zofran [ondansetron hcl]   ? ?Review of Systems   ?Review of Systems  ?All other systems reviewed and are negative. ? ?Physical Exam ?Updated Vital Signs ?BP (!) 144/99 (BP Location: Left Arm)   Pulse 85   Temp 99 ?F (37.2 ?C) (Oral)   Resp 18   Ht '5\' 8"'$  (1.727 m)   Wt 93 kg   LMP 09/20/2011   SpO2 98%   BMI 31.17 kg/m?  ?Physical Exam ?Vitals and nursing note reviewed.  ?61 year old female, resting comfortably and in no acute distress. Vital signs are significant for elevated blood pressure.  Oxygen saturation is 98%, which is normal. ?Head is normocephalic and atraumatic. PERRLA, EOMI. Oropharynx is clear. ?Neck is nontender and supple without adenopathy or JVD. ?Back is nontender and there is no CVA tenderness. ?Lungs are clear without rales, wheezes, or rhonchi. ?Chest is nontender. ?Heart has regular rate and rhythm without murmur. ?Abdomen is soft, flat, nontender. ?Extremities: Surgical scar lateral aspect of left hip is well closed without any erythema.  Some dark blood is noted to be draining from the incision.  There is some soft tissue swelling anterior and lateral to the left hip with fluctuance suggesting a hematoma.  She does have pain with passive range of motion of the left hip but has good range of motion present. ?Skin is warm and dry without rash. ?Neurologic: Mental status is normal, cranial nerves are intact, moves all extremities equally. ? ?ED Results / Procedures / Treatments   ?Labs ?(all labs ordered are listed, but only abnormal results are displayed) ?Labs Reviewed  ?CBC WITH DIFFERENTIAL/PLATELET - Abnormal; Notable for the following components:  ?    Result Value  ? RBC 2.87 (*)   ? Hemoglobin 8.3 (*)   ? HCT 27.3 (*)   ? RDW 19.6 (*)   ? nRBC 0.3 (*)   ? All other components within normal limits  ? ? ?Procedures ?Procedures  ? ? ?Medications Ordered in ED ?Medications - No data to display ? ?ED Course/ Medical Decision Making/ A&P ?  ?                        ?Medical Decision Making ?Amount and/or Complexity of Data Reviewed ?Labs: ordered. ? ? ?Drainage from surgical wound appears to be from a postoperative hematoma.  No fever or erythema or other signs of infection.  Old records are reviewed confirming left total hip arthroplasty on 05/29/2021.  Will check CBC to evaluate hemoglobin and WBC. ? ?WBC is normal with normal differential.  No evidence of infection.  Hemoglobin is not significantly changed from postoperative value, no evidence of ongoing bleeding.  She will be  discharged and referred back to her orthopedic surgeon for decision whether to drain the hematoma.  Return precautions are discussed. ? ?Final Clinical Impression(s) / ED Diagnoses ?Final diagnoses:  ?Hematoma of left hip, initial encounter  ?Normochromic normocytic anemia  ? ? ?Rx / DC Orders ?ED Discharge Orders   ? ? None  ? ?  ? ? ?  ?Delora Fuel, MD ?[id number]/67 [id number] ? ?

## 2021-06-17 NOTE — Discharge Instructions (Signed)
Please use warm compresses and warm soaks.  Continue taking your Percocet as needed for pain. ? ?Follow-up with your orthopedic surgeon as soon as possible to see if you would still try to drain the hematoma. ? ?Return immediately if you start running a fever or if the drainage started smelling bad. ?

## 2021-07-01 ENCOUNTER — Encounter (HOSPITAL_BASED_OUTPATIENT_CLINIC_OR_DEPARTMENT_OTHER): Payer: Self-pay | Admitting: Emergency Medicine

## 2021-07-01 ENCOUNTER — Emergency Department (HOSPITAL_BASED_OUTPATIENT_CLINIC_OR_DEPARTMENT_OTHER): Payer: Medicare HMO

## 2021-07-01 ENCOUNTER — Other Ambulatory Visit: Payer: Self-pay

## 2021-07-01 ENCOUNTER — Emergency Department (HOSPITAL_BASED_OUTPATIENT_CLINIC_OR_DEPARTMENT_OTHER)
Admission: EM | Admit: 2021-07-01 | Discharge: 2021-07-01 | Disposition: A | Discharge status: Home / Self Care | Payer: Medicare HMO | Attending: Emergency Medicine | Admitting: Emergency Medicine

## 2021-07-01 DIAGNOSIS — Z7982 Long term (current) use of aspirin: Secondary | ICD-10-CM | POA: Insufficient documentation

## 2021-07-01 DIAGNOSIS — Z20822 Contact with and (suspected) exposure to covid-19: Secondary | ICD-10-CM | POA: Diagnosis not present

## 2021-07-01 DIAGNOSIS — R1115 Cyclical vomiting syndrome unrelated to migraine: Secondary | ICD-10-CM

## 2021-07-01 DIAGNOSIS — R112 Nausea with vomiting, unspecified: Secondary | ICD-10-CM | POA: Diagnosis present

## 2021-07-01 DIAGNOSIS — R197 Diarrhea, unspecified: Secondary | ICD-10-CM | POA: Diagnosis not present

## 2021-07-01 DIAGNOSIS — R000 Tachycardia, unspecified: Secondary | ICD-10-CM | POA: Diagnosis not present

## 2021-07-01 DIAGNOSIS — R1033 Periumbilical pain: Secondary | ICD-10-CM | POA: Insufficient documentation

## 2021-07-01 DIAGNOSIS — Z79899 Other long term (current) drug therapy: Secondary | ICD-10-CM | POA: Insufficient documentation

## 2021-07-01 DIAGNOSIS — I10 Essential (primary) hypertension: Secondary | ICD-10-CM | POA: Insufficient documentation

## 2021-07-01 DIAGNOSIS — R058 Other specified cough: Secondary | ICD-10-CM | POA: Insufficient documentation

## 2021-07-01 DIAGNOSIS — R1084 Generalized abdominal pain: Secondary | ICD-10-CM | POA: Diagnosis not present

## 2021-07-01 LAB — RESP PANEL BY RT-PCR (FLU A&B, COVID) ARPGX2
Influenza A by PCR: NEGATIVE
Influenza B by PCR: NEGATIVE
SARS Coronavirus 2 by RT PCR: NEGATIVE

## 2021-07-01 LAB — COMPREHENSIVE METABOLIC PANEL
ALT: 21 U/L (ref 0–44)
AST: 32 U/L (ref 15–41)
Albumin: 4.8 g/dL (ref 3.5–5.0)
Alkaline Phosphatase: 107 U/L (ref 38–126)
Anion gap: 12 (ref 5–15)
BUN: 14 mg/dL (ref 8–23)
CO2: 24 mmol/L (ref 22–32)
Calcium: 9.4 mg/dL (ref 8.9–10.3)
Chloride: 103 mmol/L (ref 98–111)
Creatinine, Ser: 1 mg/dL (ref 0.44–1.00)
GFR, Estimated: >60 mL/min (ref >60)
Glucose, Bld: 140 mg/dL — ABNORMAL HIGH (ref 70–99)
Potassium: 3.7 mmol/L (ref 3.5–5.1)
Sodium: 139 mmol/L (ref 135–145)
Total Bilirubin: 0.9 mg/dL (ref 0.3–1.2)
Total Protein: 8.7 g/dL — ABNORMAL HIGH (ref 6.5–8.1)

## 2021-07-01 LAB — URINALYSIS, ROUTINE W REFLEX MICROSCOPIC
Bilirubin Urine: NEGATIVE
Glucose, UA: NEGATIVE mg/dL
Ketones, ur: NEGATIVE mg/dL
Leukocytes,Ua: NEGATIVE
Nitrite: NEGATIVE
Protein, ur: NEGATIVE mg/dL
Specific Gravity, Urine: 1.02 (ref 1.005–1.030)
pH: 8 (ref 5.0–8.0)

## 2021-07-01 LAB — CBC WITH DIFFERENTIAL/PLATELET
Abs Immature Granulocytes: 0.05 10*3/uL (ref 0.00–0.07)
Basophils Absolute: 0 10*3/uL (ref 0.0–0.1)
Basophils Relative: 1 %
Eosinophils Absolute: 0 10*3/uL (ref 0.0–0.5)
Eosinophils Relative: 0 %
HCT: 37.9 % (ref 36.0–46.0)
Hemoglobin: 11.9 g/dL — ABNORMAL LOW (ref 12.0–15.0)
Immature Granulocytes: 1 %
Lymphocytes Relative: 20 %
Lymphs Abs: 1.7 10*3/uL (ref 0.7–4.0)
MCH: 28.3 pg (ref 26.0–34.0)
MCHC: 31.4 g/dL (ref 30.0–36.0)
MCV: 90 fL (ref 80.0–100.0)
Monocytes Absolute: 0.3 10*3/uL (ref 0.1–1.0)
Monocytes Relative: 4 %
Neutro Abs: 6.6 10*3/uL (ref 1.7–7.7)
Neutrophils Relative %: 74 %
Platelets: 291 10*3/uL (ref 150–400)
RBC: 4.21 MIL/uL (ref 3.87–5.11)
RDW: 17.6 % — ABNORMAL HIGH (ref 11.5–15.5)
WBC: 8.8 10*3/uL (ref 4.0–10.5)
nRBC: 0 % (ref 0.0–0.2)

## 2021-07-01 LAB — URINALYSIS, MICROSCOPIC (REFLEX)

## 2021-07-01 LAB — LIPASE, BLOOD: Lipase: 28 U/L (ref 11–51)

## 2021-07-01 MED ORDER — HYDROMORPHONE HCL 1 MG/ML IJ SOLN
1.0000 mg | Freq: Once | INTRAMUSCULAR | Status: AC
  Administered 2021-07-01: 1 mg via INTRAVENOUS
  Filled 2021-07-01: qty 1

## 2021-07-01 MED ORDER — PROCHLORPERAZINE EDISYLATE 10 MG/2ML IJ SOLN
10.0000 mg | INTRAMUSCULAR | Status: DC | PRN
  Administered 2021-07-01: 10 mg via INTRAVENOUS
  Filled 2021-07-01: qty 2

## 2021-07-01 MED ORDER — LORAZEPAM 1 MG PO TABS
1.0000 mg | ORAL_TABLET | Freq: Once | ORAL | Status: AC
Start: 2021-07-01 — End: 2021-07-01
  Administered 2021-07-01: 1 mg via ORAL
  Filled 2021-07-01: qty 1

## 2021-07-01 MED ORDER — PROCHLORPERAZINE 25 MG RE SUPP: 25.0000 mg | Freq: Three times a day (TID) | RECTAL | 0 refills | Status: DC | PRN

## 2021-07-01 MED ORDER — SODIUM CHLORIDE 0.9 % IV BOLUS
1000.0000 mL | Freq: Once | INTRAVENOUS | Status: AC
  Administered 2021-07-01: 1000 mL via INTRAVENOUS

## 2021-07-01 MED ORDER — CAPSAICIN 0.025 % EX CREA
TOPICAL_CREAM | CUTANEOUS | Status: AC
  Administered 2021-07-01: 1 via TOPICAL
  Filled 2021-07-01: qty 60

## 2021-07-01 NOTE — ED Provider Notes (Signed)
?[facility] EM[facility] DEPARTMENT ?Provider Note ? ? ?CSN: [id number] ?Arrival date & time: 07/01/21  1646 ? ?  ? ?History ?PMH: HTN, HLD, GERD ?Chief Complaint  ?Patient presents with  ? Emesis  ? ? ?Shannon Stuart is a 61 y.o. female. ?Patient presents with a chief complaint of nausea and vomiting.  She states that yesterday he started vomiting profusely.  She has had vomited so times that he cannot count.  She says that it initially went away last night, but returned today.  She usually has vomiting associated with her cough.  The cough started about 2 days ago and has been mildly productive with yellow sputum.  I asked if the vomiting is caused by the coughing and she insists that is caused by nausea, however every time she is coughing profusely she ends up gagging and dry heaving.  States she has been unable to keep any food down for a day and a half now.  She also started having diarrhea today and has had 3 episodes so far.  It is nonbloody.  She has associated mid abdominal pain.  States that she has this same thing happen periodically.  Normally she gets Phenergan, IV fluids, and pain medication seems to solve the problem.  Denies any fevers, chills, chest pain, shortness of breath, constipation. ? ?Emesis ?Associated symptoms: abdominal pain, cough and diarrhea   ?Associated symptoms: no chills, no fever, no headaches and no sore throat   ? ?  ? ?Home Medications ?Prior to Admission medications   ?Medication Sig Start Date End Date Taking? Authorizing Provider  ?ALPRAZolam (XANAX) 1 MG tablet TAKE 1 TABLET BY MOUTH 3 TIMES DAILY. 12/06/20   Zenia Resides, MD  ?amLODipine (NORVASC) 5 MG tablet Take 1 tablet (5 mg total) by mouth daily. 11/29/20   Zenia Resides, MD  ?aspirin 81 MG chewable tablet Chew 1 tablet (81 mg total) by mouth 2 (two) times daily. For 2 weeks then once a day for 2 weeks for blood clot prevention. 05/30/21   Loni Dolly, PA-C  ?Cholecalciferol (VITAMIN D) 125 MCG (5000 UT) CAPS  Take 5,000 Units by mouth daily.    [provider]  ?cyclobenzaprine (FLEXERIL) 10 MG tablet Take 1 tablet (10 mg total) by mouth 3 (three) times daily. 01/01/21   Zenia Resides, MD  ?diclofenac Sodium (VOLTAREN) 1 % GEL APPLY 2 GRAMS TOPICALLY FOUR TIMES DAILY ?Patient taking differently: 2 g daily as needed (pain). 09/08/20   Zenia Resides, MD  ?lisinopril (ZESTRIL) 40 MG tablet Take 1 tablet (40 mg total) by mouth daily. 11/29/20   Zenia Resides, MD  ?Multiple Vitamins-Minerals (MULTIVITAMIN WITH MINERALS) tablet Take 1 tablet by mouth daily.    [provider]  ?Omega-3 Fatty Acids (FISH OIL) 1000 MG CAPS Take 1,000 mg by mouth daily.     [provider]  ?oxyCODONE (OXY IR/ROXICODONE) 5 MG immediate release tablet Take 1 tablet (5 mg total) by mouth every 6 (six) hours as needed for severe pain or breakthrough pain (in addition fo baseline pain meds if needed.). 05/30/21   Loni Dolly, PA-C  ?oxyCODONE-acetaminophen (PERCOCET) 10-325 MG tablet Take 1 tablet by mouth 3 (three) times daily. 04/27/21   [provider]  ?pantoprazole (PROTONIX) 40 MG tablet Take 1 tablet (40 mg total) by mouth daily before breakfast. 02/09/21   Esterwood, Amy S, PA-C  ?potassium chloride SA (KLOR-CON) 20 MEQ tablet Take 1 tablet (20 mEq total) by mouth daily. 08/16/20  Drenda Freeze, MD  ?promethazine (PHENERGAN) 25 MG suppository Place 1 suppository (25 mg total) rectally every 6 (six) hours as needed for nausea or vomiting. 08/15/20   Zenia Resides, MD  ?rosuvastatin (CRESTOR) 20 MG tablet TAKE 1 TABLET EVERY DAY 12/28/20   Zenia Resides, MD  ?traZODone (DESYREL) 100 MG tablet Take 1 tablet (100 mg total) by mouth at bedtime. 01/01/21   Zenia Resides, MD  ?vitamin B-12 (CYANOCOBALAMIN) 100 MCG tablet Take 100 mcg by mouth once a week.    [provider]  ?   ? ?Allergies    ?Morphine and related and Zofran [ondansetron hcl]   ? ?Review of Systems   ?Review of  Systems  ?Constitutional:  Positive for appetite change. Negative for chills and fever.  ?HENT:  Negative for congestion, rhinorrhea and sore throat.   ?Respiratory:  Positive for cough. Negative for shortness of breath.   ?Cardiovascular:  Negative for chest pain.  ?Gastrointestinal:  Positive for abdominal pain, diarrhea, nausea and vomiting. Negative for abdominal distention, anal bleeding, blood in stool and constipation.  ?Endocrine: Positive for polyuria.  ?Genitourinary:  Negative for dysuria, flank pain, hematuria, pelvic pain, vaginal bleeding and vaginal discharge.  ?Musculoskeletal:  Negative for back pain.  ?Neurological:  Negative for dizziness, weakness, light-headedness and headaches.  ?All other systems reviewed and are negative. ? ?Physical Exam ?Updated Vital Signs ?BP (!) 164/122   Pulse 87   Temp 98.8 ?F (37.1 ?C) (Oral)   Resp 16   Ht 5' 8.5" (1.74 m)   Wt 93 kg   LMP 09/20/2011   SpO2 100%   BMI 30.72 kg/m?  ?Physical Exam ?Vitals and nursing note reviewed.  ?Constitutional:   ?   General: She is not in acute distress. ?   Appearance: Normal appearance. She is not ill-appearing, toxic-appearing or diaphoretic.  ?   Comments: Patient having persistent cough and dry heaving associated with this.  ?HENT:  ?   Head: Normocephalic and atraumatic.  ?   Nose: No nasal deformity.  ?   Mouth/Throat:  ?   Lips: Pink. No lesions.  ?   Mouth: Mucous membranes are moist. No injury, lacerations, oral lesions or angioedema.  ?   Pharynx: Oropharynx is clear. Uvula midline. No pharyngeal swelling, oropharyngeal exudate, posterior oropharyngeal erythema or uvula swelling.  ?Eyes:  ?   General: Gaze aligned appropriately. No scleral icterus.    ?   Right eye: No discharge.     ?   Left eye: No discharge.  ?   Conjunctiva/sclera: Conjunctivae normal.  ?   Right eye: Right conjunctiva is not injected. No exudate or hemorrhage. ?   Left eye: Left conjunctiva is not injected. No exudate or  hemorrhage. ?Cardiovascular:  ?   Rate and Rhythm: Regular rhythm. Tachycardia present.  ?   Pulses: Normal pulses.     ?     Radial pulses are 2+ on the right side and 2+ on the left side.  ?     Dorsalis pedis pulses are 2+ on the right side and 2+ on the left side.  ?   Heart sounds: Normal heart sounds, S1 normal and S2 normal. Heart sounds not distant. No murmur heard. ?  No friction rub. No gallop. No S3 or S4 sounds.  ?Pulmonary:  ?   Effort: Pulmonary effort is normal. No accessory muscle usage or respiratory distress.  ?   Breath sounds: Normal breath sounds. No stridor. No wheezing,  rhonchi or rales.  ?Chest:  ?   Chest wall: No tenderness.  ?Abdominal:  ?   General: Abdomen is flat. Bowel sounds are normal. There is no distension.  ?   Palpations: Abdomen is soft. There is no mass or pulsatile mass.  ?   Tenderness: There is generalized abdominal tenderness. There is guarding. There is no right CVA tenderness, left CVA tenderness or rebound. Negative signs include Murphy's sign, Rovsing's sign and McBurney's sign.  ?   Comments: Periumbilical area with most of pain  ?Musculoskeletal:  ?   Right lower leg: No edema.  ?   Left lower leg: No edema.  ?Skin: ?   General: Skin is warm and dry.  ?   Coloration: Skin is not jaundiced or pale.  ?   Findings: No bruising, erythema, lesion or rash.  ?Neurological:  ?   General: No focal deficit present.  ?   Mental Status: She is alert and oriented to person, place, and time.  ?   GCS: GCS eye subscore is 4. GCS verbal subscore is 5. GCS motor subscore is 6.  ?Psychiatric:     ?   Mood and Affect: Mood normal.     ?   Behavior: Behavior normal. Behavior is cooperative.  ? ? ?ED Results / Procedures / Treatments   ?Labs ?(all labs ordered are listed, but only abnormal results are displayed) ?Labs Reviewed  ?CBC WITH DIFFERENTIAL/PLATELET - Abnormal; Notable for the following components:  ?    Result Value  ? Hemoglobin 11.9 (*)   ? RDW 17.6 (*)   ? All other  components within normal limits  ?RESP PANEL BY RT-PCR (FLU A&B, COVID) [id number]  ?COMPREHENSIVE METABOLIC PANEL  ?LIPASE, BLOOD  ?URINALYSIS, ROUTINE W REFLEX MICROSCOPIC  ? ? ?EKG ?EKG Interpretation ? ?Date/Time:  Sunday July 01 2021 1

## 2021-07-01 NOTE — Discharge Instructions (Signed)
ThePlease follow-up with your primary care provider.  Please return to the ER for any new or concerning symptoms. ? ?Please drink plenty of water, take your prescribed medications.  I am discharging you home with the capsaicin cream that was helpful for you here.  Please use this at home as needed.  Warm showers can also have a similar effect. ? ?Please take the nausea medicine as prescribed. ?

## 2021-07-01 NOTE — ED Provider Notes (Signed)
Accepted handoff at shift change from G. Loeffler PA-C. Please see prior provider note for more detail.  ? ?Briefly: Patient is 61 y.o. with Hx of cyclic vomiting ?NV since yesterday. Prior provider HPI below:  ? ?"Patient presents with a chief complaint of nausea and vomiting.  She states that yesterday he started vomiting profusely.  She has had vomited so times that he cannot count.  She says that it initially went away last night, but returned today.  She usually has vomiting associated with her cough.  The cough started about 2 days ago and has been mildly productive with yellow sputum.  I asked if the vomiting is caused by the coughing and she insists that is caused by nausea, however every time she is coughing profusely she ends up gagging and dry heaving.  States she has been unable to keep any food down for a day and a half now.  She also started having diarrhea today and has had 3 episodes so far.  It is nonbloody.  She has associated mid abdominal pain.  States that she has this same thing happen periodically.  Normally she gets Phenergan, IV fluids, and pain medication seems to solve the problem.  Denies any fevers, chills, chest pain, shortness of breath, constipation." ? ? ?DDX: concern for intractable vomiting.  ? ?Plan: re-eval ? ? ? ? ? ?Physical Exam  ?BP (!) 164/122   Pulse 87   Temp 98.8 ?F (37.1 ?C) (Oral)   Resp 16   Ht 5' 8.5" (1.74 m)   Wt 93 kg   LMP 09/20/2011   SpO2 100%   BMI 30.72 kg/m?  ? ?Physical Exam ? ?Procedures  ?Procedures ? ?ED Course / MDM  ? ?  ? ?Medical Decision Making ?Amount and/or Complexity of Data Reviewed ?Labs: ordered. ?Radiology: ordered. ? ?Risk ?OTC drugs. ?Prescription drug management. ? ? ?Bedside handoff from prior provider ? ?Patient reevaluated after nausea medicine was administered.  Capsaicin was also administered. ? ?Patient feels much improved.  She still has some mild generalized abdominal tenderness but no guarding or rebound. ? ?She is  tolerating p.o.  States that she is not having any symptoms that her deviating from her normal symptoms that she suffers from with her cyclic vomiting. ? ?CBC CMP urinalysis without any significant abnormalities.  COVID influenza negative. ?Chest x-ray personally viewed images of and agree with radiology read no acute disease.  EKG approximately normal QT intervals. ? ?We will discharge home with Compazine suppositories as these are cheaper for her and Compazine seem to be very effective for her.  Return precautions provided.  She feels much improved and would like to be discharged home at this time.  Tolerating p.o. and at her baseline ambulation at time of discharge. ? ?  ?Tedd Sias, [place] ?07/01/21 2100 ? ?  ?Tegeler, Gwenyth Allegra, MD ?07/01/21 2140 ? ?

## 2021-07-01 NOTE — ED Triage Notes (Addendum)
Pt arrives pov with c/o emesis starting last night, worsening this am. Also reports lower abdominal pain with diarrhea. Last marijuana use Friday ?

## 2021-07-01 NOTE — ED Notes (Signed)
MSE waiver explained. Pt verbalizes understanding. Signature pad not functioning  ?

## 2021-07-12 ENCOUNTER — Ambulatory Visit (INDEPENDENT_AMBULATORY_CARE_PROVIDER_SITE_OTHER): Payer: Medicare HMO | Admitting: Family Medicine

## 2021-07-12 ENCOUNTER — Encounter: Payer: Self-pay | Admitting: Family Medicine

## 2021-07-12 DIAGNOSIS — F32A Depression, unspecified: Secondary | ICD-10-CM

## 2021-07-12 DIAGNOSIS — F418 Other specified anxiety disorders: Secondary | ICD-10-CM

## 2021-07-12 DIAGNOSIS — Z96642 Presence of left artificial hip joint: Secondary | ICD-10-CM | POA: Diagnosis not present

## 2021-07-12 DIAGNOSIS — M545 Low back pain, unspecified: Secondary | ICD-10-CM

## 2021-07-12 DIAGNOSIS — R110 Nausea: Secondary | ICD-10-CM | POA: Diagnosis not present

## 2021-07-12 DIAGNOSIS — I10 Essential (primary) hypertension: Secondary | ICD-10-CM

## 2021-07-12 MED ORDER — PROCHLORPERAZINE 25 MG RE SUPP: 25.0000 mg | Freq: Three times a day (TID) | RECTAL | 2 refills | Status: DC | PRN

## 2021-07-12 MED ORDER — ALPRAZOLAM 1 MG PO TABS: 1.0000 mg | ORAL_TABLET | Freq: Three times a day (TID) | ORAL | 5 refills | Status: DC

## 2021-07-12 NOTE — Patient Instructions (Addendum)
The only problem with compazine is the possibility of tardive dyskinesia if you took a lot of the medicine regularly.  It is fine to take 3-4 times per year ? ?I refilled your alprazolam and compazine ? ?Good luck with the physical therapy. ? ?I worry that you are on too much blood pressure medicine which may be causing the lightheadedness.  Stop the amlodipine.  Keep an eye on your BP.  The goal is for most readings to be 140/90 or lower.  We may need to restart the amlodipine at some point.   ?

## 2021-07-13 ENCOUNTER — Encounter: Payer: Self-pay | Admitting: Family Medicine

## 2021-07-13 NOTE — Progress Notes (Signed)
? ? ?  SUBJECTIVE:  ? ?CHIEF COMPLAINT / HPI:  ? ?Several issues: ?Recent left hip replacement.  Recovery has been normal to slow.  There was a delay in beginning her PT, but it is due to start next week.  ?Pain: Had both oxy 10 and 5 on her med list.  Per patient, oxy 10 is chronic (at least two years) for her back pain initially and more recently for her hip pain.  The oxy 5 was a temporary boost in the pre and post op periods.  She is now off the oxy 5 and I removed from her med list. If I become the  primary prescriber of the oxy 10s, I will discuss weaning with her.  Of note she is also on a benzo but she is below the 50 MME line (45 MME) which is the typical cutoff that prohibits concominant benzo treatment. ?Asks for a refill on her alprazolam.  This has worked well for her and she has not asked for any excalation in dose.  Again consider wean but not today. ?Strange history of hypersensitivity to smell which triggers intractable nausea for several days necessitating periodic ER visits.  Was on phenergan.  Apparently, last ER visit was given compazine, which worked better.   ?Some lightheadedness.  Has run high BP.  Interestingly, today's BP is on the lowish side.  She is able to check home BPs ? ? ? ?OBJECTIVE:  ? ?BP 111/67   Pulse 90   Ht 5' 8.5" (1.74 m)   Wt 208 lb 9.6 oz (94.6 kg)   LMP 09/20/2011   SpO2 95%   BMI 31.26 kg/m?   ?Lowish BP noted. ?Lungs clear ?Cardiac RRR without m or g ? ?ASSESSMENT/PLAN:  ? ?Nausea ?Given compazine with the warning to use as little as possible because of the possible long term side effect of tardive dyskinesia.   ? ?History of total left hip replacement ?Recovery OK.  We are both glad that PT is starting.   ? ?Anxiety with depression ?Refilled xanax.  Stable ? ?Low back pain potentially associated with radiculopathy ?Added chronic narcotic therapy through ortho to overview of this problem. ? ?HYPERTENSION, BENIGN ESSENTIAL ?Discontinue amlodipine and follow home  BP ?  ? ? ?Zenia Resides, MD ?[place]  ?

## 2021-07-13 NOTE — Assessment & Plan Note (Addendum)
Refilled xanax.  Stable ?

## 2021-07-13 NOTE — Assessment & Plan Note (Signed)
>>  ASSESSMENT AND PLAN FOR HYPERTENSION, BENIGN ESSENTIAL WRITTEN ON 07/13/2021 10:48 AM BY HENSEL, Azucena Bollard, MD  Discontinue amlodipine  and follow home BP

## 2021-07-13 NOTE — Assessment & Plan Note (Signed)
Recovery OK.  We are both glad that PT is starting.   ?

## 2021-07-13 NOTE — Assessment & Plan Note (Signed)
Added chronic narcotic therapy through ortho to overview of this problem. ?

## 2021-07-13 NOTE — Assessment & Plan Note (Signed)
Discontinue amlodipine and follow home BP ?

## 2021-07-13 NOTE — Assessment & Plan Note (Signed)
Given compazine with the warning to use as little as possible because of the possible long term side effect of tardive dyskinesia.   ?

## 2021-07-16 ENCOUNTER — Other Ambulatory Visit: Payer: Self-pay | Admitting: Family Medicine

## 2021-07-16 DIAGNOSIS — R110 Nausea: Secondary | ICD-10-CM

## 2021-07-16 MED ORDER — PROMETHAZINE HCL 25 MG PO TABS: 25.0000 mg | ORAL_TABLET | Freq: Three times a day (TID) | ORAL | 0 refills | Status: DC | PRN

## 2021-07-18 ENCOUNTER — Other Ambulatory Visit: Payer: Self-pay | Admitting: Family Medicine

## 2021-07-18 DIAGNOSIS — R110 Nausea: Secondary | ICD-10-CM

## 2021-07-20 ENCOUNTER — Ambulatory Visit: Payer: Medicare HMO | Attending: Orthopaedic Surgery | Admitting: Physical Therapy

## 2021-07-20 ENCOUNTER — Encounter: Payer: Self-pay | Admitting: Physical Therapy

## 2021-07-20 DIAGNOSIS — R2689 Other abnormalities of gait and mobility: Secondary | ICD-10-CM | POA: Diagnosis present

## 2021-07-20 DIAGNOSIS — M25552 Pain in left hip: Secondary | ICD-10-CM | POA: Insufficient documentation

## 2021-07-20 DIAGNOSIS — M6281 Muscle weakness (generalized): Secondary | ICD-10-CM | POA: Diagnosis present

## 2021-07-20 DIAGNOSIS — R2681 Unsteadiness on feet: Secondary | ICD-10-CM | POA: Insufficient documentation
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
## 2021-08-01 ENCOUNTER — Ambulatory Visit: Payer: Medicare HMO | Attending: Orthopaedic Surgery

## 2021-08-01 DIAGNOSIS — M6281 Muscle weakness (generalized): Secondary | ICD-10-CM | POA: Insufficient documentation

## 2021-08-01 DIAGNOSIS — R2681 Unsteadiness on feet: Secondary | ICD-10-CM | POA: Diagnosis present

## 2021-08-01 DIAGNOSIS — R2689 Other abnormalities of gait and mobility: Secondary | ICD-10-CM | POA: Insufficient documentation

## 2021-08-01 DIAGNOSIS — M25552 Pain in left hip: Secondary | ICD-10-CM | POA: Insufficient documentation
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
## 2021-08-03 ENCOUNTER — Ambulatory Visit: Payer: Medicare HMO | Admitting: Physical Therapy

## 2021-08-03 ENCOUNTER — Encounter: Payer: Self-pay | Admitting: Physical Therapy

## 2021-08-03 DIAGNOSIS — R2689 Other abnormalities of gait and mobility: Secondary | ICD-10-CM

## 2021-08-03 DIAGNOSIS — R2681 Unsteadiness on feet: Secondary | ICD-10-CM

## 2021-08-03 DIAGNOSIS — M25552 Pain in left hip: Secondary | ICD-10-CM | POA: Diagnosis not present

## 2021-08-03 DIAGNOSIS — M6281 Muscle weakness (generalized): Secondary | ICD-10-CM
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
## 2021-08-08 ENCOUNTER — Ambulatory Visit: Payer: Medicare HMO

## 2021-08-08 DIAGNOSIS — M6281 Muscle weakness (generalized): Secondary | ICD-10-CM

## 2021-08-08 DIAGNOSIS — M25552 Pain in left hip: Secondary | ICD-10-CM | POA: Diagnosis not present

## 2021-08-08 DIAGNOSIS — R2689 Other abnormalities of gait and mobility: Secondary | ICD-10-CM

## 2021-08-08 DIAGNOSIS — R2681 Unsteadiness on feet: Secondary | ICD-10-CM
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
## 2021-08-10 ENCOUNTER — Encounter: Payer: Self-pay | Admitting: Physical Therapy

## 2021-08-10 ENCOUNTER — Ambulatory Visit: Payer: Medicare HMO | Admitting: Physical Therapy

## 2021-08-10 DIAGNOSIS — M25552 Pain in left hip: Secondary | ICD-10-CM | POA: Diagnosis not present

## 2021-08-10 DIAGNOSIS — M6281 Muscle weakness (generalized): Secondary | ICD-10-CM

## 2021-08-10 DIAGNOSIS — R2689 Other abnormalities of gait and mobility: Secondary | ICD-10-CM

## 2021-08-10 DIAGNOSIS — R2681 Unsteadiness on feet: Secondary | ICD-10-CM
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
## 2021-08-15 ENCOUNTER — Encounter: Payer: Self-pay | Admitting: Physical Therapy

## 2021-08-15 ENCOUNTER — Ambulatory Visit: Payer: Medicare HMO | Admitting: Physical Therapy

## 2021-08-15 DIAGNOSIS — R2681 Unsteadiness on feet: Secondary | ICD-10-CM

## 2021-08-15 DIAGNOSIS — M6281 Muscle weakness (generalized): Secondary | ICD-10-CM

## 2021-08-15 DIAGNOSIS — M25552 Pain in left hip: Secondary | ICD-10-CM | POA: Diagnosis not present

## 2021-08-15 DIAGNOSIS — R2689 Other abnormalities of gait and mobility: Secondary | ICD-10-CM
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
## 2021-08-17 ENCOUNTER — Telehealth: Payer: Self-pay | Admitting: Physical Therapy

## 2021-08-17 ENCOUNTER — Ambulatory Visit: Payer: Medicare HMO | Admitting: Physical Therapy

## 2021-08-17 NOTE — Telephone Encounter (Signed)
Called and informed patient of missed visit and provided reminder of next appt and attendance policy.  

## 2021-08-22 ENCOUNTER — Ambulatory Visit: Payer: Medicare HMO

## 2021-08-22 ENCOUNTER — Other Ambulatory Visit: Payer: Self-pay | Admitting: Family Medicine

## 2021-08-22 DIAGNOSIS — M25552 Pain in left hip: Secondary | ICD-10-CM

## 2021-08-22 DIAGNOSIS — R2681 Unsteadiness on feet: Secondary | ICD-10-CM

## 2021-08-22 DIAGNOSIS — M6281 Muscle weakness (generalized): Secondary | ICD-10-CM
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
## 2021-08-24 ENCOUNTER — Ambulatory Visit: Payer: Medicare HMO | Admitting: Physical Therapy

## 2021-08-28 ENCOUNTER — Encounter: Payer: Self-pay | Admitting: *Deleted

## 2021-08-29 ENCOUNTER — Ambulatory Visit: Payer: Medicare HMO | Attending: Orthopaedic Surgery

## 2021-08-29 DIAGNOSIS — R2689 Other abnormalities of gait and mobility: Secondary | ICD-10-CM | POA: Insufficient documentation

## 2021-08-29 DIAGNOSIS — M6281 Muscle weakness (generalized): Secondary | ICD-10-CM | POA: Diagnosis present

## 2021-08-29 DIAGNOSIS — M25552 Pain in left hip: Secondary | ICD-10-CM | POA: Diagnosis present

## 2021-08-29 DIAGNOSIS — R2681 Unsteadiness on feet: Secondary | ICD-10-CM | POA: Diagnosis present
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
## 2021-08-31 ENCOUNTER — Ambulatory Visit: Payer: Medicare HMO | Admitting: Physical Therapy

## 2021-08-31 ENCOUNTER — Encounter: Payer: Self-pay | Admitting: Physical Therapy

## 2021-08-31 DIAGNOSIS — M25552 Pain in left hip: Secondary | ICD-10-CM | POA: Diagnosis not present

## 2021-08-31 DIAGNOSIS — R2681 Unsteadiness on feet: Secondary | ICD-10-CM

## 2021-08-31 DIAGNOSIS — M6281 Muscle weakness (generalized): Secondary | ICD-10-CM

## 2021-08-31 DIAGNOSIS — R2689 Other abnormalities of gait and mobility: Secondary | ICD-10-CM
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
## 2021-09-05 ENCOUNTER — Ambulatory Visit: Payer: Medicare HMO

## 2021-10-17 ENCOUNTER — Other Ambulatory Visit: Payer: Self-pay | Admitting: Family Medicine

## 2021-10-17 DIAGNOSIS — I10 Essential (primary) hypertension: Secondary | ICD-10-CM

## 2021-10-30 ENCOUNTER — Emergency Department (HOSPITAL_BASED_OUTPATIENT_CLINIC_OR_DEPARTMENT_OTHER)
Admission: EM | Admit: 2021-10-30 | Discharge: 2021-10-31 | Disposition: A | Discharge status: Home / Self Care | Payer: Medicare HMO | Attending: Emergency Medicine | Admitting: Emergency Medicine

## 2021-10-30 ENCOUNTER — Emergency Department (HOSPITAL_BASED_OUTPATIENT_CLINIC_OR_DEPARTMENT_OTHER): Payer: Medicare HMO

## 2021-10-30 ENCOUNTER — Encounter (HOSPITAL_BASED_OUTPATIENT_CLINIC_OR_DEPARTMENT_OTHER): Payer: Self-pay | Admitting: Obstetrics and Gynecology

## 2021-10-30 ENCOUNTER — Other Ambulatory Visit: Payer: Self-pay

## 2021-10-30 DIAGNOSIS — Z20822 Contact with and (suspected) exposure to covid-19: Secondary | ICD-10-CM | POA: Insufficient documentation

## 2021-10-30 DIAGNOSIS — Z79899 Other long term (current) drug therapy: Secondary | ICD-10-CM | POA: Insufficient documentation

## 2021-10-30 DIAGNOSIS — Z87891 Personal history of nicotine dependence: Secondary | ICD-10-CM | POA: Insufficient documentation

## 2021-10-30 DIAGNOSIS — R42 Dizziness and giddiness: Secondary | ICD-10-CM | POA: Diagnosis present

## 2021-10-30 DIAGNOSIS — G43811 Other migraine, intractable, with status migrainosus: Secondary | ICD-10-CM | POA: Diagnosis not present

## 2021-10-30 DIAGNOSIS — I10 Essential (primary) hypertension: Secondary | ICD-10-CM | POA: Insufficient documentation

## 2021-10-30 DIAGNOSIS — R519 Headache, unspecified: Secondary | ICD-10-CM

## 2021-10-30 DIAGNOSIS — G43911 Migraine, unspecified, intractable, with status migrainosus: Secondary | ICD-10-CM

## 2021-10-30 DIAGNOSIS — Z96649 Presence of unspecified artificial hip joint: Secondary | ICD-10-CM | POA: Diagnosis not present

## 2021-10-30 LAB — HEPATIC FUNCTION PANEL
ALT: 21 U/L (ref 0–44)
AST: 19 U/L (ref 15–41)
Albumin: 4.3 g/dL (ref 3.5–5.0)
Alkaline Phosphatase: 72 U/L (ref 38–126)
Bilirubin, Direct: <0.1 mg/dL (ref 0.0–0.2)
Total Bilirubin: 0.8 mg/dL (ref 0.3–1.2)
Total Protein: 7.7 g/dL (ref 6.5–8.1)

## 2021-10-30 LAB — BASIC METABOLIC PANEL
Anion gap: 9 (ref 5–15)
BUN: 17 mg/dL (ref 8–23)
CO2: 32 mmol/L (ref 22–32)
Calcium: 9 mg/dL (ref 8.9–10.3)
Chloride: 100 mmol/L (ref 98–111)
Creatinine, Ser: 0.95 mg/dL (ref 0.44–1.00)
GFR, Estimated: >60 mL/min (ref >60)
Glucose, Bld: 124 mg/dL — ABNORMAL HIGH (ref 70–99)
Potassium: 3.6 mmol/L (ref 3.5–5.1)
Sodium: 141 mmol/L (ref 135–145)

## 2021-10-30 LAB — CBC
HCT: 40.5 % (ref 36.0–46.0)
Hemoglobin: 13.1 g/dL (ref 12.0–15.0)
MCH: 27.6 pg (ref 26.0–34.0)
MCHC: 32.3 g/dL (ref 30.0–36.0)
MCV: 85.3 fL (ref 80.0–100.0)
Platelets: 192 10*3/uL (ref 150–400)
RBC: 4.75 MIL/uL (ref 3.87–5.11)
RDW: 19.3 % — ABNORMAL HIGH (ref 11.5–15.5)
WBC: 9.7 10*3/uL (ref 4.0–10.5)
nRBC: 0 % (ref 0.0–0.2)

## 2021-10-30 LAB — DIFFERENTIAL
Abs Immature Granulocytes: 0.05 10*3/uL (ref 0.00–0.07)
Basophils Absolute: 0.1 10*3/uL (ref 0.0–0.1)
Basophils Relative: 1 %
Eosinophils Absolute: 0.1 10*3/uL (ref 0.0–0.5)
Eosinophils Relative: 1 %
Immature Granulocytes: 1 %
Lymphocytes Relative: 24 %
Lymphs Abs: 2.3 10*3/uL (ref 0.7–4.0)
Monocytes Absolute: 0.5 10*3/uL (ref 0.1–1.0)
Monocytes Relative: 6 %
Neutro Abs: 6.6 10*3/uL (ref 1.7–7.7)
Neutrophils Relative %: 67 %

## 2021-10-30 LAB — LIPASE, BLOOD: Lipase: 23 U/L (ref 11–51)

## 2021-10-30 MED ORDER — SODIUM CHLORIDE 0.9 % IV SOLN: INTRAVENOUS | Status: DC

## 2021-10-30 MED ORDER — SODIUM CHLORIDE 0.9 % IV BOLUS
1000.0000 mL | Freq: Once | INTRAVENOUS | Status: AC
Start: 2021-10-30 — End: 2021-10-31
  Administered 2021-10-30: 1000 mL via INTRAVENOUS

## 2021-10-30 MED ORDER — MECLIZINE HCL 25 MG PO TABS
25.0000 mg | ORAL_TABLET | Freq: Once | ORAL | Status: AC
  Administered 2021-10-30: 25 mg via ORAL
  Filled 2021-10-30: qty 1

## 2021-10-30 NOTE — ED Triage Notes (Signed)
Patient reports to the ER for dizziness. Patient reports it has happened before but usually goes away but today has not gone away. Patient reports she is having trouble holding her head up. Patient reports she is having pain in her back as well. Endorses feeling ear fullness in the left ear as well.

## 2021-10-30 NOTE — ED Provider Notes (Signed)
[facility] EMERGENCY DEPARTMENT Provider Note   CSN: [id number] Arrival date & time: 10/30/21  1958     History {Add pertinent medical, surgical, social history, OB history to HPI:1} Chief Complaint  Patient presents with   Dizziness    Shannon Stuart is a 61 y.o. female.  HPI     Home Medications Prior to Admission medications   Medication Sig Start Date End Date Taking? Authorizing Provider  ALPRAZolam Duanne Moron) 1 MG tablet Take 1 tablet (1 mg total) by mouth 3 (three) times daily. 07/12/21   Zenia Resides, MD  Cholecalciferol (VITAMIN D) 125 MCG (5000 UT) CAPS Take 5,000 Units by mouth daily.    [provider]  COMPRO 25 MG suppository PLACE 1 SUPPOSITORY (25 MG TOTAL) RECTALLY EVERY 8 (EIGHT) HOURS AS NEEDED FOR NAUSEA OR VOMITING. 07/18/21   Zenia Resides, MD  cyclobenzaprine (FLEXERIL) 10 MG tablet Take 1 tablet (10 mg total) by mouth 3 (three) times daily. 01/01/21   Zenia Resides, MD  diclofenac Sodium (VOLTAREN) 1 % GEL APPLY 2 GRAMS TOPICALLY FOUR TIMES DAILY Patient taking differently: 2 g daily as needed (pain). 09/08/20   Zenia Resides, MD  IBU 800 MG tablet TAKE 1 TABLET EVERY 8 HOURS AS NEEDED 08/22/21   Zenia Resides, MD  lisinopril (ZESTRIL) 40 MG tablet TAKE 1 TABLET EVERY DAY 10/18/21   Zenia Resides, MD  Multiple Vitamins-Minerals (MULTIVITAMIN WITH MINERALS) tablet Take 1 tablet by mouth daily.    [provider]  Omega-3 Fatty Acids (FISH OIL) 1000 MG CAPS Take 1,000 mg by mouth daily.     [provider]  oxyCODONE-acetaminophen (PERCOCET) 10-325 MG tablet Take 1 tablet by mouth 3 (three) times daily. 04/27/21   [provider]  pantoprazole (PROTONIX) 40 MG tablet Take 1 tablet (40 mg total) by mouth daily before breakfast. 02/09/21   Esterwood, Amy S, PA-C  potassium chloride SA (KLOR-CON) 20 MEQ tablet Take 1 tablet (20 mEq total) by mouth daily. 08/16/20   Drenda Freeze, MD  promethazine  (PHENERGAN) 25 MG tablet Take 1 tablet (25 mg total) by mouth every 8 (eight) hours as needed for nausea or vomiting. 07/16/21   Zenia Resides, MD  rosuvastatin (CRESTOR) 20 MG tablet TAKE 1 TABLET EVERY DAY 12/28/20   Zenia Resides, MD  traZODone (DESYREL) 100 MG tablet Take 1 tablet (100 mg total) by mouth at bedtime. 01/01/21   Zenia Resides, MD  vitamin B-12 (CYANOCOBALAMIN) 100 MCG tablet Take 100 mcg by mouth once a week.    [provider]      Allergies    Morphine and related and Zofran [ondansetron hcl]    Review of Systems   Review of Systems  Physical Exam Updated Vital Signs BP (!) 182/117   Pulse 76   Temp (!) 97.2 F (36.2 C) (Oral)   Resp 18   Ht 1.727 m ('5\' 8"'$ )   Wt 92.1 kg   LMP 09/20/2011   SpO2 100%   BMI 30.87 kg/m  Physical Exam  ED Results / Procedures / Treatments   Labs (all labs ordered are listed, but only abnormal results are displayed) Labs Reviewed  BASIC METABOLIC PANEL  CBC    EKG None  Radiology No results found.  Procedures Procedures  {Document cardiac monitor, telemetry assessment procedure when appropriate:1}  Medications Ordered in ED Medications - No data to display  ED Course/ Medical Decision Making/ A&P  Medical Decision Making Amount and/or Complexity of Data Reviewed Labs: ordered.   ***  {Document critical care time when appropriate:1} {Document review of labs and clinical decision tools ie heart score, Chads2Vasc2 etc:1}  {Document your independent review of radiology images, and any outside records:1} {Document your discussion with family members, caretakers, and with consultants:1} {Document social determinants of health affecting pt's care:1} {Document your decision making why or why not admission, treatments were needed:1} Final Clinical Impression(s) / ED Diagnoses Final diagnoses:  None    Rx / DC Orders ED Discharge Orders     None

## 2021-10-31 ENCOUNTER — Emergency Department (HOSPITAL_COMMUNITY): Payer: Medicare HMO

## 2021-10-31 ENCOUNTER — Emergency Department (HOSPITAL_BASED_OUTPATIENT_CLINIC_OR_DEPARTMENT_OTHER): Payer: Medicare HMO

## 2021-10-31 ENCOUNTER — Other Ambulatory Visit: Payer: Self-pay | Admitting: Family Medicine

## 2021-10-31 DIAGNOSIS — G4700 Insomnia, unspecified: Secondary | ICD-10-CM

## 2021-10-31 DIAGNOSIS — G8929 Other chronic pain: Secondary | ICD-10-CM

## 2021-10-31 LAB — SARS CORONAVIRUS 2 BY RT PCR: SARS Coronavirus 2 by RT PCR: NEGATIVE

## 2021-10-31 MED ORDER — HYDROMORPHONE HCL 1 MG/ML IJ SOLN
1.0000 mg | Freq: Once | INTRAMUSCULAR | Status: AC
  Administered 2021-10-31: 1 mg via INTRAVENOUS
  Filled 2021-10-31: qty 1

## 2021-10-31 MED ORDER — METOCLOPRAMIDE HCL 5 MG/ML IJ SOLN: 10.0000 mg | Freq: Once | INTRAMUSCULAR | Status: DC

## 2021-10-31 MED ORDER — DROPERIDOL 2.5 MG/ML IJ SOLN
1.2500 mg | Freq: Once | INTRAMUSCULAR | Status: AC
  Administered 2021-10-31: 1.25 mg via INTRAVENOUS
  Filled 2021-10-31: qty 2

## 2021-10-31 MED ORDER — DIAZEPAM 2 MG PO TABS
2.0000 mg | ORAL_TABLET | Freq: Once | ORAL | Status: AC
  Administered 2021-10-31: 2 mg via ORAL
  Filled 2021-10-31: qty 1

## 2021-10-31 MED ORDER — IOHEXOL 350 MG/ML SOLN
75.0000 mL | Freq: Once | INTRAVENOUS | Status: AC | PRN
  Administered 2021-10-31: 75 mL via INTRAVENOUS

## 2021-10-31 MED ORDER — LISINOPRIL 10 MG PO TABS
10.0000 mg | ORAL_TABLET | Freq: Once | ORAL | Status: AC
  Administered 2021-10-31: 10 mg via ORAL
  Filled 2021-10-31: qty 1

## 2021-10-31 MED ORDER — METOCLOPRAMIDE HCL 5 MG/ML IJ SOLN
10.0000 mg | Freq: Once | INTRAMUSCULAR | Status: AC
  Administered 2021-10-31: 10 mg via INTRAVENOUS
  Filled 2021-10-31: qty 2

## 2021-10-31 MED ORDER — SODIUM CHLORIDE 0.9 % IV BOLUS
1000.0000 mL | Freq: Once | INTRAVENOUS | Status: AC
  Administered 2021-10-31: 1000 mL via INTRAVENOUS

## 2021-10-31 MED ORDER — DEXAMETHASONE SODIUM PHOSPHATE 10 MG/ML IJ SOLN
10.0000 mg | Freq: Once | INTRAMUSCULAR | Status: AC
  Administered 2021-10-31: 10 mg via INTRAVENOUS
  Filled 2021-10-31: qty 1

## 2021-10-31 MED ORDER — ACETAMINOPHEN 500 MG PO TABS
1000.0000 mg | ORAL_TABLET | Freq: Once | ORAL | Status: AC
  Administered 2021-10-31: 1000 mg via ORAL
  Filled 2021-10-31: qty 2

## 2021-10-31 MED ORDER — PROCHLORPERAZINE EDISYLATE 10 MG/2ML IJ SOLN
10.0000 mg | Freq: Once | INTRAMUSCULAR | Status: AC
  Administered 2021-10-31: 10 mg via INTRAVENOUS
  Filled 2021-10-31: qty 2

## 2021-10-31 MED ORDER — KETOROLAC TROMETHAMINE 30 MG/ML IJ SOLN: 30.0000 mg | Freq: Once | INTRAMUSCULAR | Status: DC

## 2021-10-31 MED ORDER — KETOROLAC TROMETHAMINE 30 MG/ML IJ SOLN
30.0000 mg | Freq: Once | INTRAMUSCULAR | Status: AC
  Administered 2021-10-31: 30 mg via INTRAVENOUS
  Filled 2021-10-31: qty 1

## 2021-10-31 MED ORDER — DIPHENHYDRAMINE HCL 50 MG/ML IJ SOLN
25.0000 mg | Freq: Once | INTRAMUSCULAR | Status: AC
  Administered 2021-10-31: 25 mg via INTRAVENOUS
  Filled 2021-10-31: qty 1

## 2021-10-31 NOTE — ED Provider Notes (Signed)
Patient with history of hypertension here for evaluation of dizziness, headache that started 8 AM on Tuesday.  Her headache is improving but persist and she continues to be dizzy with unsteady gait.    Care assumed pending CTA. CTA is negative for acute abnormality.  Plan to transfer ED to ED for MRI to rule out stroke.  Discussed with Dr. Dolly Rias, who accept the patient in transport.  Patient has a family member that we will be able to assist in transfer by POV.   Quintella Reichert, MD 10/31/21 [phone]

## 2021-10-31 NOTE — ED Notes (Signed)
Pt assisted to bedside commode to urinate.

## 2021-10-31 NOTE — ED Notes (Signed)
Provider made aware pt was assisted to bathroom and patient was very unsteady and dizzy.

## 2021-10-31 NOTE — ED Provider Notes (Signed)
Patient is a 61 year old female with a past medical history of hypertension and meningioma presenting to the emergency department with headache and dizziness.  Was initially evaluated at [facility] and had CT scan performed that showed no signs of cute intracranial abnormality as a cause of her dizziness and was transferred here for MRI.  Patient's arrival here, she reports continued dizziness without significant improvement.  She states that she is still having a headache but states that her nausea and vomiting have resolved.  She states she does have a history of migraine headaches but that this feels different.  She states that she mostly feels dizzy upon sitting and standing and feels very off balance when she tries to walk.  She states that she has chronic weakness in her left leg from a prior back surgery but denies any new numbness or weakness.  On my exam, she is hypertensive to 387 systolic, vitals are otherwise within normal range.  She is awake and alert in no acute distress.  Pupils are equal and reactive bilaterally with no reproducible nystagmus, she has no cranial nerve deficits, strength is 5 out of 5 in bilateral upper extremities and right lower extremity and 4-5 in left lower extremity, sensation intact, finger-to-nose is intact bilaterally.  Upon standing, the patient appears ataxic and is unable to ambulate.  Patient did report decreased hearing in her left ear as well and does appear to have cerumen though not impacted, will attempt removal and reassessment of symptoms. Patient's MRI showed no acute intracranial abnormality explaining her vertigo.  It did show increasing size of her meningioma.  Neurology will be consulted for patient's continued vertigo for recommendations.  She will be treated for her headache and vertigo with migraine cocktail as well as Valium and be reassessed.   Patient has improvement of headache and dizziness after medication treatment.  She was able to  ambulate steadily.  She is stable for discharge with outpatient primary care and neurology follow-up.   Ottie Glazier, DO 10/31/21 1053

## 2021-10-31 NOTE — Discharge Instructions (Signed)
You were seen in the emergency department for your headache and your dizziness.  Your workup showed no signs of stroke, bleeding in your brain or dehydration.  Your headache and dizziness are thought to be due to a complex migraine and after migraine treatment you had improvement of your symptoms.  Your MRI did show that your meningioma is slightly larger than previous imaging 2 years ago but does not believe to be the cause of your headache and your dizziness.  Your blood pressure was also high here in the emergency department and you should have this rechecked by your primary doctor to determine if you need any changes to your blood pressure medicine.  You should follow-up with your neurologist regarding your migraine headache for reassessment and to see if you need any changes to your current treatment plans.  You should return to the emergency department if your headache gets significantly worse, you start again to have repetitive vomiting, you pass out, you have a seizure, you have numbness or weakness on one side of the body compared to the other, or if you have any other new or concerning symptoms.

## 2021-11-02 ENCOUNTER — Ambulatory Visit (INDEPENDENT_AMBULATORY_CARE_PROVIDER_SITE_OTHER): Payer: Medicare HMO | Admitting: Family Medicine

## 2021-11-02 VITALS — BP 145/100 | HR 83 | Wt 202.8 lb

## 2021-11-02 DIAGNOSIS — R42 Dizziness and giddiness: Secondary | ICD-10-CM | POA: Diagnosis not present

## 2021-11-02 DIAGNOSIS — Z23 Encounter for immunization: Secondary | ICD-10-CM | POA: Diagnosis not present

## 2021-11-02 NOTE — Patient Instructions (Addendum)
It was great seeing you today!  Today we discussed your dizziness. Please make sure to stay hydrated and change positions slowly. I have placed a referral to vestibular rehab, you should hear from them soon. They will work with you on these positions. We will also get a B12 level today. Please make sure to see the neurosurgeon soon.   Please follow up at your next scheduled appointment in 3 weeks, if anything arises between now and then, please don't hesitate to contact our office.   Thank you for allowing [place] to be a part of your medical care!  Thank you, Dr. Larae Grooms

## 2021-11-02 NOTE — Progress Notes (Signed)
    SUBJECTIVE:   CHIEF COMPLAINT / HPI:   Patient presents for follow up after recent ED visit for dizziness. Her dizziness has been intermittent for about a week and she also experiences other symptoms including headache with some photophobia and blurry vision. The first time it occurred, she stood up and felt dizzy which felt like she was spinning. She then sat down for about 20 minutes and then it was resolved until 3 days ago which she says she experienced an episode that was worse than last week. On this day, she stood up and felt so much more dizzy than before and almost ran into a wall. She stood up against a wall for about 15 minutes when she was able to get herself together and drive back home. 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 Labs performed also were unremarkable. Also has a history of hypertension, she takes lisinopril 40 mg daily. At home her BP is 147-829 systolic and 56-21H. PCP discontinued her amlodipine a few months ago because her BP was better. Today she feels better but is worried what is causing this so she can prevent further episodes.    OBJECTIVE:   BP (!) 145/100   Pulse 83   Wt 202 lb 12.8 oz (92 kg)   LMP 09/20/2011   SpO2 100%   BMI 30.84 kg/m   General: Patient well-appearing, in no acute distress. CV: RRR, no murmurs or gallops auscultated Resp: CTAB, no wheezing, rales or rhonchi  Neuro: CN 2-12 gross intact with the exception of decreased hearing in left ear, gross sensation intact, 5/5 UE and LE strength bilaterally, 5/5 grip strength, normal gait with assistance of cane, negative Dix-Hallpike, positive Romberg, 2+ patellar and achilles reflex Psych: mood appropriate   ASSESSMENT/PLAN:   Dizziness -orthostatics negative although seems to be due to postural changes causing hypotension -instructed to follow up with  neurosurgeon for  1.5 cm meningioma -no concern for over treatment of hypertension, continue lisinopril for stroke risk  -awaiting B12 as patient has been taking this supplement for years  -ED labs unremarkable, no evidence of anemia with Hgb 13.1 and electrolytes wnl -referral placed to vestibular rehab  -follow up in 3 weeks with PCP    -Shingrex prescription ordered    Donney Dice, [place]

## 2021-11-02 NOTE — Assessment & Plan Note (Signed)
-  orthostatics negative although seems to be due to postural changes causing hypotension -instructed to follow up with  neurosurgeon for 1.5 cm meningioma -no concern for over treatment of hypertension, continue lisinopril for stroke risk  -awaiting B12 as patient has been taking this supplement for years  -ED labs unremarkable, no evidence of anemia with Hgb 13.1 and electrolytes wnl -referral placed to vestibular rehab  -follow up in 3 weeks with PCP
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
## 2021-11-03 LAB — VITAMIN B12: Vitamin B-12: >2000 pg/mL — ABNORMAL HIGH (ref 232–1245)

## 2021-11-19 ENCOUNTER — Ambulatory Visit (INDEPENDENT_AMBULATORY_CARE_PROVIDER_SITE_OTHER): Payer: Medicare HMO | Admitting: Family Medicine

## 2021-11-19 ENCOUNTER — Encounter: Payer: Self-pay | Admitting: Family Medicine

## 2021-11-19 DIAGNOSIS — H9192 Unspecified hearing loss, left ear: Secondary | ICD-10-CM

## 2021-11-19 DIAGNOSIS — D329 Benign neoplasm of meninges, unspecified: Secondary | ICD-10-CM | POA: Diagnosis not present

## 2021-11-19 DIAGNOSIS — R42 Dizziness and giddiness: Secondary | ICD-10-CM

## 2021-11-19 DIAGNOSIS — E782 Mixed hyperlipidemia: Secondary | ICD-10-CM | POA: Diagnosis not present

## 2021-11-19 DIAGNOSIS — I25119 Atherosclerotic heart disease of native coronary artery with unspecified angina pectoris: Secondary | ICD-10-CM | POA: Diagnosis not present

## 2021-11-19 DIAGNOSIS — I10 Essential (primary) hypertension: Secondary | ICD-10-CM

## 2021-11-19 MED ORDER — ASPIRIN 81 MG PO TBEC: 81.0000 mg | DELAYED_RELEASE_TABLET | Freq: Every day | ORAL | 12 refills | Status: DC

## 2021-11-19 MED ORDER — AMLODIPINE BESYLATE 5 MG PO TABS: 5.0000 mg | ORAL_TABLET | Freq: Every day | ORAL | 3 refills | Status: DC

## 2021-11-19 MED ORDER — ROSUVASTATIN CALCIUM 40 MG PO TABS: 40.0000 mg | ORAL_TABLET | Freq: Every day | ORAL | 3 refills | Status: DC

## 2021-11-19 NOTE — Assessment & Plan Note (Signed)
Sounds more orthostatic.  BPs high and orthostatic BPs done in ER OK.  BP is up and needs treatment.

## 2021-11-19 NOTE — Assessment & Plan Note (Signed)
Secondary prevention.  Restart ASA and increase rosuvastatin.  Episodes could be post circulation TIAs.  May need MRA or neuro referral.

## 2021-11-19 NOTE — Assessment & Plan Note (Signed)
Restart amlodipine

## 2021-11-19 NOTE — Patient Instructions (Addendum)
I am confused about your symptoms and don't have a good explanation.  You may need MRA.  You may need a neurologist and or an ENT consult.  You may also need a special type of PT.     I now consider you secondary prevention so. Increase your rosuvastatin to 40 Go back to taking an 81 mg aspirin every day. Please start taking the amlodipine 5 mg daily again See me after Dr. Ellene Route to figure out next steps.

## 2021-11-19 NOTE — Assessment & Plan Note (Signed)
>>  ASSESSMENT AND PLAN FOR HYPERTENSION, BENIGN ESSENTIAL WRITTEN ON 11/19/2021  4:18 PM BY HENSEL, Azucena Bollard, MD  Restart amlodipine .

## 2021-11-19 NOTE — Progress Notes (Signed)
    SUBJECTIVE:   CHIEF COMPLAINT / HPI:   Dizziness and more.  Unusual constellation of symptoms which include: Dizziness, which she does not describe as vertigo.  She complains that her episodes have all occurred when going from a sitting to standing position. Known meningioma that had recent MRI and showed slow growth.  She has an appointment with Dr. Ellene Route who she knows and trusts. Known CAD - secondary prevention.  Not on antiplatelet treatment.  No evidence of CVA on recent MRI. Deafness left ear.  Does not correlate with right meningioma. Hypertension.  I stopped amolodipine recently due to low BPs and light headedness.  BP has been consistently high of late.    OBJECTIVE:   BP (!) 152/92   Pulse 84   Ht '5\' 8"'$  (1.727 m)   Wt 209 lb 6.4 oz (95 kg)   LMP 09/20/2011   SpO2 97%   BMI 31.84 kg/m   TMs clear bilaterall No nystagmus  Lungs clear Cardiac RRR without m or g Elevated BP noted.  ASSESSMENT/PLAN:  Because she knows and trusts dr. Ellene Route, next step is to see him about next steps.  She will FU with me after she sees [facility]. Meningioma ([place]) FU dr. Ellene Route.  Does not correlate well with acute symptoms.  HLD (hyperlipidemia) Secondary prevention.  Restart ASA and increase rosuvastatin.  Episodes could be post circulation TIAs.  May need MRA or neuro referral.  Hearing loss, left Does not correlate with known right meningioma.  May need eNT referral.   Dizziness Sounds more orthostatic.  BPs high and orthostatic BPs done in ER OK.  BP is up and needs treatment.    HYPERTENSION, BENIGN ESSENTIAL Restart amlodipine.     Zenia Resides, MD [place]

## 2021-11-19 NOTE — Assessment & Plan Note (Signed)
Does not correlate with known right meningioma.  May need eNT referral.

## 2021-11-19 NOTE — Assessment & Plan Note (Signed)
FU dr. Ellene Route.  Does not correlate well with acute symptoms.

## 2021-11-30 ENCOUNTER — Other Ambulatory Visit: Payer: Self-pay | Admitting: Neurological Surgery

## 2021-11-30 DIAGNOSIS — D329 Benign neoplasm of meninges, unspecified: Secondary | ICD-10-CM

## 2021-12-05 ENCOUNTER — Ambulatory Visit (INDEPENDENT_AMBULATORY_CARE_PROVIDER_SITE_OTHER): Payer: Medicare HMO | Admitting: Family Medicine

## 2021-12-05 ENCOUNTER — Encounter: Payer: Self-pay | Admitting: Family Medicine

## 2021-12-05 DIAGNOSIS — D329 Benign neoplasm of meninges, unspecified: Secondary | ICD-10-CM

## 2021-12-05 DIAGNOSIS — R110 Nausea: Secondary | ICD-10-CM

## 2021-12-05 DIAGNOSIS — I10 Essential (primary) hypertension: Secondary | ICD-10-CM | POA: Diagnosis not present

## 2021-12-05 DIAGNOSIS — G8929 Other chronic pain: Secondary | ICD-10-CM | POA: Insufficient documentation

## 2021-12-05 DIAGNOSIS — E782 Mixed hyperlipidemia: Secondary | ICD-10-CM | POA: Diagnosis not present

## 2021-12-05 DIAGNOSIS — G894 Chronic pain syndrome: Secondary | ICD-10-CM

## 2021-12-05 DIAGNOSIS — R42 Dizziness and giddiness: Secondary | ICD-10-CM

## 2021-12-05 MED ORDER — PROMETHAZINE HCL 25 MG PO TABS: 25.0000 mg | ORAL_TABLET | Freq: Three times a day (TID) | ORAL | 0 refills | Status: DC | PRN

## 2021-12-05 NOTE — Assessment & Plan Note (Signed)
>>  ASSESSMENT AND PLAN FOR HYPERTENSION, BENIGN ESSENTIAL WRITTEN ON 12/05/2021  2:07 PM BY HENSEL, Azucena Bollard, MD  Good control.  No change.

## 2021-12-05 NOTE — Assessment & Plan Note (Signed)
Followed by Dr. Ellene Route.  Concern for worsening.

## 2021-12-05 NOTE — Assessment & Plan Note (Signed)
Verified that she is on chronic narcotics and these are filled by pain clinic, not [facility]

## 2021-12-05 NOTE — Assessment & Plan Note (Signed)
Symptomatically improved by working with PT

## 2021-12-05 NOTE — Assessment & Plan Note (Signed)
Good control.  No change. 

## 2021-12-05 NOTE — Assessment & Plan Note (Signed)
Reordered phenergan per patient request.

## 2021-12-05 NOTE — Assessment & Plan Note (Signed)
Tolerating rosuvastatin 40 well.

## 2021-12-05 NOTE — Progress Notes (Signed)
    SUBJECTIVE:   CHIEF COMPLAINT / HPI:   FU Main issue is HBP.  Back on amlodipine 5 mg plus lisinopril.  BP initially elevated, but good on recheck after resting 15 minutes.  Tolerating meds without apparent side effects. Dizziness, improved.  Working with PT.  Pattern is worse on standing.  She has made good adjustments. Worsening headaches.  Known meningiomas.  Seen by Ellene Route and MRI ordered.  He is concerned.  Also may have visual field deficit involved.  Will be tested 9/21  Chronic intermitant nausea.  Controled by prn phenergan suppositories.  Has not had for months due to pharmacy filling trouble.  Asked Rx to be sent to [facility]. Chronic pain on narcotics per pain clinic affiliated with neurosurg group.  We do not prescribe. High cholesterol.  Secondary prevention.  Statin dose increased one month ago.  No side effects.  As an aside, sister in hospital with [place].  [name] is asymptomatic.  OBJECTIVE:   BP 112/84 (BP Location: Right Arm, Cuff Size: Large)   Pulse 90   Ht '5\' 8"'$  (1.727 m)   Wt 208 lb 12.8 oz (94.7 kg)   LMP 09/20/2011   SpO2 97%   BMI 31.75 kg/m   Lungs clear Cardiac RRR without m or g  ASSESSMENT/PLAN:   Meningioma ([facility]) Followed by Dr. Ellene Route.  Concern for worsening.    HYPERTENSION, BENIGN ESSENTIAL Good control.  No change.  HLD (hyperlipidemia) Tolerating rosuvastatin 40 well.  Dizziness Symptomatically improved by working with PT  Nausea Reordered phenergan per patient request.  Chronic pain Verified that she is on chronic narcotics and these are filled by pain clinic, not [place], [place]

## 2021-12-05 NOTE — Patient Instructions (Addendum)
Good visit BP is fine I am glad the dizziness is improved. I will be anxious to see what the MRI shows and what Dr. Ellene Route recommends.   I sent another phenergan prescription to [facility].  I hope they have it.

## 2021-12-08 ENCOUNTER — Other Ambulatory Visit: Payer: Self-pay | Admitting: Family Medicine

## 2021-12-08 DIAGNOSIS — R110 Nausea: Secondary | ICD-10-CM

## 2021-12-08 MED ORDER — PROMETHAZINE HCL 25 MG RE SUPP: 25.0000 mg | Freq: Four times a day (QID) | RECTAL | 2 refills | Status: DC | PRN

## 2021-12-18 ENCOUNTER — Other Ambulatory Visit: Payer: Medicare HMO

## 2021-12-25 ENCOUNTER — Other Ambulatory Visit: Payer: Self-pay | Admitting: Physician Assistant

## 2021-12-27 IMAGING — CT CT CHEST W/ CM
2 of 4 series · 15 of 36 positions shown, 18 images · IV contrast (omnipaque)
Comparison: No priors.

CLINICAL DATA: 58-year-old female with history of abdominal pain
and vomiting. Fatigue and weight loss.

EXAM:
CT CHEST WITH CONTRAST
TECHNIQUE: Multidetector CT imaging of the chest was performed during
intravenous contrast administration.
CONTRAST:  75mL OMNIPAQUE IOHEXOL 300 MG/ML  SOLN

[Series 2: axial st · axial · 0.81mm/px · z∈[-152,+116]mm · 12 of 158 slices shown, 15 images]
[im 12/158  mediastinal]
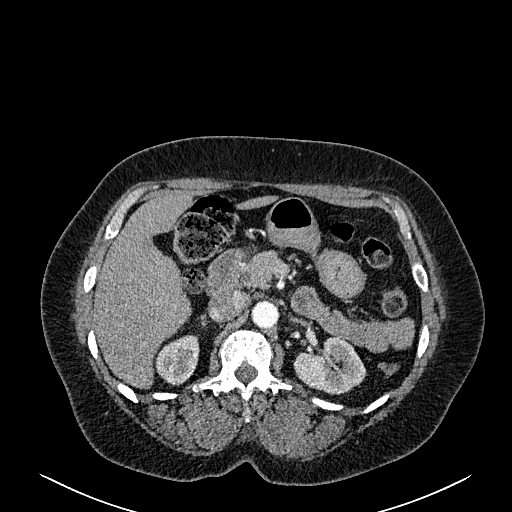
[im 12/158  lung]
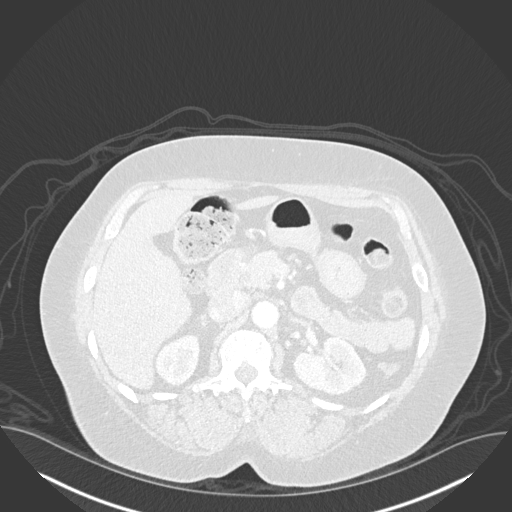
[im 23/158  lung]
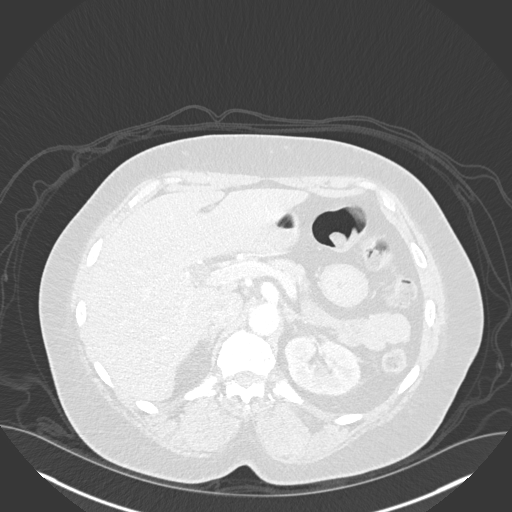
[im 34/158  lung]
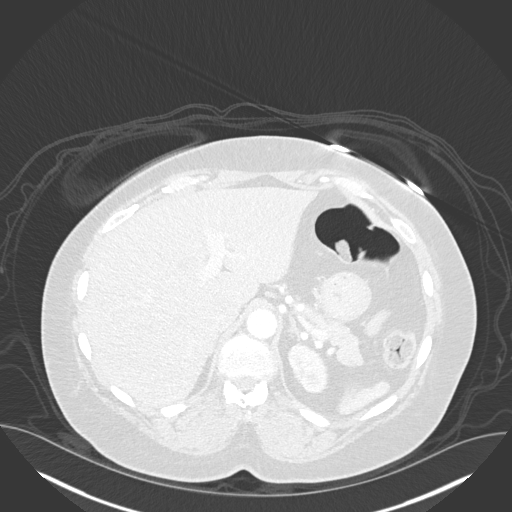
[im 45/158  lung]
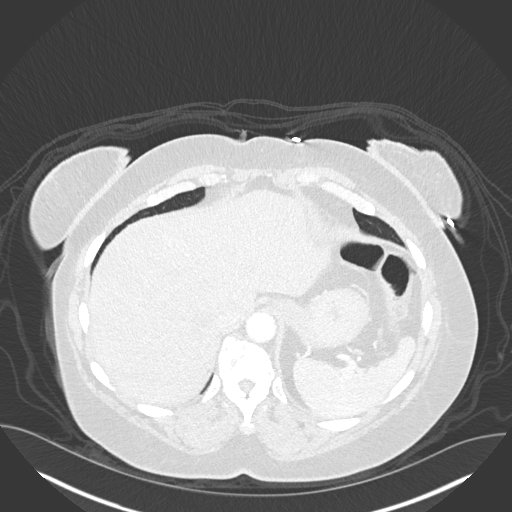
[im 57/158  mediastinal]
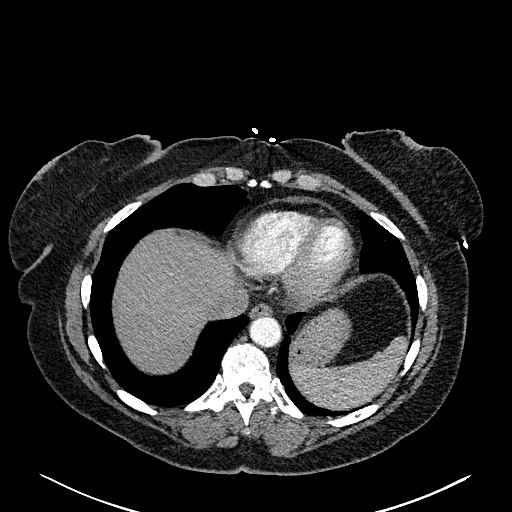
[im 57/158  lung]
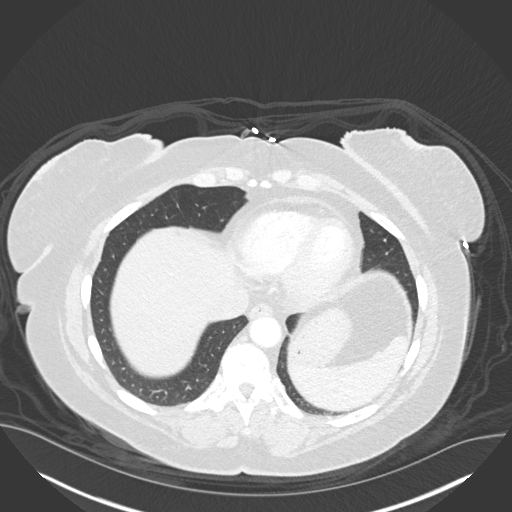
[im 68/158  lung]
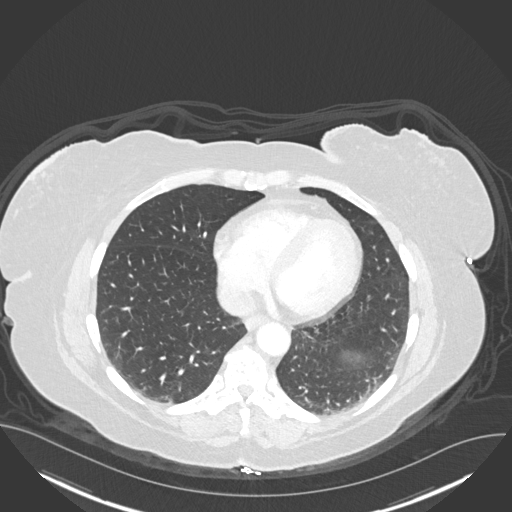
[im 90/158  lung]
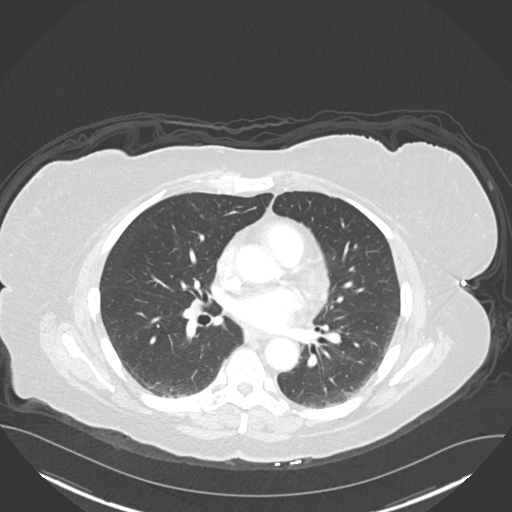
[im 101/158  lung]
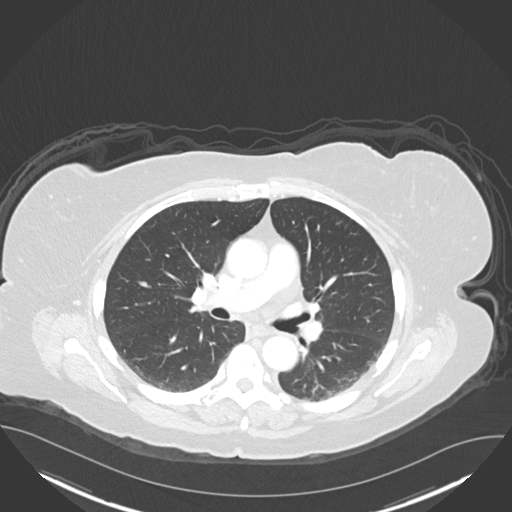
[im 113/158  mediastinal]
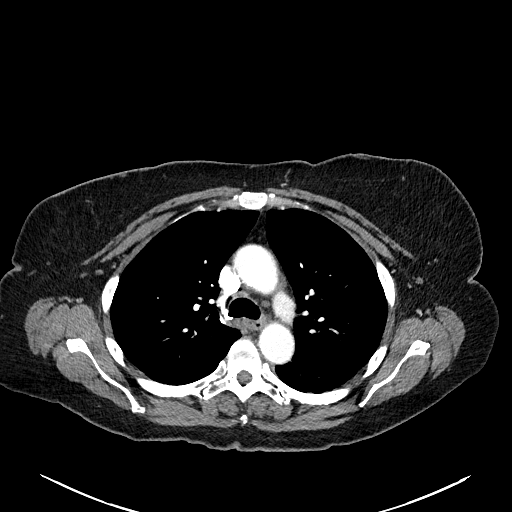
[im 113/158  lung]
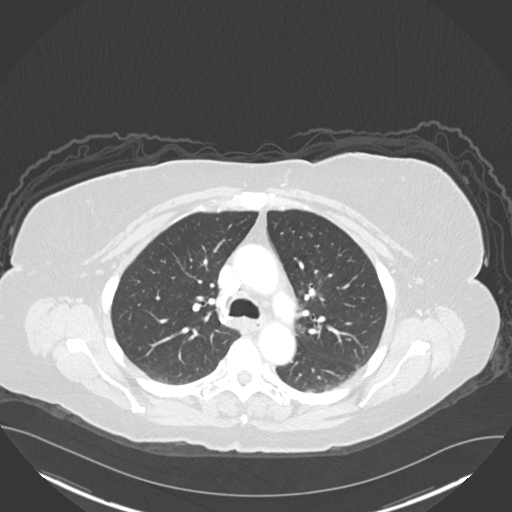
[im 124/158  lung]
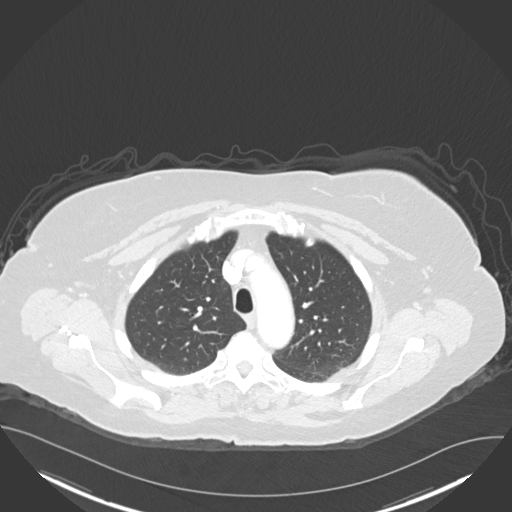
[im 135/158  lung]
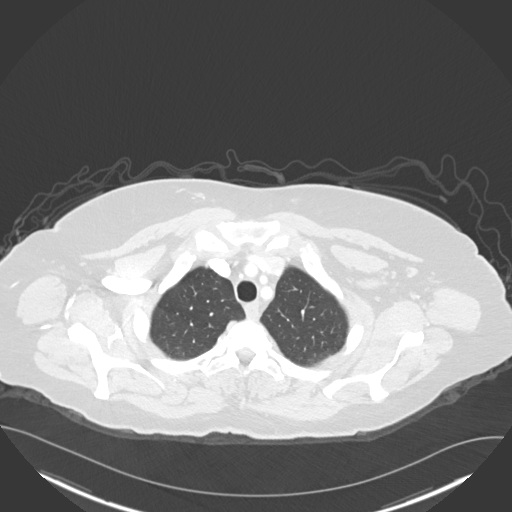
[im 146/158  lung]
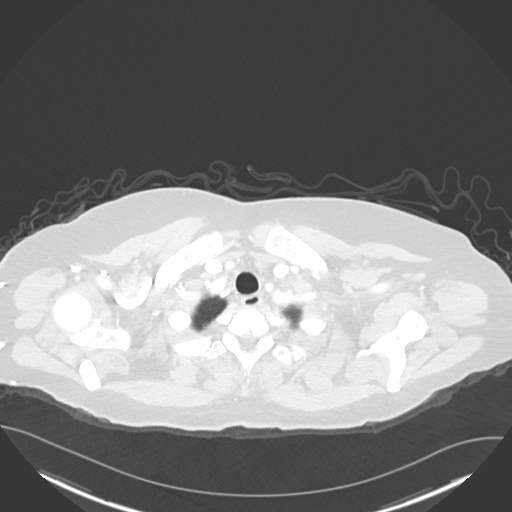

[Series 5: coronal · coronal · 0.70mm/px · 3 of 114 slices shown]
[im 23/114  lung]
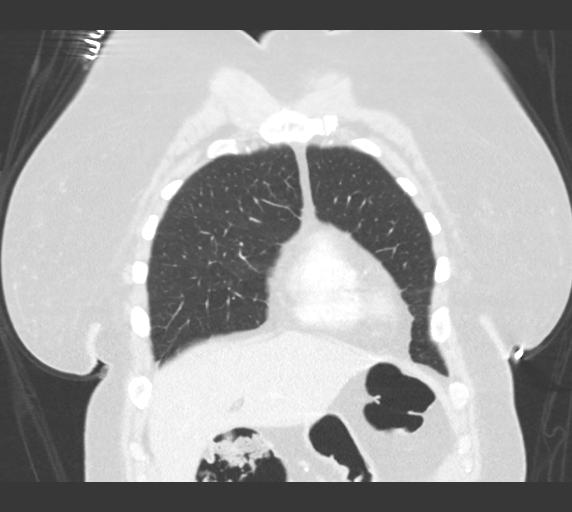
[im 46/114  lung]
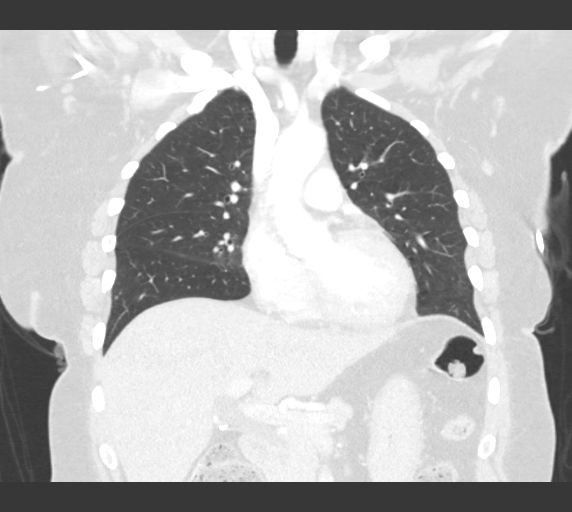
[im 68/114  lung]
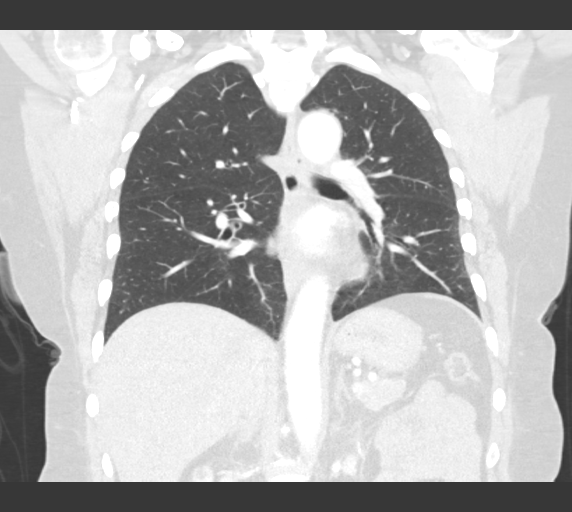

[15 of 36 positions shown; findings below may reference images not displayed]

FINDINGS: Cardiovascular: Heart size is normal. There is no significant
pericardial fluid, thickening or pericardial calcification. No
atherosclerotic calcifications in the thoracic aorta or the coronary
arteries.

Mediastinum/Nodes: No pathologically enlarged mediastinal or hilar
lymph nodes. Please note that accurate exclusion of hilar adenopathy
is limited on noncontrast CT scans. Esophagus is unremarkable in
appearance. No axillary lymphadenopathy.

Lungs/Pleura: No suspicious appearing pulmonary nodules or masses
are noted. No acute consolidative airspace disease. No pleural
effusions.

Upper Abdomen: Status post cholecystectomy.

Musculoskeletal: There are no aggressive appearing lytic or blastic
lesions noted in the visualized portions of the skeleton.
IMPRESSION: 1. No acute abnormality in the thorax.

## 2021-12-29 ENCOUNTER — Ambulatory Visit
Admission: RE | Admit: 2021-12-29 | Discharge: 2021-12-29 | Disposition: A | Discharge status: Home / Self Care | Payer: Medicare HMO | Source: Ambulatory Visit | Attending: Neurological Surgery | Admitting: Neurological Surgery

## 2021-12-29 DIAGNOSIS — D329 Benign neoplasm of meninges, unspecified: Secondary | ICD-10-CM

## 2022-02-22 ENCOUNTER — Ambulatory Visit (INDEPENDENT_AMBULATORY_CARE_PROVIDER_SITE_OTHER): Payer: Medicare HMO | Admitting: Family Medicine

## 2022-02-22 ENCOUNTER — Encounter: Payer: Self-pay | Admitting: Family Medicine

## 2022-02-22 VITALS — BP 132/88 | HR 91 | Ht 68.0 in | Wt 217.8 lb

## 2022-02-22 DIAGNOSIS — Z23 Encounter for immunization: Secondary | ICD-10-CM | POA: Diagnosis not present

## 2022-02-22 DIAGNOSIS — D329 Benign neoplasm of meninges, unspecified: Secondary | ICD-10-CM

## 2022-02-22 DIAGNOSIS — S90512A Abrasion, left ankle, initial encounter: Secondary | ICD-10-CM | POA: Insufficient documentation

## 2022-02-22 DIAGNOSIS — I10 Essential (primary) hypertension: Secondary | ICD-10-CM

## 2022-02-22 DIAGNOSIS — E782 Mixed hyperlipidemia: Secondary | ICD-10-CM | POA: Diagnosis not present

## 2022-02-22 DIAGNOSIS — M159 Polyosteoarthritis, unspecified: Secondary | ICD-10-CM | POA: Insufficient documentation

## 2022-02-22 DIAGNOSIS — G894 Chronic pain syndrome: Secondary | ICD-10-CM

## 2022-02-22 DIAGNOSIS — R7303 Prediabetes: Secondary | ICD-10-CM

## 2022-02-22 DIAGNOSIS — Z0000 Encounter for general adult medical examination without abnormal findings: Secondary | ICD-10-CM

## 2022-02-22 DIAGNOSIS — F418 Other specified anxiety disorders: Secondary | ICD-10-CM

## 2022-02-22 DIAGNOSIS — F32A Depression, unspecified: Secondary | ICD-10-CM

## 2022-02-22 DIAGNOSIS — D8989 Other specified disorders involving the immune mechanism, not elsewhere classified: Secondary | ICD-10-CM

## 2022-02-22 DIAGNOSIS — I25119 Atherosclerotic heart disease of native coronary artery with unspecified angina pectoris: Secondary | ICD-10-CM

## 2022-02-22 MED ORDER — KETOROLAC TROMETHAMINE 30 MG/ML IJ SOLN
30.0000 mg | Freq: Once | INTRAMUSCULAR | Status: AC
  Administered 2022-02-22: 30 mg via INTRAMUSCULAR

## 2022-02-22 NOTE — Assessment & Plan Note (Signed)
Well controled on current meds 

## 2022-02-22 NOTE — Assessment & Plan Note (Signed)
Not infected.  Will give tetanus booster.

## 2022-02-22 NOTE — Assessment & Plan Note (Signed)
REcheck A1C

## 2022-02-22 NOTE — Assessment & Plan Note (Signed)
>>  ASSESSMENT AND PLAN FOR HYPERTENSION, BENIGN ESSENTIAL WRITTEN ON 02/22/2022 12:44 PM BY HENSEL, Azucena Bollard, MD  Well controled on current meds.

## 2022-02-22 NOTE — Assessment & Plan Note (Signed)
Per her pain clinic

## 2022-02-22 NOTE — Progress Notes (Signed)
    SUBJECTIVE:   CHIEF COMPLAINT / HPI:   Annual exam: Lots of chronic problems.  We spent considerable time cleaning up her lengthy problem list.  Major issues Daily headaches.  Does have stress, which combined with known menigioma are understandable.   Meningioma.  Sees Dr. Ellene Route.  Has put off his MRI due to claustrophobia.   Hypertension: No chest pain or DOE.  Tolerating current meds well I have not previously addressed Kappa light chain disease on problem list.  She has not seen heme recently. Abrasion left ankle- due for tetanus. Osteoarthritis mulitple sites.  Sig pain today. Non obstructive CAD.  On statin and ASA.  No chest pain.  2nd prevention.   Needs COVID and flu shots today.  PERTINENT  PMH / PSH: Denies CP, DOE, bleeding, swelling, worrisome skin lesions.  Denies change in bowel, bladder, appetite or weight.  OBJECTIVE:   BP 132/88   Pulse 91   Ht '5\' 8"'$  (1.727 m)   Wt 217 lb 12.8 oz (98.8 kg)   LMP 09/20/2011   SpO2 98%   BMI 33.12 kg/m   Neck supple Lungs clear Cardiac RRR without m or g Ext no edema.  ASSESSMENT/PLAN:   Abrasion, left ankle, initial encounter Not infected.  Will give tetanus booster.  Preventative health care Healthy woman.  Well managed chronic problems.  No at risk behaviors.    Prediabetes REcheck A1C  Osteoarthritis, multiple sites Toradol shot today  Non-obstructive CAD Direct LDL with goal <70.  Continue statin and aspirin  Meningioma ([place]) Per Dr. Ellene Route.  Discussed taking extra xanax prior to MRI to help with anxiety.  HYPERTENSION, BENIGN ESSENTIAL Well controled on current meds.  Chronic pain Per her pain clinic    Zenia Resides, MD [place]

## 2022-02-22 NOTE — Assessment & Plan Note (Signed)
Per Dr. Ellene Route.  Discussed taking extra xanax prior to MRI to help with anxiety.

## 2022-02-22 NOTE — Assessment & Plan Note (Signed)
Direct LDL with goal <70.  Continue statin and aspirin

## 2022-02-22 NOTE — Patient Instructions (Addendum)
Today you will get Covid, flu, tetatnus and tordal. I will call with blood and urine test results. You look good.  If the blood work is also good and you don't develop any problems, I may not need to see you before I retire.   I have asked if you can be assigned to Dr. Nori Riis.   I also sent in a Hematology referral.

## 2022-02-22 NOTE — Addendum Note (Signed)
Addended by: Londell Moh T on: 02/22/2022 01:04 PM   Modules accepted: Orders

## 2022-02-22 NOTE — Assessment & Plan Note (Signed)
Healthy woman.  Well managed chronic problems.  No at risk behaviors.

## 2022-02-22 NOTE — Assessment & Plan Note (Signed)
Toradol shot today

## 2022-02-25 LAB — MICROALBUMIN / CREATININE URINE RATIO
Creatinine, Urine: 160.5 mg/dL
Microalb/Creat Ratio: 6 mg/g creat (ref 0–29)
Microalbumin, Urine: 9.5 ug/mL

## 2022-02-25 LAB — HEMOGLOBIN A1C
Est. average glucose Bld gHb Est-mCnc: 148 mg/dL
Hgb A1c MFr Bld: 6.8 % — ABNORMAL HIGH (ref 4.8–5.6)

## 2022-02-25 LAB — LDL CHOLESTEROL, DIRECT: LDL Direct: 53 mg/dL (ref 0–99)

## 2022-02-25 MED ORDER — ALPRAZOLAM 1 MG PO TABS: 1.0000 mg | ORAL_TABLET | Freq: Three times a day (TID) | ORAL | 5 refills | Status: DC

## 2022-02-25 NOTE — Addendum Note (Signed)
Addended by: Zenia Resides on: 02/25/2022 01:27 PM   Modules accepted: Orders

## 2022-02-27 ENCOUNTER — Other Ambulatory Visit: Payer: Self-pay | Admitting: Family Medicine

## 2022-02-27 DIAGNOSIS — Z1231 Encounter for screening mammogram for malignant neoplasm of breast: Secondary | ICD-10-CM

## 2022-04-01 ENCOUNTER — Telehealth: Payer: Self-pay | Admitting: Family Medicine

## 2022-04-01 NOTE — Telephone Encounter (Signed)
Patient dropped off application for renewal of disability Parking placard.  PCP needs to make sure he addresses#[phone] at the bottom which is highlighted.  Put in green folder.

## 2022-04-02 NOTE — Telephone Encounter (Signed)
Clinical info completed on Handicap Placard form.  Placed form in Dr Lowella Bandy box for completion.    When form is completed, please route note to "RN Team" and place in wall pocket in front office.   Ottis Stain, CMA

## 2022-04-02 NOTE — Telephone Encounter (Signed)
Form completed and placed in RN box

## 2022-04-03 NOTE — Telephone Encounter (Signed)
Patient called and informed that forms are ready for pick up. Copy made and placed in batch scanning. Original placed at front desk for pick up.   Jamaurion Slemmer C Johara Lodwick, RN  

## 2022-04-18 ENCOUNTER — Other Ambulatory Visit: Payer: Self-pay | Admitting: Family Medicine

## 2022-04-18 DIAGNOSIS — G8929 Other chronic pain: Secondary | ICD-10-CM

## 2022-04-18 DIAGNOSIS — G4700 Insomnia, unspecified: Secondary | ICD-10-CM

## 2022-04-23 ENCOUNTER — Ambulatory Visit
Admission: RE | Admit: 2022-04-23 | Discharge: 2022-04-23 | Disposition: A | Discharge status: Home / Self Care | Payer: Medicare HMO | Source: Ambulatory Visit | Attending: Family Medicine | Admitting: Family Medicine

## 2022-04-23 DIAGNOSIS — Z1231 Encounter for screening mammogram for malignant neoplasm of breast: Secondary | ICD-10-CM

## 2022-04-30 ENCOUNTER — Encounter: Payer: Self-pay | Admitting: Family Medicine

## 2022-05-16 ENCOUNTER — Other Ambulatory Visit: Payer: Self-pay

## 2022-05-16 DIAGNOSIS — R110 Nausea: Secondary | ICD-10-CM

## 2022-05-18 MED ORDER — PROMETHAZINE HCL 25 MG RE SUPP: 25.0000 mg | Freq: Four times a day (QID) | RECTAL | 2 refills | Status: DC | PRN

## 2022-06-09 ENCOUNTER — Other Ambulatory Visit: Payer: Self-pay | Admitting: Physician Assistant

## 2022-07-31 ENCOUNTER — Encounter: Payer: Self-pay | Admitting: Family Medicine

## 2022-07-31 ENCOUNTER — Ambulatory Visit (INDEPENDENT_AMBULATORY_CARE_PROVIDER_SITE_OTHER): Payer: Medicare HMO | Admitting: Family Medicine

## 2022-07-31 VITALS — BP 126/84 | HR 98 | Ht 68.0 in | Wt 221.0 lb

## 2022-07-31 DIAGNOSIS — R7303 Prediabetes: Secondary | ICD-10-CM

## 2022-07-31 DIAGNOSIS — E669 Obesity, unspecified: Secondary | ICD-10-CM | POA: Diagnosis not present

## 2022-07-31 DIAGNOSIS — I10 Essential (primary) hypertension: Secondary | ICD-10-CM

## 2022-07-31 DIAGNOSIS — R600 Localized edema: Secondary | ICD-10-CM

## 2022-07-31 DIAGNOSIS — E66812 Obesity, class 2: Secondary | ICD-10-CM

## 2022-07-31 LAB — POCT GLYCOSYLATED HEMOGLOBIN (HGB A1C): HbA1c, POC (controlled diabetic range): 6.8 % (ref 0.0–7.0)

## 2022-07-31 MED ORDER — FUROSEMIDE 20 MG PO TABS: 20.0000 mg | ORAL_TABLET | Freq: Every day | ORAL | 3 refills | Status: DC

## 2022-07-31 NOTE — Assessment & Plan Note (Signed)
>>  ASSESSMENT AND PLAN FOR HYPERTENSION, BENIGN ESSENTIAL WRITTEN ON 07/31/2022  4:08 PM BY Ladonne Sharples L, MD  Good control.  Will continue current medications.  Will check labs.

## 2022-07-31 NOTE — Assessment & Plan Note (Addendum)
I think this most consistent with some mild venous stasis.  She did have symptom relief using as needed Lasix previously so we will try that again.  Will check labs today and then I will see her back in about 6 weeks.  Would recheck any abnormalities then.  She is on amlodipine which can have some side effects of lower extremity edema but has been on it quite a long time and seems to be working well for her blood pressure.  Will reevaluate her edema when I see her back.

## 2022-07-31 NOTE — Assessment & Plan Note (Signed)
Good control.  Will continue current medications.  Will check labs.

## 2022-07-31 NOTE — Progress Notes (Signed)
    CHIEF COMPLAINT / HPI: #1.  Concerned about some lower extremity swelling.  Gets worse as the day goes on.  Even if she wears compression stocks, she still has some indentations in her legs and her fingers feel somewhat tight the latter part of the day.  She had previously been on diuretic as needed but the technician at the blood donation center evidently told her to stop that so she did. 2.  Here to check on her diagnosis of prediabetes.  She has been trying to follow a really good diet and exercise plan. 3.  Taking her blood pressure medicine regularly.  No problems with that.  No chest pains or shortness of breath.   PERTINENT  PMH / PSH: I have reviewed the patient's medications, allergies, past medical and surgical history, smoking status and updated in the EMR as appropriate.   OBJECTIVE:  BP 126/84   Pulse 98   Ht 5\' 8"  (1.727 m)   Wt 221 lb (100.2 kg)   LMP 09/20/2011   SpO2 94%   BMI 33.60 kg/m   Vital signs reviewed. GENERAL: Well-developed, well-nourished, no acute distress. CARDIOVASCULAR: Regular rate and rhythm no murmur gallop or rub LUNGS: Clear to auscultation bilaterally, no rales or wheeze. ABDOMEN: Soft positive bowel sounds NEURO: No gross focal neurological deficits. MSK: Movement of extremity x 4.  ASSESSMENT / PLAN:   Prediabetes Checked her A1c today.  Looks like she is holding stable.  Will follow this up in a few months.  Congratulated on her work.  We discussed exercise and weight loss.  HYPERTENSION, BENIGN ESSENTIAL Good control.  Will continue current medications.  Will check labs.  Lower extremity edema I think this most consistent with some mild venous stasis.  She did have symptom relief using as needed Lasix previously so we will try that again.  Will check labs today and then I will see her back in about 6 weeks.  Would recheck any abnormalities then.  She is on amlodipine which can have some side effects of lower extremity edema but  has been on it quite a long time and seems to be working well for her blood pressure.  Will reevaluate her edema when I see her back.  Obesity, Class II, BMI 35-39.9 We talked about diet and exercise.   Denny Levy MD

## 2022-07-31 NOTE — Assessment & Plan Note (Signed)
We talked about diet and exercise.

## 2022-07-31 NOTE — Assessment & Plan Note (Signed)
>>  ASSESSMENT AND PLAN FOR LOWER EXTREMITY EDEMA WRITTEN ON 07/31/2022  4:10 PM BY Nickolaus Bordelon L, MD  I think this most consistent with some mild venous stasis.  She did have symptom relief using as needed Lasix  previously so we will try that again.  Will check labs today and then I will see her back in about 6 weeks.  Would recheck any abnormalities then.  She is on amlodipine  which can have some side effects of lower extremity edema but has been on it quite a long time and seems to be working well for her blood pressure.  Will reevaluate her edema when I see her back.

## 2022-07-31 NOTE — Assessment & Plan Note (Signed)
Checked her A1c today.  Looks like she is holding stable.  Will follow this up in a few months.  Congratulated on her work.  We discussed exercise and weight loss.

## 2022-08-01 LAB — BASIC METABOLIC PANEL
BUN/Creatinine Ratio: 23 (ref 12–28)
BUN: 25 mg/dL (ref 8–27)
CO2: 19 mmol/L — ABNORMAL LOW (ref 20–29)
Calcium: 9 mg/dL (ref 8.7–10.3)
Chloride: 105 mmol/L (ref 96–106)
Creatinine, Ser: 1.09 mg/dL — ABNORMAL HIGH (ref 0.57–1.00)
Glucose: 114 mg/dL — ABNORMAL HIGH (ref 70–99)
Potassium: 4.2 mmol/L (ref 3.5–5.2)
Sodium: 143 mmol/L (ref 134–144)
eGFR: 57 mL/min/{1.73_m2} — ABNORMAL LOW (ref >59)

## 2022-08-02 ENCOUNTER — Encounter: Payer: Self-pay | Admitting: Family Medicine

## 2022-08-07 ENCOUNTER — Telehealth: Payer: Self-pay

## 2022-08-07 NOTE — Telephone Encounter (Signed)
Patient calls nurse line in regards to A1c results.   We discussed diet and exercise as ways to lower A1c result. She reports she does not want be on medications for diabetes now or in the future.    Advised we can recheck her A1c in 3 months.

## 2022-08-13 ENCOUNTER — Other Ambulatory Visit: Payer: Self-pay

## 2022-08-13 DIAGNOSIS — F32A Depression, unspecified: Secondary | ICD-10-CM

## 2022-08-13 DIAGNOSIS — F418 Other specified anxiety disorders: Secondary | ICD-10-CM

## 2022-08-13 MED ORDER — ALPRAZOLAM 1 MG PO TABS: 1.0000 mg | ORAL_TABLET | Freq: Three times a day (TID) | ORAL | 5 refills | Status: DC

## 2022-08-13 NOTE — Telephone Encounter (Signed)
I have sent in replacement Rx [facility]

## 2022-08-13 NOTE — Telephone Encounter (Signed)
Patient calls nurse line in regards to Xanax prescription.   She reports Dr. Leveda Anna sent her in plenty of refills in December, however since he is retired they can no longer use his DEA and al remaining refills have been discarded.   Patient is requesting new PCP send in prescription.   Will forward to [place].

## 2022-08-15 MED ORDER — ALPRAZOLAM 1 MG PO TABS: 1.0000 mg | ORAL_TABLET | Freq: Three times a day (TID) | ORAL | 5 refills | Status: DC

## 2022-08-15 NOTE — Addendum Note (Signed)
Addended byDenny Levy L on: 08/15/2022 12:07 PM   Modules accepted: Orders

## 2022-08-15 NOTE — Telephone Encounter (Signed)
Patient calls nurse line regarding alprazolam refill.   This was sent to [facility] pharmacy. Patient states that she needs this to go to the local pharmacy, [facility].   Called and canceled at [facility]. Will forward to PCP to resend to [facility].   Veronda Prude, RN

## 2022-08-15 NOTE — Addendum Note (Signed)
Addended by: Veronda Prude on: 08/15/2022 10:42 AM   Modules accepted: Orders

## 2022-08-21 ENCOUNTER — Other Ambulatory Visit: Payer: Self-pay | Admitting: Physician Assistant

## 2022-08-22 ENCOUNTER — Other Ambulatory Visit: Payer: Self-pay

## 2022-08-22 MED ORDER — IBUPROFEN 800 MG PO TABS: 800.0000 mg | ORAL_TABLET | Freq: Three times a day (TID) | ORAL | 3 refills | Status: DC | PRN

## 2022-09-12 ENCOUNTER — Other Ambulatory Visit: Payer: Self-pay | Admitting: Physician Assistant

## 2022-09-13 ENCOUNTER — Other Ambulatory Visit: Payer: Self-pay

## 2022-09-13 DIAGNOSIS — I10 Essential (primary) hypertension: Secondary | ICD-10-CM

## 2022-09-13 DIAGNOSIS — E782 Mixed hyperlipidemia: Secondary | ICD-10-CM

## 2022-09-16 MED ORDER — ROSUVASTATIN CALCIUM 40 MG PO TABS: 40.0000 mg | ORAL_TABLET | Freq: Every day | ORAL | 3 refills | Status: DC

## 2022-09-16 MED ORDER — PANTOPRAZOLE SODIUM 40 MG PO TBEC: 40.0000 mg | DELAYED_RELEASE_TABLET | Freq: Every day | ORAL | 0 refills | Status: DC

## 2022-09-16 MED ORDER — AMLODIPINE BESYLATE 5 MG PO TABS: 5.0000 mg | ORAL_TABLET | Freq: Every day | ORAL | 3 refills | Status: DC

## 2022-09-24 ENCOUNTER — Ambulatory Visit (INDEPENDENT_AMBULATORY_CARE_PROVIDER_SITE_OTHER): Payer: Medicare HMO | Admitting: *Deleted

## 2022-09-24 DIAGNOSIS — Z0000 Encounter for general adult medical examination without abnormal findings: Secondary | ICD-10-CM

## 2022-09-24 NOTE — Patient Instructions (Signed)
[name] , Thank you for taking time to come for your Medicare Wellness Visit. I appreciate your ongoing commitment to your health goals. Please review the following plan we discussed and let me know if I can assist you in the future.   Screening recommendations/referrals: Colonoscopy: up to date Mammogram: up to date Bone Density: Education provided Recommended yearly ophthalmology/optometry visit for glaucoma screening and checkup Recommended yearly dental visit for hygiene and checkup  Vaccinations: Influenza vaccine: up to date Pneumococcal vaccine: Education provided Tdap vaccine: up to date Shingles vaccine: up to date    Advanced directives: Education provided   Preventive Care 65 Years and Older, Female Preventive care refers to lifestyle choices and visits with your health care provider that can promote health and wellness. What does preventive care include? A yearly physical exam. This is also called an annual well check. Dental exams once or twice a year. Routine eye exams. Ask your health care provider how often you should have your eyes checked. Personal lifestyle choices, including: Daily care of your teeth and gums. Regular physical activity. Eating a healthy diet. Avoiding tobacco and drug use. Limiting alcohol use. Practicing safe sex. Taking low-dose aspirin every day. Taking vitamin and mineral supplements as recommended by your health care provider. What happens during an annual well check? The services and screenings done by your health care provider during your annual well check will depend on your age, overall health, lifestyle risk factors, and family history of disease. Counseling  Your health care provider may ask you questions about your: Alcohol use. Tobacco use. Drug use. Emotional well-being. Home and relationship well-being. Sexual activity. Eating habits. History of falls. Memory and ability to understand (cognition). Work and work  Astronomer. Reproductive health. Screening  You may have the following tests or measurements: Height, weight, and BMI. Blood pressure. Lipid and cholesterol levels. These may be checked every 5 years, or more frequently if you are over 54 years old. Skin check. Lung cancer screening. You may have this screening every year starting at age 100 if you have a 30-pack-year history of smoking and currently smoke or have quit within the past 15 years. Fecal occult blood test (FOBT) of the stool. You may have this test every year starting at age 64. Flexible sigmoidoscopy or colonoscopy. You may have a sigmoidoscopy every 5 years or a colonoscopy every 10 years starting at age 40. Hepatitis C blood test. Hepatitis B blood test. Sexually transmitted disease (STD) testing. Diabetes screening. This is done by checking your blood sugar (glucose) after you have not eaten for a while (fasting). You may have this done every 1-3 years. Bone density scan. This is done to screen for osteoporosis. You may have this done starting at age 72. Mammogram. This may be done every 1-2 years. Talk to your health care provider about how often you should have regular mammograms. Talk with your health care provider about your test results, treatment options, and if necessary, the need for more tests. Vaccines  Your health care provider may recommend certain vaccines, such as: Influenza vaccine. This is recommended every year. Tetanus, diphtheria, and acellular pertussis (Tdap, Td) vaccine. You may need a Td booster every 10 years. Zoster vaccine. You may need this after age 2. Pneumococcal 13-valent conjugate (PCV13) vaccine. One dose is recommended after age 23. Pneumococcal polysaccharide (PPSV23) vaccine. One dose is recommended after age 76. Talk to your health care provider about which screenings and vaccines you need and how often you need  them. This information is not intended to replace advice given to you by  your health care provider. Make sure you discuss any questions you have with your health care provider. Document Released: 04/07/2015 Document Revised: 11/29/2015 Document Reviewed: 01/10/2015 Elsevier Interactive Patient Education  2017 [place] Prevention in the Home Falls can cause injuries. They can happen to people of all ages. There are many things you can do to make your home safe and to help prevent falls. What can I do on the outside of my home? Regularly fix the edges of walkways and driveways and fix any cracks. Remove anything that might make you trip as you walk through a door, such as a raised step or threshold. Trim any bushes or trees on the path to your home. Use bright outdoor lighting. Clear any walking paths of anything that might make someone trip, such as rocks or tools. Regularly check to see if handrails are loose or broken. Make sure that both sides of any steps have handrails. Any raised decks and porches should have guardrails on the edges. Have any leaves, snow, or ice cleared regularly. Use sand or salt on walking paths during winter. Clean up any spills in your garage right away. This includes oil or grease spills. What can I do in the bathroom? Use night lights. Install grab bars by the toilet and in the tub and shower. Do not use towel bars as grab bars. Use non-skid mats or decals in the tub or shower. If you need to sit down in the shower, use a plastic, non-slip stool. Keep the floor dry. Clean up any water that spills on the floor as soon as it happens. Remove soap buildup in the tub or shower regularly. Attach bath mats securely with double-sided non-slip rug tape. Do not have throw rugs and other things on the floor that can make you trip. What can I do in the bedroom? Use night lights. Make sure that you have a light by your bed that is easy to reach. Do not use any sheets or blankets that are too big for your bed. They should not hang  down onto the floor. Have a firm chair that has side arms. You can use this for support while you get dressed. Do not have throw rugs and other things on the floor that can make you trip. What can I do in the kitchen? Clean up any spills right away. Avoid walking on wet floors. Keep items that you use a lot in easy-to-reach places. If you need to reach something above you, use a strong step stool that has a grab bar. Keep electrical cords out of the way. Do not use floor polish or wax that makes floors slippery. If you must use wax, use non-skid floor wax. Do not have throw rugs and other things on the floor that can make you trip. What can I do with my stairs? Do not leave any items on the stairs. Make sure that there are handrails on both sides of the stairs and use them. Fix handrails that are broken or loose. Make sure that handrails are as long as the stairways. Check any carpeting to make sure that it is firmly attached to the stairs. Fix any carpet that is loose or worn. Avoid having throw rugs at the top or bottom of the stairs. If you do have throw rugs, attach them to the floor with carpet tape. Make sure that you have a light switch at  the top of the stairs and the bottom of the stairs. If you do not have them, ask someone to add them for you. What else can I do to help prevent falls? Wear shoes that: Do not have high heels. Have rubber bottoms. Are comfortable and fit you well. Are closed at the toe. Do not wear sandals. If you use a stepladder: Make sure that it is fully opened. Do not climb a closed stepladder. Make sure that both sides of the stepladder are locked into place. Ask someone to hold it for you, if possible. Clearly mark and make sure that you can see: Any grab bars or handrails. First and last steps. Where the edge of each step is. Use tools that help you move around (mobility aids) if they are needed. These  include: Canes. Walkers. Scooters. Crutches. Turn on the lights when you go into a dark area. Replace any light bulbs as soon as they burn out. Set up your furniture so you have a clear path. Avoid moving your furniture around. If any of your floors are uneven, fix them. If there are any pets around you, be aware of where they are. Review your medicines with your doctor. Some medicines can make you feel dizzy. This can increase your chance of falling. Ask your doctor what other things that you can do to help prevent falls. This information is not intended to replace advice given to you by your health care provider. Make sure you discuss any questions you have with your health care provider. Document Released: 01/05/2009 Document Revised: 08/17/2015 Document Reviewed: 04/15/2014 Elsevier Interactive Patient Education  2017 [employer].
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
## 2022-10-02 ENCOUNTER — Ambulatory Visit: Payer: Medicare HMO | Admitting: Family Medicine

## 2022-10-08 ENCOUNTER — Other Ambulatory Visit: Payer: Self-pay | Admitting: Family Medicine

## 2022-10-22 ENCOUNTER — Telehealth: Payer: Self-pay

## 2022-10-22 NOTE — Telephone Encounter (Signed)
-----   Message from Denny Levy sent at 10/22/2022  9:58 AM EDT ----- Unfortunately I am out of office this week so she should schedule with someone else. Maybe Dr. Miquel Dunn or Dr. Linwood Dibbles? Sorry, but best I can do. I can call her next week but sounds like she needs to be seen. THANKS! Denny Levy ----- Message ----- From: Glennie Hawk, CMA Sent: 10/21/2022   5:34 PM EDT To: Nestor Ramp, MD  [name] Dr. Jennette Kettle, [facility]) is having a lot of swelling and retaining fluid.  Her sister Albin Felling says that she needs to be seen ASAP.  You have no availability until late August.  She wants and needs to come in sooner and only wants to see you.  I told Albin Felling that I would let you know.  Pleases advise.  Glennie Hawk, CMA

## 2022-10-22 NOTE — Telephone Encounter (Signed)
Patient scheduled for appointment on  Friday 10/25/2022 with Dr. Melissa Noon.  Patient is aware.  Glennie Hawk, CMA

## 2022-10-25 ENCOUNTER — Ambulatory Visit (INDEPENDENT_AMBULATORY_CARE_PROVIDER_SITE_OTHER): Payer: Medicare HMO | Admitting: Student

## 2022-10-25 VITALS — BP 137/91 | HR 93 | Wt 225.8 lb

## 2022-10-25 DIAGNOSIS — R600 Localized edema: Secondary | ICD-10-CM

## 2022-10-25 DIAGNOSIS — R609 Edema, unspecified: Secondary | ICD-10-CM

## 2022-10-25 NOTE — Progress Notes (Signed)
    SUBJECTIVE:   CHIEF COMPLAINT / HPI:   Shannon Stuart is a 62 year old female here to discuss abdominal swelling.  She noticed this started months ago and so she was started her on Lasix 20 mg daily, which has increased her urine output a little bit but has been minimal in reducing her swelling in her legs.  She has any shortness of breath, or orthopnea.  No dyspnea on exertion.   At night, she sleeps with 1 pillow and sleeps flat without difficulty.  She did share with me that her younger sister was recently in the hospital for CHF exacerbation at 20%. Mom died at 52 from heart attack.  No fevers/chills. Does not have known history of anemia.  Not a cigarette smoker. Alcohol intake- socially   PERTINENT  PMH / PSH: Lower extremity edema, chronic pain, depression, hypertension, history of colonic polyps  OBJECTIVE:   BP (!) 137/91   Pulse 93   Wt 225 lb 12.8 oz (102.4 kg)   LMP 09/20/2011   SpO2 100%   BMI 34.33 kg/m   General: Pleasant, ambulates without assistance, in good spirits CV: Regular rate and rhythm, no murmurs appreciated Respiratory: Normal work of breathing on room air with very minimal bibasilar crackles Abdomen: Soft Extremities: 1+ pitting edema bilaterally up to mid shin with sock indentations and ankles  ASSESSMENT/PLAN:   Bilateral lower extremity edema Generally well-appearing 62 year old female with worsening lower extremity swelling and edema, 1+ pitting on exam today.  Reassuringly, vital signs within normal limits and no increased work of breathing or hypoxia. Reviewed echo from 2019 showing grade 1 diastolic dysfunction, LVEF 55-60%. Given increase in lower extremity edema with minimal response to Lasix 20 mg, plan to treat this as an outpatient CHF exacerbation and increase Lasix to 40 mg daily over the next few days.  Patient will let me know if her swelling improves. -Ordered repeat echocardiogram -Will also assess for other potential  etiologies of edema including anemia (CBC today) and thyroid dysfunction) TSH today) -Also discussed likely contribution with dependent edema, encouraged to wear compression stockings -Return precautions discussed should she develop any dyspnea or difficulty with shortness of breath, to go to the ED at that point   Darral Dash, DO [facility]

## 2022-10-25 NOTE — Assessment & Plan Note (Signed)
Generally well-appearing 62 year old female with worsening lower extremity swelling and edema, 1+ pitting on exam today.  Reassuringly, vital signs within normal limits and no increased work of breathing or hypoxia. Reviewed echo from 2019 showing grade 1 diastolic dysfunction, LVEF 55-60%. Given increase in lower extremity edema with minimal response to Lasix 20 mg, plan to treat this as an outpatient CHF exacerbation and increase Lasix to 40 mg daily over the next few days.  Patient will let me know if her swelling improves. -Ordered repeat echocardiogram -Will also assess for other potential etiologies of edema including anemia (CBC today) and thyroid dysfunction) TSH today) -Also discussed likely contribution with dependent edema, encouraged to wear compression stockings -Return precautions discussed should she develop any dyspnea or difficulty with shortness of breath, to go to the ED at that point

## 2022-10-25 NOTE — Patient Instructions (Addendum)
It was great seeing you today.  As we discussed, - Your swelling could be due to multiple different things. Most commonly, dependent edema (gravity causing swelling in the legs) is part of the cause.  - We are going to check labs today to make sure there is not another cause (kidney problem, anemia). - I ordered an echocardiogram (ultrasound of your heart) to check the pump function. - Increase your Lasix to 40 mg daily over the weekend- let me know on Monday if your swelling is improved.   If you have any questions or concerns, please feel free to call the clinic.   Have a wonderful day,  Dr. Darral Dash [facility] [phone]

## 2022-10-28 ENCOUNTER — Telehealth: Payer: Self-pay

## 2022-10-28 NOTE — Telephone Encounter (Signed)
Patient calls nurse line regarding continued edema.   She reports that there have been no improvements, swelling is "basically the same" as it was on Friday.   She reports normal breathing. No shortness of breath, difficulty breathing or chest pain.   Will forward to Dr. Melissa Noon (saw patient on 10/25/22) for further advisement.  ED precautions discussed.    *Patient is also asking about status of ECHO. Will forward to [facility] to check and see if this needs authorization.   Veronda Prude, RN

## 2022-10-29 NOTE — Telephone Encounter (Signed)
Returned call to patient regarding lack of improvement in swelling with increasing Lasix from 20 to 40 mg daily.  Has yet to get her echocardiogram completed, but this is to be scheduled since insurance has authorized it.  No shortness of breath, PND or orthopnea.  Discussed ED return precautions should she develop any of these.  Explained that there are multiple causes of edema that we discussed in her office visit (reassuringly bloodwork from our visit showed TSH is within normal limits,Hgb 12.5, BNP was within normal limits) and so she very likely may have underlying dependent edema.  Will await echocardiogram results and go from there.  Darral Dash, DO

## 2022-10-29 NOTE — Telephone Encounter (Signed)
ECHO has been authorized. ([id number]) They should be calling to schedule ECHO with pt. I will call pt today to let her know. Sunday Spillers, CMA

## 2022-10-30 ENCOUNTER — Other Ambulatory Visit: Payer: Self-pay | Admitting: Family Medicine

## 2022-10-31 ENCOUNTER — Ambulatory Visit (HOSPITAL_COMMUNITY)
Admission: RE | Admit: 2022-10-31 | Discharge: 2022-10-31 | Disposition: A | Discharge status: Home / Self Care | Payer: Medicare HMO | Source: Ambulatory Visit | Attending: Family Medicine | Admitting: Family Medicine

## 2022-10-31 DIAGNOSIS — I2510 Atherosclerotic heart disease of native coronary artery without angina pectoris: Secondary | ICD-10-CM | POA: Insufficient documentation

## 2022-10-31 DIAGNOSIS — I5031 Acute diastolic (congestive) heart failure: Secondary | ICD-10-CM | POA: Diagnosis not present

## 2022-10-31 DIAGNOSIS — Z87891 Personal history of nicotine dependence: Secondary | ICD-10-CM | POA: Insufficient documentation

## 2022-10-31 DIAGNOSIS — R609 Edema, unspecified: Secondary | ICD-10-CM | POA: Insufficient documentation

## 2022-10-31 LAB — ECHOCARDIOGRAM COMPLETE
AR max vel: 2.37 cm2
AV Area VTI: 2.23 cm2
AV Area mean vel: 2.2 cm2
AV Mean grad: 3 mmHg
AV Peak grad: 5.7 mmHg
Ao pk vel: 1.19 m/s
Area-P 1/2: 3.99 cm2
S' Lateral: 2.3 cm

## 2022-11-05 ENCOUNTER — Telehealth: Payer: Self-pay

## 2022-11-05 NOTE — Telephone Encounter (Signed)
Patient calls nurse line requesting a prescription for Lidocaine 5% patches.   She reports shoulder and knee pain. She reports she has tried Voltaren and OTC Lidocaine patches with no relief.   Please send to [employer].   Will forward to PCP.

## 2022-11-07 MED ORDER — LIDOCAINE 5 % EX PTCH: MEDICATED_PATCH | CUTANEOUS | 2 refills | Status: DC

## 2022-11-07 NOTE — Telephone Encounter (Signed)
Rx sent 

## 2022-11-19 ENCOUNTER — Other Ambulatory Visit: Payer: Self-pay

## 2022-11-19 DIAGNOSIS — G8929 Other chronic pain: Secondary | ICD-10-CM

## 2022-11-19 DIAGNOSIS — I10 Essential (primary) hypertension: Secondary | ICD-10-CM

## 2022-11-20 MED ORDER — LISINOPRIL 40 MG PO TABS
40.0000 mg | ORAL_TABLET | Freq: Every day | ORAL | 3 refills | Status: DC
Start: 2022-11-20 — End: 2023-08-06

## 2022-11-20 MED ORDER — AMLODIPINE BESYLATE 5 MG PO TABS
5.0000 mg | ORAL_TABLET | Freq: Every day | ORAL | 3 refills | Status: DC
Start: 2022-11-20 — End: 2023-08-07

## 2022-11-20 MED ORDER — CYCLOBENZAPRINE HCL 10 MG PO TABS: 10.0000 mg | ORAL_TABLET | Freq: Three times a day (TID) | ORAL | 3 refills | Status: DC

## 2022-11-22 ENCOUNTER — Telehealth: Payer: Self-pay

## 2022-11-22 NOTE — Telephone Encounter (Signed)
Patient calls nurse line reporting continued LE swelling.   She has been alternating taking lasix 20mg  and 40mg  each day. She reports no "real" improvement in swelling and reports pain.   She reports she has been wearing her compression stocking "occasionally" and elevating her legs.   She denies any SOB or increased SOB with exertion. She denies any chest pains.   Patient scheduled for 9/3 for evaluation.   Strict ED precautions given to patient.   Will forward to provider who saw patient for concern on 8/2.

## 2022-11-25 NOTE — Progress Notes (Unsigned)
    SUBJECTIVE:   CHIEF COMPLAINT / HPI: foot swelling  Having swelling for 2 months. Not improving with Lasix. Alternating 20mg  of Lasix and 40mg  of Lasix every other day. Does not noticed increase in urination with 20mg  or 40mg . Using compression stockings every other night. No chest pain or difficulty breathing.  Taking Lisinopril. Taking potassium daily.   PERTINENT  PMH / PSH: HTN, CAD, Light chain disease  OBJECTIVE:   BP 119/83   Pulse 83   Ht 5\' 8"  (1.727 m)   Wt 224 lb (101.6 kg)   LMP 09/20/2011   SpO2 97%   BMI 34.06 kg/m   General: NAD  Neuro: A&O Cardiovascular: RRR, no murmurs, 1+ peripheral edema bilaterally Respiratory: normal WOB on RA, CTAB, no wheezes, ronchi or rales Extremities: Moving all 4 extremities equally, no erythema, DP pulses 2+   ASSESSMENT/PLAN:   Venous insufficiency Assessment & Plan: Concern for heart failure exacerbation 1 month ago at previous visit.  BNP was normal and ECHO showed grade 1 diastolic dysfunction and normal EF.  Patient without evidence of volume overload outside of peripheral edema today.    I suspect patient swelling is likely due to venous insufficiency not volume overload due to heart failure.  No red flag symptoms to suggest DVT or infection.  Discussed this with patient, ordered low pressure, graduated fitted compression stockings for patient.  Provided instructions to take to medical supply store for fitting.  Can consider increasing Lasix to 60 mg daily if patient continues to have swelling despite compression stockings.  Orders: -     Compression stockings   Return in about 1 month (around 12/26/2022).  Celine Mans, MD [facility]

## 2022-11-26 ENCOUNTER — Ambulatory Visit (INDEPENDENT_AMBULATORY_CARE_PROVIDER_SITE_OTHER): Payer: Medicare HMO | Admitting: Family Medicine

## 2022-11-26 ENCOUNTER — Encounter: Payer: Self-pay | Admitting: Family Medicine

## 2022-11-26 VITALS — BP 119/83 | HR 83 | Ht 68.0 in | Wt 224.0 lb

## 2022-11-26 DIAGNOSIS — I872 Venous insufficiency (chronic) (peripheral): Secondary | ICD-10-CM | POA: Diagnosis not present

## 2022-11-26 NOTE — Assessment & Plan Note (Signed)
>>  ASSESSMENT AND PLAN FOR VENOUS INSUFFICIENCY WRITTEN ON 11/26/2022 10:53 AM BY Ivin Marrow, MD  Concern for heart failure exacerbation 1 month ago at previous visit.  BNP was normal and ECHO showed grade 1 diastolic dysfunction and normal EF.  Patient without evidence of volume overload outside of peripheral edema today.    I suspect patient swelling is likely due to venous insufficiency not volume overload due to heart failure.  No red flag symptoms to suggest DVT or infection.  Discussed this with patient, ordered low pressure, graduated fitted compression stockings for patient.  Provided instructions to take to medical supply store for fitting.  Can consider increasing Lasix  to 60 mg daily if patient continues to have swelling despite compression stockings.

## 2022-11-26 NOTE — Assessment & Plan Note (Addendum)
Concern for heart failure exacerbation 1 month ago at previous visit.  BNP was normal and ECHO showed grade 1 diastolic dysfunction and normal EF.  Patient without evidence of volume overload outside of peripheral edema today.    I suspect patient swelling is likely due to venous insufficiency not volume overload due to heart failure.  No red flag symptoms to suggest DVT or infection.  Discussed this with patient, ordered low pressure, graduated fitted compression stockings for patient.  Provided instructions to take to medical supply store for fitting.  Can consider increasing Lasix to 60 mg daily if patient continues to have swelling despite compression stockings.

## 2022-11-26 NOTE — Patient Instructions (Addendum)
It was great to see you! Thank you for allowing me to participate in your care!  Our plans for today:  - I have order and printed compression stockings for you. Please take this to a medical supply store. - I recommend [employer] store on [address]. - If have any other concerns please message or call. I have reached out to Dr. Jennette Kettle to see if your Lasix should be increased.   Please arrive 15 minutes PRIOR to your next scheduled appointment time! If you do not, this affects OTHER patients' care.  Take care and seek immediate care sooner if you develop any concerns.   Celine Mans, MD, PGY-2 [facility] 9:05 AM 11/26/2022  [facility]

## 2022-12-13 ENCOUNTER — Ambulatory Visit (INDEPENDENT_AMBULATORY_CARE_PROVIDER_SITE_OTHER): Payer: Medicare HMO | Admitting: Family Medicine

## 2022-12-13 ENCOUNTER — Ambulatory Visit (HOSPITAL_COMMUNITY)
Admission: RE | Admit: 2022-12-13 | Discharge: 2022-12-13 | Disposition: A | Discharge status: Home / Self Care | Payer: Medicare HMO | Source: Ambulatory Visit | Attending: Family Medicine | Admitting: Family Medicine

## 2022-12-13 VITALS — BP 118/87 | HR 99 | Ht 68.0 in | Wt 221.2 lb

## 2022-12-13 DIAGNOSIS — M1611 Unilateral primary osteoarthritis, right hip: Secondary | ICD-10-CM | POA: Insufficient documentation

## 2022-12-13 DIAGNOSIS — M25551 Pain in right hip: Secondary | ICD-10-CM | POA: Diagnosis not present

## 2022-12-13 MED ORDER — METHYLPREDNISOLONE ACETATE 40 MG/ML IJ SUSP
40.0000 mg | Freq: Once | INTRAMUSCULAR | Status: AC
Start: 2022-12-13 — End: 2022-12-13
  Administered 2022-12-13: 40 mg via INTRAMUSCULAR

## 2022-12-13 NOTE — Assessment & Plan Note (Signed)
Patient with point tenderness over the greater trochanter as well as significant pain with resisted abduction of the right hip.  This is consistent with GTPS of right hip.  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  Patient agreeable to starting physical therapy and greater trochanteric bursa steroid injection today.  Physical therapy referral sent.

## 2022-12-13 NOTE — Progress Notes (Signed)
    SUBJECTIVE:   CHIEF COMPLAINT / HPI: hip pain  Ongoing for several week.  Localized to her lumbar back lateral hip. Radiates down leg to knee. Worse with walking. Also has pain in right sided groin. No falls or trauma.  Feels she did a split in the store 1 month ago, did not fall. No new numbness or tingling, has chronic from back. Prior history of left-sided hip replacement. Patient has pain both on the lateral side of her hip the anterior side and her lumbar area on the right side.  PERTINENT  PMH / PSH: Lumbar stenosis. Hx of Laminectomy.  OBJECTIVE:   BP 118/87   Pulse 99   Ht 5\' 8"  (1.727 m)   Wt 221 lb 3.2 oz (100.3 kg)   LMP 09/20/2011   SpO2 97%   BMI 33.63 kg/m   General: NAD, well appearing Neuro: A&O Respiratory: normal WOB on RA Extremities: Moving all 4 extremities equally  Right hip Inspection: No obvious deformity, ecchymoses, swelling, erythema Palpation: Moderately tender to palpation at greater trochanter Mildly tender to palpation along right SI joint and paraspinal muscles Range of Motion: External rotation limited to 30 degrees, internal rotation limited to 15 degrees, causes anterior hip pain Special Tests Positive right-sided straight leg raise Right-sided Trendelenburg sign positive Pain with resisted abduction of right hip Strength: Strength in all directions decreased in comparison to left side due to pain    ASSESSMENT/PLAN:   Assessment & Plan Greater trochanteric pain syndrome of right lower extremity Patient with point tenderness over the greater trochanter as well as significant pain with resisted abduction of the right hip.  This is consistent with GTPS of right hip.  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  Patient agreeable to starting physical therapy and greater trochanteric bursa steroid injection today.   Physical therapy referral sent. Primary osteoarthritis of right hip Patient with severely decreased internal/external rotation of her right hip at which extremes causes her moderate pain.  Prior x-ray reviewed with moderate degenerative changes at that time.  Will repeat x-rays today.  Discussed with patient, will refer to sports medicine for future femoral acetabular steroid injection.  Also discussed referral to orthopedic surgery to discuss future right hip replacement as she has had success after previous hip replacement on the left. Both referrals placed.  PROCEDURE: Right Greater Trochanteric Bursa INJECTION: Patient was given informed consent, signed copy in the chart. Appropriate time out was taken. Area prepped and draped in usual sterile fashion. Ethyl chloride was  used for local anesthesia. A 22 gauge spinal needle was used. 1 cc of methylprednisolone 40 mg/ml plus 3 cc of 1% lidocaine without epinephrine was injected into the right greater trochanteric bursa using a(n) anterior approach.   The patient tolerated the procedure well. There were no complications. Post procedure instructions were given.   Return if symptoms worsen or fail to improve.  Celine Mans, MD [facility]

## 2022-12-13 NOTE — Assessment & Plan Note (Signed)
>>  ASSESSMENT AND PLAN FOR OSTEOARTHRITIS OF RIGHT HIP WRITTEN ON 12/13/2022  5:55 PM BY Celine Mans, MD  Patient with severely decreased internal/external rotation of her right hip at which extremes causes her moderate pain.  Prior x-ray reviewed with moderate degenerative changes at that time.  Will repeat x-rays today.  Discussed with patient, will refer to sports medicine for future femoral acetabular steroid injection.  Also discussed referral to orthopedic surgery to discuss future right hip replacement as she has had success after previous hip replacement on the left. Both referrals placed.

## 2022-12-13 NOTE — Patient Instructions (Addendum)
It was great to see you! Thank you for allowing me to participate in your care!  Our plans for today: -I have placed a referral to physical therapy, sports medicine, and orthopedic surgery for your hip.  They should reach out to call you in the next 2 weeks. -Please, get x-rays of your right hip after this appointment. -Today you received an injection with corticosteroid. This injection is usually done in response to pain and inflammation. There is some "numbing" medicine also in the shot so the injected area may be numb and feel really good for the next couple of hours. The numbing medicine usually wears off in 2-3 hours though, and then your pain level will be right back where it was before the injection.   The actually benefit from the steroid injection is usually noticed in 2-7 days. You may actually experience a small (as in 10%) INCREASE in pain in the first 24 hours---that is common.   Things to watch out for that you should contact us or a health care provider urgently would include: 1. Unusual (as in more than 10%) increase in pain 2. New fever > 101.5 3. New swelling or redness of the injected area.  4. Streaking of red lines around the area injected.   Please arrive 15 minutes PRIOR to your next scheduled appointment time! If you do not, this affects OTHER patients' care.  Take care and seek immediate care sooner if you develop any concerns.   Celine Mans, MD, PGY-2 [facility] 10:09 AM 12/13/2022  [facility]

## 2022-12-13 NOTE — Assessment & Plan Note (Addendum)
Patient with severely decreased internal/external rotation of her right hip at which extremes causes her moderate pain.  Prior x-ray reviewed with moderate degenerative changes at that time.  Will repeat x-rays today.  Discussed with patient, will refer to sports medicine for future femoral acetabular steroid injection.  Also discussed referral to orthopedic surgery to discuss future right hip replacement as she has had success after previous hip replacement on the left. Both referrals placed.

## 2023-01-01 ENCOUNTER — Encounter: Payer: Self-pay | Admitting: Family Medicine

## 2023-01-01 ENCOUNTER — Ambulatory Visit: Payer: Medicare HMO | Admitting: Family Medicine

## 2023-01-01 VITALS — BP 119/85 | HR 80 | Temp 98.1°F | Ht 68.0 in | Wt 221.8 lb

## 2023-01-01 DIAGNOSIS — M1611 Unilateral primary osteoarthritis, right hip: Secondary | ICD-10-CM | POA: Diagnosis not present

## 2023-01-01 DIAGNOSIS — Z23 Encounter for immunization: Secondary | ICD-10-CM | POA: Diagnosis not present

## 2023-01-01 MED ORDER — KETOROLAC TROMETHAMINE 60 MG/2ML IM SOLN
60.0000 mg | Freq: Once | INTRAMUSCULAR | Status: AC
Start: 2023-01-01 — End: 2023-01-01
  Administered 2023-01-01: 60 mg via INTRAMUSCULAR

## 2023-01-02 ENCOUNTER — Encounter: Payer: Self-pay | Admitting: Family Medicine

## 2023-01-02 NOTE — Progress Notes (Signed)
    CHIEF COMPLAINT / HPI: Right hip pain.  Has already seen her orthopedist and is planning for total hip replacement.  Needs medical clearance.  Would also like to see if she could get a Toradol shot today because pain is really bothering her.  She stepped to the side awkwardly the other day and that hip did not like that motion.  Has been especially aggravated since then.   PERTINENT  PMH / PSH: I have reviewed the patient's medications, allergies, past medical and surgical history, smoking status and updated in the EMR as appropriate. We reviewed x-ray images with her which show significant osteoarthritis right hip.  Status post left hip THR.  OBJECTIVE:  BP 119/85   Pulse 80   Temp 98.1 F (36.7 C)   Ht 5\' 8"  (1.727 m)   Wt 221 lb 12.8 oz (100.6 kg)   LMP 09/20/2011   SpO2 100%   BMI 33.72 kg/m  GENERAL: Well-developed, no acute distress GAIT: Antalgic CV: Regular rate and rhythm ASSESSMENT / PLAN: Surgical clearance.  Have given her a letter to this effect.  We went ahead and gave her updated immunizations for flu and COVID today.  No problem-specific Assessment & Plan notes found for this encounter.   Denny Levy MD

## 2023-01-14 ENCOUNTER — Other Ambulatory Visit: Payer: Self-pay | Admitting: Orthopaedic Surgery

## 2023-01-21 NOTE — Patient Instructions (Signed)
SURGICAL WAITING ROOM VISITATION  Patients having surgery or a procedure may have no more than 2 support people in the waiting area - these visitors may rotate.    Children under the age of 48 must have an adult with them who is not the patient.  Due to an increase in RSV and influenza rates and associated hospitalizations, children ages 31 and under may not visit patients in [facility] hospitals.  If the patient needs to stay at the hospital during part of their recovery, the visitor guidelines for inpatient rooms apply. Pre-op nurse will coordinate an appropriate time for 1 support person to accompany patient in pre-op.  This support person may not rotate.    Please refer to the [facility] website for the visitor guidelines for Inpatients (after your surgery is over and you are in a regular room).    Your procedure is scheduled on: 02/04/23   Report to [facility] Main [facility]    Report to admitting at 5:15 AM   Call this number if you have problems the morning of surgery [phone]   Do not eat food :After Midnight.   After Midnight you may have the following liquids until 4:30 AM DAY OF SURGERY  Water Non-Citrus Juices (without pulp, NO RED-Apple, White grape, White cranberry) Black Coffee (NO MILK/CREAM OR CREAMERS, sugar ok)  Clear Tea (NO MILK/CREAM OR CREAMERS, sugar ok) regular and decaf                             Plain Jell-O (NO RED)                                           Fruit ices (not with fruit pulp, NO RED)                                     Popsicles (NO RED)                                                               Sports drinks like Gatorade (NO RED)    The day of surgery:  Drink ONE (1) Pre-Surgery G2 at 4:30 AM the morning of surgery. Drink in one sitting. Do not sip.  This drink was given to you during your hospital  pre-op appointment visit. Nothing else to drink after completing the  Pre-Surgery G2.          If you have questions,  please contact your surgeon's office.   FOLLOW BOWEL PREP AND ANY ADDITIONAL PRE OP INSTRUCTIONS YOU RECEIVED FROM YOUR SURGEON'S OFFICE!!!     Oral Hygiene is also important to reduce your risk of infection.                                    Remember - BRUSH YOUR TEETH THE MORNING OF SURGERY WITH YOUR REGULAR TOOTHPASTE  DENTURES WILL BE REMOVED PRIOR TO SURGERY PLEASE DO NOT APPLY "Poly grip" OR ADHESIVES!!!  Stop all vitamins and herbal supplements 7 days before surgery.   Take these medicines the morning of surgery with A SIP OF WATER: Alprazolam, Percocet, Pantoprazole, Rosuvastatin              You may not have any metal on your body including hair pins, jewelry, and body piercing             Do not wear make-up, lotions, powders, perfumes, or deodorant  Do not wear nail polish including gel and S&S, artificial/acrylic nails, or any other type of covering on natural nails including finger and toenails. If you have artificial nails, gel coating, etc. that needs to be removed by a nail salon please have this removed prior to surgery or surgery may need to be canceled/ delayed if the surgeon/ anesthesia feels like they are unable to be safely monitored.   Do not shave  48 hours prior to surgery.    Do not bring valuables to the hospital. [facility] IS NOT             RESPONSIBLE   FOR VALUABLES.   Contacts, glasses, dentures or bridgework may not be worn into surgery.  DO NOT BRING YOUR HOME MEDICATIONS TO THE HOSPITAL. PHARMACY WILL DISPENSE MEDICATIONS LISTED ON YOUR MEDICATION LIST TO YOU DURING YOUR ADMISSION IN THE HOSPITAL!    Patients discharged on the day of surgery will not be allowed to drive home.  Someone NEEDS to stay with you for the first 24 hours after anesthesia.   Special Instructions: Bring a copy of your healthcare power of attorney and living will documents the day of surgery if you haven't scanned them before.              Please read over the following  fact sheets you were given: IF YOU HAVE QUESTIONS ABOUT YOUR PRE-OP INSTRUCTIONS PLEASE CALL [name]    If you received a COVID test during your pre-op visit  it is requested that you wear a mask when out in public, stay away from anyone that may not be feeling well and notify your surgeon if you develop symptoms. If you test positive for Covid or have been in contact with anyone that has tested positive in the last 10 days please notify you surgeon.      Pre-operative 5 CHG Bath Instructions   You can play a key role in reducing the risk of infection after surgery. Your skin needs to be as free of germs as possible. You can reduce the number of germs on your skin by washing with CHG (chlorhexidine gluconate) soap before surgery. CHG is an antiseptic soap that kills germs and continues to kill germs even after washing.   DO NOT use if you have an allergy to chlorhexidine/CHG or antibacterial soaps. If your skin becomes reddened or irritated, stop using the CHG and notify one of our RNs at [phone].   Please shower with the CHG soap starting 4 days before surgery using the following schedule:     Please keep in mind the following:  DO NOT shave, including legs and underarms, starting the day of your first shower.   You may shave your face at any point before/day of surgery.  Place clean sheets on your bed the day you start using CHG soap. Use a clean washcloth (not used since being washed) for each shower. DO NOT sleep with pets once you start using the CHG.   CHG Shower Instructions:  If you  choose to wash your hair and private area, wash first with your normal shampoo/soap.  After you use shampoo/soap, rinse your hair and body thoroughly to remove shampoo/soap residue.  Turn the water OFF and apply about 3 tablespoons (45 ml) of CHG soap to a CLEAN washcloth.  Apply CHG soap ONLY FROM YOUR NECK DOWN TO YOUR TOES (washing for 3-5 minutes)  DO NOT use CHG soap on face,  private areas, open wounds, or sores.  Pay special attention to the area where your surgery is being performed.  If you are having back surgery, having someone wash your back for you may be helpful. Wait 2 minutes after CHG soap is applied, then you may rinse off the CHG soap.  Pat dry with a clean towel  Put on clean clothes/pajamas   If you choose to wear lotion, please use ONLY the CHG-compatible lotions on the back of this paper.     Additional instructions for the day of surgery: DO NOT APPLY any lotions, deodorants, cologne, or perfumes.   Put on clean/comfortable clothes.  Brush your teeth.  Ask your nurse before applying any prescription medications to the skin.      CHG Compatible Lotions   Aveeno Moisturizing lotion  Cetaphil Moisturizing Cream  Cetaphil Moisturizing Lotion  Clairol Herbal Essence Moisturizing Lotion, Dry Skin  Clairol Herbal Essence Moisturizing Lotion, Extra Dry Skin  Clairol Herbal Essence Moisturizing Lotion, Normal Skin  Curel Age Defying Therapeutic Moisturizing Lotion with Alpha Hydroxy  Curel Extreme Care Body Lotion  Curel Soothing Hands Moisturizing Hand Lotion  Curel Therapeutic Moisturizing Cream, Fragrance-Free  Curel Therapeutic Moisturizing Lotion, Fragrance-Free  Curel Therapeutic Moisturizing Lotion, Original Formula  Eucerin Daily Replenishing Lotion  Eucerin Dry Skin Therapy Plus Alpha Hydroxy Crme  Eucerin Dry Skin Therapy Plus Alpha Hydroxy Lotion  Eucerin Original Crme  Eucerin Original Lotion  Eucerin Plus Crme Eucerin Plus Lotion  Eucerin TriLipid Replenishing Lotion  Keri Anti-Bacterial Hand Lotion  Keri Deep Conditioning Original Lotion Dry Skin Formula Softly Scented  Keri Deep Conditioning Original Lotion, Fragrance Free Sensitive Skin Formula  Keri Lotion Fast Absorbing Fragrance Free Sensitive Skin Formula  Keri Lotion Fast Absorbing Softly Scented Dry Skin Formula  Keri Original Lotion  Keri Skin Renewal Lotion  Keri Silky Smooth Lotion  Keri Silky Smooth Sensitive Skin Lotion  Nivea Body Creamy Conditioning Oil  Nivea Body Extra Enriched Teacher, adult education Moisturizing Lotion Nivea Crme  Nivea Skin Firming Lotion  NutraDerm 30 Skin Lotion  NutraDerm Skin Lotion  NutraDerm Therapeutic Skin Cream  NutraDerm Therapeutic Skin Lotion  ProShield Protective Hand Cream  Provon moisturizing lotion  WHAT IS A BLOOD TRANSFUSION? Blood Transfusion Information  A transfusion is the replacement of blood or some of its parts. Blood is made up of multiple cells which provide different functions. Red blood cells carry oxygen and are used for blood loss replacement. White blood cells fight against infection. Platelets control bleeding. Plasma helps clot blood. Other blood products are available for specialized needs, such as hemophilia or other clotting disorders. BEFORE THE TRANSFUSION  Who gives blood for transfusions?  Healthy volunteers who are fully evaluated to make sure their blood is safe. This is blood bank blood. Transfusion therapy is the safest it has ever been in the practice of medicine. Before blood is taken from a donor, a complete history is taken to make sure that person has no history of diseases nor engages in risky social behavior (examples  are intravenous drug use or sexual activity with multiple partners). The donor's travel history is screened to minimize risk of transmitting infections, such as malaria. The donated blood is tested for signs of infectious diseases, such as HIV and hepatitis. The blood is then tested to be sure it is compatible with you in order to minimize the chance of a transfusion reaction. If you or a relative donates blood, this is often done in anticipation of surgery and is not appropriate for emergency situations. It takes many days to process the donated blood. RISKS AND COMPLICATIONS Although transfusion therapy is very safe and  saves many lives, the main dangers of transfusion include:  Getting an infectious disease. Developing a transfusion reaction. This is an allergic reaction to something in the blood you were given. Every precaution is taken to prevent this. The decision to have a blood transfusion has been considered carefully by your caregiver before blood is given. Blood is not given unless the benefits outweigh the risks. AFTER THE TRANSFUSION Right after receiving a blood transfusion, you will usually feel much better and more energetic. This is especially true if your red blood cells have gotten low (anemic). The transfusion raises the level of the red blood cells which carry oxygen, and this usually causes an energy increase. The nurse administering the transfusion will monitor you carefully for complications. HOME CARE INSTRUCTIONS  No special instructions are needed after a transfusion. You may find your energy is better. Speak with your caregiver about any limitations on activity for underlying diseases you may have. SEEK MEDICAL CARE IF:  Your condition is not improving after your transfusion. You develop redness or irritation at the intravenous (IV) site. SEEK IMMEDIATE MEDICAL CARE IF:  Any of the following symptoms occur over the next 12 hours: Shaking chills. You have a temperature by mouth above 102 F (38.9 C), not controlled by medicine. Chest, back, or muscle pain. People around you feel you are not acting correctly or are confused. Shortness of breath or difficulty breathing. Dizziness and fainting. You get a rash or develop hives. You have a decrease in urine output. Your urine turns a dark color or changes to pink, red, or brown. Any of the following symptoms occur over the next 10 days: You have a temperature by mouth above 102 F (38.9 C), not controlled by medicine. Shortness of breath. Weakness after normal activity. The white part of the eye turns yellow (jaundice). You have a  decrease in the amount of urine or are urinating less often. Your urine turns a dark color or changes to pink, red, or brown. Document Released: 03/08/2000 Document Revised: 06/03/2011 Document Reviewed: 10/26/2007 [facility] Patient Information 2014 [place], [place].  _______________________________________________________________________

## 2023-01-21 NOTE — Progress Notes (Signed)
COVID Vaccine Completed: yes  Date of COVID positive in last 90 days:  PCP - Denny Levy ,MD Cardiologist - Dietrich Pates, MD LOV 2013  Clearance by Denny Levy, MD 01/01/23 in Epic   Chest x-ray - yes with PCP EKG - with PCP per pt Stress Test - yes medicine induced ECHO - 10/31/22 Epic Cardiac Cath - yes, 2009 Pacemaker/ICD device last checked: n/a Spinal Cord Stimulator: n/a  Bowel Prep - no  Sleep Study - no CPAP -   Fasting Blood Sugar - pre DM, no checks or meds Checks Blood Sugar _____ times a day  Last dose of GLP1 agonist-  N/A GLP1 instructions:  N/A   Last dose of SGLT-2 inhibitors-  N/A SGLT-2 instructions: N/A   Blood Thinner Instructions:  Time Aspirin Instructions: ASA 81, continue per pt Last Dose:  Activity level: Can go up a flight of stairs and perform activities of daily living without stopping and without symptoms of chest pain or shortness of breath. Chest tightness once a month can be with or without activity   Anesthesia review: non obstructive CAD, HTN, pre DM, OSA  Patient denies shortness of breath, fever, cough and chest pain at PAT appointment  Patient verbalized understanding of instructions that were given to them at the PAT appointment. Patient was also instructed that they will need to review over the PAT instructions again at home before surgery.

## 2023-01-21 NOTE — Progress Notes (Signed)
Please place orders for PAT appointment scheduled 01/22/23.

## 2023-01-22 ENCOUNTER — Encounter (HOSPITAL_COMMUNITY): Payer: Self-pay

## 2023-01-22 ENCOUNTER — Encounter (HOSPITAL_COMMUNITY)
Admission: RE | Admit: 2023-01-22 | Discharge: 2023-01-22 | Discharge status: Home / Self Care | Payer: Medicare HMO | Source: Ambulatory Visit | Attending: Orthopaedic Surgery

## 2023-01-22 ENCOUNTER — Other Ambulatory Visit: Payer: Self-pay

## 2023-01-22 VITALS — BP 134/104 | HR 73 | Temp 99.0°F | Resp 16 | Ht 68.0 in | Wt 219.0 lb

## 2023-01-22 DIAGNOSIS — Z01812 Encounter for preprocedural laboratory examination: Secondary | ICD-10-CM | POA: Diagnosis not present

## 2023-01-22 DIAGNOSIS — Z01818 Encounter for other preprocedural examination: Secondary | ICD-10-CM | POA: Diagnosis present

## 2023-01-22 DIAGNOSIS — I10 Essential (primary) hypertension: Secondary | ICD-10-CM | POA: Insufficient documentation

## 2023-01-22 DIAGNOSIS — R7303 Prediabetes: Secondary | ICD-10-CM

## 2023-01-22 HISTORY — DX: Heart failure, unspecified: I50.9

## 2023-01-22 LAB — SURGICAL PCR SCREEN
MRSA, PCR: NEGATIVE
Staphylococcus aureus: NEGATIVE

## 2023-01-22 LAB — TYPE AND SCREEN
ABO/RH(D): B POS
Antibody Screen: NEGATIVE

## 2023-01-22 LAB — BASIC METABOLIC PANEL
Anion gap: 9 (ref 5–15)
BUN: 20 mg/dL (ref 8–23)
CO2: 25 mmol/L (ref 22–32)
Calcium: 8.6 mg/dL — ABNORMAL LOW (ref 8.9–10.3)
Chloride: 106 mmol/L (ref 98–111)
Creatinine, Ser: 1 mg/dL (ref 0.44–1.00)
GFR, Estimated: >60 mL/min (ref >60)
Glucose, Bld: 133 mg/dL — ABNORMAL HIGH (ref 70–99)
Potassium: 3.6 mmol/L (ref 3.5–5.1)
Sodium: 140 mmol/L (ref 135–145)

## 2023-01-22 LAB — CBC
HCT: 40.2 % (ref 36.0–46.0)
Hemoglobin: 12.6 g/dL (ref 12.0–15.0)
MCH: 28.6 pg (ref 26.0–34.0)
MCHC: 31.3 g/dL (ref 30.0–36.0)
MCV: 91.2 fL (ref 80.0–100.0)
Platelets: 209 10*3/uL (ref 150–400)
RBC: 4.41 MIL/uL (ref 3.87–5.11)
RDW: 16 % — ABNORMAL HIGH (ref 11.5–15.5)
WBC: 7.9 10*3/uL (ref 4.0–10.5)
nRBC: 0 % (ref 0.0–0.2)

## 2023-02-03 MED ORDER — SODIUM CHLORIDE 0.9 % IV SOLN
2000.0000 mg | INTRAVENOUS | Status: DC
  Filled 2023-02-03: qty 20
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
## 2023-02-04 ENCOUNTER — Ambulatory Visit (HOSPITAL_COMMUNITY): Payer: Medicare HMO | Admitting: Physician Assistant

## 2023-02-04 ENCOUNTER — Encounter (HOSPITAL_COMMUNITY): Payer: Self-pay | Admitting: Orthopaedic Surgery

## 2023-02-04 ENCOUNTER — Inpatient Hospital Stay (HOSPITAL_COMMUNITY)
Admission: AD | Admit: 2023-02-04 | Discharge: 2023-02-07 | DRG: 982 | Disposition: A | Discharge status: Home / Self Care | Payer: Medicare HMO | Attending: Orthopaedic Surgery | Admitting: Orthopaedic Surgery

## 2023-02-04 ENCOUNTER — Encounter (HOSPITAL_COMMUNITY)
Admission: AD | Disposition: A | Discharge status: Home / Self Care | Payer: Self-pay | Source: Home / Self Care | Attending: Orthopaedic Surgery

## 2023-02-04 ENCOUNTER — Other Ambulatory Visit: Payer: Self-pay

## 2023-02-04 ENCOUNTER — Ambulatory Visit (HOSPITAL_COMMUNITY): Payer: Medicare HMO | Admitting: Anesthesiology

## 2023-02-04 ENCOUNTER — Ambulatory Visit (HOSPITAL_COMMUNITY): Payer: Medicare HMO

## 2023-02-04 DIAGNOSIS — N809 Endometriosis, unspecified: Secondary | ICD-10-CM | POA: Diagnosis present

## 2023-02-04 DIAGNOSIS — D62 Acute posthemorrhagic anemia: Secondary | ICD-10-CM | POA: Diagnosis not present

## 2023-02-04 DIAGNOSIS — G4730 Sleep apnea, unspecified: Secondary | ICD-10-CM | POA: Diagnosis present

## 2023-02-04 DIAGNOSIS — Z5986 Financial insecurity: Secondary | ICD-10-CM

## 2023-02-04 DIAGNOSIS — Z86011 Personal history of benign neoplasm of the brain: Secondary | ICD-10-CM

## 2023-02-04 DIAGNOSIS — I2510 Atherosclerotic heart disease of native coronary artery without angina pectoris: Secondary | ICD-10-CM | POA: Diagnosis present

## 2023-02-04 DIAGNOSIS — Z79899 Other long term (current) drug therapy: Secondary | ICD-10-CM

## 2023-02-04 DIAGNOSIS — M1611 Unilateral primary osteoarthritis, right hip: Principal | ICD-10-CM | POA: Diagnosis present

## 2023-02-04 DIAGNOSIS — F419 Anxiety disorder, unspecified: Secondary | ICD-10-CM | POA: Diagnosis present

## 2023-02-04 DIAGNOSIS — Z96643 Presence of artificial hip joint, bilateral: Secondary | ICD-10-CM | POA: Diagnosis present

## 2023-02-04 DIAGNOSIS — Y838 Other surgical procedures as the cause of abnormal reaction of the patient, or of later complication, without mention of misadventure at the time of the procedure: Secondary | ICD-10-CM | POA: Diagnosis not present

## 2023-02-04 DIAGNOSIS — Z8701 Personal history of pneumonia (recurrent): Secondary | ICD-10-CM

## 2023-02-04 DIAGNOSIS — K219 Gastro-esophageal reflux disease without esophagitis: Secondary | ICD-10-CM | POA: Diagnosis present

## 2023-02-04 DIAGNOSIS — Z87891 Personal history of nicotine dependence: Secondary | ICD-10-CM

## 2023-02-04 DIAGNOSIS — M545 Low back pain, unspecified: Secondary | ICD-10-CM | POA: Diagnosis present

## 2023-02-04 DIAGNOSIS — R7303 Prediabetes: Secondary | ICD-10-CM | POA: Diagnosis present

## 2023-02-04 DIAGNOSIS — Z96641 Presence of right artificial hip joint: Secondary | ICD-10-CM

## 2023-02-04 DIAGNOSIS — Z9049 Acquired absence of other specified parts of digestive tract: Secondary | ICD-10-CM

## 2023-02-04 DIAGNOSIS — I872 Venous insufficiency (chronic) (peripheral): Secondary | ICD-10-CM | POA: Diagnosis present

## 2023-02-04 DIAGNOSIS — K760 Fatty (change of) liver, not elsewhere classified: Secondary | ICD-10-CM | POA: Diagnosis present

## 2023-02-04 DIAGNOSIS — E785 Hyperlipidemia, unspecified: Principal | ICD-10-CM | POA: Diagnosis present

## 2023-02-04 DIAGNOSIS — Z6832 Body mass index (BMI) 32.0-32.9, adult: Secondary | ICD-10-CM

## 2023-02-04 DIAGNOSIS — F32A Depression, unspecified: Secondary | ICD-10-CM | POA: Diagnosis present

## 2023-02-04 DIAGNOSIS — Z885 Allergy status to narcotic agent status: Secondary | ICD-10-CM

## 2023-02-04 DIAGNOSIS — R072 Precordial pain: Secondary | ICD-10-CM | POA: Diagnosis not present

## 2023-02-04 DIAGNOSIS — M1711 Unilateral primary osteoarthritis, right knee: Secondary | ICD-10-CM | POA: Diagnosis present

## 2023-02-04 DIAGNOSIS — Z860101 Personal history of adenomatous and serrated colon polyps: Secondary | ICD-10-CM

## 2023-02-04 DIAGNOSIS — R071 Chest pain on breathing: Principal | ICD-10-CM | POA: Diagnosis present

## 2023-02-04 DIAGNOSIS — Z96642 Presence of left artificial hip joint: Secondary | ICD-10-CM | POA: Diagnosis present

## 2023-02-04 DIAGNOSIS — G8929 Other chronic pain: Secondary | ICD-10-CM | POA: Diagnosis present

## 2023-02-04 DIAGNOSIS — Z8249 Family history of ischemic heart disease and other diseases of the circulatory system: Secondary | ICD-10-CM

## 2023-02-04 DIAGNOSIS — E669 Obesity, unspecified: Secondary | ICD-10-CM | POA: Diagnosis present

## 2023-02-04 DIAGNOSIS — Z888 Allergy status to other drugs, medicaments and biological substances status: Secondary | ICD-10-CM

## 2023-02-04 DIAGNOSIS — I25119 Atherosclerotic heart disease of native coronary artery with unspecified angina pectoris: Secondary | ICD-10-CM

## 2023-02-04 DIAGNOSIS — Z7982 Long term (current) use of aspirin: Secondary | ICD-10-CM

## 2023-02-04 DIAGNOSIS — Z833 Family history of diabetes mellitus: Secondary | ICD-10-CM

## 2023-02-04 DIAGNOSIS — H9192 Unspecified hearing loss, left ear: Secondary | ICD-10-CM | POA: Diagnosis present

## 2023-02-04 DIAGNOSIS — Z825 Family history of asthma and other chronic lower respiratory diseases: Secondary | ICD-10-CM

## 2023-02-04 DIAGNOSIS — I10 Essential (primary) hypertension: Secondary | ICD-10-CM | POA: Diagnosis present

## 2023-02-04 DIAGNOSIS — Z9181 History of falling: Secondary | ICD-10-CM

## 2023-02-04 DIAGNOSIS — F1290 Cannabis use, unspecified, uncomplicated: Secondary | ICD-10-CM | POA: Diagnosis present

## 2023-02-04 HISTORY — PX: TOTAL HIP ARTHROPLASTY: SHX124

## 2023-02-04 LAB — TROPONIN I (HIGH SENSITIVITY)
Troponin I (High Sensitivity): 6 ng/L (ref <18)
Troponin I (High Sensitivity): 6 ng/L (ref <18)

## 2023-02-04 SURGERY — ARTHROPLASTY, HIP, TOTAL, ANTERIOR APPROACH
Anesthesia: Spinal | Site: Hip | Laterality: Right

## 2023-02-04 MED ORDER — METHOCARBAMOL 1000 MG/10ML IJ SOLN: 500.0000 mg | Freq: Four times a day (QID) | INTRAMUSCULAR | Status: DC | PRN

## 2023-02-04 MED ORDER — FENTANYL CITRATE (PF) 100 MCG/2ML IJ SOLN
INTRAMUSCULAR | Status: AC
  Filled 2023-02-04: qty 2

## 2023-02-04 MED ORDER — TRANEXAMIC ACID-NACL 1000-0.7 MG/100ML-% IV SOLN
1000.0000 mg | Freq: Once | INTRAVENOUS | Status: AC
  Administered 2023-02-04: 1000 mg via INTRAVENOUS
  Filled 2023-02-04: qty 100

## 2023-02-04 MED ORDER — MIDAZOLAM HCL 2 MG/2ML IJ SOLN
INTRAMUSCULAR | Status: DC | PRN
  Administered 2023-02-04: 2 mg via INTRAVENOUS

## 2023-02-04 MED ORDER — CEFAZOLIN SODIUM-DEXTROSE 2-4 GM/100ML-% IV SOLN
2.0000 g | Freq: Four times a day (QID) | INTRAVENOUS | Status: AC
Start: 2023-02-04 — End: 2023-02-04
  Administered 2023-02-04 (×2): 2 g via INTRAVENOUS
  Filled 2023-02-04 (×2): qty 100

## 2023-02-04 MED ORDER — ASPIRIN 81 MG PO CHEW
81.0000 mg | CHEWABLE_TABLET | Freq: Two times a day (BID) | ORAL | Status: DC
  Administered 2023-02-05 – 2023-02-07 (×5): 81 mg via ORAL
  Filled 2023-02-04 (×5): qty 1

## 2023-02-04 MED ORDER — FENTANYL CITRATE (PF) 100 MCG/2ML IJ SOLN
INTRAMUSCULAR | Status: DC | PRN
  Administered 2023-02-04 (×2): 50 ug via INTRATHECAL

## 2023-02-04 MED ORDER — LIDOCAINE HCL (PF) 2 % IJ SOLN
INTRAMUSCULAR | Status: AC
  Filled 2023-02-04: qty 5

## 2023-02-04 MED ORDER — POTASSIUM CHLORIDE CRYS ER 20 MEQ PO TBCR: 20.0000 meq | EXTENDED_RELEASE_TABLET | Freq: Every day | ORAL | Status: DC

## 2023-02-04 MED ORDER — HYDROMORPHONE HCL 1 MG/ML IJ SOLN
INTRAMUSCULAR | Status: AC
  Administered 2023-02-04: 0.5 mg via INTRAVENOUS
  Filled 2023-02-04: qty 1

## 2023-02-04 MED ORDER — METOCLOPRAMIDE HCL 5 MG PO TABS: 5.0000 mg | ORAL_TABLET | Freq: Three times a day (TID) | ORAL | Status: DC | PRN

## 2023-02-04 MED ORDER — ONDANSETRON HCL 4 MG/2ML IJ SOLN: 4.0000 mg | Freq: Four times a day (QID) | INTRAMUSCULAR | Status: DC | PRN

## 2023-02-04 MED ORDER — CEFAZOLIN SODIUM-DEXTROSE 2-3 GM-%(50ML) IV SOLR
INTRAVENOUS | Status: DC | PRN
  Administered 2023-02-04: 2 g via INTRAVENOUS

## 2023-02-04 MED ORDER — OXYCODONE HCL 5 MG PO TABS
10.0000 mg | ORAL_TABLET | ORAL | Status: DC | PRN
  Administered 2023-02-04 – 2023-02-06 (×7): 15 mg via ORAL
  Administered 2023-02-07 (×2): 10 mg via ORAL
  Filled 2023-02-04 (×2): qty 3
  Filled 2023-02-04: qty 2
  Filled 2023-02-04 (×5): qty 3

## 2023-02-04 MED ORDER — METOCLOPRAMIDE HCL 5 MG/ML IJ SOLN: 5.0000 mg | Freq: Three times a day (TID) | INTRAMUSCULAR | Status: DC | PRN

## 2023-02-04 MED ORDER — LISINOPRIL 20 MG PO TABS
40.0000 mg | ORAL_TABLET | Freq: Every day | ORAL | Status: DC
  Administered 2023-02-06 – 2023-02-07 (×2): 40 mg via ORAL
  Filled 2023-02-04 (×2): qty 2

## 2023-02-04 MED ORDER — DEXAMETHASONE SODIUM PHOSPHATE 10 MG/ML IJ SOLN
INTRAMUSCULAR | Status: AC
Start: 2023-02-04 — End: ?
  Filled 2023-02-04: qty 1

## 2023-02-04 MED ORDER — LACTATED RINGERS IV SOLN: INTRAVENOUS | Status: DC

## 2023-02-04 MED ORDER — ALPRAZOLAM 0.5 MG PO TABS
1.0000 mg | ORAL_TABLET | Freq: Three times a day (TID) | ORAL | Status: DC
  Administered 2023-02-04 – 2023-02-07 (×9): 1 mg via ORAL
  Filled 2023-02-04 (×9): qty 2

## 2023-02-04 MED ORDER — ORAL CARE MOUTH RINSE: 15.0000 mL | Freq: Once | OROMUCOSAL | Status: AC

## 2023-02-04 MED ORDER — ALUM & MAG HYDROXIDE-SIMETH 200-200-20 MG/5ML PO SUSP: 30.0000 mL | ORAL | Status: DC | PRN

## 2023-02-04 MED ORDER — PHENYLEPHRINE HCL (PRESSORS) 10 MG/ML IV SOLN
INTRAVENOUS | Status: DC | PRN
  Administered 2023-02-04: 240 ug via INTRAVENOUS
  Administered 2023-02-04: 160 ug via INTRAVENOUS
  Administered 2023-02-04: 120 ug via INTRAVENOUS
  Administered 2023-02-04: 80 ug via INTRAVENOUS
  Administered 2023-02-04: 160 ug via INTRAVENOUS

## 2023-02-04 MED ORDER — OXYCODONE HCL 5 MG PO TABS: 5.0000 mg | ORAL_TABLET | Freq: Once | ORAL | Status: DC | PRN

## 2023-02-04 MED ORDER — PROPOFOL 10 MG/ML IV BOLUS
INTRAVENOUS | Status: AC
  Filled 2023-02-04: qty 20

## 2023-02-04 MED ORDER — BUPIVACAINE-EPINEPHRINE (PF) 0.25% -1:200000 IJ SOLN
INTRAMUSCULAR | Status: DC | PRN
  Administered 2023-02-04: 30 mL via PERINEURAL

## 2023-02-04 MED ORDER — ACETAMINOPHEN 325 MG PO TABS
325.0000 mg | ORAL_TABLET | Freq: Four times a day (QID) | ORAL | Status: DC | PRN
  Filled 2023-02-04: qty 2

## 2023-02-04 MED ORDER — PANTOPRAZOLE SODIUM 40 MG PO TBEC
40.0000 mg | DELAYED_RELEASE_TABLET | Freq: Every day | ORAL | Status: DC
  Administered 2023-02-05 – 2023-02-07 (×3): 40 mg via ORAL
  Filled 2023-02-04 (×3): qty 1

## 2023-02-04 MED ORDER — CEFAZOLIN SODIUM-DEXTROSE 2-4 GM/100ML-% IV SOLN
2.0000 g | INTRAVENOUS | Status: DC
  Filled 2023-02-04: qty 100

## 2023-02-04 MED ORDER — TRAZODONE HCL 100 MG PO TABS
100.0000 mg | ORAL_TABLET | Freq: Every day | ORAL | Status: DC
  Administered 2023-02-04 – 2023-02-06 (×3): 100 mg via ORAL
  Filled 2023-02-04 (×3): qty 1

## 2023-02-04 MED ORDER — OXYCODONE HCL 5 MG/5ML PO SOLN: 5.0000 mg | Freq: Once | ORAL | Status: DC | PRN

## 2023-02-04 MED ORDER — ONDANSETRON HCL 4 MG PO TABS: 4.0000 mg | ORAL_TABLET | Freq: Four times a day (QID) | ORAL | Status: DC | PRN

## 2023-02-04 MED ORDER — PHENOL 1.4 % MT LIQD: 1.0000 | OROMUCOSAL | Status: DC | PRN

## 2023-02-04 MED ORDER — KETOROLAC TROMETHAMINE 15 MG/ML IJ SOLN
15.0000 mg | Freq: Four times a day (QID) | INTRAMUSCULAR | Status: AC
  Administered 2023-02-04 – 2023-02-05 (×4): 15 mg via INTRAVENOUS
  Filled 2023-02-04 (×4): qty 1

## 2023-02-04 MED ORDER — METHOCARBAMOL 500 MG PO TABS
500.0000 mg | ORAL_TABLET | Freq: Four times a day (QID) | ORAL | Status: DC | PRN
  Administered 2023-02-04 – 2023-02-06 (×6): 500 mg via ORAL
  Filled 2023-02-04 (×6): qty 1

## 2023-02-04 MED ORDER — TRANEXAMIC ACID 1000 MG/10ML IV SOLN
INTRAVENOUS | Status: DC | PRN
  Administered 2023-02-04: 2000 mg via TOPICAL

## 2023-02-04 MED ORDER — PHENYLEPHRINE 80 MCG/ML (10ML) SYRINGE FOR IV PUSH (FOR BLOOD PRESSURE SUPPORT)
PREFILLED_SYRINGE | INTRAVENOUS | Status: AC
  Filled 2023-02-04: qty 10

## 2023-02-04 MED ORDER — BUPIVACAINE-EPINEPHRINE 0.25% -1:200000 IJ SOLN
INTRAMUSCULAR | Status: AC
  Filled 2023-02-04: qty 1

## 2023-02-04 MED ORDER — SODIUM CHLORIDE 0.9 % IV SOLN: 12.5000 mg | INTRAVENOUS | Status: DC | PRN

## 2023-02-04 MED ORDER — HYDROMORPHONE HCL 1 MG/ML IJ SOLN
0.5000 mg | INTRAMUSCULAR | Status: DC | PRN
  Administered 2023-02-04: 1 mg via INTRAVENOUS
  Filled 2023-02-04: qty 1

## 2023-02-04 MED ORDER — MENTHOL 3 MG MT LOZG: 1.0000 | LOZENGE | OROMUCOSAL | Status: DC | PRN

## 2023-02-04 MED ORDER — TRANEXAMIC ACID-NACL 1000-0.7 MG/100ML-% IV SOLN
1000.0000 mg | INTRAVENOUS | Status: AC
  Administered 2023-02-04: 1000 mg via INTRAVENOUS
  Filled 2023-02-04: qty 100

## 2023-02-04 MED ORDER — FUROSEMIDE 20 MG PO TABS
20.0000 mg | ORAL_TABLET | Freq: Every day | ORAL | Status: DC
  Administered 2023-02-06 – 2023-02-07 (×2): 20 mg via ORAL
  Filled 2023-02-04 (×2): qty 1

## 2023-02-04 MED ORDER — BISACODYL 5 MG PO TBEC: 5.0000 mg | DELAYED_RELEASE_TABLET | Freq: Every day | ORAL | Status: DC | PRN

## 2023-02-04 MED ORDER — BUPIVACAINE LIPOSOME 1.3 % IJ SUSP
INTRAMUSCULAR | Status: DC | PRN
  Administered 2023-02-04: 10 mL

## 2023-02-04 MED ORDER — OXYCODONE HCL 5 MG PO TABS
5.0000 mg | ORAL_TABLET | ORAL | Status: DC | PRN
  Administered 2023-02-04 – 2023-02-07 (×3): 10 mg via ORAL
  Filled 2023-02-04 (×4): qty 2

## 2023-02-04 MED ORDER — MIDAZOLAM HCL 2 MG/2ML IJ SOLN
INTRAMUSCULAR | Status: AC
Start: 2023-02-04 — End: ?
  Filled 2023-02-04: qty 2

## 2023-02-04 MED ORDER — DOCUSATE SODIUM 100 MG PO CAPS
100.0000 mg | ORAL_CAPSULE | Freq: Two times a day (BID) | ORAL | Status: DC
  Administered 2023-02-04 – 2023-02-07 (×6): 100 mg via ORAL
  Filled 2023-02-04 (×6): qty 1

## 2023-02-04 MED ORDER — BUPIVACAINE LIPOSOME 1.3 % IJ SUSP: 10.0000 mL | Freq: Once | INTRAMUSCULAR | Status: DC

## 2023-02-04 MED ORDER — DIPHENHYDRAMINE HCL 12.5 MG/5ML PO ELIX: 12.5000 mg | ORAL_SOLUTION | ORAL | Status: DC | PRN

## 2023-02-04 MED ORDER — ACETAMINOPHEN 500 MG PO TABS
1000.0000 mg | ORAL_TABLET | Freq: Four times a day (QID) | ORAL | Status: AC
  Administered 2023-02-04 – 2023-02-05 (×4): 1000 mg via ORAL
  Filled 2023-02-04 (×4): qty 2

## 2023-02-04 MED ORDER — 0.9 % SODIUM CHLORIDE (POUR BTL) OPTIME
TOPICAL | Status: DC | PRN
  Administered 2023-02-04: 1000 mL

## 2023-02-04 MED ORDER — AMLODIPINE BESYLATE 5 MG PO TABS
5.0000 mg | ORAL_TABLET | Freq: Every day | ORAL | Status: DC
  Administered 2023-02-04 – 2023-02-06 (×3): 5 mg via ORAL
  Filled 2023-02-04 (×3): qty 1

## 2023-02-04 MED ORDER — POTASSIUM 99 MG PO TABS: 99.0000 mg | ORAL_TABLET | Freq: Every day | ORAL | Status: DC

## 2023-02-04 MED ORDER — CHLORHEXIDINE GLUCONATE 0.12 % MT SOLN
15.0000 mL | Freq: Once | OROMUCOSAL | Status: AC
  Administered 2023-02-04: 15 mL via OROMUCOSAL

## 2023-02-04 MED ORDER — ROSUVASTATIN CALCIUM 20 MG PO TABS
40.0000 mg | ORAL_TABLET | Freq: Every day | ORAL | Status: DC
  Administered 2023-02-05 – 2023-02-07 (×3): 40 mg via ORAL
  Filled 2023-02-04 (×3): qty 2

## 2023-02-04 MED ORDER — BUPIVACAINE LIPOSOME 1.3 % IJ SUSP
INTRAMUSCULAR | Status: AC
Start: 2023-02-04 — End: ?
  Filled 2023-02-04: qty 10

## 2023-02-04 MED ORDER — LACTATED RINGERS IV SOLN: INTRAVENOUS | Status: AC

## 2023-02-04 MED ORDER — DEXAMETHASONE SODIUM PHOSPHATE 4 MG/ML IJ SOLN
INTRAMUSCULAR | Status: DC | PRN
  Administered 2023-02-04: 10 mg via INTRAVENOUS

## 2023-02-04 MED ORDER — LACTATED RINGERS IV SOLN: INTRAVENOUS | Status: DC | PRN

## 2023-02-04 MED ORDER — PROPOFOL 500 MG/50ML IV EMUL
INTRAVENOUS | Status: DC | PRN
  Administered 2023-02-04: 75 ug/kg/min via INTRAVENOUS

## 2023-02-04 MED ORDER — POVIDONE-IODINE 10 % EX SWAB
2.0000 | Freq: Once | CUTANEOUS | Status: AC
  Administered 2023-02-04: 2 via TOPICAL

## 2023-02-04 MED ORDER — HYDROMORPHONE HCL 1 MG/ML IJ SOLN
0.2500 mg | INTRAMUSCULAR | Status: DC | PRN
Start: 2023-02-04 — End: 2023-02-04
  Administered 2023-02-04: 0.5 mg via INTRAVENOUS

## 2023-02-04 MED ORDER — ASPIRIN 81 MG PO TBEC: 81.0000 mg | DELAYED_RELEASE_TABLET | Freq: Every day | ORAL | Status: DC

## 2023-02-04 MED ORDER — BUPIVACAINE IN DEXTROSE 0.75-8.25 % IT SOLN
INTRATHECAL | Status: DC | PRN
  Administered 2023-02-04: 2 mL via INTRATHECAL

## 2023-02-04 SURGICAL SUPPLY — 52 items
BAG COUNTER SPONGE SURGICOUNT (BAG) IMPLANT
BAG DECANTER FOR FLEXI CONT (MISCELLANEOUS) ×2 IMPLANT
BAG SPNG CNTER NS LX DISP (BAG)
BLADE SAW SGTL 18X1.27X75 (BLADE) ×2 IMPLANT
BOOTIES KNEE HIGH SLOAN (MISCELLANEOUS) ×2 IMPLANT
CELLS DAT CNTRL 66122 CELL SVR (MISCELLANEOUS) ×1 IMPLANT
COVER PERINEAL POST (MISCELLANEOUS) ×2 IMPLANT
COVER SURGICAL LIGHT HANDLE (MISCELLANEOUS) ×2 IMPLANT
CUP ACETABULAR GRIPTON 100 52 (Orthopedic Implant) IMPLANT
DRAPE FOOT SWITCH (DRAPES) ×2 IMPLANT
DRAPE IMP U-DRAPE 54X76 (DRAPES) ×2 IMPLANT
DRAPE STERI IOBAN 125X83 (DRAPES) ×2 IMPLANT
DRAPE U-SHAPE 47X51 STRL (DRAPES) ×2 IMPLANT
DRSG AQUACEL AG ADV 3.5X 6 (GAUZE/BANDAGES/DRESSINGS) ×2 IMPLANT
DURAPREP 26ML APPLICATOR (WOUND CARE) ×2 IMPLANT
ELECT BLADE TIP CTD 4 INCH (ELECTRODE) ×2 IMPLANT
ELECT REM PT RETURN 15FT ADLT (MISCELLANEOUS) ×2 IMPLANT
ELIMINATOR HOLE APEX DEPUY (Hips) IMPLANT
GLOVE BIO SURGEON STRL SZ8 (GLOVE) ×4 IMPLANT
GLOVE BIOGEL PI IND STRL 7.0 (GLOVE) ×2 IMPLANT
GLOVE BIOGEL PI IND STRL 8 (GLOVE) ×4 IMPLANT
GLOVE SURG SYN 7.0 (GLOVE) ×1 IMPLANT
GLOVE SURG SYN 7.0 PF PI (GLOVE) ×2 IMPLANT
GOWN SRG XL LVL 4 BRTHBL STRL (GOWNS) ×2 IMPLANT
GOWN STRL NON-REIN XL LVL4 (GOWNS) ×1
GOWN STRL REUS W/ TWL XL LVL3 (GOWN DISPOSABLE) ×4 IMPLANT
GOWN STRL REUS W/TWL XL LVL3 (GOWN DISPOSABLE) ×2
GRIPTON 100 52 (Orthopedic Implant) ×1 IMPLANT
HEAD CERAMIC 36 PLUS5 (Hips) IMPLANT
HOLDER FOLEY CATH W/STRAP (MISCELLANEOUS) ×2 IMPLANT
KIT TURNOVER KIT A (KITS) IMPLANT
LINER NEUTRAL 52X36MM PLUS 4 (Liner) IMPLANT
MANIFOLD NEPTUNE II (INSTRUMENTS) ×2 IMPLANT
NDL HYPO 22X1.5 SAFETY MO (MISCELLANEOUS) ×2 IMPLANT
NEEDLE HYPO 22X1.5 SAFETY MO (MISCELLANEOUS) ×1 IMPLANT
NS IRRIG 1000ML POUR BTL (IV SOLUTION) ×2 IMPLANT
PACK ANTERIOR HIP CUSTOM (KITS) ×2 IMPLANT
PROTECTOR NERVE ULNAR (MISCELLANEOUS) ×2 IMPLANT
RETRACTOR WND ALEXIS 18 MED (MISCELLANEOUS) ×2 IMPLANT
RTRCTR WOUND ALEXIS 18CM MED (MISCELLANEOUS) ×1
SPIKE FLUID TRANSFER (MISCELLANEOUS) ×2 IMPLANT
STEM FEMORAL SZ 5MM STD ACTIS (Stem) IMPLANT
SUT ETHIBOND NAB CT1 #1 30IN (SUTURE) ×4 IMPLANT
SUT STRATAFIX 0 PDS 27 VIOLET (SUTURE) ×1
SUT VIC AB 1 CT1 36 (SUTURE) ×2 IMPLANT
SUT VIC AB 2-0 CT1 27 (SUTURE) ×1
SUT VIC AB 2-0 CT1 TAPERPNT 27 (SUTURE) ×2 IMPLANT
SUT VICRYL AB 3-0 FS1 BRD 27IN (SUTURE) ×2 IMPLANT
SUTURE STRATFX 0 PDS 27 VIOLET (SUTURE) ×2 IMPLANT
SYR 50ML LL SCALE MARK (SYRINGE) ×2 IMPLANT
TRAY FOLEY MTR SLVR 16FR STAT (SET/KITS/TRAYS/PACK) ×2 IMPLANT
YANKAUER SUCT BULB TIP NO VENT (SUCTIONS) ×2 IMPLANT

## 2023-02-04 NOTE — Transfer of Care (Signed)
Immediate Anesthesia Transfer of Care Note  Patient: Shannon Stuart  Procedure(s) Performed: RIGHT TOTAL HIP ARTHROPLASTY ANTERIOR APPROACH (Right: Hip)  Patient Location: PACU  Anesthesia Type:MAC  Level of Consciousness: awake, alert , and oriented  Airway & Oxygen Therapy: Patient Spontanous Breathing and Patient connected to face mask oxygen  Post-op Assessment: Report given to RN and Post -op Vital signs reviewed and stable  Post vital signs: Reviewed and stable  Last Vitals:  Vitals Value Taken Time  BP 129/96 02/04/23 0930  Temp    Pulse 87 02/04/23 0930  Resp 18 02/04/23 0930  SpO2 90 % 02/04/23 0930  Vitals shown include unfiled device data.  Last Pain:  Vitals:   02/04/23 0624  TempSrc: Oral  PainSc:          Complications: No notable events documented.

## 2023-02-04 NOTE — Anesthesia Preprocedure Evaluation (Signed)
Anesthesia Evaluation  Patient identified by MRN, date of birth, ID band Patient awake    Reviewed: Allergy & Precautions, NPO status , Patient's Chart, lab work & pertinent test results  Airway Mallampati: II  TM Distance: >3 FB Neck ROM: Full    Dental no notable dental hx. (+) Teeth Intact, Dental Advisory Given   Pulmonary sleep apnea (no CPAP) , Patient abstained from smoking., former smoker   Pulmonary exam normal breath sounds clear to auscultation       Cardiovascular hypertension, Pt. on medications + CAD  Normal cardiovascular exam Rhythm:Regular Rate:Normal  TTE 2019 - Left ventricle: The cavity size was normal. Systolic function was  normal. The estimated ejection fraction was in the range of 55%  to 60%. Wall motion was normal; there were no regional wall  motion abnormalities. Doppler parameters are consistent with  abnormal left ventricular relaxation (grade 1 diastolic  dysfunction). There was no evidence of elevated ventricular  filling pressure by Doppler parameters.  - Aortic valve: There was no regurgitation.  - Aortic root: The aortic root was normal in size.  - Mitral valve: Structurally normal valve. There was no  regurgitation.  - Right ventricle: The cavity size was normal. Wall thickness was  normal. Systolic function was normal.  - Right atrium: The atrium was normal in size.  - Tricuspid valve: There was mild regurgitation.  - Pulmonic valve: There was no regurgitation.  - Pulmonary arteries: Systolic pressure was within the normal  range.  - Inferior vena cava: The vessel was normal in size.  - Pericardium, extracardiac: There was no pericardial effusion.    Neuro/Psych  Headaches PSYCHIATRIC DISORDERS Anxiety Depression       GI/Hepatic ,GERD  Medicated,,(+)     substance abuse  marijuana use  Endo/Other  negative endocrine ROS  Obese BMI 32  Renal/GU negative Renal  ROS  negative genitourinary   Musculoskeletal  (+) Arthritis , Osteoarthritis,    Abdominal  (+) + obese  Peds  Hematology negative hematology ROS (+)   Anesthesia Other Findings   Reproductive/Obstetrics                             Anesthesia Physical Anesthesia Plan  ASA: 3  Anesthesia Plan: Spinal   Post-op Pain Management:    Induction:   PONV Risk Score and Plan: 2 and Treatment may vary due to age or medical condition, Midazolam, Propofol infusion, Dexamethasone and Ondansetron  Airway Management Planned: Natural Airway  Additional Equipment:   Intra-op Plan:   Post-operative Plan:   Informed Consent: I have reviewed the patients History and Physical, chart, labs and discussed the procedure including the risks, benefits and alternatives for the proposed anesthesia with the patient or authorized representative who has indicated his/her understanding and acceptance.     Dental advisory given  Plan Discussed with: CRNA  Anesthesia Plan Comments:         Anesthesia Quick Evaluation

## 2023-02-04 NOTE — Progress Notes (Signed)
Contacted by Ortho regarding postop chest pain after knee surgery. Pain pressure like, but also worsened by deep breathing. Initial ECG was abnormal due to lead misplacement, second ECG is completely normal, NSR. BP is high, HR is normal. Suspicion for small fat embolism intra-op. She has no history of CAD, had normal coronary angio in 2009, normal nuclear perfusion scan in 2013, near-normal echo in August 2024. Advised checking hsTrop I - a small increase would not be surprising with pulmonary embolism, but if there is a sizeable increase, we will be glad to see her in consultation.

## 2023-02-04 NOTE — Plan of Care (Signed)
  Problem: Education: Goal: Knowledge of General Education information will improve Description: Including pain rating scale, medication(s)/side effects and non-pharmacologic comfort measures 02/04/2023 1302 by Marni Griffon I, RN Outcome: Progressing 02/04/2023 1301 by Marni Griffon I, RN Outcome: Progressing   Problem: Clinical Measurements: Goal: Ability to maintain clinical measurements within normal limits will improve Outcome: Progressing   Problem: Activity: Goal: Risk for activity intolerance will decrease 02/04/2023 1302 by Marni Griffon I, RN Outcome: Progressing 02/04/2023 1301 by Marni Griffon I, RN Outcome: Progressing   Problem: Nutrition: Goal: Adequate nutrition will be maintained 02/04/2023 1302 by Marni Griffon I, RN Outcome: Progressing 02/04/2023 1301 by Marni Griffon I, RN Outcome: Progressing   Problem: Elimination: Goal: Will not experience complications related to bowel motility 02/04/2023 1302 by Marni Griffon I, RN Outcome: Progressing 02/04/2023 1301 by Marni Griffon I, RN Outcome: Progressing   Problem: Pain Management: Goal: General experience of comfort will improve Outcome: Progressing   Problem: Safety: Goal: Ability to remain free from injury will improve Outcome: Progressing   Problem: Education: Goal: Knowledge of the prescribed therapeutic regimen will improve Outcome: Progressing   Problem: Activity: Goal: Ability to avoid complications of mobility impairment will improve Outcome: Progressing   Problem: Pain Management: Goal: Pain level will decrease with appropriate interventions Outcome: Progressing

## 2023-02-04 NOTE — Evaluation (Signed)
Physical Therapy Evaluation Patient Details Name: Shannon Stuart MRN: [id number] DOB: March 30, 1960 Today's Date: 02/04/2023  History of Present Illness  62 yo female presents to therapy s/p R THA, anterior approach on 02/04/2023 due to failure of conservative measures. Pt PMH includes but is not limited to: chronic pain, L HOH, L THA (2023), chronic pain, Kappa light chain dz, HLD, GAD, GERD, HTN, CAD, lumbar surgery and brain tumor excision.  Clinical Impression   Shannon Stuart is a 62 y.o. female POD 0 s/p R THA. Patient reports mod I  with mobility at baseline. Patient is now limited by functional impairments (see PT problem list below) and requires CGA for bed mobility and CGA and cues for transfers. Patient was able to ambulate 60 feet with RW and CGA level of assist. Patient instructed in exercise to facilitate ROM and circulation to manage edema. Patient will benefit from continued skilled PT interventions to address impairments and progress towards PLOF. Acute PT will follow to progress mobility and stair training in preparation for safe discharge home with family and social support with [facility] services.         If plan is discharge home, recommend the following: A little help with walking and/or transfers;A little help with bathing/dressing/bathroom;Assistance with cooking/housework;Assist for transportation;Help with stairs or ramp for entrance   Can travel by private vehicle        Equipment Recommendations None recommended by PT  Recommendations for Other Services       Functional Status Assessment Patient has had a recent decline in their functional status and demonstrates the ability to make significant improvements in function in a reasonable and predictable amount of time.     Precautions / Restrictions Precautions Precautions: Fall Restrictions Weight Bearing Restrictions: No RLE Weight Bearing: Weight bearing as tolerated      Mobility  Bed Mobility Overal bed  mobility: Needs Assistance Bed Mobility: Supine to Sit     Supine to sit: Contact guard, HOB elevated     General bed mobility comments: min cues    Transfers Overall transfer level: Needs assistance Equipment used: Rolling walker (2 wheels) Transfers: Sit to/from Stand Sit to Stand: Contact guard assist, From elevated surface           General transfer comment: min cues for proper UE placement pt demonstrated good recall for use of B UE for eccentric control to recliner    Ambulation/Gait Ambulation/Gait assistance: Contact guard assist Gait Distance (Feet): 60 Feet Assistive device: Rolling walker (2 wheels) Gait Pattern/deviations: Step-to pattern, Antalgic, Trunk flexed Gait velocity: decreased     General Gait Details: cues for safety and sequencing with gait  Stairs            Wheelchair Mobility     Tilt Bed    Modified Rankin (Stroke Patients Only)       Balance Overall balance assessment: Needs assistance Sitting-balance support: Feet supported Sitting balance-Leahy Scale: Good     Standing balance support: Bilateral upper extremity supported, During functional activity, Reliant on assistive device for balance Standing balance-Leahy Scale: Poor                               Pertinent Vitals/Pain Pain Assessment Pain Assessment: 0-10 Pain Score: 8  Pain Location: R hip and LE Pain Descriptors / Indicators: Aching, Constant, Discomfort, Dull, Grimacing, Operative site guarding, Penetrating Pain Intervention(s): Limited activity within patient's tolerance, Monitored during session, Premedicated  before session, Repositioned, Ice applied    Home Living Family/patient expects to be discharged to:: Private residence Living Arrangements: Other relatives Available Help at Discharge: Family Type of Home: Apartment Home Access: Stairs to enter Entrance Stairs-Rails: None Entrance Stairs-Number of Steps: 1   Home Layout: One  level Home Equipment: Agricultural consultant (2 wheels);Rollator (4 wheels);Cane - single point      Prior Function Prior Level of Function : Independent/Modified Independent;Driving             Mobility Comments: mod I with SPC for all ALDs, self care tasks and IADLs       Extremity/Trunk Assessment        Lower Extremity Assessment Lower Extremity Assessment: RLE deficits/detail RLE Deficits / Details: ankle DF 5/5, PF 4/5 RLE Sensation: decreased light touch    Cervical / Trunk Assessment Cervical / Trunk Assessment: Normal  Communication   Communication Communication: No apparent difficulties  Cognition Arousal: Alert Behavior During Therapy: WFL for tasks assessed/performed Overall Cognitive Status: Within Functional Limits for tasks assessed                                          General Comments      Exercises Total Joint Exercises Ankle Circles/Pumps: AROM, Both, 15 reps   Assessment/Plan    PT Assessment Patient needs continued PT services  PT Problem List Decreased strength;Decreased range of motion;Decreased activity tolerance;Decreased balance;Decreased mobility;Decreased coordination;Pain       PT Treatment Interventions DME instruction;Gait training;Stair training;Functional mobility training;Therapeutic activities;Therapeutic exercise;Balance training;Neuromuscular re-education;Patient/family education;Modalities    PT Goals (Current goals can be found in the Care Plan section)  Acute Rehab PT Goals Patient Stated Goal: loose weight and be able to return to a walking fitness program in addition to walking the dog PT Goal Formulation: With patient Time For Goal Achievement: 02/18/23 Potential to Achieve Goals: Good    Frequency 7X/week     Co-evaluation               AM-PAC PT "6 Clicks" Mobility  Outcome Measure Help needed turning from your back to your side while in a flat bed without using bedrails?: A Little Help  needed moving from lying on your back to sitting on the side of a flat bed without using bedrails?: A Little Help needed moving to and from a bed to a chair (including a wheelchair)?: A Little Help needed standing up from a chair using your arms (e.g., wheelchair or bedside chair)?: A Little Help needed to walk in hospital room?: A Little Help needed climbing 3-5 steps with a railing? : A Lot 6 Click Score: 17    End of Session Equipment Utilized During Treatment: Gait belt Activity Tolerance: Patient tolerated treatment well;No increased pain Patient left: in chair;with call bell/phone within reach Nurse Communication: Mobility status PT Visit Diagnosis: Unsteadiness on feet (R26.81);Other abnormalities of gait and mobility (R26.89);Muscle weakness (generalized) (M62.81);Difficulty in walking, not elsewhere classified (R26.2);Pain Pain - Right/Left: Right Pain - part of body: Hip;Leg    Time: 3664-4034 PT Time Calculation (min) (ACUTE ONLY): 26 min   Charges:   PT Evaluation $PT Eval Low Complexity: 1 Low PT Treatments $Gait Training: 8-22 mins PT General Charges $$ ACUTE PT VISIT: 1 Visit         Johnny Bridge, PT Acute Rehab   Jacqualyn Posey 02/04/2023, 5:45 PM

## 2023-02-04 NOTE — Interval H&P Note (Signed)
History and Physical Interval Note:  02/04/2023 7:19 AM  Shannon Stuart  has presented today for surgery, with the diagnosis of right hip degenerative joint disease.  The various methods of treatment have been discussed with the patient and family. After consideration of risks, benefits and other options for treatment, the patient has consented to  Procedure(s): RIGHT TOTAL HIP ARTHROPLASTY ANTERIOR APPROACH (Right) as a surgical intervention.  The patient's history has been reviewed, patient examined, no change in status, stable for surgery.  I have reviewed the patient's chart and labs.  Questions were answered to the patient's satisfaction.     Velna Ochs

## 2023-02-04 NOTE — Anesthesia Procedure Notes (Signed)
Spinal  Patient location during procedure: OR Start time: 02/04/2023 7:31 AM End time: 02/04/2023 7:36 AM Reason for block: surgical anesthesia Staffing Performed: anesthesiologist  Anesthesiologist: Lowella Curb, MD Performed by: Lowella Curb, MD Authorized by: Lowella Curb, MD   Preanesthetic Checklist Completed: patient identified, IV checked, site marked, risks and benefits discussed, surgical consent, monitors and equipment checked, pre-op evaluation and timeout performed Spinal Block Patient position: sitting Prep: DuraPrep Patient monitoring: heart rate, cardiac monitor, continuous pulse ox and blood pressure Approach: midline Location: L3-4 Injection technique: single-shot Needle Needle type: Sprotte  Needle gauge: 24 G Needle length: 9 cm Assessment Sensory level: T4 Events: CSF return

## 2023-02-04 NOTE — Anesthesia Postprocedure Evaluation (Signed)
Anesthesia Post Note  Patient: Shannon Stuart  Procedure(s) Performed: RIGHT TOTAL HIP ARTHROPLASTY ANTERIOR APPROACH (Right: Hip)     Patient location during evaluation: PACU Anesthesia Type: Spinal Level of consciousness: awake and alert Pain management: pain level controlled Vital Signs Assessment: post-procedure vital signs reviewed and stable Respiratory status: spontaneous breathing, nonlabored ventilation and respiratory function stable Cardiovascular status: blood pressure returned to baseline and stable Postop Assessment: no apparent nausea or vomiting Anesthetic complications: no   No notable events documented.  Last Vitals:  Vitals:   02/04/23 1115 02/04/23 1130  BP: (!) 128/96 (!) 128/94  Pulse: 85 87  Resp: 12 13  Temp:    SpO2: 94% 93%    Last Pain:  Vitals:   02/04/23 1130  TempSrc:   PainSc: Asleep      LLE Sensation: Decreased (02/04/23 1130)   RLE Sensation: Decreased (02/04/23 1130) L Sensory Level: L2-Upper inner thigh, upper buttock (02/04/23 1130) R Sensory Level: L2-Upper inner thigh, upper buttock (02/04/23 1130)  Lowella Curb

## 2023-02-04 NOTE — Op Note (Signed)
PRE-OP DIAGNOSIS:  RIGHT HIP DEGENERATIVE JOINT DISEASE POST-OP DIAGNOSIS:  same PROCEDURE: RIGHT TOTAL HIP ARTHROPLASTY ANTERIOR APPROACH ANESTHESIA:  Spinal and MAC SURGEON:  Marcene Corning MD ASSISTANT:  Elodia Florence PA-C   INDICATIONS FOR PROCEDURE:  The patient is a 62 y.o. female with a long history of a painful hip.  This has persisted despite multiple conservative measures.  The patient has persisted with pain and dysfunction making rest and activity difficult.  A total hip replacement is offered as surgical treatment.  Informed operative consent was obtained after discussion of possible complications including reaction to anesthesia, infection, neurovascular injury, dislocation, DVT, PE, and death.  The importance of the postoperative rehab program to optimize result was stressed with the patient.  SUMMARY OF FINDINGS AND PROCEDURE:  Under the above anesthesia through a anterior approach an the Hana table a right THR was performed.  The patient had severe degenerative change and good bone quality.  We used DePuy components to replace the hip and these were size 5  Actis femur capped with a +% 36mm ceramic hip ball.  On the acetabular side we used a size 52 Gription shell with a  plus 4 neutral polyethylene liner.  We did use a hole eliminator.  Elodia Florence PA-C assisted throughout and was invaluable to the completion of the case in that he helped position and retract while I performed the procedure.  He also closed simultaneously to help minimize OR time.  I used fluoroscopy throughout the case to check position of implants and leg lengths and read all of these views myself.  DESCRIPTION OF PROCEDURE:  The patient was taken to the OR suite where the above anesthetic was applied.  The patient was then positioned on the Hana table supine.  All bony prominences were appropriately padded.  Prep and drape was then performed in normal sterile fashion.  The patient was given kefzol preoperative  antibiotic and an appropriate time out was performed.  We then took an anterior approach to the right hip.  Dissection was taken through adipose to the tensor fascia lata fascia.  This structure was incised longitudinally and we dissected in the intermuscular interval just medial to this muscle.  Cobra retractors were placed superior and inferior to the femoral neck superficial to the capsule.  A capsular incision was then made and the retractors were placed along the femoral neck.  Xray was brought in to get a good level for the femoral neck cut which was made with an oscillating saw and osteotome.  The femoral head was removed with a corkscrew.  The acetabulum was exposed and some labral tissues were excised. Reaming was taken to the inside wall of the pelvis and sequentially up to 1 mm smaller than the actual component.  A trial of components was done and then the aforementioned acetabular shell was placed in appropriate tilt and anteversion confirmed by fluoroscopy. The liner was placed along with the hole eliminator and attention was turned to the femur.  The leg was brought down and over into adduction and the elevator bar was used to raise the femur up gently in the wound.  The piriformis was released with care taken to preserve the obturator internus attachment and all of the posterior capsule. The femur was reamed and then broached to the appropriate size.  A trial reduction was done and the aforementioned head and neck assembly gave [place] the best stability in extension with external rotation.  Leg lengths were felt to be about  equal by fluoroscopic exam.  The trial components were removed and the wound irrigated.  We then placed the femoral component in appropriate anteversion.  The head was applied to a dry stem neck and the hip again reduced.  It was again stable in the aforementioned position.  The would was irrigated again followed by re-approximation of anterior capsule with ethibond suture. Tensor  fascia was repaired with V-loc suture  followed by deep closure with #O and #2 undyed vicryl.  Skin was closed with subQ stitch and steristrips followed by a sterile dressing.  EBL and IOF can be obtained from anesthesia records.  DISPOSITION:  The patient was taken to PACU in stable condition to potentially go home same day depending on ability to walk and tolerate liquids.

## 2023-02-05 ENCOUNTER — Observation Stay (HOSPITAL_BASED_OUTPATIENT_CLINIC_OR_DEPARTMENT_OTHER): Payer: Medicare HMO

## 2023-02-05 ENCOUNTER — Encounter (HOSPITAL_COMMUNITY): Payer: Self-pay | Admitting: Orthopaedic Surgery

## 2023-02-05 ENCOUNTER — Observation Stay (HOSPITAL_COMMUNITY): Payer: Medicare HMO

## 2023-02-05 DIAGNOSIS — R079 Chest pain, unspecified: Secondary | ICD-10-CM

## 2023-02-05 DIAGNOSIS — M1611 Unilateral primary osteoarthritis, right hip: Secondary | ICD-10-CM

## 2023-02-05 DIAGNOSIS — I10 Essential (primary) hypertension: Secondary | ICD-10-CM | POA: Diagnosis not present

## 2023-02-05 DIAGNOSIS — E782 Mixed hyperlipidemia: Secondary | ICD-10-CM

## 2023-02-05 DIAGNOSIS — R072 Precordial pain: Secondary | ICD-10-CM | POA: Diagnosis not present

## 2023-02-05 DIAGNOSIS — Z96641 Presence of right artificial hip joint: Secondary | ICD-10-CM | POA: Diagnosis not present

## 2023-02-05 DIAGNOSIS — Z8249 Family history of ischemic heart disease and other diseases of the circulatory system: Secondary | ICD-10-CM

## 2023-02-05 DIAGNOSIS — F1290 Cannabis use, unspecified, uncomplicated: Secondary | ICD-10-CM

## 2023-02-05 DIAGNOSIS — K219 Gastro-esophageal reflux disease without esophagitis: Secondary | ICD-10-CM

## 2023-02-05 LAB — ECHOCARDIOGRAM COMPLETE
Area-P 1/2: 4.23 cm2
Calc EF: 70.9 %
Height: 68 in
S' Lateral: 2.3 cm
Single Plane A2C EF: 74 %
Single Plane A4C EF: 73.2 %
Weight: 3502.67 [oz_av]

## 2023-02-05 LAB — CBC
HCT: 27.4 % — ABNORMAL LOW (ref 36.0–46.0)
Hemoglobin: 8.6 g/dL — ABNORMAL LOW (ref 12.0–15.0)
MCH: 29.2 pg (ref 26.0–34.0)
MCHC: 31.4 g/dL (ref 30.0–36.0)
MCV: 92.9 fL (ref 80.0–100.0)
Platelets: 156 10*3/uL (ref 150–400)
RBC: 2.95 MIL/uL — ABNORMAL LOW (ref 3.87–5.11)
RDW: 16.3 % — ABNORMAL HIGH (ref 11.5–15.5)
WBC: 15.6 10*3/uL — ABNORMAL HIGH (ref 4.0–10.5)
nRBC: 0 % (ref 0.0–0.2)

## 2023-02-05 LAB — BASIC METABOLIC PANEL
Anion gap: 10 (ref 5–15)
BUN: 21 mg/dL (ref 8–23)
CO2: 25 mmol/L (ref 22–32)
Calcium: 8.3 mg/dL — ABNORMAL LOW (ref 8.9–10.3)
Chloride: 101 mmol/L (ref 98–111)
Creatinine, Ser: 1.01 mg/dL — ABNORMAL HIGH (ref 0.44–1.00)
GFR, Estimated: >60 mL/min (ref >60)
Glucose, Bld: 212 mg/dL — ABNORMAL HIGH (ref 70–99)
Potassium: 3.6 mmol/L (ref 3.5–5.1)
Sodium: 136 mmol/L (ref 135–145)

## 2023-02-05 LAB — BRAIN NATRIURETIC PEPTIDE: B Natriuretic Peptide: 68.1 pg/mL (ref 0.0–100.0)

## 2023-02-05 MED ORDER — IOHEXOL 350 MG/ML SOLN
75.0000 mL | Freq: Once | INTRAVENOUS | Status: AC | PRN
  Administered 2023-02-05: 75 mL via INTRAVENOUS

## 2023-02-05 MED ORDER — SODIUM CHLORIDE (PF) 0.9 % IJ SOLN
INTRAMUSCULAR | Status: AC
  Filled 2023-02-05: qty 50

## 2023-02-05 NOTE — Progress Notes (Signed)
Holding Zestril 40mg  and Lasix 20 mg Per A Nida PA     [employer]

## 2023-02-05 NOTE — Progress Notes (Signed)
Physical Therapy Treatment Patient Details Name: Shannon Stuart MRN: [id number] DOB: 23-Dec-1960 Today's Date: 02/05/2023   History of Present Illness 62 yo female presents to therapy s/p R THA, anterior approach on 02/04/2023 due to failure of conservative measures. Pt PMH includes but is not limited to: chronic pain, L HOH, L THA (2023), chronic pain, Kappa light chain dz, HLD, GAD, GERD, HTN, CAD, lumbar surgery and brain tumor excision.    PT Comments  Cardiology PA & Nurse cleared pt for bed level exercises until CTA results are in. Pt seen for modified session. Pt performed RLE strengthening exercises with PRN AAROM but good tolerance overall. Will f/u with pt tomorrow AM in hopes of OOB mobility for gait & stair training.    If plan is discharge home, recommend the following: A lot of help with bathing/dressing/bathroom   Can travel by private vehicle        Equipment Recommendations  None recommended by PT    Recommendations for Other Services       Precautions / Restrictions Precautions Precautions: Fall Restrictions Weight Bearing Restrictions: Yes RLE Weight Bearing: Weight bearing as tolerated     Mobility  Bed Mobility Overal bed mobility: Needs Assistance Bed Mobility: Supine to Sit, Sit to Supine     Supine to sit: HOB elevated, Supervision, Used rails Sit to supine: Min assist, Used rails, HOB elevated (to elevate RLE onto bed)        Transfers Overall transfer level: Needs assistance Equipment used: Rolling walker (2 wheels) Transfers: Sit to/from Stand Sit to Stand: Supervision           General transfer comment: STS from EOB, recliner with pt counting "1-2-3-" prior to standing but does well pushing to stand with BUE    Ambulation/Gait Ambulation/Gait assistance: Contact guard assist Gait Distance (Feet): 30 Feet Assistive device: Rolling walker (2 wheels) Gait Pattern/deviations: Decreased step length - left, Decreased step length - right,  Decreased stride length, Decreased weight shift to right Gait velocity: decreased     General Gait Details: Gait limited by pt c/o feeling dizzy.   Stairs             Wheelchair Mobility     Tilt Bed    Modified Rankin (Stroke Patients Only)       Balance Overall balance assessment: Needs assistance Sitting-balance support: Feet supported Sitting balance-Leahy Scale: Good     Standing balance support: Bilateral upper extremity supported, During functional activity, Reliant on assistive device for balance Standing balance-Leahy Scale: Fair                              Cognition Arousal: Alert Behavior During Therapy: WFL for tasks assessed/performed Overall Cognitive Status: Within Functional Limits for tasks assessed                                 General Comments: motivated to participate, eager to get better & d/c        Exercises Total Joint Exercises Towel Squeeze: AROM, Strengthening, 10 reps, Supine (hip adduction pillow squeezes) Heel Slides: AROM, Strengthening, Right, 10 reps, Supine Hip ABduction/ADduction: AAROM, Strengthening, Supine, Right, 10 reps Straight Leg Raises: AAROM, Strengthening, Right, 10 reps Long Arc Quad: AROM, Strengthening, Right, 5 reps, Seated    General Comments        Pertinent Vitals/Pain Pain Assessment Pain Assessment: 0-10  Pain Score: 9  Faces Pain Scale: Hurts a little bit Pain Location: R hip Pain Descriptors / Indicators: Discomfort Pain Intervention(s): Monitored during session, Limited activity within patient's tolerance    Home Living                          Prior Function            PT Goals (current goals can now be found in the care plan section) Acute Rehab PT Goals Patient Stated Goal: loose weight and be able to return to a walking fitness program in addition to walking the dog PT Goal Formulation: With patient Time For Goal Achievement:  02/18/23 Potential to Achieve Goals: Good Progress towards PT goals: Progressing toward goals    Frequency    7X/week      PT Plan      Co-evaluation              AM-PAC PT "6 Clicks" Mobility   Outcome Measure  Help needed turning from your back to your side while in a flat bed without using bedrails?: None Help needed moving from lying on your back to sitting on the side of a flat bed without using bedrails?: A Little Help needed moving to and from a bed to a chair (including a wheelchair)?: A Little Help needed standing up from a chair using your arms (e.g., wheelchair or bedside chair)?: A Little Help needed to walk in hospital room?: A Little Help needed climbing 3-5 steps with a railing? : A Little 6 Click Score: 19    End of Session   Activity Tolerance: Patient tolerated treatment well Patient left: in bed;with call bell/phone within reach;with bed alarm set Nurse Communication: Mobility status PT Visit Diagnosis: Unsteadiness on feet (R26.81);Other abnormalities of gait and mobility (R26.89);Muscle weakness (generalized) (M62.81);Difficulty in walking, not elsewhere classified (R26.2);Pain Pain - Right/Left: Right Pain - part of body: Hip     Time: 6063-0160 PT Time Calculation (min) (ACUTE ONLY): 8 min  Charges:    $Therapeutic Exercise: 8-22 mins PT General Charges $$ ACUTE PT VISIT: 1 Visit                     Aleda Grana, PT, DPT 02/05/23, 4:02 PM   Sandi Mariscal 02/05/2023, 4:02 PM

## 2023-02-05 NOTE — TOC Transition Note (Signed)
Transition of Care [facility]) - CM/SW Discharge Note   Patient Details  Name: Shannon Stuart MRN: [id number] Date of Birth: 1960/07/02  [facility] CM/SW Contact:  Amada Jupiter, LCSW Phone Number: 02/05/2023, 9:29 AM   Clinical Narrative:     Met with pt who confirms she has needed DME in the home.  Pt aware that HHPT was prearranged with [facility] via ortho MD office prior to surgery.  No further TOC needs.  Final next level of care: Home w Home Health Services Barriers to Discharge: No Barriers Identified   Patient Goals and CMS Choice      Discharge Placement                         Discharge Plan and Services Additional resources added to the After Visit Summary for                  DME Arranged: N/A DME Agency: NA                  Social Determinants of Health (SDOH) Interventions SDOH Screenings   Food Insecurity: No Food Insecurity (02/04/2023)  Housing: Low Risk  (02/04/2023)  Transportation Needs: No Transportation Needs (02/04/2023)  Utilities: Not At Risk (02/04/2023)  Alcohol Screen: Low Risk  (09/24/2022)  Depression (PHQ2-9): Low Risk  (01/01/2023)  Financial Resource Strain: Medium Risk (09/24/2022)  Physical Activity: Inactive (09/24/2022)  Social Connections: Moderately Integrated (09/24/2022)  Stress: Stress Concern Present (09/24/2022)  Tobacco Use: Medium Risk (02/04/2023)     Readmission Risk Interventions     No data to display

## 2023-02-05 NOTE — Progress Notes (Signed)
Physical Therapy Treatment Patient Details Name: Shannon Stuart MRN: [id number] DOB: 27-Aug-1960 Today's Date: 02/05/2023   History of Present Illness 62 yo female presents to therapy s/p R THA, anterior approach on 02/04/2023 due to failure of conservative measures. Pt PMH includes but is not limited to: chronic pain, L HOH, L THA (2023), chronic pain, Kappa light chain dz, HLD, GAD, GERD, HTN, CAD, lumbar surgery and brain tumor excision.    PT Comments  Pt seen for PT tx with pt agreeable, motivated to participate. Pt is able to complete bed mobility with up to min assist for sit>supine 2/2 assistance to elevate RLE onto bed. Pt ambulates in hallway with RW & CGA but PT limits gait distance 2/2 pt c/o dizziness that worsens once sitting.   BP in LUE sitting: 142/84 mmHg MAP 99 HR 105 bpm with standing  SpO2 >/= 90% except did drop to 86% during sit>supine but increased >/= 90%  Pt reports stabbing pain in L side of chest. She told this PT that pt had experienced these symptoms prior to sx & f/u with PCP who completed an EKG that pt reports was "iffy" & PCP told pt to take it easy & not exert herself, as pt reported pain occurred with exertion. Pt does report feeling dizzy is new that has started since having sx. Cardiology PA made aware via secure chat.   If plan is discharge home, recommend the following: A little help with walking and/or transfers;A little help with bathing/dressing/bathroom;Assistance with cooking/housework;Assist for transportation;Help with stairs or ramp for entrance   Can travel by private vehicle        Equipment Recommendations  None recommended by PT    Recommendations for Other Services       Precautions / Restrictions Precautions Precautions: Fall Restrictions Weight Bearing Restrictions: Yes RLE Weight Bearing: Weight bearing as tolerated     Mobility  Bed Mobility Overal bed mobility: Needs Assistance Bed Mobility: Supine to Sit, Sit to Supine      Supine to sit: HOB elevated, Supervision, Used rails Sit to supine: Min assist, Used rails, HOB elevated (to elevate RLE onto bed)        Transfers Overall transfer level: Needs assistance Equipment used: Rolling walker (2 wheels) Transfers: Sit to/from Stand Sit to Stand: Supervision           General transfer comment: STS from EOB, recliner with pt counting "1-2-3-" prior to standing but does well pushing to stand with BUE    Ambulation/Gait Ambulation/Gait assistance: Contact guard assist Gait Distance (Feet): 30 Feet Assistive device: Rolling walker (2 wheels) Gait Pattern/deviations: Decreased step length - left, Decreased step length - right, Decreased stride length, Decreased weight shift to right Gait velocity: decreased     General Gait Details: Gait limited by pt c/o feeling dizzy.   Stairs             Wheelchair Mobility     Tilt Bed    Modified Rankin (Stroke Patients Only)       Balance Overall balance assessment: Needs assistance Sitting-balance support: Feet supported Sitting balance-Leahy Scale: Good     Standing balance support: Bilateral upper extremity supported, During functional activity, Reliant on assistive device for balance Standing balance-Leahy Scale: Fair                              Cognition Arousal: Alert Behavior During Therapy: WFL for tasks assessed/performed Overall Cognitive  Status: Within Functional Limits for tasks assessed                                 General Comments: motivated to participate, eager to get better & d/c        Exercises Total Joint Exercises Long Arc Quad: AROM, Strengthening, Right, 5 reps, Seated    General Comments        Pertinent Vitals/Pain Pain Assessment Pain Assessment: Faces Faces Pain Scale: Hurts a little bit Pain Location: R hip with movement, L side of chest Pain Descriptors / Indicators: Stabbing, Throbbing Pain Intervention(s):  Monitored during session    Home Living                          Prior Function            PT Goals (current goals can now be found in the care plan section) Acute Rehab PT Goals Patient Stated Goal: loose weight and be able to return to a walking fitness program in addition to walking the dog PT Goal Formulation: With patient Time For Goal Achievement: 02/18/23 Potential to Achieve Goals: Good Progress towards PT goals: Progressing toward goals    Frequency    7X/week      PT Plan      Co-evaluation              AM-PAC PT "6 Clicks" Mobility   Outcome Measure  Help needed turning from your back to your side while in a flat bed without using bedrails?: None Help needed moving from lying on your back to sitting on the side of a flat bed without using bedrails?: A Little Help needed moving to and from a bed to a chair (including a wheelchair)?: A Little Help needed standing up from a chair using your arms (e.g., wheelchair or bedside chair)?: A Little Help needed to walk in hospital room?: A Little Help needed climbing 3-5 steps with a railing? : A Little 6 Click Score: 19    End of Session Equipment Utilized During Treatment: Gait belt Activity Tolerance: Treatment limited secondary to medical complications (Comment) Patient left: in bed;with call bell/phone within reach;with bed alarm set;with SCD's reapplied   PT Visit Diagnosis: Unsteadiness on feet (R26.81);Other abnormalities of gait and mobility (R26.89);Muscle weakness (generalized) (M62.81);Difficulty in walking, not elsewhere classified (R26.2);Pain Pain - Right/Left: Right Pain - part of body: Hip     Time: 0920-0937 PT Time Calculation (min) (ACUTE ONLY): 17 min  Charges:    $Therapeutic Activity: 8-22 mins PT General Charges $$ ACUTE PT VISIT: 1 Visit                     Aleda Grana, PT, DPT 02/05/23, 9:55 AM   Sandi Mariscal 02/05/2023, 9:51 AM

## 2023-02-05 NOTE — Progress Notes (Signed)
Subjective: 1 Day Post-Op Procedure(s) (LRB): RIGHT TOTAL HIP ARTHROPLASTY ANTERIOR APPROACH (Right)  Patients hip is feeling better this morning. After surgery she was having chest pain in the PACU. There was one abnormal EKG but the arm leads were reversed. Once they were corrected the EKG was normal after that. I called and spoke to cardiology as patient stated that it felt like someone was sitting on her chest. Troponins were drawn which came back as normal and since then the patients oxygen levels have been normal while on 2L oxygen by nasal cannula. Her pain was worse yesterday with inspiration and the thought of cardiology at the time was it was pleuritic chest pain from a likely fatty pulmonary emboli that occurred during surgery. This morning she has no chest pain at rest but still does have some on her left side near her breast with deep inspiration.   Activity level:  wbat Diet tolerance:  ok Voiding:  ok Patient reports pain as mild and moderate.    Objective: Vital signs in last 24 hours: Temp:  [97.8 F (36.6 C)-98.8 F (37.1 C)] 98.1 F (36.7 C) (11/13 0557) Pulse Rate:  [76-97] 87 (11/13 0557) Resp:  [11-18] 17 (11/13 0557) BP: (109-148)/(76-103) 109/90 (11/13 0557) SpO2:  [87 %-100 %] 100 % (11/13 0557) Weight:  [99.3 kg] 99.3 kg (11/12 1323)  Labs: No results for input(s): "HGB" in the last 72 hours. No results for input(s): "WBC", "RBC", "HCT", "PLT" in the last 72 hours. No results for input(s): "NA", "K", "CL", "CO2", "BUN", "CREATININE", "GLUCOSE", "CALCIUM" in the last 72 hours. No results for input(s): "LABPT", "INR" in the last 72 hours.  Physical Exam:  Neurologically intact ABD soft Neurovascular intact Sensation intact distally Intact pulses distally Dorsiflexion/Plantar flexion intact Incision: dressing C/D/I and no drainage No cellulitis present Compartment soft  Assessment/Plan:  1 Day Post-Op Procedure(s) (LRB): RIGHT TOTAL HIP ARTHROPLASTY  ANTERIOR APPROACH (Right) Advance diet Up with therapy D/C IV fluids We will stop her nasal oxygen and see how her oxygen sats due with rest and also when she gets up to walk with PT. I will call and check on her this afternoon and see how she is feeling.  I will also reach out to cardiology about seeing the patient since there is continued chest pain to see if any further work up is indicated.  If she is cleared by cardiology and passes PT she could potentially go home but will likely stay one more night to make sure that her pain continues to improve and also that her oxygen levels do not drop.    Ginger Organ Briana Newman 02/05/2023, 8:07 AM

## 2023-02-05 NOTE — Consult Note (Addendum)
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 well as PPIs.  The discomfort that she currently has a similar to her prior events when she had her cath in 2009 and stresses in 2013.   Currently no active pain resting in bed comfortably watching TV.  PHYSICAL EXAM: Today's Vitals   02/05/23 1336 02/05/23 2045 02/05/23 2118 02/05/23 2139  BP: 111/77  123/83 123/83  Pulse: 89  85   Resp: 18  16   Temp: 98.3 F (36.8 C)  98.5 F (36.9 C)   TempSrc:      SpO2: 98%  96%   Weight:      Height:      PainSc:  10-Worst pain ever     Body mass index is 33.29 kg/m.   Net IO Since Admission: 25,077.89 mL [02/05/23 2214]  Filed Weights   02/04/23 0554 02/04/23 1323  Weight: 99.3 kg 99.3 kg    Physical Exam  Constitutional: No distress.  hemodynamically stable  Neck: No JVD present.  Cardiovascular: Normal rate, regular rhythm, S1 normal and S2 normal. Exam reveals no gallop, no S3 and no S4.  No murmur heard. Pulmonary/Chest: Effort normal and breath sounds  normal. No stridor. She has no wheezes. She has no rales.  Abdominal: Soft. Bowel sounds are normal. She exhibits no distension. There is no abdominal tenderness.  Musculoskeletal:        General: No edema.     Cervical back: Neck supple.  Neurological: She is alert and oriented to person, place, and time. She has intact cranial nerves (2-12).  Skin: Skin is warm.    EKG: (personally reviewed by me) 02/05/2023: Sinus rhythm, 84 bpm, without underlying injury pattern.  Telemetry: (personally reviewed by me) Sinus rhythm  Echocardiogram 02/05/2023: 02/05/2023  1. Left ventricular ejection fraction, by estimation, is 60 to 65%. The  left ventricle has normal function. The left ventricle has no regional  wall motion abnormalities. There is mild concentric left ventricular  hypertrophy. Left ventricular diastolic  parameters were normal.   2. Right ventricular systolic function is normal. The right ventricular  size is normal. Tricuspid regurgitation signal is inadequate for assessing  PA pressure.   3. The mitral valve is grossly normal. No evidence of mitral valve  regurgitation. No evidence of mitral stenosis.   4. The aortic valve is grossly normal. Aortic valve regurgitation is not  visualized. No aortic stenosis is present  CTA PE protocol 02/05/2023: Negative for PE.  See report for additional details   Impression:  Precordial pain Hypertension. Status post right hip replacement. Osteoarthritis. GERD. Hyperlipidemia. Family history of heart disease Marijuana consumption  Recommendations:  Precordial pain: Has mixed cardiac and noncardiac features. Chronicity months to years. EKG is nonischemic. High sensitive troponins negative x 2. BNP within normal limits. CT PE protocol negative for pulmonary embolism. Echocardiogram notes preserved LVEF, no obvious regional wall motion abnormalities or severe valvular heart disease. Given the postop anemia with a hemoglobin  presurgery 12.5 g/dL and now 3.6 g/dL a component of supply/demand ischemia cannot be ruled out. We discussed proceeding forward with coronary CTA during this hospitalization OR arranging it as outpatient with post follow-up.  Patient prefers the latter option. I will have the office arrange coronary CTA as outpatient and follow-up with either myself or APP to review the results and to reevaluate the patient.  Patient is in agreement with the plan of care. She is educated  on seeking medical attention sooner by going to the closest ER via EMS if the symptoms increase in intensity, frequency, duration, or has typical chest pain as discussed in the office.  Patient verbalized understanding.  Benign essential hypertension: Continue home medications  Hyperlipidemia: Continue rosuvastatin.  Check fasting lipids  Marijuana consumption: Reemphasized importance of complete cessation.  Further recommendations to follow as the case evolves.   This note was created using a voice recognition software as a result there may be grammatical errors inadvertently enclosed that do not reflect the nature of this encounter. Every attempt is made to correct such errors.   Tessa Lerner, DO, [facility]  [facility]  [address] [place], [place] [address] Pager: [phone] Office: [phone] 02/05/2023 10:14 PM

## 2023-02-05 NOTE — Progress Notes (Signed)
  Echocardiogram 2D Echocardiogram has been performed.  Shannon Stuart 02/05/2023, 3:52 PM

## 2023-02-05 NOTE — Progress Notes (Signed)
CTA reassuring, no acute PE. Await final MD recs. Cardiology will round in AM as well.

## 2023-02-06 ENCOUNTER — Other Ambulatory Visit: Payer: Self-pay | Admitting: Physician Assistant

## 2023-02-06 DIAGNOSIS — D62 Acute posthemorrhagic anemia: Secondary | ICD-10-CM | POA: Diagnosis not present

## 2023-02-06 DIAGNOSIS — Y838 Other surgical procedures as the cause of abnormal reaction of the patient, or of later complication, without mention of misadventure at the time of the procedure: Secondary | ICD-10-CM | POA: Diagnosis not present

## 2023-02-06 DIAGNOSIS — Z8249 Family history of ischemic heart disease and other diseases of the circulatory system: Secondary | ICD-10-CM | POA: Diagnosis not present

## 2023-02-06 DIAGNOSIS — Z5986 Financial insecurity: Secondary | ICD-10-CM | POA: Diagnosis not present

## 2023-02-06 DIAGNOSIS — N809 Endometriosis, unspecified: Secondary | ICD-10-CM | POA: Diagnosis present

## 2023-02-06 DIAGNOSIS — E669 Obesity, unspecified: Secondary | ICD-10-CM | POA: Diagnosis present

## 2023-02-06 DIAGNOSIS — I2510 Atherosclerotic heart disease of native coronary artery without angina pectoris: Secondary | ICD-10-CM | POA: Diagnosis present

## 2023-02-06 DIAGNOSIS — Z6832 Body mass index (BMI) 32.0-32.9, adult: Secondary | ICD-10-CM | POA: Diagnosis not present

## 2023-02-06 DIAGNOSIS — I872 Venous insufficiency (chronic) (peripheral): Secondary | ICD-10-CM | POA: Diagnosis present

## 2023-02-06 DIAGNOSIS — G4730 Sleep apnea, unspecified: Secondary | ICD-10-CM | POA: Diagnosis present

## 2023-02-06 DIAGNOSIS — M1611 Unilateral primary osteoarthritis, right hip: Secondary | ICD-10-CM | POA: Diagnosis present

## 2023-02-06 DIAGNOSIS — I10 Essential (primary) hypertension: Secondary | ICD-10-CM | POA: Diagnosis present

## 2023-02-06 DIAGNOSIS — M1711 Unilateral primary osteoarthritis, right knee: Secondary | ICD-10-CM | POA: Diagnosis present

## 2023-02-06 DIAGNOSIS — F32A Depression, unspecified: Secondary | ICD-10-CM | POA: Diagnosis present

## 2023-02-06 DIAGNOSIS — F419 Anxiety disorder, unspecified: Secondary | ICD-10-CM | POA: Diagnosis present

## 2023-02-06 DIAGNOSIS — H9192 Unspecified hearing loss, left ear: Secondary | ICD-10-CM | POA: Diagnosis present

## 2023-02-06 DIAGNOSIS — E785 Hyperlipidemia, unspecified: Secondary | ICD-10-CM | POA: Diagnosis present

## 2023-02-06 DIAGNOSIS — R072 Precordial pain: Secondary | ICD-10-CM | POA: Diagnosis not present

## 2023-02-06 DIAGNOSIS — G8929 Other chronic pain: Secondary | ICD-10-CM | POA: Diagnosis present

## 2023-02-06 DIAGNOSIS — F1290 Cannabis use, unspecified, uncomplicated: Secondary | ICD-10-CM | POA: Diagnosis present

## 2023-02-06 DIAGNOSIS — K219 Gastro-esophageal reflux disease without esophagitis: Secondary | ICD-10-CM | POA: Diagnosis present

## 2023-02-06 DIAGNOSIS — R7303 Prediabetes: Secondary | ICD-10-CM | POA: Diagnosis present

## 2023-02-06 DIAGNOSIS — Z9181 History of falling: Secondary | ICD-10-CM | POA: Diagnosis not present

## 2023-02-06 DIAGNOSIS — M545 Low back pain, unspecified: Secondary | ICD-10-CM | POA: Diagnosis present

## 2023-02-06 DIAGNOSIS — R071 Chest pain on breathing: Secondary | ICD-10-CM | POA: Diagnosis present

## 2023-02-06 DIAGNOSIS — R079 Chest pain, unspecified: Secondary | ICD-10-CM

## 2023-02-06 DIAGNOSIS — Z96642 Presence of left artificial hip joint: Secondary | ICD-10-CM | POA: Diagnosis present

## 2023-02-06 DIAGNOSIS — K760 Fatty (change of) liver, not elsewhere classified: Secondary | ICD-10-CM | POA: Diagnosis present

## 2023-02-06 LAB — CBC
HCT: 25.6 % — ABNORMAL LOW (ref 36.0–46.0)
Hemoglobin: 8 g/dL — ABNORMAL LOW (ref 12.0–15.0)
MCH: 29.3 pg (ref 26.0–34.0)
MCHC: 31.3 g/dL (ref 30.0–36.0)
MCV: 93.8 fL (ref 80.0–100.0)
Platelets: 147 10*3/uL — ABNORMAL LOW (ref 150–400)
RBC: 2.73 MIL/uL — ABNORMAL LOW (ref 3.87–5.11)
RDW: 16.6 % — ABNORMAL HIGH (ref 11.5–15.5)
WBC: 12.8 10*3/uL — ABNORMAL HIGH (ref 4.0–10.5)
nRBC: 0.2 % (ref 0.0–0.2)

## 2023-02-06 LAB — BASIC METABOLIC PANEL
Anion gap: 8 (ref 5–15)
BUN: 24 mg/dL — ABNORMAL HIGH (ref 8–23)
CO2: 26 mmol/L (ref 22–32)
Calcium: 8.3 mg/dL — ABNORMAL LOW (ref 8.9–10.3)
Chloride: 103 mmol/L (ref 98–111)
Creatinine, Ser: 0.89 mg/dL (ref 0.44–1.00)
GFR, Estimated: >60 mL/min (ref >60)
Glucose, Bld: 164 mg/dL — ABNORMAL HIGH (ref 70–99)
Potassium: 3.9 mmol/L (ref 3.5–5.1)
Sodium: 137 mmol/L (ref 135–145)

## 2023-02-06 LAB — LIPID PANEL
Cholesterol: 95 mg/dL (ref 0–200)
HDL: 41 mg/dL (ref >40)
LDL Cholesterol: 27 mg/dL (ref 0–99)
Total CHOL/HDL Ratio: 2.3 {ratio}
Triglycerides: 134 mg/dL (ref <150)
VLDL: 27 mg/dL (ref 0–40)

## 2023-02-06 LAB — HEMOGLOBIN AND HEMATOCRIT, BLOOD
HCT: 30.7 % — ABNORMAL LOW (ref 36.0–46.0)
Hemoglobin: 9.8 g/dL — ABNORMAL LOW (ref 12.0–15.0)

## 2023-02-06 LAB — PREPARE RBC (CROSSMATCH)

## 2023-02-06 MED ORDER — SODIUM CHLORIDE 0.9% IV SOLUTION: Freq: Once | INTRAVENOUS | Status: AC

## 2023-02-06 MED ORDER — METOPROLOL TARTRATE 100 MG PO TABS: 100.0000 mg | ORAL_TABLET | Freq: Once | ORAL | 0 refills | Status: DC

## 2023-02-06 NOTE — Progress Notes (Signed)
Physical Therapy Treatment Patient Details Name: Shannon Stuart MRN: [id number] DOB: [birth year] Today's Date: 02/06/2023   History of Present Illness 62 yo female presents to therapy s/p R THA, anterior approach on 02/04/2023 due to failure of conservative measures. Pt PMH includes but is not limited to: chronic pain, L HOH, L THA (2023), chronic pain, Kappa light chain dz, HLD, GAD, GERD, HTN, CAD, lumbar surgery and brain tumor excision.    PT Comments  Pt s/p unit of PRBCs and reports feeling about the same.  Pt ambulated into hallway however distance limited by fatigue and dizziness.  Pt reports dizziness only mildly improved this afternoon.  SpO2 86% on room air upon return to bed however quickly increased to 97% (possible error in reading has monitor on finger and pt gripping RW).  Pt has been wearing oxygen throughout the day, now s/p blood, and does not wear oxygen at home.  Pt agreeable to try not wearing O2  at rest.  Pt on continuous pulse ox by telemetry and RN notified.  Pt anticipates d/c home tomorrow.    If plan is discharge home, recommend the following: A little help with walking and/or transfers;A little help with bathing/dressing/bathroom   Can travel by private vehicle        Equipment Recommendations  None recommended by PT    Recommendations for Other Services       Precautions / Restrictions Precautions Precautions: Fall Restrictions RLE Weight Bearing: Weight bearing as tolerated     Mobility  Bed Mobility Overal bed mobility: Needs Assistance Bed Mobility: Supine to Sit, Sit to Supine     Supine to sit: Supervision Sit to supine: Supervision   General bed mobility comments: pt self assisted Rt LE with gait belt    Transfers Overall transfer level: Needs assistance Equipment used: Rolling walker (2 wheels) Transfers: Sit to/from Stand Sit to Stand: Contact guard assist           General transfer comment: pt counts "1-2-3-" prior to  standing but does well pushing to stand with BUE, effortful movement    Ambulation/Gait Ambulation/Gait assistance: Contact guard assist Gait Distance (Feet): 25 Feet Assistive device: Rolling walker (2 wheels) Gait Pattern/deviations: Step-to pattern, Decreased stance time - right, Antalgic Gait velocity: decreased     General Gait Details: cues for sequence, RW positioning; distance to tolerance, pt reports shaky UEs, dizziness still present this afternoon but pt reports mild improvement; SpO2 86% on room air upon return to bed however quickly increased to 97% (possible error in reading has monitor on finger and pt gripping RW)   Stairs             Wheelchair Mobility     Tilt Bed    Modified Rankin (Stroke Patients Only)       Balance                                            Cognition Arousal: Alert Behavior During Therapy: WFL for tasks assessed/performed Overall Cognitive Status: Within Functional Limits for tasks assessed                                 General Comments: motivated to participate, eager to get better & d/c        Exercises Total Joint Exercises Ankle  Circles/Pumps: AROM, Both, 10 reps Quad Sets: AROM, Both, 10 reps Heel Slides: Right, 10 reps, Supine, AAROM Hip ABduction/ADduction: AAROM, Right, 10 reps Long Arc Quad: AROM, Strengthening, Right, Seated, 10 reps    General Comments        Pertinent Vitals/Pain Pain Assessment Pain Assessment: 0-10 Pain Score: 7  Pain Location: R hip, continues to report left chest pain Pain Descriptors / Indicators: Discomfort, Sore Pain Intervention(s): Monitored during session, Repositioned    Home Living                          Prior Function            PT Goals (current goals can now be found in the care plan section) Progress towards PT goals: Progressing toward goals    Frequency    7X/week      PT Plan      Co-evaluation               AM-PAC PT "6 Clicks" Mobility   Outcome Measure  Help needed turning from your back to your side while in a flat bed without using bedrails?: None Help needed moving from lying on your back to sitting on the side of a flat bed without using bedrails?: A Little Help needed moving to and from a bed to a chair (including a wheelchair)?: A Little Help needed standing up from a chair using your arms (e.g., wheelchair or bedside chair)?: A Little Help needed to walk in hospital room?: A Little Help needed climbing 3-5 steps with a railing? : A Little 6 Click Score: 19    End of Session Equipment Utilized During Treatment: Gait belt Activity Tolerance: Patient tolerated treatment well Patient left: in bed;with call bell/phone within reach;with bed alarm set;with family/visitor present Nurse Communication: Mobility status PT Visit Diagnosis: Difficulty in walking, not elsewhere classified (R26.2);Pain Pain - Right/Left: Right Pain - part of body: Hip     Time: 1415-1431 PT Time Calculation (min) (ACUTE ONLY): 16 min  Charges:    $Gait Training: 8-22 mins PT General Charges $$ ACUTE PT VISIT: 1 Visit                     Paulino Door, DPT Physical Therapist Acute Rehabilitation Services Office: [phone]  [name] 02/06/2023, 4:47 PM

## 2023-02-06 NOTE — Progress Notes (Signed)
Subjective: 2 Days Post-Op Procedure(s) (LRB): RIGHT TOTAL HIP ARTHROPLASTY ANTERIOR APPROACH (Right)  Patient is feeling a little better this morning. She still feels a little bit weak and dizzy upon getting up with PT. Her Hgb is 8 this morning. Cardiology continues to follow and her CTA yesterday was negative for PE.   Activity level:  wbat Diet tolerance:  ok Voiding:  ok Patient reports pain as moderate.    Objective: Vital signs in last 24 hours: Temp:  [98.1 F (36.7 C)-98.7 F (37.1 C)] 98.1 F (36.7 C) (11/14 0544) Pulse Rate:  [82-91] 82 (11/14 0544) Resp:  [15-18] 15 (11/14 0544) BP: (111-123)/(77-99) 117/84 (11/14 0544) SpO2:  [96 %-99 %] 99 % (11/14 0834)  Labs: Recent Labs    02/05/23 1034 02/06/23 0332  HGB 8.6* 8.0*   Recent Labs    02/05/23 1034 02/06/23 0332  WBC 15.6* 12.8*  RBC 2.95* 2.73*  HCT 27.4* 25.6*  PLT 156 147*   Recent Labs    02/05/23 1034 02/06/23 0332  NA 136 137  K 3.6 3.9  CL 101 103  CO2 25 26  BUN 21 24*  CREATININE 1.01* 0.89  GLUCOSE 212* 164*  CALCIUM 8.3* 8.3*   No results for input(s): "LABPT", "INR" in the last 72 hours.  Physical Exam:  Neurologically intact ABD soft Neurovascular intact Sensation intact distally Intact pulses distally Dorsiflexion/Plantar flexion intact Incision: dressing C/D/I and no drainage No cellulitis present Compartment soft  Assessment/Plan:  2 Days Post-Op Procedure(s) (LRB): RIGHT TOTAL HIP ARTHROPLASTY ANTERIOR APPROACH (Right) Advance diet Up with therapy Plan for discharge tomorrow if doing well and cleared by cardiology and PT. I am going to give a unit of blood today to help with patients symptomatic anemia.  I greatly appreciate cardiology's input on this patients care.    Ginger Organ Ason Heslin 02/06/2023, 8:59 AM

## 2023-02-06 NOTE — Progress Notes (Addendum)
Rounding Note    Patient Name: Shannon Stuart Date of Encounter: 02/06/2023  [facility] Cardiologist: Tessa Lerner, DO   Subjective   Substernal area sore with inspiration. Has chronic left sided chest pain under left breast. Otherwise doing ok, no SOB.   Inpatient Medications    Scheduled Meds:  sodium chloride   Intravenous Once   ALPRAZolam  1 mg Oral TID   amLODipine  5 mg Oral QHS   aspirin  81 mg Oral BID   docusate sodium  100 mg Oral BID   furosemide  20 mg Oral Daily   lisinopril  40 mg Oral Daily   pantoprazole  40 mg Oral Daily   rosuvastatin  40 mg Oral Daily   traZODone  100 mg Oral QHS   Continuous Infusions:  PRN Meds: acetaminophen, alum & mag hydroxide-simeth, bisacodyl, diphenhydrAMINE, HYDROmorphone (DILAUDID) injection, menthol-cetylpyridinium **OR** phenol, methocarbamol **OR** methocarbamol (ROBAXIN) injection, metoCLOPramide **OR** metoCLOPramide (REGLAN) injection, oxyCODONE, oxyCODONE   Vital Signs    Vitals:   02/05/23 2139 02/06/23 0145 02/06/23 0544 02/06/23 0834  BP: 123/83 115/82 117/84   Pulse:  85 82   Resp:  17 15   Temp:  98.7 F (37.1 C) 98.1 F (36.7 C)   TempSrc:      SpO2:  99% 98% 99%  Weight:      Height:        Intake/Output Summary (Last 24 hours) at 02/06/2023 1055 Last data filed at 02/06/2023 0834 Gross per 24 hour  Intake 24998.06 ml  Output 2700 ml  Net 22298.06 ml      02/04/2023    1:23 PM 02/04/2023    5:54 AM 01/22/2023   12:51 PM  Last 3 Weights  Weight (lbs) 218 lb 14.7 oz 219 lb 219 lb  Weight (kg) 99.3 kg 99.338 kg 99.338 kg      Telemetry    NSR without significant ventricular ectopy, HR 80-90s - Personally Reviewed  ECG    NSR without significant ST-T wave changes - Personally Reviewed  Physical Exam   GEN: No acute distress.   Neck: No JVD Cardiac: RRR, no murmurs, rubs, or gallops.  Respiratory: Clear to auscultation bilaterally. GI: Soft, nontender, non-distended   MS: No edema; No deformity. Neuro:  Nonfocal  Psych: Normal affect   Labs    High Sensitivity Troponin:   Recent Labs  Lab 02/04/23 1025 02/04/23 1215  TROPONINIHS 6 6     Chemistry Recent Labs  Lab 02/05/23 1034 02/06/23 0332  NA 136 137  K 3.6 3.9  CL 101 103  CO2 25 26  GLUCOSE 212* 164*  BUN 21 24*  CREATININE 1.01* 0.89  CALCIUM 8.3* 8.3*  GFRNONAA >60 >60  ANIONGAP 10 8    Lipids  Recent Labs  Lab 02/06/23 0332  CHOL 95  TRIG 134  HDL 41  LDLCALC 27  CHOLHDL 2.3    Hematology Recent Labs  Lab 02/05/23 1034 02/06/23 0332  WBC 15.6* 12.8*  RBC 2.95* 2.73*  HGB 8.6* 8.0*  HCT 27.4* 25.6*  MCV 92.9 93.8  MCH 29.2 29.3  MCHC 31.4 31.3  RDW 16.3* 16.6*  PLT 156 147*   Thyroid No results for input(s): "TSH", "FREET4" in the last 168 hours.  BNP Recent Labs  Lab 02/05/23 1034  BNP 68.1    DDimer No results for input(s): "DDIMER" in the last 168 hours.   Radiology    CT Angio Chest Pulmonary Embolism (PE) W or  WO Contrast  Result Date: 02/05/2023 CLINICAL DATA:  Decreased oxygen saturation history of chest pain according to epic notes EXAM: CT ANGIOGRAPHY CHEST WITH CONTRAST TECHNIQUE: Multidetector CT imaging of the chest was performed using the standard protocol during bolus administration of intravenous contrast. Multiplanar CT image reconstructions and MIPs were obtained to evaluate the vascular anatomy. RADIATION DOSE REDUCTION: This exam was performed according to the departmental dose-optimization program which includes automated exposure control, adjustment of the mA and/or kV according to patient size and/or use of iterative reconstruction technique. CONTRAST:  75mL OMNIPAQUE IOHEXOL 350 MG/ML SOLN COMPARISON:  Chest x-ray 10/30/2021, CT 08/15/2020 FINDINGS: Cardiovascular: Satisfactory opacification of the pulmonary arteries to the segmental level. No evidence of pulmonary embolism. Nonaneurysmal aorta. No dissection seen. Mild  atherosclerosis. Normal cardiac size. No pericardial effusion Mediastinum/Nodes: No enlarged mediastinal, hilar, or axillary lymph nodes. Thyroid gland, trachea, and esophagus demonstrate no significant findings. Lungs/Pleura: Lungs are clear. No pleural effusion or pneumothorax. Upper Abdomen: No acute abnormality. Musculoskeletal: No chest wall abnormality. No acute or significant osseous findings. Review of the MIP images confirms the above findings. IMPRESSION: Negative. No CT evidence for acute pulmonary embolus. Clear lung fields. Electronically Signed   By: Jasmine Pang M.D.   On: 02/05/2023 16:16   ECHOCARDIOGRAM COMPLETE  Result Date: 02/05/2023    ECHOCARDIOGRAM REPORT   Patient Name:   Shannon Stuart Date of Exam: 02/05/2023 Medical Rec #:  [id number]     Height:       68.0 in Accession #:    [id number]    Weight:       218.9 lb Date of Birth:  Jan 03, 1961     BSA:          2.124 m Patient Age:    62 years      BP:           111/77 mmHg Patient Gender: F             HR:           83 bpm. Exam Location:  Inpatient Procedure: 2D Echo, Cardiac Doppler and Color Doppler Indications:    R07.9* Chest pain, unspecified  History:        Patient has prior history of Echocardiogram examinations, most                 recent 10/31/2022. CAD, Signs/Symptoms:Edema and                 Dizziness/Lightheadedness; Risk Factors:Hypertension,                 Dyslipidemia and Sleep Apnea.  Sonographer:    Sheralyn Boatman RDCS Referring Phys: 32 DAYNA N DUNN  Sonographer Comments: Technically difficult study due to poor echo windows and patient is obese. Image acquisition challenging due to patient body habitus and Image acquisition challenging due to respiratory motion. Patient supine post hip surgery. IMPRESSIONS  1. Left ventricular ejection fraction, by estimation, is 60 to 65%. The left ventricle has normal function. The left ventricle has no regional wall motion abnormalities. There is mild concentric left ventricular  hypertrophy. Left ventricular diastolic parameters were normal.  2. Right ventricular systolic function is normal. The right ventricular size is normal. Tricuspid regurgitation signal is inadequate for assessing PA pressure.  3. The mitral valve is grossly normal. No evidence of mitral valve regurgitation. No evidence of mitral stenosis.  4. The aortic valve is grossly normal. Aortic valve regurgitation is not visualized. No aortic stenosis  is present. FINDINGS  Left Ventricle: Left ventricular ejection fraction, by estimation, is 60 to 65%. The left ventricle has normal function. The left ventricle has no regional wall motion abnormalities. The left ventricular internal cavity size was normal in size. There is  mild concentric left ventricular hypertrophy. Left ventricular diastolic parameters were normal. Right Ventricle: The right ventricular size is normal. No increase in right ventricular wall thickness. Right ventricular systolic function is normal. Tricuspid regurgitation signal is inadequate for assessing PA pressure. Left Atrium: Left atrial size was normal in size. Right Atrium: Right atrial size was normal in size. Pericardium: There is no evidence of pericardial effusion. Mitral Valve: The mitral valve is grossly normal. No evidence of mitral valve regurgitation. No evidence of mitral valve stenosis. Tricuspid Valve: The tricuspid valve is grossly normal. Tricuspid valve regurgitation is not demonstrated. No evidence of tricuspid stenosis. Aortic Valve: The aortic valve is grossly normal. Aortic valve regurgitation is not visualized. No aortic stenosis is present. Pulmonic Valve: The pulmonic valve was grossly normal. Pulmonic valve regurgitation is not visualized. No evidence of pulmonic stenosis. Aorta: The aortic root and ascending aorta are structurally normal, with no evidence of dilitation. Venous: The inferior vena cava was not well visualized. IAS/Shunts: The atrial septum is grossly normal.   LEFT VENTRICLE PLAX 2D LVIDd:         3.80 cm     Diastology LVIDs:         2.30 cm     LV e' medial:    8.27 cm/s LV PW:         1.20 cm     LV E/e' medial:  9.4 LV IVS:        1.10 cm     LV e' lateral:   10.80 cm/s LVOT diam:     2.50 cm     LV E/e' lateral: 7.2 LV SV:         93 LV SV Index:   44 LVOT Area:     4.91 cm  LV Volumes (MOD) LV vol d, MOD A2C: 76.1 ml LV vol d, MOD A4C: 61.9 ml LV vol s, MOD A2C: 19.8 ml LV vol s, MOD A4C: 16.6 ml LV SV MOD A2C:     56.3 ml LV SV MOD A4C:     61.9 ml LV SV MOD BP:      49.3 ml RIGHT VENTRICLE             IVC RV S prime:     13.70 cm/s  IVC diam: 2.30 cm TAPSE (M-mode): 1.6 cm LEFT ATRIUM             Index        RIGHT ATRIUM           Index LA diam:        3.30 cm 1.55 cm/m   RA Area:     10.00 cm LA Vol (A2C):   44.0 ml 20.72 ml/m  RA Volume:   20.10 ml  9.46 ml/m LA Vol (A4C):   44.3 ml 20.86 ml/m LA Biplane Vol: 45.4 ml 21.38 ml/m  AORTIC VALVE LVOT Vmax:   123.00 cm/s LVOT Vmean:  77.100 cm/s LVOT VTI:    0.189 m  AORTA Ao Root diam: 3.50 cm Ao Asc diam:  3.50 cm MITRAL VALVE MV Area (PHT): 4.23 cm    SHUNTS MV Decel Time: 180 msec    Systemic VTI:  0.19 m MV E velocity: 77.45 cm/s  Systemic Diam: 2.50 cm MV A velocity: 92.15 cm/s MV E/A ratio:  0.84 Lennie Odor MD Electronically signed by Lennie Odor MD Signature Date/Time: 02/05/2023/4:01:02 PM    Final     Cardiac Studies   Echo 02/05/2023  1. Left ventricular ejection fraction, by estimation, is 60 to 65%. The  left ventricle has normal function. The left ventricle has no regional  wall motion abnormalities. There is mild concentric left ventricular  hypertrophy. Left ventricular diastolic  parameters were normal.   2. Right ventricular systolic function is normal. The right ventricular  size is normal. Tricuspid regurgitation signal is inadequate for assessing  PA pressure.   3. The mitral valve is grossly normal. No evidence of mitral valve  regurgitation. No evidence of mitral  stenosis.   4. The aortic valve is grossly normal. Aortic valve regurgitation is not  visualized. No aortic stenosis is present.   Patient Profile     62 y.o. female with PMH of HTN, minimal CAD 2009, endometriosis, GERD, hepatic steatosis, HLD, obesity, prediabetes, chronic chest pain, former tobacco use, ongoing THC use who presented with chest pain.   Assessment & Plan    Chest pain  - previous cath in 2009 showed mild nonobstructive CAD with luminal irregularities. Myoview in 2013 was normal.   - patient had chest pain after her right hip surgery. First EKG abnormal due to lead placement, second EKG was normal. Trop obtained due to concern for fat embolism which came back negative. Hgb low at 8.6, down from 12.   - patient has 2 area of chest pain, substernal chest pain worse with deep inspiration and chronic left sided chest pain under breast. Dr. Odis Hollingshead discussed with the patient various options, she opted for outpatient evaluation. Will arrange outpatient coronary CT. Single dose of 100mg  metoprolol has been sent to her pharmacy. She is ware to take it 2 hours prior to her coronary CT study. She is also aware to hold her home amlodipine and lisinopril in the morning of the coronary CT since she will be taking the high dose metoprolol. Our scheduler will contact her to arrange the coronary CT.   Postop anemia: hgb around 8. Seen by ortho, plan for discharge tomorrow.    HLD      For questions or updates, please contact  [facility] Please consult [email or web address] for contact info under      Signed, Azalee Course, PA  02/06/2023, 10:55   ADDENDUM:   Patient seen and examined with APP, Azalee Course.  I personally taken a history, examined the patient, reviewed relevant notes,  laboratory data / imaging studies.  I performed a substantive portion of this encounter and formulated the important aspects of the plan.  I agree with the APP's note, impression, and recommendations; however,  I have edited the note to reflect changes or salient points (which are noted in italics).   No Shortness of breath CP is present, reproducible, intensity frequency and duration remains stable  PHYSICAL EXAM: Today's Vitals   02/06/23 2014 02/06/23 2058 02/06/23 2139 02/06/23 2145  BP:   (!) 127/94 (!) 127/94  Pulse:   89 89  Resp:    16  Temp:    99 F (37.2 C)  TempSrc:      SpO2:      Weight:      Height:      PainSc: 9  4      Body mass index is 33.29 kg/m.   Net IO Since  Admission: 22,340.72 mL [02/07/23 0032]  Filed Weights   02/04/23 0554 02/04/23 1323  Weight: 99.3 kg 99.3 kg    Physical Exam  EKG: (personally reviewed by me) No new EKGs  Telemetry: (personally reviewed by me) Sinus   Impression:  Precordial pain. Status post right hip replacement. Hyperlipidemia Benign essential hypertension. Family history of heart disease. Marijuana consumption  Recommendations:  Precordial pain: Mixed features with cardiac and noncardiac components. Chronicity months to years. EKG thus far does not illustrate ischemia. High sensitive troponins negative x 2. BNP is within normal limits. CT PE protocol negative for pulmonary embolism. Given her comorbidities and family history of heart disease patient is quite concerned.  We discussed the role of inpatient and outpatient evaluation.  After discussing risk, benefits, alternatives, and limitations patient would like to proceed forward with outpatient coronary CTA. Coronary CTA order has been placed she should he hear back from radiology to schedule the appointment.  Benign essential hypertension: Continue current medications.  Hyperlipidemia: Continue rosuvastatin.  Marijuana use: reiterated the importance of complete cessation post discharge  Cardiology will sign off please call if any questions or concerns arise  Further recommendations to follow as the case evolves.   This note was created using a voice  recognition software as a result there may be grammatical errors inadvertently enclosed that do not reflect the nature of this encounter. Every attempt is made to correct such errors.   Tessa Lerner, DO, [facility]  [address] [place], [place] [address] Pager: [phone] Office: [phone] 02/07/2023 12:32 AM

## 2023-02-06 NOTE — Plan of Care (Signed)
  Problem: Coping: Goal: Level of anxiety will decrease Outcome: Progressing   Problem: Elimination: Goal: Will not experience complications related to urinary retention Outcome: Progressing   Problem: Pain Management: Goal: General experience of comfort will improve Outcome: Progressing   Problem: Pain Management: Goal: Pain level will decrease with appropriate interventions Outcome: Progressing

## 2023-02-06 NOTE — Progress Notes (Signed)
Physical Therapy Treatment Patient Details Name: Shannon Stuart MRN: [id number] DOB: [birth year] Today's Date: 02/06/2023   History of Present Illness 62 yo female presents to therapy s/p R THA, anterior approach on 02/04/2023 due to failure of conservative measures. Pt PMH includes but is not limited to: chronic pain, L HOH, L THA (2023), chronic pain, Kappa light chain dz, HLD, GAD, GERD, HTN, CAD, lumbar surgery and brain tumor excision.    PT Comments  Pt very eager to participate and progress to d/c home. Pt ambulated distance to tolerance as she continues to report pain and dizziness with mobilizing.  Pt returned to bed and performed LE exercises.  Pt anticipates receiving PRBCs today.       If plan is discharge home, recommend the following: A little help with bathing/dressing/bathroom;A little help with walking and/or transfers   Can travel by private vehicle        Equipment Recommendations  None recommended by PT    Recommendations for Other Services       Precautions / Restrictions Precautions Precautions: Fall Restrictions RLE Weight Bearing: Weight bearing as tolerated     Mobility  Bed Mobility Overal bed mobility: Needs Assistance Bed Mobility: Supine to Sit, Sit to Supine     Supine to sit: Contact guard Sit to supine: Mod assist   General bed mobility comments: assist for LEs onto bed    Transfers Overall transfer level: Needs assistance Equipment used: Rolling walker (2 wheels) Transfers: Sit to/from Stand Sit to Stand: Contact guard assist           General transfer comment: pt counts "1-2-3-" prior to standing but does well pushing to stand with BUE, effortful movement    Ambulation/Gait Ambulation/Gait assistance: Contact guard assist Gait Distance (Feet): 25 Feet Assistive device: Rolling walker (2 wheels) Gait Pattern/deviations: Step-to pattern, Decreased stance time - right, Antalgic Gait velocity: decreased     General Gait  Details: cues for sequence, RW positioning; distance to tolerance as pt continues to reports dizziness and chest pain   Stairs             Wheelchair Mobility     Tilt Bed    Modified Rankin (Stroke Patients Only)       Balance                                            Cognition Arousal: Alert Behavior During Therapy: WFL for tasks assessed/performed Overall Cognitive Status: Within Functional Limits for tasks assessed                                 General Comments: motivated to participate, eager to get better & d/c        Exercises Total Joint Exercises Ankle Circles/Pumps: AROM, Both, 10 reps Quad Sets: AROM, Both, 10 reps Heel Slides: Right, 10 reps, Supine, AAROM Hip ABduction/ADduction: AAROM, Right, 10 reps Long Arc Quad: AROM, Strengthening, Right, Seated, 10 reps    General Comments        Pertinent Vitals/Pain Pain Assessment Pain Assessment: 0-10 Pain Score: 8  Pain Location: R hip, continues to reports chest pain Pain Descriptors / Indicators: Discomfort, Sore Pain Intervention(s): Repositioned, Monitored during session    Home Living  Prior Function            PT Goals (current goals can now be found in the care plan section) Progress towards PT goals: Progressing toward goals    Frequency    7X/week      PT Plan      Co-evaluation              AM-PAC PT "6 Clicks" Mobility   Outcome Measure  Help needed turning from your back to your side while in a flat bed without using bedrails?: None Help needed moving from lying on your back to sitting on the side of a flat bed without using bedrails?: A Little Help needed moving to and from a bed to a chair (including a wheelchair)?: A Little Help needed standing up from a chair using your arms (e.g., wheelchair or bedside chair)?: A Little Help needed to walk in hospital room?: A Little Help needed  climbing 3-5 steps with a railing? : A Little 6 Click Score: 19    End of Session Equipment Utilized During Treatment: Gait belt Activity Tolerance: Patient tolerated treatment well Patient left: in bed;with call bell/phone within reach;with bed alarm set Nurse Communication: Mobility status PT Visit Diagnosis: Difficulty in walking, not elsewhere classified (R26.2);Pain Pain - Right/Left: Right Pain - part of body: Hip     Time: 1000-1020 PT Time Calculation (min) (ACUTE ONLY): 20 min  Charges:    $Gait Training: 8-22 mins PT General Charges $$ ACUTE PT VISIT: 1 Visit                     Paulino Door, DPT Physical Therapist Acute Rehabilitation Services Office: [phone]    [name] 02/06/2023, 12:55 PM

## 2023-02-06 NOTE — Discharge Instructions (Signed)
Our scheduler will contact you to arrange the outpatient coronary CT.   In the morning of the coronary CT< please hold lisinopril and amlodipine and take the single dose of 100mg  metoprolol 2 hours prior to the study. Please have someone to take you to [facility] and pick you up after your CT image.

## 2023-02-07 LAB — CBC
HCT: 30.2 % — ABNORMAL LOW (ref 36.0–46.0)
Hemoglobin: 9.7 g/dL — ABNORMAL LOW (ref 12.0–15.0)
MCH: 29.6 pg (ref 26.0–34.0)
MCHC: 32.1 g/dL (ref 30.0–36.0)
MCV: 92.1 fL (ref 80.0–100.0)
Platelets: 159 10*3/uL (ref 150–400)
RBC: 3.28 MIL/uL — ABNORMAL LOW (ref 3.87–5.11)
RDW: 16.1 % — ABNORMAL HIGH (ref 11.5–15.5)
WBC: 13.2 10*3/uL — ABNORMAL HIGH (ref 4.0–10.5)
nRBC: 0.2 % (ref 0.0–0.2)

## 2023-02-07 LAB — TYPE AND SCREEN
ABO/RH(D): B POS
Antibody Screen: NEGATIVE
Unit division: 0

## 2023-02-07 LAB — BPAM RBC
Blood Product Expiration Date: 202412032359
ISSUE DATE / TIME: 202411141158
Unit Type and Rh: 1700

## 2023-02-07 MED ORDER — OXYCODONE HCL 5 MG PO TABS: 5.0000 mg | ORAL_TABLET | Freq: Four times a day (QID) | ORAL | 0 refills | Status: DC | PRN

## 2023-02-07 MED ORDER — ASPIRIN 81 MG PO TBEC: 81.0000 mg | DELAYED_RELEASE_TABLET | Freq: Two times a day (BID) | ORAL | 0 refills | Status: DC

## 2023-02-07 NOTE — Discharge Summary (Signed)
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 increase the time between doses. The last dose that should be eliminated is the evening dose.     MAKE SURE YOU:  Understand these instructions.  Get help right away if you are not doing well or get worse.    Thank you for letting us be a part of your medical care team.  It is a privilege we respect greatly.  We hope these instructions will help you stay on track for a fast and full recovery!   Increase activity slowly as tolerated   Complete by: As directed    Post-operative opioid taper instructions:   Complete  by: As directed    POST-OPERATIVE OPIOID TAPER INSTRUCTIONS: It is important to wean off of your opioid medication as soon as possible. If you do not need pain medication after your surgery it is ok to stop day one. Opioids include: Codeine, Hydrocodone(Norco, Vicodin), Oxycodone(Percocet, oxycontin) and hydromorphone amongst others.  Long term and even short term use of opiods can  cause: Increased pain response Dependence Constipation Depression Respiratory depression And more.  Withdrawal symptoms can include Flu like symptoms Nausea, vomiting And more Techniques to manage these symptoms Hydrate well Eat regular healthy meals Stay active Use relaxation techniques(deep breathing, meditating, yoga) Do Not substitute Alcohol to help with tapering If you have been on opioids for less than two weeks and do not have pain than it is ok to stop all together.  Plan to wean off of opioids This plan should start within one week post op of your joint replacement. Maintain the same interval or time between taking each dose and first decrease the dose.  Cut the total daily intake of opioids by one tablet each day Next start to increase the time between doses. The last dose that should be eliminated is the evening dose.           Follow-up Information     Marcene Corning, MD. Schedule an appointment as soon as possible for a visit in 2 week(s).   Specialty: Orthopedic Surgery Contact information: [address] [place] [address] [phone]         [facility] Follow up.   Specialty: Home Health Services Why: to provide home physical therapy visits Contact information: [address] [place] [address] [phone]         Sharlene Dory, PA-C Follow up on 03/10/2023.   Specialty: Cardiology Why: 8:50 AM. Post hospital follow up. Our scheduler will contact you to arrange the coronary CT prior to this follow up. Contact information: [address] [place] [address] [phone]                  Signed: Ginger Organ Teletha Petrea 02/07/2023, 8:16 AM

## 2023-02-07 NOTE — Plan of Care (Signed)
  Problem: Education: Goal: Knowledge of General Education information will improve Description: Including pain rating scale, medication(s)/side effects and non-pharmacologic comfort measures Outcome: Progressing   Problem: Activity: Goal: Risk for activity intolerance will decrease Outcome: Progressing   Problem: Pain Management: Goal: General experience of comfort will improve Outcome: Progressing

## 2023-02-07 NOTE — Progress Notes (Signed)
Physical Therapy Treatment Patient Details Name: Shannon Stuart MRN: [id number] DOB: February 23, 1961 Today's Date: 02/07/2023   History of Present Illness 62 yo female presents to therapy s/p R THA, anterior approach on 02/04/2023 due to failure of conservative measures. Pt PMH includes but is not limited to: chronic pain, L HOH, L THA (2023), chronic pain, Kappa light chain dz, HLD, GAD, GERD, HTN, CAD, lumbar surgery and brain tumor excision.    PT Comments  Pt ambulated short distance, practiced one step and performed LE exercises.  Pt provided with HEP handout.  Pt would like one more session to practice step again prior to d/c home today.     If plan is discharge home, recommend the following: A little help with walking and/or transfers;A little help with bathing/dressing/bathroom   Can travel by private vehicle        Equipment Recommendations  None recommended by PT    Recommendations for Other Services       Precautions / Restrictions Precautions Precautions: Fall Restrictions RLE Weight Bearing: Weight bearing as tolerated     Mobility  Bed Mobility Overal bed mobility: Needs Assistance Bed Mobility: Supine to Sit, Sit to Supine     Supine to sit: Supervision Sit to supine: Supervision   General bed mobility comments: pt self assisted Rt LE with gait belt    Transfers Overall transfer level: Needs assistance Equipment used: Rolling walker (2 wheels) Transfers: Sit to/from Stand Sit to Stand: Contact guard assist           General transfer comment: cues for UE and LE positioning    Ambulation/Gait Ambulation/Gait assistance: Contact guard assist Gait Distance (Feet): 40 Feet Assistive device: Rolling walker (2 wheels) Gait Pattern/deviations: Step-to pattern, Decreased stance time - right, Antalgic Gait velocity: decreased     General Gait Details: pt able to recall sequence, RW positioning; distance to tolerance, pt reports mild dizziness but much  improved compared to yesterday; SpO2 94-97% on room air during ambulation   Stairs Stairs: Yes Stairs assistance: Contact guard assist Stair Management: Step to pattern, Forwards, With walker Number of Stairs: 1 General stair comments: verbal cues for sequence and safety; performed twice   Wheelchair Mobility     Tilt Bed    Modified Rankin (Stroke Patients Only)       Balance                                            Cognition Arousal: Alert Behavior During Therapy: WFL for tasks assessed/performed Overall Cognitive Status: Within Functional Limits for tasks assessed                                          Exercises Total Joint Exercises Ankle Circles/Pumps: AROM, Both, 10 reps Quad Sets: AROM, Both, 10 reps Short Arc Quad: AAROM, Right, 10 reps Heel Slides: Right, 10 reps, Supine, AAROM Hip ABduction/ADduction: AAROM, Right, 10 reps    General Comments        Pertinent Vitals/Pain Pain Assessment Pain Assessment: 0-10 Pain Score: 6  Pain Location: R hip Pain Descriptors / Indicators: Discomfort, Sore Pain Intervention(s): Monitored during session, Repositioned, Premedicated before session    Home Living  Prior Function            PT Goals (current goals can now be found in the care plan section) Progress towards PT goals: Progressing toward goals    Frequency    7X/week      PT Plan      Co-evaluation              AM-PAC PT "6 Clicks" Mobility   Outcome Measure  Help needed turning from your back to your side while in a flat bed without using bedrails?: None Help needed moving from lying on your back to sitting on the side of a flat bed without using bedrails?: A Little Help needed moving to and from a bed to a chair (including a wheelchair)?: A Little Help needed standing up from a chair using your arms (e.g., wheelchair or bedside chair)?: A Little Help  needed to walk in hospital room?: A Little Help needed climbing 3-5 steps with a railing? : A Little 6 Click Score: 19    End of Session Equipment Utilized During Treatment: Gait belt Activity Tolerance: Patient tolerated treatment well Patient left: in bed;with call bell/phone within reach;with bed alarm set Nurse Communication: Mobility status PT Visit Diagnosis: Difficulty in walking, not elsewhere classified (R26.2);Pain Pain - Right/Left: Right Pain - part of body: Hip     Time: 1002-1025 PT Time Calculation (min) (ACUTE ONLY): 23 min  Charges:    $Gait Training: 8-22 mins $Therapeutic Exercise: 8-22 mins PT General Charges $$ ACUTE PT VISIT: 1 Visit                     Paulino Door, DPT Physical Therapist Acute Rehabilitation Services Office: [phone]    [name] 02/07/2023, 4:57 PM

## 2023-02-07 NOTE — Progress Notes (Signed)
Physical Therapy Treatment Patient Details Name: Shannon Stuart MRN: [id number] DOB: 04-Apr-1960 Today's Date: 02/07/2023   History of Present Illness 62 yo female presents to therapy s/p R THA, anterior approach on 02/04/2023 due to failure of conservative measures. Pt PMH includes but is not limited to: chronic pain, L HOH, L THA (2023), chronic pain, Kappa light chain dz, HLD, GAD, GERD, HTN, CAD, lumbar surgery and brain tumor excision.    PT Comments  Pt ambulated into bathroom to practice toilet transfer and then into hallway to practice one step.  Pt reports understanding and feels ready for d/c home today.     If plan is discharge home, recommend the following: A little help with walking and/or transfers;A little help with bathing/dressing/bathroom   Can travel by private vehicle        Equipment Recommendations  None recommended by PT    Recommendations for Other Services       Precautions / Restrictions Precautions Precautions: Fall Restrictions RLE Weight Bearing: Weight bearing as tolerated     Mobility  Bed Mobility Overal bed mobility: Modified Independent Bed Mobility: Supine to Sit, Sit to Supine     Supine to sit: Supervision Sit to supine: Supervision   General bed mobility comments: pt self assisted Rt LE with gait belt    Transfers Overall transfer level: Needs assistance Equipment used: Rolling walker (2 wheels) Transfers: Sit to/from Stand Sit to Stand: Contact guard assist           General transfer comment: cues for UE and LE positioning; performed toilet transfer per pt request, reviewed safe technique and pt may need BSC to assist with height and UE support    Ambulation/Gait Ambulation/Gait assistance: Contact guard assist Gait Distance (Feet): 20 Feet Assistive device: Rolling walker (2 wheels) Gait Pattern/deviations: Step-to pattern, Decreased stance time - right, Antalgic Gait velocity: decreased     General Gait Details:  slow but steady with RW, able to utilize RW safely and correctly   Stairs Stairs: Yes Stairs assistance: Contact guard assist Stair Management: Step to pattern, Forwards, With walker Number of Stairs: 1 General stair comments: verbal cues for sequence and safety; performed twice   Wheelchair Mobility     Tilt Bed    Modified Rankin (Stroke Patients Only)       Balance                                            Cognition Arousal: Alert Behavior During Therapy: WFL for tasks assessed/performed Overall Cognitive Status: Within Functional Limits for tasks assessed                                          Exercises     General Comments        Pertinent Vitals/Pain Pain Assessment Pain Assessment: 0-10 Pain Score: 6  Pain Location: R hip Pain Descriptors / Indicators: Discomfort, Sore Pain Intervention(s): Monitored during session, Repositioned, Premedicated before session    Home Living                          Prior Function            PT Goals (current goals can now be found in the  care plan section) Progress towards PT goals: Progressing toward goals    Frequency    7X/week      PT Plan      Co-evaluation              AM-PAC PT "6 Clicks" Mobility   Outcome Measure  Help needed turning from your back to your side while in a flat bed without using bedrails?: A Little Help needed moving from lying on your back to sitting on the side of a flat bed without using bedrails?: A Little Help needed moving to and from a bed to a chair (including a wheelchair)?: A Little Help needed standing up from a chair using your arms (e.g., wheelchair or bedside chair)?: A Little Help needed to walk in hospital room?: A Little Help needed climbing 3-5 steps with a railing? : A Little 6 Click Score: 18    End of Session Equipment Utilized During Treatment: Gait belt Activity Tolerance: Patient tolerated  treatment well Patient left: in bed;with call bell/phone within reach;with bed alarm set Nurse Communication: Mobility status PT Visit Diagnosis: Difficulty in walking, not elsewhere classified (R26.2);Pain Pain - Right/Left: Right Pain - part of body: Hip     Time: 1610-9604 PT Time Calculation (min) (ACUTE ONLY): 14 min  Charges:    $Gait Training: 8-22 mins  PT General Charges $$ ACUTE PT VISIT: 1 Visit                     Paulino Door, DPT Physical Therapist Acute Rehabilitation Services Office: [phone]    Shannon Stuart 02/07/2023, 5:01 PM

## 2023-02-07 NOTE — Progress Notes (Signed)
Subjective: 3 Days Post-Op Procedure(s) (LRB): RIGHT TOTAL HIP ARTHROPLASTY ANTERIOR APPROACH (Right)  Patient is feeling better this mornign and is looking forward to going home today.  Activity level:  wbat Diet tolerance:  ok Voiding:  ok Patient reports pain as mild.    Objective: Vital signs in last 24 hours: Temp:  [97.6 F (36.4 C)-99.2 F (37.3 C)] 99.2 F (37.3 C) (11/15 0524) Pulse Rate:  [86-97] 92 (11/15 0524) Resp:  [16-18] 16 (11/15 0524) BP: (119-138)/(76-103) 121/76 (11/15 0524) SpO2:  [98 %-100 %] 98 % (11/15 0524)  Labs: Recent Labs    02/05/23 1034 02/06/23 0332 02/06/23 1650 02/07/23 0316  HGB 8.6* 8.0* 9.8* 9.7*   Recent Labs    02/06/23 0332 02/06/23 1650 02/07/23 0316  WBC 12.8*  --  13.2*  RBC 2.73*  --  3.28*  HCT 25.6* 30.7* 30.2*  PLT 147*  --  159   Recent Labs    02/05/23 1034 02/06/23 0332  NA 136 137  K 3.6 3.9  CL 101 103  CO2 25 26  BUN 21 24*  CREATININE 1.01* 0.89  GLUCOSE 212* 164*  CALCIUM 8.3* 8.3*   No results for input(s): "LABPT", "INR" in the last 72 hours.  Physical Exam:  Neurologically intact ABD soft Neurovascular intact Sensation intact distally Intact pulses distally Dorsiflexion/Plantar flexion intact Incision: dressing C/D/I and no drainage No cellulitis present Compartment soft  Assessment/Plan:  3 Days Post-Op Procedure(s) (LRB): RIGHT TOTAL HIP ARTHROPLASTY ANTERIOR APPROACH (Right) Advance diet Up with therapy Discharge home with home health today after being cleared by PT. I sent in additional oxycodone to take in addition to her baseline meds for post op pain control. Continue on 81mg  asa BID for dvt prevention. Follow up in office 2 weeks post op.     Shannon Stuart 02/07/2023, 8:11 AM

## 2023-02-10 ENCOUNTER — Other Ambulatory Visit: Payer: Self-pay

## 2023-02-10 DIAGNOSIS — G4700 Insomnia, unspecified: Secondary | ICD-10-CM

## 2023-02-10 MED ORDER — TRAZODONE HCL 100 MG PO TABS: 100.0000 mg | ORAL_TABLET | Freq: Every day | ORAL | 3 refills | Status: DC

## 2023-02-13 ENCOUNTER — Other Ambulatory Visit: Payer: Self-pay | Admitting: Family Medicine

## 2023-02-13 DIAGNOSIS — F418 Other specified anxiety disorders: Secondary | ICD-10-CM

## 2023-02-13 DIAGNOSIS — F32A Depression, unspecified: Secondary | ICD-10-CM

## 2023-02-25 ENCOUNTER — Ambulatory Visit: Payer: Medicare HMO | Attending: Family Medicine | Admitting: Physical Therapy

## 2023-02-25 DIAGNOSIS — R2689 Other abnormalities of gait and mobility: Secondary | ICD-10-CM | POA: Insufficient documentation

## 2023-02-25 DIAGNOSIS — M6281 Muscle weakness (generalized): Secondary | ICD-10-CM | POA: Insufficient documentation

## 2023-02-25 DIAGNOSIS — M25561 Pain in right knee: Secondary | ICD-10-CM | POA: Insufficient documentation
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
## 2023-02-28 ENCOUNTER — Telehealth (HOSPITAL_COMMUNITY): Payer: Self-pay | Admitting: *Deleted

## 2023-02-28 ENCOUNTER — Encounter (HOSPITAL_COMMUNITY): Payer: Self-pay

## 2023-02-28 NOTE — Telephone Encounter (Signed)
Attempted to call patient regarding upcoming cardiac CT appointment. °Left message on voicemail with name and callback number ° °Edie Vallandingham RN Navigator Cardiac Imaging °[facility] °[phone] Office °[phone] Cell ° °

## 2023-03-03 ENCOUNTER — Ambulatory Visit (HOSPITAL_COMMUNITY)
Admission: RE | Admit: 2023-03-03 | Discharge: 2023-03-03 | Disposition: A | Discharge status: Home / Self Care | Payer: Medicare HMO | Source: Ambulatory Visit | Attending: Physician Assistant | Admitting: Physician Assistant

## 2023-03-03 DIAGNOSIS — I2510 Atherosclerotic heart disease of native coronary artery without angina pectoris: Secondary | ICD-10-CM | POA: Insufficient documentation

## 2023-03-03 DIAGNOSIS — R079 Chest pain, unspecified: Secondary | ICD-10-CM | POA: Diagnosis not present

## 2023-03-03 DIAGNOSIS — R072 Precordial pain: Secondary | ICD-10-CM | POA: Diagnosis not present

## 2023-03-03 MED ORDER — NITROGLYCERIN 0.4 MG SL SUBL
0.8000 mg | SUBLINGUAL_TABLET | Freq: Once | SUBLINGUAL | Status: AC
  Administered 2023-03-03: 0.8 mg via SUBLINGUAL

## 2023-03-03 MED ORDER — IOHEXOL 350 MG/ML SOLN: 100.0000 mL | Freq: Once | INTRAVENOUS | Status: DC | PRN

## 2023-03-03 MED ORDER — NITROGLYCERIN 0.4 MG SL SUBL
SUBLINGUAL_TABLET | SUBLINGUAL | Status: AC
Start: 2023-03-03 — End: ?
  Filled 2023-03-03: qty 2

## 2023-03-03 MED ORDER — IOHEXOL 350 MG/ML SOLN
100.0000 mL | Freq: Once | INTRAVENOUS | Status: AC | PRN
  Administered 2023-03-03: 100 mL via INTRAVENOUS

## 2023-03-05 ENCOUNTER — Encounter: Payer: Self-pay | Admitting: Family Medicine

## 2023-03-05 ENCOUNTER — Ambulatory Visit: Payer: Medicare HMO | Admitting: Family Medicine

## 2023-03-05 VITALS — BP 115/65 | HR 85 | Ht 68.0 in | Wt 216.2 lb

## 2023-03-05 DIAGNOSIS — Z96643 Presence of artificial hip joint, bilateral: Secondary | ICD-10-CM | POA: Diagnosis not present

## 2023-03-05 DIAGNOSIS — R072 Precordial pain: Secondary | ICD-10-CM

## 2023-03-05 DIAGNOSIS — Z8249 Family history of ischemic heart disease and other diseases of the circulatory system: Secondary | ICD-10-CM

## 2023-03-05 DIAGNOSIS — Z23 Encounter for immunization: Secondary | ICD-10-CM

## 2023-03-05 NOTE — Patient Instructions (Signed)
Great to see you! Glad you are doing so well with your new hip. We gave you a pneumonia shot today. I would recommend a follow-up sometime in 4-6 months for chronic issues and certainly if you need something before then. Have a Happy Holiday!

## 2023-03-06 ENCOUNTER — Encounter: Payer: Self-pay | Admitting: Family Medicine

## 2023-03-06 NOTE — Assessment & Plan Note (Signed)
Discussed at some length her family history of heart disease and her recent symptoms.  I think this is weighing heavily on her mind.  I tried to reassure her the best of my ability.  So far her cardiac workup by cardiology has been negative.  Total  time 30 minutes in this discussion.

## 2023-03-06 NOTE — Assessment & Plan Note (Signed)
Doing well with her second hip replacement.  Continuing physical therapy.  Pain is well-managed

## 2023-03-06 NOTE — Progress Notes (Signed)
    CHIEF COMPLAINT / HPI: #1.  Follow-up recent total hip replacement.  She is doing pretty well.  Continues to work hard on physical therapy.  Is not using walker right now.  She is following all directions to keep her new hip joint safe. 2.  During her hospital stay they asked her if she had ever had an pneumonia vaccine and she wants to check.  She does not recall that she is ever had 1. #3.  Still having some intermittent left chest pain.  Usually sharp.  Can occur anytime and has occasionally awaken her from sleep.  She has seen cardiology but she is still quite worried as she reports a large family history of coronary disease.  Says she is starting to be concerned as she realizes she is getting older, she has now had her second hip replacement and she is having some chest issues.  She wants to make sure she stays healthy.   PERTINENT  PMH / PSH: I have reviewed the patient's medications, allergies, past medical and surgical history, smoking status and updated in the EMR as appropriate.   OBJECTIVE:  BP 115/65   Pulse 85   Ht 5\' 8"  (1.727 m)   Wt 216 lb 3.2 oz (98.1 kg)   LMP 09/20/2011   SpO2 96%   BMI 32.87 kg/m    ASSESSMENT / PLAN:   Precordial pain Continuing to have some intermittent left chest pain.  Usually sharp.  She has follow-up with cardiology.  She just had a coronary calcium scan done which showed less than 25% obstruction, calcium score of 0.  We discussed briefly.  Family history of heart disease Discussed at some length her family history of heart disease and her recent symptoms.  I think this is weighing heavily on her mind.  I tried to reassure her the best of my ability.  So far her cardiac workup by cardiology has been negative.  Total  time 30 minutes in this discussion.  Hx of bilateral hip replacements Doing well with her second hip replacement.  Continuing physical therapy.  Pain is well-managed   Denny Levy MD

## 2023-03-06 NOTE — Assessment & Plan Note (Signed)
Continuing to have some intermittent left chest pain.  Usually sharp.  She has follow-up with cardiology.  She just had a coronary calcium scan done which showed less than 25% obstruction, calcium score of 0.  We discussed briefly.
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
## 2023-03-10 ENCOUNTER — Ambulatory Visit: Payer: Medicare HMO | Attending: Physician Assistant | Admitting: Physician Assistant

## 2023-03-10 ENCOUNTER — Ambulatory Visit (INDEPENDENT_AMBULATORY_CARE_PROVIDER_SITE_OTHER): Payer: Medicare HMO

## 2023-03-10 ENCOUNTER — Encounter: Payer: Self-pay | Admitting: Physician Assistant

## 2023-03-10 VITALS — BP 118/72 | HR 101 | Ht 68.0 in | Wt 216.0 lb

## 2023-03-10 DIAGNOSIS — R079 Chest pain, unspecified: Secondary | ICD-10-CM

## 2023-03-10 DIAGNOSIS — R609 Edema, unspecified: Secondary | ICD-10-CM | POA: Diagnosis not present

## 2023-03-10 DIAGNOSIS — R002 Palpitations: Secondary | ICD-10-CM

## 2023-03-10 DIAGNOSIS — M7989 Other specified soft tissue disorders: Secondary | ICD-10-CM

## 2023-03-10 DIAGNOSIS — M79662 Pain in left lower leg: Secondary | ICD-10-CM | POA: Diagnosis not present

## 2023-03-10 MED ORDER — METOPROLOL TARTRATE 25 MG PO TABS: 12.5000 mg | ORAL_TABLET | ORAL | 0 refills | Status: DC | PRN

## 2023-03-10 MED ORDER — METOPROLOL TARTRATE 25 MG PO TABS: 12.5000 mg | ORAL_TABLET | ORAL | 3 refills | Status: DC | PRN

## 2023-03-10 NOTE — Addendum Note (Signed)
Addended by: Vernard Gambles on: 03/10/2023 11:56 AM   Modules accepted: Orders
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
## 2023-03-10 NOTE — Progress Notes (Unsigned)
ZIO XT serial # [id number] from office inventory applied to patient.  Dr. Odis Hollingshead to read.

## 2023-03-10 NOTE — Patient Instructions (Signed)
Medication Instructions:  Start Metoprolol (Lopressor) 12.5 mg as needed for palpitations   *If you need a refill on your cardiac medications before your next appointment, please call your pharmacy*   Lab Work: None ordered   If you have labs (blood work) drawn today and your tests are completely normal, you will receive your results only by: MyChart Message (if you have MyChart) OR A paper copy in the mail If you have any lab test that is abnormal or we need to change your treatment, we will call you to review the results.   Testing/Procedures: A zio monitor was ordered today. It will remain on for 14 days. You will then return monitor and event diary in provided box. It takes 1-2 weeks for report to be downloaded and returned to [place]. We will call you with the results. If monitor falls off or has orange flashing light, please call Z[employer] for further instructions.     Follow-Up: At [facility], you and your health needs are our priority.  As part of our continuing mission to provide you with exceptional heart care, we have created designated Provider Care Teams.  These Care Teams include your primary Cardiologist (physician) and Advanced Practice Providers (APPs -  Physician Assistants and Nurse Practitioners) who all work together to provide you with the care you need, when you need it.  We recommend signing up for the patient portal called "MyChart".  Sign up information is provided on this After Visit Summary.  MyChart is used to connect with patients for Virtual Visits (Telemedicine).  Patients are able to view lab/test results, encounter notes, upcoming appointments, etc.  Non-urgent messages can be sent to your provider as well.   To learn more about what you can do with MyChart, go to [email or web address].    Your next appointment:   6 week(s)  Provider:   Tessa Lerner, DO   or APP  Other Instructions ZIO XT- Long Term Monitor Instructions  Your physician has  requested you wear a ZIO patch monitor for 14 days.  This is a single patch monitor. [employer] supplies one patch monitor per enrollment. Additional stickers are not available. Please do not apply patch if you will be having a Nuclear Stress Test,  Echocardiogram, Cardiac CT, MRI, or Chest Xray during the period you would be wearing the  monitor. The patch cannot be worn during these tests. You cannot remove and re-apply the  ZIO XT patch monitor.  Your ZIO patch monitor will be mailed 3 day USPS to your address on file. It may take 3-5 days  to receive your monitor after you have been enrolled.  Once you have received your monitor, please review the enclosed instructions. Your monitor  has already been registered assigning a specific monitor serial # to you.  Billing and Patient Assistance Program Information  We have supplied [employer] with any of your insurance information on file for billing purposes. [employer] offers a sliding scale Patient Assistance Program for patients that do not have  insurance, or whose insurance does not completely cover the cost of the ZIO monitor.  You must apply for the Patient Assistance Program to qualify for this discounted rate.  To apply, please call [employer] at [phone], select option 4, select option 2, ask to apply for  Patient Assistance Program. Meredeth Ide will ask your household income, and how many people  are in your household. They will quote your out-of-pocket cost based on that information.  [employer] will also be able  to set up a 50-month, interest-free payment plan if needed.  Applying the monitor   Shave hair from upper left chest.  Hold abrader disc by orange tab. Rub abrader in 40 strokes over the upper left chest as  indicated in your monitor instructions.  Clean area with 4 enclosed alcohol pads. Let dry.  Apply patch as indicated in monitor instructions. Patch will be placed under collarbone on left  side of chest with arrow pointing  upward.  Rub patch adhesive wings for 2 minutes. Remove white label marked "1". Remove the white  label marked "2". Rub patch adhesive wings for 2 additional minutes.  While looking in a mirror, press and release button in center of patch. A small green light will  flash 3-4 times. This will be your only indicator that the monitor has been turned on.  Do not shower for the first 24 hours. You may shower after the first 24 hours.  Press the button if you feel a symptom. You will hear a small click. Record Date, Time and  Symptom in the Patient Logbook.  When you are ready to remove the patch, follow instructions on the last 2 pages of Patient  Logbook. Stick patch monitor onto the last page of Patient Logbook.  Place Patient Logbook in the blue and white box. Use locking tab on box and tape box closed  securely. The blue and white box has prepaid postage on it. Please place it in the mailbox as  soon as possible. Your physician should have your test results approximately 7 days after the  monitor has been mailed back to [facility].  Call [facility] Customer Care at [phone] if you have questions regarding  your ZIO XT patch monitor. Call them immediately if you see an orange light blinking on your  monitor.  If your monitor falls off in less than 4 days, contact our Monitor department at [phone].  If your monitor becomes loose or falls off after 4 days call [employer] at [phone] for  suggestions on securing your monitor

## 2023-03-11 ENCOUNTER — Other Ambulatory Visit: Payer: Self-pay

## 2023-03-11 ENCOUNTER — Ambulatory Visit: Payer: Medicare HMO

## 2023-03-11 DIAGNOSIS — M6281 Muscle weakness (generalized): Secondary | ICD-10-CM

## 2023-03-11 DIAGNOSIS — R2689 Other abnormalities of gait and mobility: Secondary | ICD-10-CM | POA: Diagnosis not present

## 2023-03-11 DIAGNOSIS — M25561 Pain in right knee: Secondary | ICD-10-CM

## 2023-03-13 ENCOUNTER — Ambulatory Visit: Payer: Medicare HMO

## 2023-03-13 DIAGNOSIS — M25561 Pain in right knee: Secondary | ICD-10-CM | POA: Diagnosis not present

## 2023-03-13 DIAGNOSIS — M6281 Muscle weakness (generalized): Secondary | ICD-10-CM | POA: Diagnosis not present

## 2023-03-13 DIAGNOSIS — R2689 Other abnormalities of gait and mobility: Secondary | ICD-10-CM | POA: Diagnosis not present
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
## 2023-03-17 ENCOUNTER — Encounter: Payer: Self-pay | Admitting: Physical Therapy

## 2023-03-17 ENCOUNTER — Ambulatory Visit: Payer: Medicare HMO | Admitting: Physical Therapy

## 2023-03-17 DIAGNOSIS — R2689 Other abnormalities of gait and mobility: Secondary | ICD-10-CM | POA: Diagnosis not present

## 2023-03-17 DIAGNOSIS — M6281 Muscle weakness (generalized): Secondary | ICD-10-CM | POA: Diagnosis not present

## 2023-03-17 DIAGNOSIS — M25561 Pain in right knee: Secondary | ICD-10-CM | POA: Diagnosis not present

## 2023-03-20 ENCOUNTER — Ambulatory Visit: Payer: Medicare HMO | Admitting: Physical Therapy

## 2023-03-25 ENCOUNTER — Encounter: Payer: Self-pay | Admitting: Physical Therapy

## 2023-03-25 ENCOUNTER — Ambulatory Visit: Payer: Medicare HMO | Admitting: Physical Therapy

## 2023-03-25 DIAGNOSIS — M6281 Muscle weakness (generalized): Secondary | ICD-10-CM | POA: Diagnosis not present

## 2023-03-25 DIAGNOSIS — R2689 Other abnormalities of gait and mobility: Secondary | ICD-10-CM | POA: Diagnosis not present

## 2023-03-25 DIAGNOSIS — M25561 Pain in right knee: Secondary | ICD-10-CM | POA: Diagnosis not present

## 2023-03-25 NOTE — Therapy (Signed)
  OUTPATIENT PHYSICAL THERAPY TREATMENT NOTE   Patient Name: Shannon Stuart MRN: [id number] DOB:November 12, 1960, 62 y.o., female Today's Date: 03/25/2023  END OF SESSION:  PT End of Session - 03/25/23 1314     Visit Number 4    Number of Visits 12
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
## 2023-04-01 ENCOUNTER — Ambulatory Visit: Payer: Medicare HMO
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
## 2023-04-03 ENCOUNTER — Ambulatory Visit: Payer: Medicare HMO

## 2023-04-08 ENCOUNTER — Telehealth: Payer: Self-pay | Admitting: Cardiology

## 2023-04-08 MED ORDER — METOPROLOL TARTRATE 25 MG PO TABS: 25.0000 mg | ORAL_TABLET | Freq: Two times a day (BID) | ORAL | 3 refills | Status: DC

## 2023-04-08 NOTE — Telephone Encounter (Signed)
 Pt advised her Zio results and verbalized understanding.

## 2023-04-08 NOTE — Telephone Encounter (Signed)
Patient is returning call to discuss monitor results.

## 2023-04-21 ENCOUNTER — Ambulatory Visit: Payer: Self-pay | Admitting: Cardiology

## 2023-04-24 ENCOUNTER — Other Ambulatory Visit: Payer: Self-pay | Admitting: Physician Assistant

## 2023-04-26 ENCOUNTER — Emergency Department (HOSPITAL_BASED_OUTPATIENT_CLINIC_OR_DEPARTMENT_OTHER): Payer: Medicare HMO

## 2023-04-26 ENCOUNTER — Encounter (HOSPITAL_BASED_OUTPATIENT_CLINIC_OR_DEPARTMENT_OTHER): Payer: Self-pay | Admitting: Internal Medicine

## 2023-04-26 ENCOUNTER — Inpatient Hospital Stay (HOSPITAL_BASED_OUTPATIENT_CLINIC_OR_DEPARTMENT_OTHER)
Admission: EM | Admit: 2023-04-26 | Discharge: 2023-04-29 | DRG: 684 | Disposition: A | Discharge status: Home / Self Care | Payer: Medicare HMO | Attending: Internal Medicine | Admitting: Internal Medicine

## 2023-04-26 ENCOUNTER — Other Ambulatory Visit: Payer: Self-pay

## 2023-04-26 DIAGNOSIS — Z885 Allergy status to narcotic agent status: Secondary | ICD-10-CM

## 2023-04-26 DIAGNOSIS — K529 Noninfective gastroenteritis and colitis, unspecified: Secondary | ICD-10-CM | POA: Diagnosis not present

## 2023-04-26 DIAGNOSIS — R112 Nausea with vomiting, unspecified: Principal | ICD-10-CM | POA: Diagnosis present

## 2023-04-26 DIAGNOSIS — Z87891 Personal history of nicotine dependence: Secondary | ICD-10-CM

## 2023-04-26 DIAGNOSIS — Z8249 Family history of ischemic heart disease and other diseases of the circulatory system: Secondary | ICD-10-CM

## 2023-04-26 DIAGNOSIS — Z7982 Long term (current) use of aspirin: Secondary | ICD-10-CM

## 2023-04-26 DIAGNOSIS — Z888 Allergy status to other drugs, medicaments and biological substances status: Secondary | ICD-10-CM

## 2023-04-26 DIAGNOSIS — N179 Acute kidney failure, unspecified: Secondary | ICD-10-CM | POA: Diagnosis not present

## 2023-04-26 DIAGNOSIS — Z1152 Encounter for screening for COVID-19: Secondary | ICD-10-CM

## 2023-04-26 DIAGNOSIS — E785 Hyperlipidemia, unspecified: Secondary | ICD-10-CM | POA: Diagnosis present

## 2023-04-26 DIAGNOSIS — Z9049 Acquired absence of other specified parts of digestive tract: Secondary | ICD-10-CM

## 2023-04-26 DIAGNOSIS — F419 Anxiety disorder, unspecified: Secondary | ICD-10-CM | POA: Diagnosis present

## 2023-04-26 DIAGNOSIS — K760 Fatty (change of) liver, not elsewhere classified: Secondary | ICD-10-CM | POA: Diagnosis present

## 2023-04-26 DIAGNOSIS — I10 Essential (primary) hypertension: Secondary | ICD-10-CM | POA: Diagnosis present

## 2023-04-26 DIAGNOSIS — Z833 Family history of diabetes mellitus: Secondary | ICD-10-CM

## 2023-04-26 DIAGNOSIS — R079 Chest pain, unspecified: Secondary | ICD-10-CM

## 2023-04-26 DIAGNOSIS — E860 Dehydration: Secondary | ICD-10-CM | POA: Diagnosis present

## 2023-04-26 DIAGNOSIS — E66812 Obesity, class 2: Secondary | ICD-10-CM | POA: Diagnosis present

## 2023-04-26 DIAGNOSIS — Z860101 Personal history of adenomatous and serrated colon polyps: Secondary | ICD-10-CM

## 2023-04-26 DIAGNOSIS — R0789 Other chest pain: Secondary | ICD-10-CM | POA: Diagnosis present

## 2023-04-26 DIAGNOSIS — Z96643 Presence of artificial hip joint, bilateral: Secondary | ICD-10-CM | POA: Diagnosis present

## 2023-04-26 DIAGNOSIS — Z79899 Other long term (current) drug therapy: Secondary | ICD-10-CM

## 2023-04-26 DIAGNOSIS — K219 Gastro-esophageal reflux disease without esophagitis: Secondary | ICD-10-CM | POA: Diagnosis present

## 2023-04-26 DIAGNOSIS — Z8701 Personal history of pneumonia (recurrent): Secondary | ICD-10-CM

## 2023-04-26 DIAGNOSIS — E876 Hypokalemia: Secondary | ICD-10-CM | POA: Diagnosis not present

## 2023-04-26 DIAGNOSIS — Z6830 Body mass index (BMI) 30.0-30.9, adult: Secondary | ICD-10-CM

## 2023-04-26 DIAGNOSIS — I2510 Atherosclerotic heart disease of native coronary artery without angina pectoris: Secondary | ICD-10-CM | POA: Diagnosis present

## 2023-04-26 LAB — BASIC METABOLIC PANEL
Anion gap: 13 (ref 5–15)
BUN: 13 mg/dL (ref 8–23)
CO2: 22 mmol/L (ref 22–32)
Calcium: 9.4 mg/dL (ref 8.9–10.3)
Chloride: 100 mmol/L (ref 98–111)
Creatinine, Ser: 1.11 mg/dL — ABNORMAL HIGH (ref 0.44–1.00)
GFR, Estimated: 56 mL/min — ABNORMAL LOW (ref >60)
Glucose, Bld: 178 mg/dL — ABNORMAL HIGH (ref 70–99)
Potassium: 3.4 mmol/L — ABNORMAL LOW (ref 3.5–5.1)
Sodium: 135 mmol/L (ref 135–145)

## 2023-04-26 LAB — HEPATIC FUNCTION PANEL
ALT: 27 U/L (ref 0–44)
AST: 34 U/L (ref 15–41)
Albumin: 4.8 g/dL (ref 3.5–5.0)
Alkaline Phosphatase: 115 U/L (ref 38–126)
Bilirubin, Direct: 0.2 mg/dL (ref 0.0–0.2)
Indirect Bilirubin: 0.4 mg/dL (ref 0.3–0.9)
Total Bilirubin: 0.6 mg/dL (ref 0.0–1.2)
Total Protein: 8.7 g/dL — ABNORMAL HIGH (ref 6.5–8.1)

## 2023-04-26 LAB — CBC
HCT: 42.3 % (ref 36.0–46.0)
Hemoglobin: 13.9 g/dL (ref 12.0–15.0)
MCH: 27.4 pg (ref 26.0–34.0)
MCHC: 32.9 g/dL (ref 30.0–36.0)
MCV: 83.3 fL (ref 80.0–100.0)
Platelets: 286 10*3/uL (ref 150–400)
RBC: 5.08 MIL/uL (ref 3.87–5.11)
RDW: 17.1 % — ABNORMAL HIGH (ref 11.5–15.5)
WBC: 14.7 10*3/uL — ABNORMAL HIGH (ref 4.0–10.5)
nRBC: 0 % (ref 0.0–0.2)

## 2023-04-26 LAB — RESP PANEL BY RT-PCR (RSV, FLU A&B, COVID)  RVPGX2
Influenza A by PCR: NEGATIVE
Influenza B by PCR: NEGATIVE
Resp Syncytial Virus by PCR: NEGATIVE
SARS Coronavirus 2 by RT PCR: NEGATIVE

## 2023-04-26 LAB — TROPONIN I (HIGH SENSITIVITY): Troponin I (High Sensitivity): 12 ng/L (ref <18)

## 2023-04-26 LAB — SARS CORONAVIRUS 2 BY RT PCR: SARS Coronavirus 2 by RT PCR: NEGATIVE

## 2023-04-26 MED ORDER — PROMETHAZINE HCL 25 MG RE SUPP: 25.0000 mg | Freq: Four times a day (QID) | RECTAL | Status: DC | PRN

## 2023-04-26 MED ORDER — FENTANYL CITRATE PF 50 MCG/ML IJ SOSY
25.0000 ug | PREFILLED_SYRINGE | Freq: Once | INTRAMUSCULAR | Status: AC
  Administered 2023-04-26: 25 ug via INTRAVENOUS
  Filled 2023-04-26: qty 1

## 2023-04-26 MED ORDER — PROMETHAZINE HCL 25 MG/ML IJ SOLN
INTRAMUSCULAR | Status: AC
  Administered 2023-04-26: 25 mg
  Filled 2023-04-26: qty 1

## 2023-04-26 MED ORDER — PROMETHAZINE HCL 25 MG/ML IJ SOLN
25.0000 mg | Freq: Once | INTRAMUSCULAR | Status: AC
  Administered 2023-04-26: 25 mg via INTRAMUSCULAR

## 2023-04-26 MED ORDER — METOPROLOL TARTRATE 25 MG PO TABS
12.5000 mg | ORAL_TABLET | Freq: Once | ORAL | Status: AC
  Administered 2023-04-26: 12.5 mg via ORAL
  Filled 2023-04-26: qty 1

## 2023-04-26 MED ORDER — IOHEXOL 300 MG/ML  SOLN: 75.0000 mL | Freq: Once | INTRAMUSCULAR | Status: DC | PRN

## 2023-04-26 MED ORDER — KETOROLAC TROMETHAMINE 15 MG/ML IJ SOLN
15.0000 mg | Freq: Once | INTRAMUSCULAR | Status: AC
Start: 2023-04-26 — End: 2023-04-26
  Administered 2023-04-26: 15 mg via INTRAVENOUS
  Filled 2023-04-26: qty 1

## 2023-04-26 MED ORDER — DIPHENHYDRAMINE HCL 50 MG/ML IJ SOLN
25.0000 mg | Freq: Once | INTRAMUSCULAR | Status: AC
  Administered 2023-04-26: 25 mg via INTRAVENOUS
  Filled 2023-04-26: qty 1

## 2023-04-26 MED ORDER — DROPERIDOL 2.5 MG/ML IJ SOLN
1.2500 mg | Freq: Once | INTRAMUSCULAR | Status: AC
  Administered 2023-04-26: 1.25 mg via INTRAVENOUS
  Filled 2023-04-26: qty 2

## 2023-04-26 MED ORDER — FENTANYL CITRATE PF 50 MCG/ML IJ SOSY
25.0000 ug | PREFILLED_SYRINGE | Freq: Once | INTRAMUSCULAR | Status: AC
Start: 2023-04-26 — End: 2023-04-26
  Administered 2023-04-26: 25 ug via INTRAVENOUS
  Filled 2023-04-26: qty 1

## 2023-04-26 MED ORDER — SODIUM CHLORIDE 0.9 % IV SOLN
12.5000 mg | Freq: Once | INTRAVENOUS | Status: DC
  Filled 2023-04-26: qty 0.5

## 2023-04-26 MED ORDER — METOCLOPRAMIDE HCL 5 MG/ML IJ SOLN
10.0000 mg | Freq: Once | INTRAMUSCULAR | Status: AC
Start: 2023-04-26 — End: 2023-04-26
  Administered 2023-04-26: 10 mg via INTRAVENOUS
  Filled 2023-04-26: qty 2

## 2023-04-26 MED ORDER — SODIUM CHLORIDE 0.9 % IV BOLUS
1000.0000 mL | Freq: Once | INTRAVENOUS | Status: AC
  Administered 2023-04-26: 1000 mL via INTRAVENOUS

## 2023-04-26 NOTE — ED Provider Notes (Cosign Needed)
Received patient at signout from previous provider for admission for intractable nausea and vomiting.  See her note.  In short, patient presents for intractable nausea and vomiting endorsing she is vomited "20 times over the past day" and is unable to keep anything down.  She also had 8 episodes of nonbloody watery diarrhea.  Also she has intermittent chest pain for the past week but has been out of her metoprolol.  ED workup is significant for leukocytosis at 14.7.  Mildly elevated creatinine of 1.11.  Troponin negative.  CT imaging was negative for any acute abnormality  Currently she has no complaints of chest pain.  Her blood pressure is 156/109.  We are controlling her nausea, vomiting with droperidol as Phenergan x2 has not helped.  We have provided a dose of her metoprolol for hypertension decreasing it from max of systolic blood pressure of 172.  Reviewed home meds, ordered pharmacy to review meds, and ordered home HTN medication as she is hypertensive here and waiting for [facility] tele bed  Dr. Janalyn Shy accepts patient for admission   Shannon Sheen, PA 04/26/23 1944    Shannon Sheen, PA 04/27/23 1610    Vanetta Mulders, MD 04/27/23 2351

## 2023-04-26 NOTE — ED Notes (Signed)
Provided gingerale for po challenge

## 2023-04-26 NOTE — ED Triage Notes (Signed)
Pt presents with complaints chest pain and palpitations, also reports emesis and diarrhea that started yesterday.   States she was on Metoprolol but has been out X 1 week

## 2023-04-26 NOTE — ED Notes (Signed)
Pt reports vomiting after taking 2 sips of liquid. Bile looking vomit noted in emesis bag

## 2023-04-26 NOTE — ED Notes (Signed)
Unsuccessful IV attempt x2, patient tolerated well.  RN & EDP aware of difficult IV start.

## 2023-04-26 NOTE — Plan of Care (Signed)
Plan of Care Note for accepted transfer  Patient: Shannon Stuart              JWJ:[id number]  DOA: 04/26/2023     Facility requesting transfer: [facility] emergency department Requesting Provider: Pete Pelt, [place]    Reason for transfer: Intractable nausea and vomiting secondary to viral gastroenteritis and AKI  Facility course:  Shannon Stuart is a 63 y.o. female, history of tachycardia, hypertension, who presents to the ED secondary to 2 different complaints.  She states that for the last day, she has had intractable nausea/vomiting, as well as diarrhea.  She has vomited about 20 times, and cannot keep anything down, also states that she is having 8 episodes of diarrhea that is watery, nonbloody.  Denies any upper respiratory symptoms.  States that she has not eaten out recently, in the last couple days, has not eaten anything questionable.  Notes that she has these bouts from time to time.  Denies any kind of abdominal pain other than abdominal pain after vomiting.  She states this occurred after vomiting multiple times, and did not occur prior to this.   Additionally states that she is having intermittent chest pain, she states this chest pain, is intermittent, and she has been out of her metoprolol tartrate, for the last 7 days due to insurance issues.  She states it feels similar to the past chest pain, and she has a little bit of shortness of breath with this, but notes that this occurs every time she has the chest pain.    At presentation to ED patient initially found tachycardic heart rate 128 which improved to 95.  Initially blood pressure was 125/98 which trended up to 171/111 afterward improved to upper 150s range.  Otherwise respiratory 16 and O2 sat 97% room air.  Troponin 12 within normal range.  EKG showing sinus tachycardia heart rate 119.  There is no ST and T wave abnormality.  BMP showed low potassium 3.4, elevated creatinine 1.1 and low GFR 56.  Hepatic function  panel unremarkable. CBC showing leukocytosis 40.7 otherwise unremarkable. Respiratory panel negative for COVID, flu RSV.  CT abdomen pelvis no acute finding.  Aortic atherosclerosis. Chest x-ray no acute cardiopulmonary disease.  In the ED patient has been treated with Benadryl, fentanyl for abdominal pain, Toradol Reglan.  For persistent tachycardia patient received metoprolol 12.5 mg.  Also received 1 L of NS bolus.  Hospitalist has been contacted for further evaluation management of intractable nausea vomiting and diarrhea it is a viral gastroenteritis and AKI.   Plan of care: The patient is accepted for admission for observation status to Telemetry unit, at [facility].  Check [email or web address] for on-call coverage.  [facility] will assume care on arrival to accepting facility. Until arrival, medical decision making responsibilities remain with the EDP.  However, [facility] available 24/7 for questions and assistance.   Nursing staff please page [facility] Admits and Consults [phone]) as soon as the patient arrives to the hospital.    Author: Tereasa Coop, MD  04/26/2023  [facility]

## 2023-04-26 NOTE — ED Notes (Signed)
Pt very difficult IV stick. Pt stuck multiple times. Unable to thread catheters or veins blew. IV [place] unsuccessful. Pt tolerated well. Able to obtain 24 g angio to left shoulder. IVF's restarted and IV phenergan infusing slowly without s/o infilatration. EDP aware of delay of fluids and meds

## 2023-04-26 NOTE — ED Provider Notes (Signed)
[place] EMERGENCY DEPARTMENT AT [facility] Provider Note   CSN: [id number] Arrival date & time: 04/26/23  0941     History  Chief Complaint  Patient presents with   Emesis   Chest Pain    Shannon Stuart is a 63 y.o. female, history of tachycardia, hypertension, who presents to the ED secondary to 2 different complaints.  She states that for the last day, she has had intractable nausea/vomiting, as well as diarrhea.  She has vomited about 20 times, and cannot keep anything down, also states that she is having 8 episodes of diarrhea that is watery, nonbloody.  Denies any upper respiratory symptoms.  States that she has not eaten out recently, in the last couple days, has not eaten anything questionable.  Notes that she has these bouts from time to time.  Denies any kind of abdominal pain other than abdominal pain after vomiting.  She states this occurred after vomiting multiple times, and did not occur prior to this.  Additionally states that she is having intermittent chest pain, she states this chest pain, is intermittent, and she has been out of her metoprolol tartrate, for the last 7 days due to insurance issues.  She states it feels similar to the past chest pain, and she has a little bit of shortness of breath with this, but notes that this occurs every time she has the chest pain.  Home Medications Prior to Admission medications   Medication Sig Start Date End Date Taking? Authorizing Provider  ALPRAZolam Prudy Feeler) 1 MG tablet TAKE 1 TABLET BY MOUTH 3 TIMES A DAY 02/17/23   Nestor Ramp, MD  amLODipine (NORVASC) 5 MG tablet Take 1 tablet (5 mg total) by mouth at bedtime. 11/20/22   Nestor Ramp, MD  aspirin EC 81 MG tablet Take 1 tablet (81 mg total) by mouth 2 (two) times daily after a meal. For 2 weeks then back to baseline once a day for DVT prevention. 02/07/23   Elodia Florence, PA-C  Cholecalciferol (VITAMIN D) 125 MCG (5000 UT) CAPS Take 5,000 Units by mouth daily.     [provider]  cyclobenzaprine (FLEXERIL) 10 MG tablet Take 1 tablet (10 mg total) by mouth 3 (three) times daily. 11/20/22   Nestor Ramp, MD  diclofenac Sodium (VOLTAREN) 1 % GEL APPLY 2 GRAMS TOPICALLY FOUR TIMES DAILY Patient taking differently: 2 g daily as needed (pain). 09/08/20   Moses Manners, MD  furosemide (LASIX) 20 MG tablet TAKE 1 TABLET EVERY DAY 10/30/22   Nestor Ramp, MD  lidocaine (LIDODERM) 5 % Use one or two patches for up to 12 hours once a day as directed and discard patch after 12 hours Patient taking differently: Place 1-2 patches onto the skin daily as needed (pain). 11/07/22   Nestor Ramp, MD  lisinopril (ZESTRIL) 40 MG tablet Take 1 tablet (40 mg total) by mouth daily. 11/20/22   Nestor Ramp, MD  MAGNESIUM PO Take 1 tablet by mouth daily.    [provider]  metoprolol tartrate (LOPRESSOR) 25 MG tablet TAKE 1/2 TABLET BY MOUTH DAILY AS NEEDED FOR PALPITATIONS 04/24/23   Tolia, Sunit, DO  Multiple Vitamins-Minerals (MULTIVITAMIN WITH MINERALS) tablet Take 1 tablet by mouth daily.    [provider]  Omega-3 Fatty Acids (FISH OIL) 1000 MG CAPS Take 1,000 mg by mouth daily.     [provider]  OVER THE COUNTER MEDICATION Take 1 capsule by mouth daily. Sea  Moss, Ashwagandha, [name], Black [employer], Ginger    [provider]  oxyCODONE-acetaminophen (PERCOCET) 10-325 MG tablet Take 1 tablet by mouth 3 (three) times daily. 04/27/21   [provider]  pantoprazole (PROTONIX) 40 MG tablet TAKE 1 TABLET DAILY. PATIENT NEEDS FOLLOW UP APPOINTMENT FOR FUTURE REFILLS. PLEASE CALL [phone] TO SCHEDULE. 10/08/22   Nestor Ramp, MD  Potassium 99 MG TABS Take 99 mg by mouth daily.    [provider]  promethazine (PHENERGAN) 25 MG suppository Place 1 suppository (25 mg total) rectally every 6 (six) hours as needed for nausea or vomiting. 05/18/22   Nestor Ramp, MD  rosuvastatin (CRESTOR) 40 MG tablet Take 1 tablet (40 mg  total) by mouth daily. 09/16/22   Nestor Ramp, MD  traZODone (DESYREL) 100 MG tablet Take 1 tablet (100 mg total) by mouth at bedtime. 02/10/23   Nestor Ramp, MD      Allergies    Morphine and Zofran Frazier Richards hcl]    Review of Systems   Review of Systems  Respiratory:  Positive for shortness of breath. Negative for cough.   Cardiovascular:  Positive for chest pain.  Gastrointestinal:  Positive for vomiting.    Physical Exam Updated Vital Signs BP (!) 156/109   Pulse 95   Temp 98.6 F (37 C)   Resp 16   LMP 09/20/2011   SpO2 97%  Physical Exam Vitals and nursing note reviewed.  Constitutional:      Appearance: Normal appearance.  HENT:     Head: Normocephalic and atraumatic.     Right Ear: Tympanic membrane normal.     Left Ear: Tympanic membrane normal.     Nose: Nose normal.     Mouth/Throat:     Mouth: Mucous membranes are moist.  Eyes:     Extraocular Movements: Extraocular movements intact.     Conjunctiva/sclera: Conjunctivae normal.     Pupils: Pupils are equal, round, and reactive to light.  Cardiovascular:     Rate and Rhythm: Regular rhythm. Tachycardia present.  Pulmonary:     Effort: Pulmonary effort is normal.     Breath sounds: Normal breath sounds.  Abdominal:     General: Abdomen is flat. Bowel sounds are normal.     Palpations: Abdomen is soft.     Tenderness: There is generalized abdominal tenderness. There is no guarding or rebound.  Musculoskeletal:        General: Normal range of motion.     Cervical back: Normal range of motion and neck supple.  Skin:    General: Skin is warm and dry.     Capillary Refill: Capillary refill takes less than 2 seconds.  Neurological:     General: No focal deficit present.     Mental Status: She is alert.  Psychiatric:        Mood and Affect: Mood normal.        Thought Content: Thought content normal.     ED Results / Procedures / Treatments   Labs (all labs ordered are listed, but only abnormal  results are displayed) Labs Reviewed  BASIC METABOLIC PANEL - Abnormal; Notable for the following components:      Result Value   Potassium 3.4 (*)    Glucose, Bld 178 (*)    Creatinine, Ser 1.11 (*)    GFR, Estimated 56 (*)    All other components within normal limits  CBC - Abnormal; Notable for the following components:   WBC 14.7 (*)  RDW 17.1 (*)    All other components within normal limits  HEPATIC FUNCTION PANEL - Abnormal; Notable for the following components:   Total Protein 8.7 (*)    All other components within normal limits  SARS CORONAVIRUS 2 BY RT PCR  RESP PANEL BY RT-PCR (RSV, FLU A&B, COVID)  [id number]  TROPONIN I (HIGH SENSITIVITY)    EKG EKG Interpretation Date/Time:  Saturday April 26 2023 10:11:11 EST Ventricular Rate:  119 PR Interval:  153 QRS Duration:  87 QT Interval:  327 QTC Calculation: 461 R Axis:   64  Text Interpretation: Sinus tachycardia Right atrial enlargement Low voltage, precordial leads Confirmed by Beckey Downing [phone]) on 04/26/2023 10:13:41 AM  Radiology CT ABDOMEN PELVIS WO CONTRAST Result Date: 04/26/2023 CLINICAL DATA:  Nausea and vomiting, suspected bowel obstruction, chest pain EXAM: CT ABDOMEN AND PELVIS WITHOUT CONTRAST TECHNIQUE: Multidetector CT imaging of the abdomen and pelvis was performed following the standard protocol without IV contrast. RADIATION DOSE REDUCTION: This exam was performed according to the departmental dose-optimization program which includes automated exposure control, adjustment of the mA and/or kV according to patient size and/or use of iterative reconstruction technique. COMPARISON:  CTA chest 02/05/2023, and previous FINDINGS: Lower chest: No pleural effusion. Trace pericardial fluid. Visualized lung bases clear. Hepatobiliary: No focal liver abnormality is seen. Status post cholecystectomy. No biliary dilatation. Pancreas: Unremarkable. No pancreatic ductal dilatation or surrounding inflammatory changes.  Spleen: Normal in size without focal abnormality. Adrenals/Urinary Tract: Adrenal glands are unremarkable. Kidneys are normal, without renal calculi, focal lesion, or hydronephrosis. Bladder is unremarkable. Stomach/Bowel: Stomach and Shemekia Patane bowel incompletely distended, unremarkable. Normal appendix. Colon is partially distended by gas and fluid proximally, relatively decompressed distally, without acute finding. Vascular/Lymphatic: Mild scattered aortoiliac calcified atheromatous plaque. No abdominal or pelvic adenopathy. Reproductive: Uterus and bilateral adnexa are unremarkable. Other: Pelvic phleboliths.  No ascites.  No free air. Musculoskeletal: Mild coarse sclerosis throughout the visualized thoracolumbar spine and sacrum with scattered Benelli Winther lucencies, similar to that seen 08/15/2020. Post bilateral hip arthroplasty. Old avulsion deformity from the right ischial tuberosity. IMPRESSION: 1. No acute findings. 2.  Aortic Atherosclerosis (ICD10-I70.0). Electronically Signed   By: Corlis Leak M.D.   On: 04/26/2023 18:02   DG Chest 2 View Result Date: 04/26/2023 CLINICAL DATA:  Chest pain EXAM: CHEST - 2 VIEW COMPARISON:  10/30/2021 FINDINGS: The heart size and mediastinal contours are within normal limits. Both lungs are clear. The visualized skeletal structures are unremarkable. IMPRESSION: No active cardiopulmonary disease. Electronically Signed   By: Duanne Guess D.O.   On: 04/26/2023 10:56    Procedures Procedures    Medications Ordered in ED Medications  promethazine (PHENERGAN) 12.5 mg in sodium chloride 0.9 % 50 mL IVPB (12.5 mg Intravenous Not Given 04/26/23 1627)  iohexol (OMNIPAQUE) 300 MG/ML solution 75 mL ( Intravenous Canceled Entry 04/26/23 1741)  sodium chloride 0.9 % bolus 1,000 mL (0 mLs Intravenous Stopped 04/26/23 1055)  ketorolac (TORADOL) 15 MG/ML injection 15 mg (15 mg Intravenous Given 04/26/23 1053)  metoprolol tartrate (LOPRESSOR) tablet 12.5 mg (12.5 mg Oral Given 04/26/23 1054)   promethazine (PHENERGAN) 25 MG/ML injection (25 mg  Given 04/26/23 1055)  promethazine (PHENERGAN) injection 25 mg (25 mg Intramuscular Given 04/26/23 1245)  metoCLOPramide (REGLAN) injection 10 mg (10 mg Intravenous Given 04/26/23 1346)  diphenhydrAMINE (BENADRYL) injection 25 mg (25 mg Intravenous Given 04/26/23 1344)  fentaNYL (SUBLIMAZE) injection 25 mcg (25 mcg Intravenous Given 04/26/23 1344)  fentaNYL (SUBLIMAZE) injection 25 mcg (25 mcg Intravenous  Given 04/26/23 1616)  promethazine (PHENERGAN) 25 MG/ML injection (25 mg  Given 04/26/23 1625)  droperidol (INAPSINE) 2.5 MG/ML injection 1.25 mg (1.25 mg Intravenous Given 04/26/23 1851)    ED Course/ Medical Decision Making/ A&P                                 Medical Decision Making Patient here for multitude of problems including intractable nausea, vomiting, diarrhea, she has tried Phenergan suppositories at home but cannot keep anything down.  States she is also having diffuse abdominal pain.  Is declining CT abdomen pelvis at this time.  Also complaining of chest pain, and shortness of breath, with known history of tachycardia, has not been taking her metoprolol due to insurance issues.  We will obtain a troponin, chest x-ray for further evaluation.  Amount and/or Complexity of Data Reviewed Labs: ordered.    Details: Mildly elevated creatinine Radiology: ordered.    Details: CT abdomen pelvis, unremarkable Discussion of management or test interpretation with external provider(s): Disc with patient, repeatedly tried to give her antiemetics, she immediately vomits things up, she is finally agreed to a CT abdomen pelvis, CT ab pelvis shows no evidence of any obstruction.  Because of intractable nausea, vomiting, likely thought to be get due to gastroenteritis.  Patient is unable to tolerate any p.o. intake, thus will require admission to the hospitalist.  Chest x-ray, troponins, within normal limits.  Likely thought to be secondary to tachycardia,  which she states once tachycardia improved, chest pain and shortness of breath resolved.  She is established with a cardiologist.  This patient requires admission to hospitalist given intractable nausea/vomiting thought to be secondary to gastroenteritis.  No call from hospitalist, handed off to [facility], for admission  Risk Prescription drug management. Decision regarding hospitalization.    Final Clinical Impression(s) / ED Diagnoses Final diagnoses:  Intractable nausea and vomiting  Gastroenteritis  Chest pain, unspecified type    Rx / DC Orders ED Discharge Orders     None         Pete Pelt, PA 04/26/23 1930    Durwin Glaze, MD 04/27/23 [phone]

## 2023-04-26 NOTE — ED Notes (Signed)
 Patient transported to CT

## 2023-04-27 ENCOUNTER — Encounter (HOSPITAL_COMMUNITY): Payer: Self-pay | Admitting: Internal Medicine

## 2023-04-27 DIAGNOSIS — Z96643 Presence of artificial hip joint, bilateral: Secondary | ICD-10-CM | POA: Diagnosis present

## 2023-04-27 DIAGNOSIS — I10 Essential (primary) hypertension: Secondary | ICD-10-CM | POA: Diagnosis present

## 2023-04-27 DIAGNOSIS — I2510 Atherosclerotic heart disease of native coronary artery without angina pectoris: Secondary | ICD-10-CM | POA: Diagnosis present

## 2023-04-27 DIAGNOSIS — Z1152 Encounter for screening for COVID-19: Secondary | ICD-10-CM | POA: Diagnosis not present

## 2023-04-27 DIAGNOSIS — E860 Dehydration: Secondary | ICD-10-CM | POA: Diagnosis present

## 2023-04-27 DIAGNOSIS — N179 Acute kidney failure, unspecified: Secondary | ICD-10-CM | POA: Diagnosis present

## 2023-04-27 DIAGNOSIS — E66812 Obesity, class 2: Secondary | ICD-10-CM | POA: Diagnosis present

## 2023-04-27 DIAGNOSIS — E876 Hypokalemia: Secondary | ICD-10-CM | POA: Diagnosis not present

## 2023-04-27 DIAGNOSIS — Z9049 Acquired absence of other specified parts of digestive tract: Secondary | ICD-10-CM | POA: Diagnosis not present

## 2023-04-27 DIAGNOSIS — R112 Nausea with vomiting, unspecified: Secondary | ICD-10-CM | POA: Diagnosis not present

## 2023-04-27 DIAGNOSIS — F419 Anxiety disorder, unspecified: Secondary | ICD-10-CM | POA: Diagnosis present

## 2023-04-27 DIAGNOSIS — Z7982 Long term (current) use of aspirin: Secondary | ICD-10-CM | POA: Diagnosis not present

## 2023-04-27 DIAGNOSIS — Z87891 Personal history of nicotine dependence: Secondary | ICD-10-CM | POA: Diagnosis not present

## 2023-04-27 DIAGNOSIS — E785 Hyperlipidemia, unspecified: Secondary | ICD-10-CM | POA: Diagnosis present

## 2023-04-27 DIAGNOSIS — Z833 Family history of diabetes mellitus: Secondary | ICD-10-CM | POA: Diagnosis not present

## 2023-04-27 DIAGNOSIS — K529 Noninfective gastroenteritis and colitis, unspecified: Secondary | ICD-10-CM | POA: Diagnosis present

## 2023-04-27 DIAGNOSIS — E66811 Obesity, class 1: Secondary | ICD-10-CM | POA: Diagnosis not present

## 2023-04-27 DIAGNOSIS — R0789 Other chest pain: Secondary | ICD-10-CM | POA: Diagnosis not present

## 2023-04-27 DIAGNOSIS — Z885 Allergy status to narcotic agent status: Secondary | ICD-10-CM | POA: Diagnosis not present

## 2023-04-27 DIAGNOSIS — K760 Fatty (change of) liver, not elsewhere classified: Secondary | ICD-10-CM | POA: Diagnosis present

## 2023-04-27 DIAGNOSIS — Z79899 Other long term (current) drug therapy: Secondary | ICD-10-CM | POA: Diagnosis not present

## 2023-04-27 DIAGNOSIS — K219 Gastro-esophageal reflux disease without esophagitis: Secondary | ICD-10-CM | POA: Diagnosis present

## 2023-04-27 DIAGNOSIS — Z888 Allergy status to other drugs, medicaments and biological substances status: Secondary | ICD-10-CM | POA: Diagnosis not present

## 2023-04-27 DIAGNOSIS — Z860101 Personal history of adenomatous and serrated colon polyps: Secondary | ICD-10-CM | POA: Diagnosis not present

## 2023-04-27 DIAGNOSIS — Z8701 Personal history of pneumonia (recurrent): Secondary | ICD-10-CM | POA: Diagnosis not present

## 2023-04-27 DIAGNOSIS — Z8249 Family history of ischemic heart disease and other diseases of the circulatory system: Secondary | ICD-10-CM | POA: Diagnosis not present

## 2023-04-27 DIAGNOSIS — N289 Disorder of kidney and ureter, unspecified: Secondary | ICD-10-CM | POA: Diagnosis not present

## 2023-04-27 LAB — BASIC METABOLIC PANEL
Anion gap: 13 (ref 5–15)
BUN: 15 mg/dL (ref 8–23)
CO2: 21 mmol/L — ABNORMAL LOW (ref 22–32)
Calcium: 8.9 mg/dL (ref 8.9–10.3)
Chloride: 102 mmol/L (ref 98–111)
Creatinine, Ser: 0.82 mg/dL (ref 0.44–1.00)
GFR, Estimated: >60 mL/min (ref >60)
Glucose, Bld: 158 mg/dL — ABNORMAL HIGH (ref 70–99)
Potassium: 3.2 mmol/L — ABNORMAL LOW (ref 3.5–5.1)
Sodium: 136 mmol/L (ref 135–145)

## 2023-04-27 LAB — TROPONIN I (HIGH SENSITIVITY): Troponin I (High Sensitivity): 11 ng/L (ref <18)

## 2023-04-27 LAB — HIV ANTIBODY (ROUTINE TESTING W REFLEX): HIV Screen 4th Generation wRfx: NONREACTIVE

## 2023-04-27 MED ORDER — TRAZODONE HCL 50 MG PO TABS
25.0000 mg | ORAL_TABLET | Freq: Every evening | ORAL | Status: DC | PRN
  Administered 2023-04-28: 25 mg via ORAL
  Filled 2023-04-27: qty 1

## 2023-04-27 MED ORDER — BOOST / RESOURCE BREEZE PO LIQD CUSTOM
1.0000 | Freq: Three times a day (TID) | ORAL | Status: DC
  Administered 2023-04-27 (×2): 1 via ORAL

## 2023-04-27 MED ORDER — SODIUM CHLORIDE 0.9 % IV SOLN
12.5000 mg | Freq: Once | INTRAVENOUS | Status: AC
  Administered 2023-04-27: 12.5 mg via INTRAVENOUS
  Filled 2023-04-27: qty 12.5

## 2023-04-27 MED ORDER — ACETAMINOPHEN 325 MG PO TABS
650.0000 mg | ORAL_TABLET | Freq: Four times a day (QID) | ORAL | Status: DC | PRN
Start: 2023-04-27 — End: 2023-04-29
  Filled 2023-04-27: qty 2

## 2023-04-27 MED ORDER — PROMETHAZINE HCL 25 MG PO TABS
12.5000 mg | ORAL_TABLET | Freq: Four times a day (QID) | ORAL | Status: DC | PRN
  Administered 2023-04-28: 12.5 mg via ORAL
  Filled 2023-04-27 (×3): qty 1

## 2023-04-27 MED ORDER — LABETALOL HCL 5 MG/ML IV SOLN
10.0000 mg | Freq: Three times a day (TID) | INTRAVENOUS | Status: DC | PRN
  Administered 2023-04-27: 10 mg via INTRAVENOUS
  Filled 2023-04-27 (×3): qty 4

## 2023-04-27 MED ORDER — ASPIRIN 81 MG PO TBEC
81.0000 mg | DELAYED_RELEASE_TABLET | Freq: Every day | ORAL | Status: DC
  Administered 2023-04-27 – 2023-04-29 (×3): 81 mg via ORAL
  Filled 2023-04-27 (×3): qty 1

## 2023-04-27 MED ORDER — HYDROMORPHONE HCL 1 MG/ML IJ SOLN
1.0000 mg | INTRAMUSCULAR | Status: DC | PRN
  Administered 2023-04-27 – 2023-04-28 (×7): 1 mg via INTRAVENOUS
  Filled 2023-04-27 (×7): qty 1

## 2023-04-27 MED ORDER — ALBUTEROL SULFATE (2.5 MG/3ML) 0.083% IN NEBU: 2.5000 mg | INHALATION_SOLUTION | RESPIRATORY_TRACT | Status: DC | PRN

## 2023-04-27 MED ORDER — LORAZEPAM 1 MG PO TABS
1.0000 mg | ORAL_TABLET | Freq: Once | ORAL | Status: AC
  Administered 2023-04-27: 1 mg via ORAL
  Filled 2023-04-27: qty 1

## 2023-04-27 MED ORDER — PROCHLORPERAZINE EDISYLATE 10 MG/2ML IJ SOLN
10.0000 mg | Freq: Four times a day (QID) | INTRAMUSCULAR | Status: DC | PRN
  Administered 2023-04-27 – 2023-04-29 (×7): 10 mg via INTRAVENOUS
  Filled 2023-04-27 (×7): qty 2

## 2023-04-27 MED ORDER — ROSUVASTATIN CALCIUM 20 MG PO TABS
40.0000 mg | ORAL_TABLET | Freq: Every day | ORAL | Status: DC
  Administered 2023-04-27 – 2023-04-29 (×3): 40 mg via ORAL
  Filled 2023-04-27 (×3): qty 2

## 2023-04-27 MED ORDER — FENTANYL CITRATE PF 50 MCG/ML IJ SOSY
25.0000 ug | PREFILLED_SYRINGE | Freq: Once | INTRAMUSCULAR | Status: AC
  Administered 2023-04-27: 25 ug via INTRAVENOUS
  Filled 2023-04-27: qty 1

## 2023-04-27 MED ORDER — AMLODIPINE BESYLATE 5 MG PO TABS
5.0000 mg | ORAL_TABLET | Freq: Every day | ORAL | Status: DC
  Administered 2023-04-27 – 2023-04-28 (×3): 5 mg via ORAL
  Filled 2023-04-27 (×3): qty 1

## 2023-04-27 MED ORDER — ACETAMINOPHEN 650 MG RE SUPP: 650.0000 mg | Freq: Four times a day (QID) | RECTAL | Status: DC | PRN

## 2023-04-27 MED ORDER — ENOXAPARIN SODIUM 40 MG/0.4ML IJ SOSY
40.0000 mg | PREFILLED_SYRINGE | INTRAMUSCULAR | Status: DC
  Administered 2023-04-27 – 2023-04-29 (×3): 40 mg via SUBCUTANEOUS
  Filled 2023-04-27 (×3): qty 0.4

## 2023-04-27 MED ORDER — ASPIRIN 81 MG PO TBEC: 81.0000 mg | DELAYED_RELEASE_TABLET | Freq: Two times a day (BID) | ORAL | Status: DC

## 2023-04-27 MED ORDER — METOPROLOL TARTRATE 25 MG PO TABS
25.0000 mg | ORAL_TABLET | Freq: Two times a day (BID) | ORAL | Status: DC
Start: 2023-04-27 — End: 2023-04-29
  Administered 2023-04-27 – 2023-04-29 (×5): 25 mg via ORAL
  Filled 2023-04-27 (×5): qty 1

## 2023-04-27 MED ORDER — LABETALOL HCL 5 MG/ML IV SOLN
20.0000 mg | Freq: Once | INTRAVENOUS | Status: AC
Start: 2023-04-27 — End: 2023-04-27
  Administered 2023-04-27: 20 mg via INTRAVENOUS
  Filled 2023-04-27: qty 4

## 2023-04-27 MED ORDER — LISINOPRIL 20 MG PO TABS: 40.0000 mg | ORAL_TABLET | Freq: Every day | ORAL | Status: DC

## 2023-04-27 MED ORDER — METOPROLOL TARTRATE 25 MG PO TABS: 12.5000 mg | ORAL_TABLET | Freq: Every day | ORAL | Status: DC
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
## 2023-04-27 NOTE — ED Notes (Signed)
 [facility] called for transport.

## 2023-04-27 NOTE — ED Notes (Signed)
Report given to [facility] @ [facility]

## 2023-04-27 NOTE — Plan of Care (Signed)

## 2023-04-27 NOTE — Care Management Obs Status (Signed)
MEDICARE OBSERVATION STATUS NOTIFICATION   Patient Details  Name: Shannon Stuart MRN: [id number] Date of Birth: March 15, 1961   Medicare Observation Status Notification Given:  Yes    Epifanio Lesches, RN 04/27/2023, 10:08 AM

## 2023-04-28 ENCOUNTER — Inpatient Hospital Stay: Payer: Self-pay | Admitting: Cardiology

## 2023-04-28 DIAGNOSIS — Z6830 Body mass index (BMI) 30.0-30.9, adult: Secondary | ICD-10-CM

## 2023-04-28 DIAGNOSIS — N289 Disorder of kidney and ureter, unspecified: Secondary | ICD-10-CM

## 2023-04-28 DIAGNOSIS — E66811 Obesity, class 1: Secondary | ICD-10-CM | POA: Diagnosis not present

## 2023-04-28 DIAGNOSIS — R0789 Other chest pain: Secondary | ICD-10-CM

## 2023-04-28 DIAGNOSIS — R112 Nausea with vomiting, unspecified: Secondary | ICD-10-CM | POA: Diagnosis not present

## 2023-04-28 DIAGNOSIS — E876 Hypokalemia: Secondary | ICD-10-CM | POA: Diagnosis not present

## 2023-04-28 DIAGNOSIS — E6609 Other obesity due to excess calories: Secondary | ICD-10-CM

## 2023-04-28 LAB — BASIC METABOLIC PANEL
Anion gap: 13 (ref 5–15)
BUN: 21 mg/dL (ref 8–23)
CO2: 22 mmol/L (ref 22–32)
Calcium: 8.7 mg/dL — ABNORMAL LOW (ref 8.9–10.3)
Chloride: 100 mmol/L (ref 98–111)
Creatinine, Ser: 1.18 mg/dL — ABNORMAL HIGH (ref 0.44–1.00)
GFR, Estimated: 52 mL/min — ABNORMAL LOW (ref >60)
Glucose, Bld: 162 mg/dL — ABNORMAL HIGH (ref 70–99)
Potassium: 3.2 mmol/L — ABNORMAL LOW (ref 3.5–5.1)
Sodium: 135 mmol/L (ref 135–145)

## 2023-04-28 LAB — C DIFFICILE QUICK SCREEN W PCR REFLEX
C Diff antigen: NEGATIVE
C Diff interpretation: NOT DETECTED
C Diff toxin: NEGATIVE

## 2023-04-28 LAB — CBC
HCT: 43.8 % (ref 36.0–46.0)
Hemoglobin: 13.5 g/dL (ref 12.0–15.0)
MCH: 27.2 pg (ref 26.0–34.0)
MCHC: 30.8 g/dL (ref 30.0–36.0)
MCV: 88.3 fL (ref 80.0–100.0)
Platelets: 247 10*3/uL (ref 150–400)
RBC: 4.96 MIL/uL (ref 3.87–5.11)
RDW: 17.4 % — ABNORMAL HIGH (ref 11.5–15.5)
WBC: 10.3 10*3/uL (ref 4.0–10.5)
nRBC: 0 % (ref 0.0–0.2)

## 2023-04-28 MED ORDER — POTASSIUM CHLORIDE 20 MEQ PO PACK
40.0000 meq | PACK | Freq: Once | ORAL | Status: AC
Start: 2023-04-28 — End: 2023-04-28
  Administered 2023-04-28: 40 meq via ORAL
  Filled 2023-04-28: qty 2

## 2023-04-28 MED ORDER — SODIUM CHLORIDE 0.9 % IV SOLN
12.5000 mg | Freq: Four times a day (QID) | INTRAVENOUS | Status: DC | PRN
  Administered 2023-04-28: 12.5 mg via INTRAVENOUS
  Filled 2023-04-28: qty 12.5
  Filled 2023-04-28: qty 0.5

## 2023-04-28 MED ORDER — LACTATED RINGERS IV SOLN: INTRAVENOUS | Status: DC

## 2023-04-28 MED ORDER — METOCLOPRAMIDE HCL 5 MG PO TABS
5.0000 mg | ORAL_TABLET | Freq: Three times a day (TID) | ORAL | Status: DC
  Administered 2023-04-28 – 2023-04-29 (×2): 5 mg via ORAL
  Filled 2023-04-28 (×2): qty 1

## 2023-04-28 MED ORDER — HYDROMORPHONE HCL 1 MG/ML IJ SOLN
0.5000 mg | INTRAMUSCULAR | Status: DC | PRN
  Administered 2023-04-28 – 2023-04-29 (×4): 0.5 mg via INTRAVENOUS
  Filled 2023-04-28 (×4): qty 0.5

## 2023-04-28 MED ORDER — LOPERAMIDE HCL 2 MG PO CAPS
2.0000 mg | ORAL_CAPSULE | ORAL | Status: DC | PRN
  Administered 2023-04-28 (×2): 2 mg via ORAL
  Filled 2023-04-28 (×2): qty 1

## 2023-04-28 MED ORDER — PANTOPRAZOLE SODIUM 40 MG IV SOLR
40.0000 mg | INTRAVENOUS | Status: DC
Start: 2023-04-28 — End: 2023-04-29
  Administered 2023-04-28: 40 mg via INTRAVENOUS
  Filled 2023-04-28: qty 10

## 2023-04-28 NOTE — Progress Notes (Signed)
Progress Note   Patient: Shannon Stuart ZOX:[id number] DOB: 1960/03/31 DOA: 04/26/2023     1 DOS: the patient was seen and examined on 04/28/2023   Brief hospital course: Shannon Stuart is a 63 y.o. female with medical history significant for depression, GERD, palpitations, hypertension being admitted to the hospital for intractable nausea and vomiting.    Assessment and Plan: Intractable nausea, vomiting and diarrhea. Question viral gastroenteritis. Continue gentle IV fluids. Clear liquid diet for now and advance as tolerated. Continue Phenergan as needed for nausea. Started on Reglan therapy. Imodium as needed for diarrhea. GI panel pending at this time. Continue mental precautions.  Mild renal insufficiency: In the setting of severe diarrhea, vomiting. Continue to monitor daily renal function. Gentle IV fluids.  Hypokalemia: Oral potassium supplements ordered. Continue to monitor daily electrolyte.  Atypical chest pain: Seen by cardiology as outpatient, low calcium score, troponin negative. Continue metoprolol 25 twice daily. Will give her PPI.    Out of bed to chair. Incentive spirometry. Nursing supportive care. Fall, aspiration precautions. DVT prophylaxis   Code Status: Full Code  Subjective: Patient is seen and examined today morning.  She complains of feeling nauseous asking for Phenergan, states she has allergy to Zofran.  Also asked for Imodium for loose.  Physical Exam: Vitals:   04/27/23 1133 04/27/23 2036 04/28/23 0524 04/28/23 1334  BP: (!) 142/96 (!) 138/92 (!) 131/99 (!) 135/93  Pulse: 82 79 85 70  Resp: 18 18 16 20   Temp: 99 F (37.2 C) 98.3 F (36.8 C) 99.4 F (37.4 C) 98.8 F (37.1 C)  TempSrc: Oral Oral Oral Oral  SpO2: 100% 99% 98% 99%  Weight:      Height:        General - Elderly ill obese African-American female, in distress secondary to nausea HEENT - PERRLA, EOMI, atraumatic head, non tender sinuses. Lung - Clear, basal rales,  rhonchi, wheezes. Heart - S1, S2 heard, no murmurs, rubs, trace pedal edema. Abdomen - Soft, non tender, obese, bowel sounds good Neuro - Alert, awake and oriented x 3, non focal exam. Skin - Warm and dry.  Data Reviewed:      Latest Ref Rng & Units 04/28/2023    5:33 AM 04/26/2023   10:26 AM 02/07/2023    3:16 AM  CBC  WBC 4.0 - 10.5 K/uL 10.3  14.7  13.2   Hemoglobin 12.0 - 15.0 g/dL 81.1  91.4  9.7   Hematocrit 36.0 - 46.0 % 43.8  42.3  30.2   Platelets 150 - 400 K/uL 247  286  159       Latest Ref Rng & Units 04/28/2023    5:33 AM 04/27/2023    9:44 AM 04/26/2023   10:26 AM  BMP  Glucose 70 - 99 mg/dL 782  956  213   BUN 8 - 23 mg/dL 21  15  13    Creatinine 0.44 - 1.00 mg/dL 0.86  5.78  4.69   Sodium 135 - 145 mmol/L 135  136  135   Potassium 3.5 - 5.1 mmol/L 3.2  3.2  3.4   Chloride 98 - 111 mmol/L 100  102  100   CO2 22 - 32 mmol/L 22  21  22    Calcium 8.9 - 10.3 mg/dL 8.7  8.9  9.4    CT ABDOMEN PELVIS WO CONTRAST Result Date: 04/26/2023 CLINICAL DATA:  Nausea and vomiting, suspected bowel obstruction, chest pain EXAM: CT ABDOMEN AND PELVIS WITHOUT CONTRAST TECHNIQUE: Multidetector  CT imaging of the abdomen and pelvis was performed following the standard protocol without IV contrast. RADIATION DOSE REDUCTION: This exam was performed according to the departmental dose-optimization program which includes automated exposure control, adjustment of the mA and/or kV according to patient size and/or use of iterative reconstruction technique. COMPARISON:  CTA chest 02/05/2023, and previous FINDINGS: Lower chest: No pleural effusion. Trace pericardial fluid. Visualized lung bases clear. Hepatobiliary: No focal liver abnormality is seen. Status post cholecystectomy. No biliary dilatation. Pancreas: Unremarkable. No pancreatic ductal dilatation or surrounding inflammatory changes. Spleen: Normal in size without focal abnormality. Adrenals/Urinary Tract: Adrenal glands are unremarkable. Kidneys  are normal, without renal calculi, focal lesion, or hydronephrosis. Bladder is unremarkable. Stomach/Bowel: Stomach and small bowel incompletely distended, unremarkable. Normal appendix. Colon is partially distended by gas and fluid proximally, relatively decompressed distally, without acute finding. Vascular/Lymphatic: Mild scattered aortoiliac calcified atheromatous plaque. No abdominal or pelvic adenopathy. Reproductive: Uterus and bilateral adnexa are unremarkable. Other: Pelvic phleboliths.  No ascites.  No free air. Musculoskeletal: Mild coarse sclerosis throughout the visualized thoracolumbar spine and sacrum with scattered small lucencies, similar to that seen 08/15/2020. Post bilateral hip arthroplasty. Old avulsion deformity from the right ischial tuberosity. IMPRESSION: 1. No acute findings. 2.  Aortic Atherosclerosis (ICD10-I70.0). Electronically Signed   By: Corlis Leak M.D.   On: 04/26/2023 18:02     Family Communication: Discussed with patient, she understand and agree. All questions answereed.    Disposition: Status is: Inpatient Remains inpatient appropriate because: Intractable nausea and vomiting, diarrhea  Planned Discharge Destination: Home     Time spent: 37 minutes  Author: Marcelino Duster, MD 04/28/2023 2:20 PM Secure chat 7am to 7pm For on call review [email or web address].

## 2023-04-28 NOTE — Plan of Care (Signed)

## 2023-04-28 NOTE — Progress Notes (Signed)
   04/28/23 1500  Spiritual Encounters  Type of Visit Initial  Care provided to: Patient  Referral source Chaplain assessment  Reason for visit Urgent spiritual support  OnCall Visit No  Spiritual Framework  Presenting Themes Coping tools  Patient Stress Factors Major life changes   Chaplain met with pt at bedside who was resting with darkened windows and quiet TV.  No emotional distress exhibited at this time.  Appreciated visit.  Ended visit with a departing blessing.

## 2023-04-28 NOTE — Progress Notes (Deleted)
 Cardiology Office Note:  .   Date:  04/28/2023  ID:  Halsey Persaud, DOB [birth year], MRN [id number] PCP:  Nestor Ramp, MD  Former Cardiology Providers: [facility] Providers Cardiologist:  Tessa Lerner, DO , [facility] (established care 04/28/23) Electrophysiologist:  None  Click to update primary MD,subspecialty MD or APP then REFRESH:1}    No chief complaint on file.   History of Present Illness: .   Shannon Stuart is a 63 y.o. *** female whose past medical history and cardiovascular risk factors includes: ***  Review of Systems: .   ROS  Studies Reviewed:   EKG: EKG Interpretation Date/Time:    Ventricular Rate:    PR Interval:    QRS Duration:    QT Interval:    QTC Calculation:   R Axis:      Text Interpretation:    Echocardiogram: 02/05/2023  1. Left ventricular ejection fraction, by estimation, is 60 to 65%. The left ventricle has normal function. The left ventricle has no regional  wall motion abnormalities. There is mild concentric left ventricular hypertrophy. Left ventricular diastolic parameters were normal.   2. Right ventricular systolic function is normal. The right ventricular size is normal. Tricuspid regurgitation signal is inadequate for assessing PA pressure.   3. The mitral valve is grossly normal. No evidence of mitral valve regurgitation. No evidence of mitral stenosis.   4. The aortic valve is grossly normal. Aortic valve regurgitation is not visualized. No aortic stenosis is present.   CCTA 03/03/2023 1. 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  2. Total plaque volume 7 mm3 which is 25th percentile for age- and sex-matched controls (calcified plaque 0 mm3; non-calcified plaque 7 mm3). TPV is mild.  3. Coronary calcium score of 0.  4. Normal coronary origins with right dominance. Tortuous coronary arteries.  5. Radiology Overread is pending.   Cardiac monitor: March 10, 2023-December 30th 2024 Dominant rhythm sinus, tachycardia  burden (18%). Heart rate 46-164 bpm.  Avg HR 87 bpm. No atrial fibrillation detected during the monitoring period. No ventricular tachycardia, high grade AV block, pauses (3 seconds or longer). Total supraventricular ectopic burden <1%. Fastest episode of SVT: 03/20/2023: 3:06 AM, 11 beats, 5.3 seconds, average HR 137 bpm, max HR 164 bpm Total ventricular ectopic burden <1%. Patient triggered events: 8.  Underlying rhythm predominantly sinus with rare supraventricular ectopic beats.     RADIOLOGY: ***  Risk Assessment/Calculations:   ***   Labs:       Latest Ref Rng & Units 04/28/2023    5:33 AM 04/26/2023   10:26 AM 02/07/2023    3:16 AM  CBC  WBC 4.0 - 10.5 K/uL 10.3  14.7  13.2   Hemoglobin 12.0 - 15.0 g/dL 40.9  81.1  9.7   Hematocrit 36.0 - 46.0 % 43.8  42.3  30.2   Platelets 150 - 400 K/uL 247  286  159        Latest Ref Rng & Units 04/28/2023    5:33 AM 04/27/2023    9:44 AM 04/26/2023   10:26 AM  BMP  Glucose 70 - 99 mg/dL 914  782  956   BUN 8 - 23 mg/dL 21  15  13    Creatinine 0.44 - 1.00 mg/dL 2.13  0.86  5.78   Sodium 135 - 145 mmol/L 135  136  135   Potassium 3.5 - 5.1 mmol/L 3.2  3.2  3.4   Chloride 98 - 111 mmol/L 100  102  100   CO2 22 - 32 mmol/L 22  21  22    Calcium 8.9 - 10.3 mg/dL 8.7  8.9  9.4       Latest Ref Rng & Units 04/28/2023    5:33 AM 04/27/2023    9:44 AM 04/26/2023   10:32 AM  CMP  Glucose 70 - 99 mg/dL 161  096    BUN 8 - 23 mg/dL 21  15    Creatinine 0.45 - 1.00 mg/dL 4.09  8.11    Sodium 914 - 145 mmol/L 135  136    Potassium 3.5 - 5.1 mmol/L 3.2  3.2    Chloride 98 - 111 mmol/L 100  102    CO2 22 - 32 mmol/L 22  21    Calcium 8.9 - 10.3 mg/dL 8.7  8.9    Total Protein 6.5 - 8.1 g/dL   8.7   Total Bilirubin 0.0 - 1.2 mg/dL   0.6   Alkaline Phos 38 - 126 U/L   115   AST 15 - 41 U/L   34   ALT 0 - 44 U/L   27     Lab Results  Component Value Date   CHOL 95 02/06/2023   HDL 41 02/06/2023   LDLCALC 27 02/06/2023   LDLDIRECT 53  02/22/2022   TRIG 134 02/06/2023   CHOLHDL 2.3 02/06/2023   No results for input(s): "LIPOA" in the last 8760 hours. No components found for: "NTPROBNP" No results for input(s): "PROBNP" in the last 8760 hours. Recent Labs    10/25/22 1628  TSH 1.150    ***  Physical Exam:   There were no vitals filed for this visit. There is no height or weight on file to calculate BMI. Wt Readings from Last 3 Encounters:  04/27/23 199 lb 14.4 oz (90.7 kg)  03/10/23 216 lb (98 kg)  03/05/23 216 lb 3.2 oz (98.1 kg)    Physical Exam   Impression & Recommendation(s):  Impression: No diagnosis found.   Recommendation(s):  ***  Orders Placed:  No orders of the defined types were placed in this encounter.   As part of medical decision making ***  Final Medication List:   No orders of the defined types were placed in this encounter.   There are no discontinued medications.  No current facility-administered medications for this visit. No current outpatient medications on file.  Facility-Administered Medications Ordered in Other Visits:    acetaminophen (TYLENOL) tablet 650 mg, 650 mg, Oral, Q6H PRN **OR** acetaminophen (TYLENOL) suppository 650 mg, 650 mg, Rectal, Q6H PRN, Kirby Crigler, Mir M, MD   albuterol (PROVENTIL) (2.5 MG/3ML) 0.083% nebulizer solution 2.5 mg, 2.5 mg, Nebulization, Q2H PRN, Kirby Crigler, Mir M, MD   amLODipine (NORVASC) tablet 5 mg, 5 mg, Oral, QHS, Baron, Lauren E, PA, 5 mg at 04/27/23 2141   aspirin EC tablet 81 mg, 81 mg, Oral, Daily, Kirby Crigler, Mir M, MD, 81 mg at 04/27/23 1130   enoxaparin (LOVENOX) injection 40 mg, 40 mg, Subcutaneous, Q24H, Kirby Crigler, Mir M, MD, 40 mg at 04/27/23 7829   feeding supplement (BOOST / RESOURCE BREEZE) liquid 1 Container, 1 Container, Oral, TID BM, Sundil, Subrina, MD, 1 Container at 04/27/23 2141   HYDROmorphone (DILAUDID) injection 1 mg, 1 mg, Intravenous, Q4H PRN, Kirby Crigler, Mir M, MD, 1 mg at 04/28/23 0538   iohexol  (OMNIPAQUE) 300 MG/ML solution 75 mL, 75 mL, Intravenous, Once PRN, Small, Brooke L, PA   labetalol (NORMODYNE) injection 10 mg, 10 mg, Intravenous,  Q8H PRN, Janalyn Shy, Subrina, MD, 10 mg at 04/27/23 9811   metoprolol tartrate (LOPRESSOR) tablet 25 mg, 25 mg, Oral, BID, Kirby Crigler, Mir M, MD, 25 mg at 04/27/23 2141   prochlorperazine (COMPAZINE) injection 10 mg, 10 mg, Intravenous, Q6H PRN, Janalyn Shy, Subrina, MD, 10 mg at 04/28/23 9147   promethazine (PHENERGAN) tablet 12.5 mg, 12.5 mg, Oral, Q6H PRN, Kirby Crigler, Mir M, MD   rosuvastatin (CRESTOR) tablet 40 mg, 40 mg, Oral, Daily, Kirby Crigler, Mir M, MD, 40 mg at 04/27/23 8295   traZODone (DESYREL) tablet 25 mg, 25 mg, Oral, QHS PRN, Kirby Crigler, Mir Judie Petit, MD  Consent:   ***  Disposition:   *** Patient may be asked to follow-up sooner based on the results of the above-mentioned testing.  Her questions and concerns were addressed to her satisfaction. She voices understanding of the recommendations provided during this encounter.    Signed, Tessa Lerner, DO, [facility]  [facility]  [address] [place], [place] [address] 04/28/2023 8:36 AM

## 2023-04-29 DIAGNOSIS — E66811 Obesity, class 1: Secondary | ICD-10-CM | POA: Diagnosis not present

## 2023-04-29 DIAGNOSIS — E876 Hypokalemia: Secondary | ICD-10-CM | POA: Diagnosis not present

## 2023-04-29 DIAGNOSIS — N289 Disorder of kidney and ureter, unspecified: Secondary | ICD-10-CM | POA: Diagnosis not present

## 2023-04-29 DIAGNOSIS — R112 Nausea with vomiting, unspecified: Secondary | ICD-10-CM | POA: Diagnosis not present

## 2023-04-29 LAB — BASIC METABOLIC PANEL
Anion gap: 9 (ref 5–15)
BUN: 19 mg/dL (ref 8–23)
CO2: 24 mmol/L (ref 22–32)
Calcium: 8.8 mg/dL — ABNORMAL LOW (ref 8.9–10.3)
Chloride: 103 mmol/L (ref 98–111)
Creatinine, Ser: 0.91 mg/dL (ref 0.44–1.00)
GFR, Estimated: >60 mL/min (ref >60)
Glucose, Bld: 140 mg/dL — ABNORMAL HIGH (ref 70–99)
Potassium: 3.6 mmol/L (ref 3.5–5.1)
Sodium: 136 mmol/L (ref 135–145)

## 2023-04-29 LAB — GASTROINTESTINAL PANEL BY PCR, STOOL (REPLACES STOOL CULTURE)

## 2023-04-29 MED ORDER — METOCLOPRAMIDE HCL 5 MG PO TABS: 5.0000 mg | ORAL_TABLET | Freq: Three times a day (TID) | ORAL | 0 refills | Status: DC

## 2023-04-29 MED ORDER — PANTOPRAZOLE SODIUM 40 MG PO TBEC
40.0000 mg | DELAYED_RELEASE_TABLET | Freq: Every day | ORAL | Status: DC
  Administered 2023-04-29: 40 mg via ORAL
  Filled 2023-04-29: qty 1

## 2023-04-29 NOTE — Plan of Care (Signed)
 Patient discharged home with reglan . MD awaiting to see if pt is able to tolerate soft diet. Paperwork went over with patient, medications, and pharmacy information. PIV will be removed once pt is ready to go. No further needs atm.  Problem: Education: Goal: Knowledge of General Education information will improve Description: Including pain rating scale, medication(s)/side effects and non-pharmacologic comfort measures Outcome: Progressing   Problem: Health Behavior/Discharge Planning: Goal: Ability to manage health-related needs will improve Outcome: Progressing   Problem: Clinical Measurements: Goal: Ability to maintain clinical measurements within normal limits will improve Outcome: Progressing Goal: Will remain free from infection Outcome: Progressing Goal: Diagnostic test results will improve Outcome: Progressing Goal: Respiratory complications will improve Outcome: Progressing Goal: Cardiovascular complication will be avoided Outcome: Progressing   Problem: Activity: Goal: Risk for activity intolerance will decrease Outcome: Progressing   Problem: Nutrition: Goal: Adequate nutrition will be maintained Outcome: Progressing   Problem: Coping: Goal: Level of anxiety will decrease Outcome: Progressing   Problem: Elimination: Goal: Will not experience complications related to bowel motility Outcome: Progressing Goal: Will not experience complications related to urinary retention Outcome: Progressing   Problem: Pain Managment: Goal: General experience of comfort will improve and/or be controlled Outcome: Progressing   Problem: Safety: Goal: Ability to remain free from injury will improve Outcome: Progressing   Problem: Skin Integrity: Goal: Risk for impaired skin integrity will decrease Outcome: Progressing

## 2023-04-30 ENCOUNTER — Telehealth: Payer: Self-pay

## 2023-04-30 NOTE — Transitions of Care (Post Inpatient/ED Visit) (Signed)
 04/30/2023  Name: Shannon Stuart MRN: [id number] DOB: 03-Nov-1960  Today's TOC FU Call Status: Today's TOC FU Call Status:: Successful TOC FU Call Completed TOC FU Call Complete Date: 04/30/23 Patient's Name and Date of Birth confirmed.  Transition Care Management Follow-up Telephone Call Date of Discharge: 04/29/23 Discharge Facility: [facility]) Type of Discharge: Inpatient Admission Primary Inpatient Discharge Diagnosis:: nausea/ vomiting How have you been since you were released from the hospital?: Better Any questions or concerns?: No  Items Reviewed: Did you receive and understand the discharge instructions provided?: Yes Medications obtained,verified, and reconciled?: Yes (Medications Reviewed) Any new allergies since your discharge?: No Dietary orders reviewed?: Yes Do you have support at home?: Yes People in Home: spouse  Medications Reviewed Today: Medications Reviewed Today     Reviewed by Emmitt Pan, LPN (Licensed Practical Nurse) on 04/30/23 at 0930  Med List Status: <None>   Medication Order Taking? Sig Documenting Provider Last Dose Status Informant  ALPRAZolam  (XANAX ) 1 MG tablet [id number] No TAKE 1 TABLET BY MOUTH 3 TIMES A DAY [name], MD Past Week Active Self, Pharmacy Records  amLODipine  (NORVASC ) 5 MG tablet [id number] No Take 1 tablet (5 mg total) by mouth at bedtime. [name], MD Past Week Active Self, Pharmacy Records  aspirin  EC 81 MG tablet [id number] No Take 1 tablet (81 mg total) by mouth 2 (two) times daily after a meal. For 2 weeks then back to baseline once a day for DVT prevention.  Patient taking differently: Take 81 mg by mouth daily.   Lenis Barter, PA-C Past Week Active Self, Pharmacy Records  Cholecalciferol (VITAMIN D ) 125 MCG (5000 UT) CAPS [id number] No Take 5,000 Units by mouth daily. [provider] Past Week Active Self, Pharmacy Records  cyclobenzaprine  (FLEXERIL ) 10 MG tablet [id number] No Take 1 tablet (10 mg  total) by mouth 3 (three) times daily. [name], MD Past Week Active Self, Pharmacy Records  diclofenac  Sodium (VOLTAREN ) 1 % GEL [id number] No APPLY 2 GRAMS TOPICALLY FOUR TIMES DAILY  Patient taking differently: 2 g daily as needed (pain).   [name], MD Taking Active Self, Pharmacy Records  furosemide  (LASIX ) 20 MG tablet [id number] No TAKE 1 TABLET EVERY DAY [name], MD Past Week Active Self, Pharmacy Records  lidocaine  (LIDODERM ) 5 % [id number] No Use one or two patches for up to 12 hours once a day as directed and discard patch after 12 hours  Patient taking differently: Place 1-2 patches onto the skin daily as needed (pain).   [name], MD Taking Active Self, Pharmacy Records  lisinopril  (ZESTRIL ) 40 MG tablet [id number] No Take 1 tablet (40 mg total) by mouth daily. [name], MD Past Week Active Self, Pharmacy Records  MAGNESIUM  PO [id number] No Take 1 tablet by mouth daily. [provider] Past Week Active Self, Pharmacy Records  metoCLOPramide  (REGLAN ) 5 MG tablet [id number]  Take 1 tablet (5 mg total) by mouth 3 (three) times daily before meals for 10 days. Darci Pore, MD  Active   metoprolol  tartrate (LOPRESSOR ) 25 MG tablet [id number] No TAKE 1/2 TABLET BY MOUTH DAILY AS NEEDED FOR PALPITATIONS  Patient taking differently: Take 25 mg by mouth 2 (two) times daily.   Tolia, Sunit, DO Past Week Active Self, Pharmacy Records           Med Note ARNELL, RICO SAILOR   Sun Apr 27, 2023  8:20 AM) Pt states her dr changed this to  1 tablets bid  Multiple Vitamins-Minerals (MULTIVITAMIN WITH MINERALS) tablet [id number] No Take 1 tablet by mouth daily. [provider] Past Week Active Self, Pharmacy Records  Omega-3 Fatty Acids (FISH OIL) 1000 MG CAPS [id number] No Take 1,000 mg by mouth daily.  [provider] Past Week Active Self, Pharmacy Records  OVER THE COUNTER MEDICATION [id number] No Take 1 capsule by mouth daily. [place],  [place], [place], [employer], Control [employer] Instrumentation Engineer, Historical, MD Past Week Active Self, Pharmacy Records  oxyCODONE -acetaminophen  (PERCOCET) 10-325 MG tablet [id number] No Take 1 tablet by mouth 3 (three) times daily. [provider] Past Week Active Self, Pharmacy Records  pantoprazole  (PROTONIX ) 40 MG tablet [id number] No TAKE 1 TABLET DAILY. PATIENT NEEDS FOLLOW UP APPOINTMENT FOR FUTURE REFILLS. PLEASE CALL [phone] TO SCHEDULE. [name], MD Past Week Active Self, Pharmacy Records  Potassium 99 MG TABS [id number] No Take 99 mg by mouth daily. [provider] Past Week Active Self, Pharmacy Records  promethazine  (PHENERGAN ) 25 MG suppository [id number] No Place 1 suppository (25 mg total) rectally every 6 (six) hours as needed for nausea or vomiting. [name], MD Taking Active Self, Pharmacy Records  rosuvastatin  (CRESTOR ) 40 MG tablet [id number] No Take 1 tablet (40 mg total) by mouth daily. [name], MD Past Week Active Self, Pharmacy Records  traZODone  (DESYREL ) 100 MG tablet [id number] No Take 1 tablet (100 mg total) by mouth at bedtime. [name], MD Past Week Active Self, Pharmacy Records            Home Care and Equipment/Supplies: Were Home Health Services Ordered?: NA Any new equipment or medical supplies ordered?: NA  Functional Questionnaire: Do you need assistance with bathing/showering or dressing?: No Do you need assistance with meal preparation?: No Do you need assistance with eating?: No Do you have difficulty maintaining continence: No Do you need assistance with getting out of bed/getting out of a chair/moving?: No Do you have difficulty managing or taking your medications?: No  Follow up appointments reviewed: PCP Follow-up appointment confirmed?: No (no avail appt, sent message to staff to schedule) MD Provider Line [phone] Given: No Specialist Hospital Follow-up appointment confirmed?: NA Do you need transportation  to your follow-up appointment?: No Do you understand care options if your condition(s) worsen?: Yes-patient verbalized understanding    SIGNATURE Julian Lemmings, LPN [facility] Nurse Health Advisor Direct Dial [phone]

## 2023-04-30 NOTE — Discharge Summary (Signed)
 Physician Discharge Summary   Patient: Shannon Stuart MRN: [id number] DOB: 1961/02/28  Admit date:     04/26/2023  Discharge date: 04/29/2023  Discharge Physician: Concepcion Riser   PCP: [name], MD   Recommendations at discharge:    PCP follow up in 1 week  Discharge Diagnoses: Principal Problem:   Intractable nausea and vomiting  Resolved Problems:   * No resolved hospital problems. *  Hospital Course: Aniayah Alaniz is a 63 y.o. female with medical history significant for depression, GERD, palpitations, hypertension being admitted to the hospital for intractable nausea and vomiting.  Assessment and Plan: Intractable nausea, vomiting and diarrhea. States that she had multiple prior similar episodes, on and off flares up. Advised to quit marijuana use. Symptoms improved with Phenergan  and Reglan  therapy.  States she is allergic to Zofran . Able to tolerate diet well. Reglan  prescription given. Advised to follow-up with PCP and GI upon discharge as instructed.   Mild renal insufficiency: In the setting of severe diarrhea, vomiting. Kidney dysfunction improved with IV fluids. Outpatient follow-up with PCP.   Hypokalemia: Improved with oral supplements.   Atypical chest pain: Seen by cardiology as outpatient, low calcium  score, troponin negative. Continue metoprolol  25 twice daily.  Advised to continue taking PPI.  Obesity: BMI 30.39 Diet, exercise and weight reduction advised.      Consultants: None Procedures performed: None Disposition: Home Diet recommendation:  Discharge Diet Orders (From admission, onward)     Start     Ordered   04/29/23 0000  Diet - low sodium heart healthy        04/29/23 1053           Cardiac diet DISCHARGE MEDICATION: Allergies as of 04/29/2023       Reactions   Morphine  Anaphylaxis   Zofran  [ondansetron  Hcl] Nausea And Vomiting        Medication List     TAKE these medications    ALPRAZolam  1 MG  tablet Commonly known as: XANAX  TAKE 1 TABLET BY MOUTH 3 TIMES A DAY   amLODipine  5 MG tablet Commonly known as: NORVASC  Take 1 tablet (5 mg total) by mouth at bedtime.   aspirin  EC 81 MG tablet Take 1 tablet (81 mg total) by mouth 2 (two) times daily after a meal. For 2 weeks then back to baseline once a day for DVT prevention. What changed:  when to take this additional instructions   cyclobenzaprine  10 MG tablet Commonly known as: FLEXERIL  Take 1 tablet (10 mg total) by mouth 3 (three) times daily.   diclofenac  Sodium 1 % Gel Commonly known as: VOLTAREN  APPLY 2 GRAMS TOPICALLY FOUR TIMES DAILY What changed: See the new instructions.   Fish Oil 1000 MG Caps Take 1,000 mg by mouth daily.   furosemide  20 MG tablet Commonly known as: LASIX  TAKE 1 TABLET EVERY DAY   lidocaine  5 % Commonly known as: Lidoderm  Use one or two patches for up to 12 hours once a day as directed and discard patch after 12 hours What changed:  how much to take how to take this when to take this reasons to take this additional instructions   lisinopril  40 MG tablet Commonly known as: ZESTRIL  Take 1 tablet (40 mg total) by mouth daily.   MAGNESIUM  PO Take 1 tablet by mouth daily.   metoCLOPramide  5 MG tablet Commonly known as: REGLAN  Take 1 tablet (5 mg total) by mouth 3 (three) times daily before meals for 10 days.  metoprolol  tartrate 25 MG tablet Commonly known as: LOPRESSOR  TAKE 1/2 TABLET BY MOUTH DAILY AS NEEDED FOR PALPITATIONS What changed: See the new instructions.   multivitamin with minerals tablet Take 1 tablet by mouth daily.   OVER THE COUNTER MEDICATION Take 1 capsule by mouth daily. [address], [place], [name], [employer], Ginger   oxyCODONE -acetaminophen  10-325 MG tablet Commonly known as: PERCOCET Take 1 tablet by mouth 3 (three) times daily.   pantoprazole  40 MG tablet Commonly known as: PROTONIX  TAKE 1 TABLET DAILY. PATIENT NEEDS FOLLOW UP  APPOINTMENT FOR FUTURE REFILLS. PLEASE CALL [phone] TO SCHEDULE.   Potassium 99 MG Tabs Take 99 mg by mouth daily.   promethazine  25 MG suppository Commonly known as: PHENERGAN  Place 1 suppository (25 mg total) rectally every 6 (six) hours as needed for nausea or vomiting.   rosuvastatin  40 MG tablet Commonly known as: CRESTOR  Take 1 tablet (40 mg total) by mouth daily.   traZODone  100 MG tablet Commonly known as: DESYREL  Take 1 tablet (100 mg total) by mouth at bedtime.   Vitamin D  125 MCG (5000 UT) Caps Take 5,000 Units by mouth daily.        Discharge Exam: Filed Weights   04/27/23 0406  Weight: 90.7 kg   General - Elderly obese African-American female, no acute distress HEENT - PERRLA, EOMI, atraumatic head, non tender sinuses. Lung - Clear, basal rales, rhonchi, wheezes. Heart - S1, S2 heard, no murmurs, rubs, trace pedal edema. Abdomen - Soft, non tender, obese, bowel sounds good Neuro - Alert, awake and oriented x 3, non focal exam. Skin - Warm and dry.  Condition at discharge: stable  The results of significant diagnostics from this hospitalization (including imaging, microbiology, ancillary and laboratory) are listed below for reference.   Imaging Studies: CT ABDOMEN PELVIS WO CONTRAST Result Date: 04/26/2023 CLINICAL DATA:  Nausea and vomiting, suspected bowel obstruction, chest pain EXAM: CT ABDOMEN AND PELVIS WITHOUT CONTRAST TECHNIQUE: Multidetector CT imaging of the abdomen and pelvis was performed following the standard protocol without IV contrast. RADIATION DOSE REDUCTION: This exam was performed according to the departmental dose-optimization program which includes automated exposure control, adjustment of the mA and/or kV according to patient size and/or use of iterative reconstruction technique. COMPARISON:  CTA chest 02/05/2023, and previous FINDINGS: Lower chest: No pleural effusion. Trace pericardial fluid. Visualized lung bases clear.  Hepatobiliary: No focal liver abnormality is seen. Status post cholecystectomy. No biliary dilatation. Pancreas: Unremarkable. No pancreatic ductal dilatation or surrounding inflammatory changes. Spleen: Normal in size without focal abnormality. Adrenals/Urinary Tract: Adrenal glands are unremarkable. Kidneys are normal, without renal calculi, focal lesion, or hydronephrosis. Bladder is unremarkable. Stomach/Bowel: Stomach and small bowel incompletely distended, unremarkable. Normal appendix. Colon is partially distended by gas and fluid proximally, relatively decompressed distally, without acute finding. Vascular/Lymphatic: Mild scattered aortoiliac calcified atheromatous plaque. No abdominal or pelvic adenopathy. Reproductive: Uterus and bilateral adnexa are unremarkable. Other: Pelvic phleboliths.  No ascites.  No free air. Musculoskeletal: Mild coarse sclerosis throughout the visualized thoracolumbar spine and sacrum with scattered small lucencies, similar to that seen 08/15/2020. Post bilateral hip arthroplasty. Old avulsion deformity from the right ischial tuberosity. IMPRESSION: 1. No acute findings. 2.  Aortic Atherosclerosis (ICD10-I70.0). Electronically Signed   By: JONETTA Faes M.D.   On: 04/26/2023 18:02   DG Chest 2 View Result Date: 04/26/2023 CLINICAL DATA:  Chest pain EXAM: CHEST - 2 VIEW COMPARISON:  10/30/2021 FINDINGS: The heart size and mediastinal contours are within normal limits. Both  lungs are clear. The visualized skeletal structures are unremarkable. IMPRESSION: No active cardiopulmonary disease. Electronically Signed   By: Mabel Converse D.O.   On: 04/26/2023 10:56   LONG TERM MONITOR (3-14 DAYS) Result Date: 04/06/2023 Cardiac monitor (Zio Patch): March 10, 2023-December 30th 2024 Dominant rhythm sinus, tachycardia burden (18%). Heart rate 46-164 bpm.  Avg HR 87 bpm. No atrial fibrillation detected during the monitoring period. No ventricular tachycardia, high grade AV block,  pauses (3 seconds or longer). Total supraventricular ectopic burden <1%. Fastest episode of SVT: 03/20/2023: 3:06 AM, 11 beats, 5.3 seconds, average HR 137 bpm, max HR 164 bpm Total ventricular ectopic burden <1%. Patient triggered events: 8.  Underlying rhythm predominantly sinus with rare supraventricular ectopic beats.     Microbiology: Results for orders placed or performed during the hospital encounter of 04/26/23  SARS Coronavirus 2 by RT PCR (hospital order, performed in [facility] hospital lab) *cepheid single result test* Anterior Nasal Swab     Status: None   Collection Time: 04/26/23 10:26 AM   Specimen: Anterior Nasal Swab  Result Value Ref Range Status   SARS Coronavirus 2 by RT PCR NEGATIVE NEGATIVE Final    Comment: (NOTE) SARS-CoV-2 target nucleic acids are NOT DETECTED.  The SARS-CoV-2 RNA is generally detectable in upper and lower respiratory specimens during the acute phase of infection. The lowest concentration of SARS-CoV-2 viral copies this assay can detect is 250 copies / mL. A negative result does not preclude SARS-CoV-2 infection and should not be used as the sole basis for treatment or other patient management decisions.  A negative result may occur with improper specimen collection / handling, submission of specimen other than nasopharyngeal swab, presence of viral mutation(s) within the areas targeted by this assay, and inadequate number of viral copies (<250 copies / mL). A negative result must be combined with clinical observations, patient history, and epidemiological information.  Fact Sheet for Patients:   roadlaptop.co.za  Fact Sheet for Healthcare Providers: http://kim-miller.com/  This test is not yet approved or  cleared by the [place]  FDA and has been authorized for detection and/or diagnosis of SARS-CoV-2 by FDA under an Emergency Use Authorization (EUA).  This EUA will remain in effect  (meaning this test can be used) for the duration of the COVID-19 declaration under Section 564(b)(1) of the Act, 21 U.S.C. section 360bbb-3(b)(1), unless the authorization is terminated or revoked sooner.  Performed at [facility], [address] [place], [place] [address]   Resp panel by RT-PCR (RSV, Flu A&B, Covid) Anterior Nasal Swab     Status: None   Collection Time: 04/26/23 10:32 AM   Specimen: Anterior Nasal Swab  Result Value Ref Range Status   SARS Coronavirus 2 by RT PCR NEGATIVE NEGATIVE Final    Comment: (NOTE) SARS-CoV-2 target nucleic acids are NOT DETECTED.  The SARS-CoV-2 RNA is generally detectable in upper respiratory specimens during the acute phase of infection. The lowest concentration of SARS-CoV-2 viral copies this assay can detect is 138 copies/mL. A negative result does not preclude SARS-Cov-2 infection and should not be used as the sole basis for treatment or other patient management decisions. A negative result may occur with  improper specimen collection/handling, submission of specimen other than nasopharyngeal swab, presence of viral mutation(s) within the areas targeted by this assay, and inadequate number of viral copies(<138 copies/mL). A negative result must be combined with clinical observations, patient history, and epidemiological information. The expected result is Negative.  Fact  Sheet for Patients:  bloggercourse.com  Fact Sheet for Healthcare Providers:  seriousbroker.it  This test is no t yet approved or cleared by the [place]  FDA and  has been authorized for detection and/or diagnosis of SARS-CoV-2 by FDA under an Emergency Use Authorization (EUA). This EUA will remain  in effect (meaning this test can be used) for the duration of the COVID-19 declaration under Section 564(b)(1) of the Act, 21 U.S.C.section 360bbb-3(b)(1), unless the authorization is terminated  or  revoked sooner.       Influenza A by PCR NEGATIVE NEGATIVE Final   Influenza B by PCR NEGATIVE NEGATIVE Final    Comment: (NOTE) The Xpert Xpress SARS-CoV-2/FLU/RSV plus assay is intended as an aid in the diagnosis of influenza from Nasopharyngeal swab specimens and should not be used as a sole basis for treatment. Nasal washings and aspirates are unacceptable for Xpert Xpress SARS-CoV-2/FLU/RSV testing.  Fact Sheet for Patients: bloggercourse.com  Fact Sheet for Healthcare Providers: seriousbroker.it  This test is not yet approved or cleared by the [place]  FDA and has been authorized for detection and/or diagnosis of SARS-CoV-2 by FDA under an Emergency Use Authorization (EUA). This EUA will remain in effect (meaning this test can be used) for the duration of the COVID-19 declaration under Section 564(b)(1) of the Act, 21 U.S.C. section 360bbb-3(b)(1), unless the authorization is terminated or revoked.     Resp Syncytial Virus by PCR NEGATIVE NEGATIVE Final    Comment: (NOTE) Fact Sheet for Patients: bloggercourse.com  Fact Sheet for Healthcare Providers: seriousbroker.it  This test is not yet approved or cleared by the [place]  FDA and has been authorized for detection and/or diagnosis of SARS-CoV-2 by FDA under an Emergency Use Authorization (EUA). This EUA will remain in effect (meaning this test can be used) for the duration of the COVID-19 declaration under Section 564(b)(1) of the Act, 21 U.S.C. section 360bbb-3(b)(1), unless the authorization is terminated or revoked.  Performed at [facility], [address] [place], [place] [address]   Gastrointestinal Panel by PCR , Stool     Status: None   Collection Time: 04/27/23 11:00 PM   Specimen: Stool  Result Value Ref Range Status   Campylobacter species NOT DETECTED NOT DETECTED Final    Plesimonas shigelloides NOT DETECTED NOT DETECTED Final   Salmonella species NOT DETECTED NOT DETECTED Final   Yersinia enterocolitica NOT DETECTED NOT DETECTED Final   Vibrio species NOT DETECTED NOT DETECTED Final   Vibrio cholerae NOT DETECTED NOT DETECTED Final   Enteroaggregative E coli (EAEC) NOT DETECTED NOT DETECTED Final   Enteropathogenic E coli (EPEC) NOT DETECTED NOT DETECTED Final   Enterotoxigenic E coli (ETEC) NOT DETECTED NOT DETECTED Final   Shiga like toxin producing E coli (STEC) NOT DETECTED NOT DETECTED Final   Shigella/Enteroinvasive E coli (EIEC) NOT DETECTED NOT DETECTED Final   Cryptosporidium NOT DETECTED NOT DETECTED Final   Cyclospora cayetanensis NOT DETECTED NOT DETECTED Final   Entamoeba histolytica NOT DETECTED NOT DETECTED Final   Giardia lamblia NOT DETECTED NOT DETECTED Final   Adenovirus F40/41 NOT DETECTED NOT DETECTED Final   Astrovirus NOT DETECTED NOT DETECTED Final   Norovirus GI/GII NOT DETECTED NOT DETECTED Final   Rotavirus A NOT DETECTED NOT DETECTED Final   Sapovirus (I, II, IV, and V) NOT DETECTED NOT DETECTED Final    Comment: Performed at [facility], [address] [place], [place] [address]  C Difficile Quick Screen w PCR reflex  Status: None   Collection Time: 04/27/23 11:00 PM   Specimen: STOOL  Result Value Ref Range Status   C Diff antigen NEGATIVE NEGATIVE Final   C Diff toxin NEGATIVE NEGATIVE Final   C Diff interpretation No C. difficile detected.  Final    Comment: Performed at [facility], [address], [place], [place] [address]    Labs: CBC: Recent Labs  Lab 04/26/23 1026 04/28/23 0533  WBC 14.7* 10.3  HGB 13.9 13.5  HCT 42.3 43.8  MCV 83.3 88.3  PLT 286 247   Basic Metabolic Panel: Recent Labs  Lab 04/26/23 1026 04/27/23 0944 04/28/23 0533 04/29/23 0832  NA 135 136 135 136  K 3.4* 3.2* 3.2* 3.6  CL 100 102 100 103  CO2 22 21* 22 24  GLUCOSE 178* 158* 162* 140*  BUN  13 15 21 19   CREATININE 1.11* 0.82 1.18* 0.91  CALCIUM  9.4 8.9 8.7* 8.8*   Liver Function Tests: Recent Labs  Lab 04/26/23 1032  AST 34  ALT 27  ALKPHOS 115  BILITOT 0.6  PROT 8.7*  ALBUMIN 4.8   CBG: No results for input(s): GLUCAP in the last 168 hours.  Discharge time spent: 33 minutes.  Signed: Concepcion Riser, MD [facility] 04/30/2023

## 2023-06-05 ENCOUNTER — Ambulatory Visit: Payer: Self-pay | Admitting: Cardiology

## 2023-07-02 DIAGNOSIS — M5416 Radiculopathy, lumbar region: Secondary | ICD-10-CM | POA: Diagnosis not present

## 2023-07-16 ENCOUNTER — Telehealth: Payer: Self-pay

## 2023-07-16 NOTE — Telephone Encounter (Signed)
 Patient calls nurse line requesting to speak with Dr. Andree Kayser.   Advised that I was one of the nurses and would like to get some more information.   Patient would not provide me with any additional information. States that she just "really needs to speak with Dr. Andree Kayser today."   Will forward to Dr. Andree Kayser.   Elsie Halo, RN

## 2023-07-18 MED ORDER — OXYCODONE-ACETAMINOPHEN 10-325 MG PO TABS: 1.0000 | ORAL_TABLET | Freq: Three times a day (TID) | ORAL | 0 refills | Status: DC

## 2023-07-18 NOTE — Telephone Encounter (Signed)
 Called pt Her current chronic pain meds she has been on long term not working as well Formulary wants to change her to a morphine  based drug and she has allergies I see no long acting oxycontin  on her formulary as she is out of meds, will send in 1 m chronic percocet. She will get a copy of her formulary and send it to me and see if we can switch to something that works better  Also pharmacist gave her pushback on opioid and benzodiazepine. I told her we should work to change or alter that combo in future.

## 2023-07-25 ENCOUNTER — Other Ambulatory Visit: Payer: Self-pay | Admitting: Family Medicine

## 2023-07-25 DIAGNOSIS — F32A Depression, unspecified: Secondary | ICD-10-CM

## 2023-07-25 DIAGNOSIS — F418 Other specified anxiety disorders: Secondary | ICD-10-CM

## 2023-08-06 ENCOUNTER — Other Ambulatory Visit: Payer: Self-pay | Admitting: Family Medicine

## 2023-08-06 DIAGNOSIS — E782 Mixed hyperlipidemia: Secondary | ICD-10-CM

## 2023-08-06 DIAGNOSIS — I10 Essential (primary) hypertension: Secondary | ICD-10-CM

## 2023-08-06 DIAGNOSIS — G4700 Insomnia, unspecified: Secondary | ICD-10-CM

## 2023-08-13 ENCOUNTER — Other Ambulatory Visit: Payer: Self-pay

## 2023-08-14 MED ORDER — OXYCODONE-ACETAMINOPHEN 10-325 MG PO TABS: 1.0000 | ORAL_TABLET | Freq: Three times a day (TID) | ORAL | 0 refills | Status: DC

## 2023-08-20 ENCOUNTER — Telehealth: Payer: Self-pay

## 2023-08-20 NOTE — Telephone Encounter (Signed)
 Patient calls nurse line regarding status of percocet prescription.   This was sent to [facility] pharmacy last week. Attempted to call and cancel rx. This was shipped out yesterday with an arrival of tomorrow between 9:20 and 1:20, therefore, I am unable to cancel.   Called patient to advise of update. Patient states that Xanax  and percocet are the only medications that are supposed to be sent to [facility] pharmacy, all other medications are to go to [facility].   Apologized for medication being sent to [facility]. Patient asked that I notate in chart that in the future these medications are to be sent to [facility] pharmacy.   Elsie Halo, RN

## 2023-09-03 ENCOUNTER — Ambulatory Visit (INDEPENDENT_AMBULATORY_CARE_PROVIDER_SITE_OTHER): Admitting: Family Medicine

## 2023-09-03 ENCOUNTER — Ambulatory Visit: Payer: Self-pay | Admitting: Family Medicine

## 2023-09-03 ENCOUNTER — Encounter: Payer: Self-pay | Admitting: Family Medicine

## 2023-09-03 VITALS — BP 118/76 | HR 80 | Ht 68.0 in | Wt 218.0 lb

## 2023-09-03 DIAGNOSIS — Z96643 Presence of artificial hip joint, bilateral: Secondary | ICD-10-CM

## 2023-09-03 DIAGNOSIS — F418 Other specified anxiety disorders: Secondary | ICD-10-CM | POA: Diagnosis not present

## 2023-09-03 DIAGNOSIS — R600 Localized edema: Secondary | ICD-10-CM | POA: Diagnosis not present

## 2023-09-03 DIAGNOSIS — R7309 Other abnormal glucose: Secondary | ICD-10-CM | POA: Diagnosis not present

## 2023-09-03 DIAGNOSIS — I10 Essential (primary) hypertension: Secondary | ICD-10-CM

## 2023-09-03 LAB — POCT GLYCOSYLATED HEMOGLOBIN (HGB A1C): HbA1c, POC (controlled diabetic range): 6.7 % (ref 0.0–7.0)

## 2023-09-04 MED ORDER — FUROSEMIDE 20 MG PO TABS: ORAL_TABLET | ORAL | Status: DC

## 2023-09-04 NOTE — Progress Notes (Signed)
    CHIEF COMPLAINT / HPI: #1.  Lower extremity edema is better but still not totally gone.  Wonders if she needs to increase her diuretic. 2.  Follow-up recent right hip replacement.  She was doing pretty well but got down on her hands and knees to get a pair shoes from out of the bed and felt a pop when she came up.  Since then it has been very sore on that side.  She did see the orthopedist who x-rayed it.  Reportedly implant was still intact. 3.  Home stressors: Worrying a lot about her sister, about their current living situation which is not in a safe neighborhood.   PERTINENT  PMH / PSH: I have reviewed the patient's medications, allergies, past medical and surgical history, smoking status and updated in the EMR as appropriate.   OBJECTIVE:  BP 118/76   Pulse 80   Ht 5' 8 (1.727 m)   Wt 218 lb (98.9 kg)   LMP 09/20/2011   SpO2 95%   BMI 33.15 kg/m  Vital signs reviewed. GENERAL: Well-developed, well-nourished, no acute distress. NEURO: No gross focal neurological deficits. MSK: Movement of extremity x 4. PSYCH: AxOx4. Good eye contact.. No psychomotor retardation or agitation. Appropriate speech fluency and content. Asks and answers questions appropriately. Mood is congruent.    ASSESSMENT / PLAN:   Anxiety with depression Long discussion today about her current stressors.  She would like to get back into counseling.  We discussed options of ways to get this set up.  She will first call her insurance company and if that does not result in a new therapist for her, she will let me know and I will place a referral for [facility] to see if they can help her find a therapist.  Total of 30 minutes spent face-to-face.  Hx of bilateral hip replacements Discussed things she should not do such as crawl out of the bed to get a pair shoes.  Her hip pain seems to be getting better and orthopedics has evaluated.  She will let me know if he gets worse.  Bilateral lower extremity edema Will  increase her furosemide  to 40 alternating with 20.  She will let me know in a couple weeks if this is improving.  When I see her back we will check some lab work.  No phlebotomist available today.   Shannon Grice MD

## 2023-09-04 NOTE — Assessment & Plan Note (Signed)
 Discussed things she should not do such as crawl out of the bed to get a pair shoes.  Her hip pain seems to be getting better and orthopedics has evaluated.  She will let me know if he gets worse.

## 2023-09-04 NOTE — Assessment & Plan Note (Signed)
 Will increase her furosemide  to 40 alternating with 20.  She will let me know in a couple weeks if this is improving.  When I see her back we will check some lab work.  No phlebotomist available today.

## 2023-09-04 NOTE — Addendum Note (Signed)
 Addended byVioletta Grice L on: 09/04/2023 11:32 AM   Modules accepted: Orders

## 2023-09-04 NOTE — Assessment & Plan Note (Signed)
 Long discussion today about her current stressors.  She would like to get back into counseling.  We discussed options of ways to get this set up.  She will first call her insurance company and if that does not result in a new therapist for her, she will let me know and I will place a referral for [facility] to see if they can help her find a therapist.  Total of 30 minutes spent face-to-face.

## 2023-09-15 ENCOUNTER — Other Ambulatory Visit: Payer: Self-pay

## 2023-09-16 MED ORDER — OXYCODONE-ACETAMINOPHEN 10-325 MG PO TABS: 1.0000 | ORAL_TABLET | Freq: Three times a day (TID) | ORAL | 0 refills | Status: DC

## 2023-09-19 ENCOUNTER — Telehealth: Payer: Self-pay

## 2023-09-19 MED ORDER — OXYCODONE-ACETAMINOPHEN 10-325 MG PO TABS: 1.0000 | ORAL_TABLET | Freq: Three times a day (TID) | ORAL | 0 refills | Status: DC

## 2023-09-19 NOTE — Telephone Encounter (Signed)
 Patient calls nurse line in regards to pain medication prescription.  Prescription was sent to her mail order pharmacy, patient does not use this anymore.   I called the mail order pharmacy and cancelled this out.   Please resend to [employer].   I have removed mail order pharmacies from her list.

## 2023-10-08 ENCOUNTER — Ambulatory Visit (INDEPENDENT_AMBULATORY_CARE_PROVIDER_SITE_OTHER): Admitting: Family Medicine

## 2023-10-08 ENCOUNTER — Encounter: Payer: Self-pay | Admitting: Family Medicine

## 2023-10-08 VITALS — BP 117/79 | HR 70 | Wt 221.0 lb

## 2023-10-08 DIAGNOSIS — E66812 Obesity, class 2: Secondary | ICD-10-CM

## 2023-10-08 DIAGNOSIS — F418 Other specified anxiety disorders: Secondary | ICD-10-CM

## 2023-10-08 DIAGNOSIS — R7303 Prediabetes: Secondary | ICD-10-CM

## 2023-10-08 DIAGNOSIS — R600 Localized edema: Secondary | ICD-10-CM | POA: Diagnosis not present

## 2023-10-08 DIAGNOSIS — E782 Mixed hyperlipidemia: Secondary | ICD-10-CM | POA: Diagnosis not present

## 2023-10-08 NOTE — Progress Notes (Unsigned)
    CHIEF COMPLAINT / HPI: #1.  Follow-up lower extremity edema: With the increased dose of Lasix  since significantly improved.  She does feel like she is carrying excess water still in her trunk but her lower extremities are much better. 2.  Weight gain: She has had hip replacement recently and her activity level was lower for the last few months.  Feels like she has not been eating any more but she is unhappy with weight gain.  She walks about 1 to 1-1/2 miles a day.  She is trying to follow a pretty good diet. 3.  Stressors: A very good friend of hers recently died.  Oneita is this weekend.  Her dog is also declining in health.  Both these things are really weighing on her mind. 4.   PERTINENT  PMH / PSH: I have reviewed the patient's medications, allergies, past medical and surgical history, smoking status and updated in the EMR as appropriate.   OBJECTIVE:  BP 117/79   Pulse 70   Wt 221 lb (100.2 kg)   LMP 09/20/2011   SpO2 95%   BMI 33.60 kg/m  Vital signs reviewed. GENERAL: Well-developed, well-nourished, no acute distress. MSK: Movement of extremity x 4.  Trace nonpitting edema bilateral lower extremities to ankle CV: Regular rate and rhythm no murmur VASCULAR: Dorsalis pedis pulses 2+ bilaterally symmetrical.  Normal cap refill.   ASSESSMENT / PLAN:   Obesity, Class II, BMI 35-39.9 Discussed weight gain.  Recommended increase her walking up to 3 miles a day if she can tolerate that with her hip.  Continue dietary restrictions that she is already instituted.  Will follow-up in 1 month.  Bilateral lower extremity edema Significantly improved on this dose of Lasix  so we will continue that.  Will check some labs today to look at creatinine and potassium.  Follow-up 1 month  Anxiety with depression Symptoms aggravated by recent events.  Situationally she is responding appropriately.  We discussed.  She is currently in CBT   Camie Mulch MD

## 2023-10-08 NOTE — Patient Instructions (Signed)
 Let me see you in about 2 months. I will send you a note about your labs,

## 2023-10-09 LAB — COMPREHENSIVE METABOLIC PANEL WITH GFR
ALT: 22 IU/L (ref 0–32)
AST: 22 IU/L (ref 0–40)
Albumin: 4.5 g/dL (ref 3.9–4.9)
Alkaline Phosphatase: 112 IU/L (ref 44–121)
BUN/Creatinine Ratio: 17 (ref 12–28)
BUN: 18 mg/dL (ref 8–27)
Bilirubin Total: 0.2 mg/dL (ref 0.0–1.2)
CO2: 18 mmol/L — ABNORMAL LOW (ref 20–29)
Calcium: 9.1 mg/dL (ref 8.7–10.3)
Chloride: 102 mmol/L (ref 96–106)
Creatinine, Ser: 1.07 mg/dL — ABNORMAL HIGH (ref 0.57–1.00)
Globulin, Total: 2 g/dL (ref 1.5–4.5)
Glucose: 129 mg/dL — ABNORMAL HIGH (ref 70–99)
Potassium: 4.5 mmol/L (ref 3.5–5.2)
Sodium: 139 mmol/L (ref 134–144)
Total Protein: 6.5 g/dL (ref 6.0–8.5)
eGFR: 58 mL/min/1.73 — ABNORMAL LOW (ref >59)

## 2023-10-09 LAB — LIPID PANEL
Chol/HDL Ratio: 4.3 ratio (ref 0.0–4.4)
Cholesterol, Total: 150 mg/dL (ref 100–199)
HDL: 35 mg/dL — ABNORMAL LOW (ref >39)
LDL Chol Calc (NIH): 75 mg/dL (ref 0–99)
Triglycerides: 244 mg/dL — ABNORMAL HIGH (ref 0–149)
VLDL Cholesterol Cal: 40 mg/dL (ref 5–40)

## 2023-10-09 LAB — TSH: TSH: 1.92 u[IU]/mL (ref 0.450–4.500)

## 2023-10-09 NOTE — Assessment & Plan Note (Signed)
 Discussed weight gain.  Recommended increase her walking up to 3 miles a day if she can tolerate that with her hip.  Continue dietary restrictions that she is already instituted.  Will follow-up in 1 month.

## 2023-10-09 NOTE — Assessment & Plan Note (Signed)
 Symptoms aggravated by recent events.  Situationally she is responding appropriately.  We discussed.  She is currently in CBT

## 2023-10-09 NOTE — Assessment & Plan Note (Signed)
 Significantly improved on this dose of Lasix  so we will continue that.  Will check some labs today to look at creatinine and potassium.  Follow-up 1 month

## 2023-10-10 ENCOUNTER — Other Ambulatory Visit: Payer: Self-pay | Admitting: Family Medicine

## 2023-10-10 DIAGNOSIS — Z0000 Encounter for general adult medical examination without abnormal findings: Secondary | ICD-10-CM

## 2023-10-10 DIAGNOSIS — M5416 Radiculopathy, lumbar region: Secondary | ICD-10-CM | POA: Diagnosis not present

## 2023-10-13 ENCOUNTER — Ambulatory Visit: Payer: Self-pay | Admitting: Family Medicine

## 2023-10-14 ENCOUNTER — Other Ambulatory Visit: Payer: Self-pay

## 2023-10-15 ENCOUNTER — Other Ambulatory Visit: Payer: Self-pay

## 2023-10-15 MED ORDER — OXYCODONE-ACETAMINOPHEN 10-325 MG PO TABS: 1.0000 | ORAL_TABLET | Freq: Three times a day (TID) | ORAL | 0 refills | Status: DC

## 2023-10-15 MED ORDER — FUROSEMIDE 20 MG PO TABS: ORAL_TABLET | ORAL | 1 refills | Status: DC

## 2023-10-21 ENCOUNTER — Ambulatory Visit

## 2023-10-30 ENCOUNTER — Telehealth: Payer: Self-pay

## 2023-10-30 ENCOUNTER — Ambulatory Visit
Admission: RE | Admit: 2023-10-30 | Discharge: 2023-10-30 | Disposition: A | Discharge status: Home / Self Care | Source: Ambulatory Visit | Attending: Family Medicine | Admitting: Family Medicine

## 2023-10-30 DIAGNOSIS — Z0000 Encounter for general adult medical examination without abnormal findings: Secondary | ICD-10-CM

## 2023-10-30 NOTE — Telephone Encounter (Signed)
 Patient calls nurse line reporting difficulty with [facility].   She reports she no longer wishes to use [facility].   She reports she is needing a small supply of Metoprolol  and Lasix  sent to her local pharmacy until she can reestablish with [facility].   I called [facility] and cancelled prescriptions.   Please send a 30 day supply to [facility].   Will forward to PCP.

## 2023-10-31 ENCOUNTER — Other Ambulatory Visit: Payer: Self-pay | Admitting: Family Medicine

## 2023-10-31 DIAGNOSIS — N63 Unspecified lump in unspecified breast: Secondary | ICD-10-CM

## 2023-10-31 MED ORDER — FUROSEMIDE 20 MG PO TABS: ORAL_TABLET | ORAL | 1 refills | Status: DC

## 2023-10-31 MED ORDER — METOPROLOL TARTRATE 25 MG PO TABS: 25.0000 mg | ORAL_TABLET | Freq: Two times a day (BID) | ORAL | 0 refills | Status: DC

## 2023-11-06 DIAGNOSIS — M5416 Radiculopathy, lumbar region: Secondary | ICD-10-CM | POA: Diagnosis not present

## 2023-11-13 ENCOUNTER — Other Ambulatory Visit: Payer: Self-pay | Admitting: Family Medicine

## 2023-11-14 ENCOUNTER — Other Ambulatory Visit: Payer: Self-pay | Admitting: Family Medicine

## 2023-11-14 ENCOUNTER — Ambulatory Visit
Admission: RE | Admit: 2023-11-14 | Discharge: 2023-11-14 | Disposition: A | Discharge status: Home / Self Care | Source: Ambulatory Visit | Attending: Family Medicine | Admitting: Family Medicine

## 2023-11-14 DIAGNOSIS — N6325 Unspecified lump in the left breast, overlapping quadrants: Secondary | ICD-10-CM | POA: Diagnosis not present

## 2023-11-14 DIAGNOSIS — N63 Unspecified lump in unspecified breast: Secondary | ICD-10-CM

## 2023-11-14 DIAGNOSIS — R928 Other abnormal and inconclusive findings on diagnostic imaging of breast: Secondary | ICD-10-CM | POA: Diagnosis not present

## 2023-11-14 DIAGNOSIS — N6310 Unspecified lump in the right breast, unspecified quadrant: Secondary | ICD-10-CM

## 2023-11-14 DIAGNOSIS — N6313 Unspecified lump in the right breast, lower outer quadrant: Secondary | ICD-10-CM | POA: Diagnosis not present

## 2023-11-17 ENCOUNTER — Other Ambulatory Visit: Payer: Self-pay

## 2023-11-17 MED ORDER — OXYCODONE-ACETAMINOPHEN 10-325 MG PO TABS: 1.0000 | ORAL_TABLET | Freq: Three times a day (TID) | ORAL | 0 refills | Status: DC

## 2023-11-21 ENCOUNTER — Other Ambulatory Visit: Payer: Self-pay | Admitting: Family Medicine

## 2023-11-27 ENCOUNTER — Other Ambulatory Visit: Payer: Self-pay | Admitting: Family Medicine

## 2023-11-27 DIAGNOSIS — F32A Depression, unspecified: Secondary | ICD-10-CM

## 2023-11-27 DIAGNOSIS — F418 Other specified anxiety disorders: Secondary | ICD-10-CM

## 2023-12-03 DIAGNOSIS — Z6832 Body mass index (BMI) 32.0-32.9, adult: Secondary | ICD-10-CM | POA: Diagnosis not present

## 2023-12-03 DIAGNOSIS — D329 Benign neoplasm of meninges, unspecified: Secondary | ICD-10-CM | POA: Diagnosis not present

## 2023-12-04 ENCOUNTER — Encounter: Payer: Self-pay | Admitting: Neurological Surgery

## 2023-12-04 ENCOUNTER — Other Ambulatory Visit: Payer: Self-pay | Admitting: Neurological Surgery

## 2023-12-04 DIAGNOSIS — D329 Benign neoplasm of meninges, unspecified: Secondary | ICD-10-CM

## 2023-12-08 ENCOUNTER — Encounter: Payer: Self-pay | Admitting: Family Medicine

## 2023-12-08 DIAGNOSIS — D3160 Benign neoplasm of unspecified site of unspecified orbit: Secondary | ICD-10-CM | POA: Insufficient documentation

## 2023-12-17 ENCOUNTER — Other Ambulatory Visit: Payer: Self-pay

## 2023-12-18 MED ORDER — OXYCODONE-ACETAMINOPHEN 10-325 MG PO TABS: 1.0000 | ORAL_TABLET | Freq: Three times a day (TID) | ORAL | 0 refills | Status: DC

## 2023-12-22 DIAGNOSIS — I499 Cardiac arrhythmia, unspecified: Secondary | ICD-10-CM | POA: Diagnosis not present

## 2023-12-22 DIAGNOSIS — I739 Peripheral vascular disease, unspecified: Secondary | ICD-10-CM | POA: Diagnosis not present

## 2023-12-22 DIAGNOSIS — J309 Allergic rhinitis, unspecified: Secondary | ICD-10-CM | POA: Diagnosis not present

## 2023-12-22 DIAGNOSIS — I130 Hypertensive heart and chronic kidney disease with heart failure and stage 1 through stage 4 chronic kidney disease, or unspecified chronic kidney disease: Secondary | ICD-10-CM | POA: Diagnosis not present

## 2023-12-22 DIAGNOSIS — E785 Hyperlipidemia, unspecified: Secondary | ICD-10-CM | POA: Diagnosis not present

## 2023-12-22 DIAGNOSIS — M62838 Other muscle spasm: Secondary | ICD-10-CM | POA: Diagnosis not present

## 2023-12-22 DIAGNOSIS — Z885 Allergy status to narcotic agent status: Secondary | ICD-10-CM | POA: Diagnosis not present

## 2023-12-22 DIAGNOSIS — D329 Benign neoplasm of meninges, unspecified: Secondary | ICD-10-CM | POA: Diagnosis not present

## 2023-12-22 DIAGNOSIS — K219 Gastro-esophageal reflux disease without esophagitis: Secondary | ICD-10-CM | POA: Diagnosis not present

## 2023-12-22 DIAGNOSIS — I509 Heart failure, unspecified: Secondary | ICD-10-CM | POA: Diagnosis not present

## 2023-12-22 DIAGNOSIS — K760 Fatty (change of) liver, not elsewhere classified: Secondary | ICD-10-CM | POA: Diagnosis not present

## 2023-12-22 DIAGNOSIS — F1120 Opioid dependence, uncomplicated: Secondary | ICD-10-CM | POA: Diagnosis not present

## 2023-12-22 DIAGNOSIS — Z888 Allergy status to other drugs, medicaments and biological substances status: Secondary | ICD-10-CM | POA: Diagnosis not present

## 2023-12-22 DIAGNOSIS — R7303 Prediabetes: Secondary | ICD-10-CM | POA: Diagnosis not present

## 2023-12-22 DIAGNOSIS — N1831 Chronic kidney disease, stage 3a: Secondary | ICD-10-CM | POA: Diagnosis not present

## 2023-12-22 DIAGNOSIS — D352 Benign neoplasm of pituitary gland: Secondary | ICD-10-CM | POA: Diagnosis not present

## 2023-12-22 DIAGNOSIS — F329 Major depressive disorder, single episode, unspecified: Secondary | ICD-10-CM | POA: Diagnosis not present

## 2023-12-22 DIAGNOSIS — R2681 Unsteadiness on feet: Secondary | ICD-10-CM | POA: Diagnosis not present

## 2023-12-22 DIAGNOSIS — I25119 Atherosclerotic heart disease of native coronary artery with unspecified angina pectoris: Secondary | ICD-10-CM | POA: Diagnosis not present

## 2023-12-22 DIAGNOSIS — E049 Nontoxic goiter, unspecified: Secondary | ICD-10-CM | POA: Diagnosis not present

## 2023-12-22 DIAGNOSIS — F419 Anxiety disorder, unspecified: Secondary | ICD-10-CM | POA: Diagnosis not present

## 2023-12-22 DIAGNOSIS — G4700 Insomnia, unspecified: Secondary | ICD-10-CM | POA: Diagnosis not present

## 2023-12-22 DIAGNOSIS — G43909 Migraine, unspecified, not intractable, without status migrainosus: Secondary | ICD-10-CM | POA: Diagnosis not present

## 2023-12-22 DIAGNOSIS — M1990 Unspecified osteoarthritis, unspecified site: Secondary | ICD-10-CM | POA: Diagnosis not present

## 2023-12-22 DIAGNOSIS — K5900 Constipation, unspecified: Secondary | ICD-10-CM | POA: Diagnosis not present

## 2023-12-22 DIAGNOSIS — M5440 Lumbago with sciatica, unspecified side: Secondary | ICD-10-CM | POA: Diagnosis not present

## 2023-12-22 DIAGNOSIS — G4730 Sleep apnea, unspecified: Secondary | ICD-10-CM | POA: Diagnosis not present

## 2023-12-22 DIAGNOSIS — F1220 Cannabis dependence, uncomplicated: Secondary | ICD-10-CM | POA: Diagnosis not present

## 2023-12-24 ENCOUNTER — Ambulatory Visit (INDEPENDENT_AMBULATORY_CARE_PROVIDER_SITE_OTHER): Admitting: Family Medicine

## 2023-12-24 VITALS — BP 121/102 | HR 86 | Ht 68.0 in | Wt 217.4 lb

## 2023-12-24 DIAGNOSIS — H9192 Unspecified hearing loss, left ear: Secondary | ICD-10-CM

## 2023-12-24 DIAGNOSIS — R600 Localized edema: Secondary | ICD-10-CM

## 2023-12-24 DIAGNOSIS — F418 Other specified anxiety disorders: Secondary | ICD-10-CM | POA: Diagnosis not present

## 2023-12-24 DIAGNOSIS — D3160 Benign neoplasm of unspecified site of unspecified orbit: Secondary | ICD-10-CM | POA: Diagnosis not present

## 2023-12-25 ENCOUNTER — Ambulatory Visit (INDEPENDENT_AMBULATORY_CARE_PROVIDER_SITE_OTHER): Admitting: Family Medicine

## 2023-12-25 VITALS — BP 136/88 | HR 95 | Ht 68.0 in | Wt 215.0 lb

## 2023-12-25 DIAGNOSIS — N6082 Other benign mammary dysplasias of left breast: Secondary | ICD-10-CM | POA: Diagnosis not present

## 2023-12-25 MED ORDER — CEPHALEXIN 500 MG PO CAPS: 500.0000 mg | ORAL_CAPSULE | Freq: Three times a day (TID) | ORAL | 0 refills | Status: DC

## 2023-12-25 NOTE — Patient Instructions (Signed)
 You have had incision and drainage of your sebaceous cyst today.  As we discussed, I do not think I was able to get all the pieces of the sebaceous cyst sac out so this may come back.  It was pretty extensive underneath the skin so I am putting you on antibiotics for 5 days.  Keep the area dry for the first 12 hours and then after that when you take a shower or get it wet, use some alcohol on top of it and do this for 5 days.  If it gets really swollen or starts draining or you get worried about it, please call the office immediately.  I am placing you on antibiotics for 5 days.  Let me know in a week how this is doing, okay!? Great to see you!

## 2023-12-25 NOTE — Progress Notes (Signed)
   Discussed the use of AI scribe software for clinical note transcription with the patient, who gave verbal consent to proceed.  History of Present Illness   Shannon Stuart is a 63 year old female who presents with worsening chest pain and concerns about a breast mass.  Chest pain - Tight, squeezing sensation localized to the left chest area - Occurs three to four days per week - Cardiology evaluation in January with prescription for metoprolol  - Cardiac CT scan showed mildly leaky tricuspid valve and normal ejection fraction of 60% - Family history of heart disease: two sisters with congestive heart failure  Breast masses - Recent mammogram revealed a mildly complicated cyst in the left breast superficial tissue, c/w sebaceous cyst - Circumscribed, hypoechoic mass in the right breast - Scheduled for follow-up imaging in six months  Cutaneous cyst - Sebaceous cyst with increasing erythema - Cyst fills with material and produces an unpleasant odor  Headaches and visual disturbances - Frequent headaches - Vision changes, blurry - Photophobia requiring use of sunglasses indoors  Anxiety - Takes Xanax  for anxiety and does not seem to be working as well as previously. Would like to be off all of this kind of medicine - Seeking counseling support  Diuretic use and weight concerns - Alternates between 20 mg and 40 mg of diuretic - Perceives diuretic as somewhat beneficial   Weight Unhappy with current weight - No weight loss despite decreased oral intake        PERTINENT  PMH / PSH: I have reviewed the patient's medications, allergies, past medical and surgical history, smoking status.  Pertinent findings that relate to today's visit / issues include:   Physical Exam     Vital signs reviewed GENERALl: Well developed, well nourished, in no acute distress. CV: Regular rate and rhythm,   MSK: MOE x 4. Normal muscle strength, bulk and tone. SKIN : slightly firm area  around clogged skin pore located at inner upper edge of breat (c/w sebaceous cyst) small amount of drainage, thin greenish white         Assessment and Plan       1. Intermittent chest discomfort, likely related to anxiety. Reviewed normal cardiac workup in January (normal ECHO, cardiac CT, ZIO patch). Discussed ways to access counseling resources. F/u 6 weeks 2. Sebaceous cyst left inner upper breat area. Scheduled for incision and drainage tomorrow 3. Headaches and blurred vision. 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 We discussed this as stressor contributing to anxiety. She is being followed by [facility] and has upcoming MRI 4. Weight concerns: reviewed healthy eating especially in times of stress. Increasing activity. 5. Acute onset left sided decrease in hearing a few months ago and it has not improved. Will send referral for [facility]

## 2023-12-26 NOTE — Progress Notes (Signed)
    CHIEF COMPLAINT / HPI: Here for management of suspected infected sebaceous cyst.  See office note from yesterday.    OBJECTIVE:  BP 136/88   Pulse 95   Ht 5' 8 (1.727 m)   Wt 215 lb (97.5 kg)   LMP 09/20/2011   SpO2 97%   BMI 32.69 kg/m  GENERAL: Well-developed no acute distress skin: At the inner top border of breast tissue on the left in the subcutaneous tissue there is some induration and slight erythema surrounding apparent clogged pore.  Patient given informed consent, signed copy in the chart. Appropriate time out taken. Area prepped and draped in usual sterile fashion. A 4 mm punch incision was made over the center of the affected area.  The sebaceous cyst was identified and both blunt  Dissection and small amounts of sharp dissection were used to remove the cyst and as much of the sac as possible.  Hemostasis obtained with pressure.  Less than 1 cc blood loss.   A small amount of antibiotic  Ointment was applied and a bandage. Post procedure instructions were given, Par\tient tolerated the procedure well.      ASSESSMENT / PLAN: Chronically inflamed/infected sebaceous cyst.  Removed as much of the sac as we could identify.  I do not think we were able to remove it also I discussed with the patient that this may recur.  Due to the issues she has been having with it in the procedure with blunt dissection today I will place her on 5 days of antibiotic.  She will let me know if it does not heal as we have discussed or if she has any new or worsening symptoms.  No problem-specific Assessment & Plan notes found for this encounter.   Camie Mulch MD

## 2023-12-27 ENCOUNTER — Ambulatory Visit
Admission: RE | Admit: 2023-12-27 | Discharge: 2023-12-27 | Disposition: A | Discharge status: Home / Self Care | Source: Ambulatory Visit | Attending: Neurological Surgery | Admitting: Neurological Surgery

## 2023-12-27 DIAGNOSIS — R519 Headache, unspecified: Secondary | ICD-10-CM | POA: Diagnosis not present

## 2023-12-27 DIAGNOSIS — D329 Benign neoplasm of meninges, unspecified: Secondary | ICD-10-CM

## 2023-12-27 DIAGNOSIS — D420 Neoplasm of uncertain behavior of cerebral meninges: Secondary | ICD-10-CM | POA: Diagnosis not present

## 2023-12-27 MED ORDER — GADOPICLENOL 0.5 MMOL/ML IV SOLN
10.0000 mL | Freq: Once | INTRAVENOUS | Status: AC | PRN
Start: 2023-12-27 — End: 2023-12-27
  Administered 2023-12-27: 10 mL via INTRAVENOUS

## 2023-12-31 ENCOUNTER — Other Ambulatory Visit: Payer: Self-pay

## 2024-01-01 MED ORDER — FUROSEMIDE 20 MG PO TABS: ORAL_TABLET | ORAL | 1 refills | Status: DC

## 2024-01-02 ENCOUNTER — Telehealth: Payer: Self-pay

## 2024-01-02 DIAGNOSIS — D329 Benign neoplasm of meninges, unspecified: Secondary | ICD-10-CM | POA: Diagnosis not present

## 2024-01-02 NOTE — Telephone Encounter (Signed)
 Patient calls nurse line to give update.   She reports she completed the antibiotic for sebaceous cyst yesterday.   She reports she feels the area has almost completely healed. She reported some drainage earlier this week, however this has resolved. She reports a little pain only when she messes with it.  She denies any fevers, chills, malodorous odors, redness or swelling.   Advised to call back for an apt if symptoms return.

## 2024-01-05 ENCOUNTER — Other Ambulatory Visit: Payer: Self-pay | Admitting: Family Medicine

## 2024-01-06 ENCOUNTER — Other Ambulatory Visit: Payer: Self-pay

## 2024-01-06 DIAGNOSIS — I10 Essential (primary) hypertension: Secondary | ICD-10-CM

## 2024-01-08 MED ORDER — AMLODIPINE BESYLATE 5 MG PO TABS: ORAL_TABLET | ORAL | 3 refills | Status: AC

## 2024-01-12 ENCOUNTER — Telehealth: Payer: Self-pay

## 2024-01-12 NOTE — Telephone Encounter (Signed)
 Patient calls nurse line regarding recurrent cyst between breasts.   Patient was seen in the office on 12/25/23 for removal.   She states that on Friday she noticed area starting to become irritated again. She endorses redness between breast and odor. Denies drainage, fever or chills.   She was asking if Dr. Rosalynn could work her in this week for evaluation.   Advised that PCP has availability on 10/29. We went ahead and scheduled for this date to hold the slot. Advised patient that I would reach out to Dr. Rosalynn regarding potential work in for this week.   Please advise.   Chiquita JAYSON English, RN

## 2024-01-13 ENCOUNTER — Other Ambulatory Visit: Payer: Self-pay

## 2024-01-13 MED ORDER — FUROSEMIDE 20 MG PO TABS: ORAL_TABLET | ORAL | 1 refills | Status: DC

## 2024-01-14 ENCOUNTER — Other Ambulatory Visit: Payer: Self-pay

## 2024-01-14 MED ORDER — OXYCODONE-ACETAMINOPHEN 10-325 MG PO TABS: 1.0000 | ORAL_TABLET | Freq: Three times a day (TID) | ORAL | 0 refills | Status: DC

## 2024-01-14 NOTE — Telephone Encounter (Signed)
 I can see her at [facility] on Friday if she wants. Just let me know and I will contact Melanie to have her scheduled OR you can schedule her on that clinic if you have access THANKS! SABRAme

## 2024-01-16 ENCOUNTER — Other Ambulatory Visit: Payer: Self-pay | Admitting: Family Medicine

## 2024-01-16 ENCOUNTER — Ambulatory Visit: Admitting: Family Medicine

## 2024-01-16 VITALS — BP 138/88 | Ht 68.0 in | Wt 215.0 lb

## 2024-01-16 DIAGNOSIS — M25562 Pain in left knee: Secondary | ICD-10-CM

## 2024-01-16 DIAGNOSIS — G8929 Other chronic pain: Secondary | ICD-10-CM

## 2024-01-16 DIAGNOSIS — L723 Sebaceous cyst: Secondary | ICD-10-CM | POA: Diagnosis not present

## 2024-01-16 DIAGNOSIS — B379 Candidiasis, unspecified: Secondary | ICD-10-CM | POA: Diagnosis not present

## 2024-01-16 DIAGNOSIS — M25561 Pain in right knee: Secondary | ICD-10-CM | POA: Diagnosis not present

## 2024-01-16 DIAGNOSIS — M2240 Chondromalacia patellae, unspecified knee: Secondary | ICD-10-CM

## 2024-01-16 MED ORDER — CLOTRIMAZOLE-BETAMETHASONE 1-0.05 % EX CREA: 1.0000 | TOPICAL_CREAM | Freq: Two times a day (BID) | CUTANEOUS | 0 refills | Status: DC

## 2024-01-16 MED ORDER — METHYLPREDNISOLONE ACETATE 40 MG/ML IJ SUSP
40.0000 mg | Freq: Once | INTRAMUSCULAR | Status: AC
  Administered 2024-01-16: 40 mg via INTRA_ARTICULAR

## 2024-01-16 MED ORDER — CEPHALEXIN 500 MG PO CAPS: 500.0000 mg | ORAL_CAPSULE | Freq: Two times a day (BID) | ORAL | 0 refills | Status: DC

## 2024-01-17 ENCOUNTER — Encounter: Payer: Self-pay | Admitting: Family Medicine

## 2024-01-17 DIAGNOSIS — M2240 Chondromalacia patellae, unspecified knee: Secondary | ICD-10-CM | POA: Insufficient documentation

## 2024-01-17 NOTE — Progress Notes (Signed)
 Shannon Stuart - 63 y.o. female MRN [id number]  Date of birth: 25-Dec-1960    SUBJECTIVE:      Chief Complaint:/ HPI:  Increased bilateral knee pain.  Wants to discuss possibly moving forward with gel shots.  Has had corticosteroid injections years ago that seem to help but she is intrigued by the new gel shots.  Knee pain is constant, 4 out of 5 ache even at rest.  Climbing stairs and walking make it worse.  Has used various over-the-counter, ice, heat, rest with limited relief.  Was told years ago that she had pretty significant arthritis in her knees. #2.  Starting to experience redness in the skin between her breasts with area of prior sebaceous cyst returning, oozing greenish fluid with pressure.  Fluid has some smell to it.  Recently had exploration and removal of sebaceous cyst in this area.  Notices that the 2 seem to come together, but red area between her breasts and the fluid in the sebaceous cyst that oozes out.  The rash precedes it.  PERTINENT  PMH / PSH: I have reviewed the patient's medications, allergies, past medical and surgical history, smoking status.  Pertinent findings that relate to today's visit / issues include: Status post bilateral hip replacements with Dr. Loa.  We deftly want to go back to him she had to have knee replacements. 2.  Has upcoming surgical intervention/craniotomy for meningioma removal. 3.  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  OBJECTIVE: BP 138/88   Ht 5' 8 (1.727 m)   Wt 215 lb (97.5 kg)   LMP 09/20/2011   BMI 32.69 kg/m   Physical Exam:   Vital signs are reviewed. GENERAL: Well-developed no acute distress SKIN: Proximal point of breast cleavage has some increased erythema, no satellite lesions.   Nontender.  Extends about 3 inches distally.  Does not extend over the breasts.   Area of prior sebaceous cyst removal reveals small  open pore with small amount of grayish-white fluid that can be expressed with pressure. KNEES: Full range of motion.  Positive crepitus bilaterally with extension.  Medial joint line tenderness bilaterally.  She also has small area of fluid collection anterior-laterally on each knee that is nontender.  Popliteal space is benign. N/V intact distally  PROCEDURE: INJECTION: Patient was given informed consent, signed copy in the chart. Appropriate time out was taken. Area prepped and draped in usual sterile fashion. Ethyl chloride was  used for local anesthesia. A 21 gauge 1 1/2 inch needle was used..  1 cc of methylprednisolone  40 mg/ml plus 4 cc of 1% lidocaine  without epinephrine  was injected into the bilateral knees using a(n) anterior medial approach.   The patient tolerated the procedure well. There were no complications. Post procedure instructions were given.   ASSESSMENT & PLAN:  #1.  Skin rash: Seems candidal and we will treat with topical clotrimazole for 7 days.  2.  Recurrence of sebaceous cyst: I suspect she has multiple chronically inflamed (and possibly linked) sebaceous glands in this area.  - This is close to or at the site of prior removal so I am not if it is a recurrence or additional sebaceous cyst.   - Does look inflamed so we will treat now with Keflex twice daily 500 mg, 7 days.   -With  this sudden recurrence post removal, would consider dermatological referral;  she is okay with this so I will place that referral.  She is aware that it may be several months before she can get in with them.  3.  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   - Has been quite a while since she has had a corticosteroid injection so I think we would start there is  that the best way to get prior authorization for consideration of gel shots. - She is aware that getting corticosteroid injections in the knees today would potentially require delay.  Before any surgical intervention but she also has upcoming craniotomy so she would not likely be getting knee surgery in the next 3 to 4 months anyway.  - She is amenable to this plan.  Corticosteroid injections today.  Urged her to go ahead and get an appointment with her orthopedist as I suspect she is headed towards total knee replacements.  Started to get x-rays but the last ones were bone-on-bone so I do not know that that would change management and I am sure her orthopedist will want to get his own views.    F/u:She will let me know if she has no improvement in these conditions within the next week.  Referral No problem-specific Assessment & Plan notes found for this encounter.

## 2024-01-21 ENCOUNTER — Ambulatory Visit: Admitting: Family Medicine

## 2024-01-26 ENCOUNTER — Telehealth: Payer: Self-pay

## 2024-01-26 NOTE — Telephone Encounter (Signed)
 Completed form for second time and attaching same letter (in chart) and faxing again

## 2024-01-26 NOTE — Telephone Encounter (Signed)
 Patient calls nurse line regarding issues with surgical clearance for [facility].   She states that she has spoken to Dr. Rosalynn about this and that we have completed this, however, Neurosurgeon office has not received.   Called and spoke with Izetta [phone]: [phone] at [facility]. She reports that they have not received and is asking if we can send completed clearance again.   Received email with form. Form printed and placed in PCP box for completion. They cannot schedule surgery until clearance is completed.   Chiquita JAYSON English, RN

## 2024-01-27 ENCOUNTER — Other Ambulatory Visit: Payer: Self-pay | Admitting: Neurological Surgery

## 2024-02-06 ENCOUNTER — Institutional Professional Consult (permissible substitution) (INDEPENDENT_AMBULATORY_CARE_PROVIDER_SITE_OTHER): Admitting: Physician Assistant

## 2024-02-10 ENCOUNTER — Encounter (INDEPENDENT_AMBULATORY_CARE_PROVIDER_SITE_OTHER): Payer: Self-pay | Admitting: Physician Assistant

## 2024-02-10 ENCOUNTER — Ambulatory Visit (INDEPENDENT_AMBULATORY_CARE_PROVIDER_SITE_OTHER): Admitting: Physician Assistant

## 2024-02-10 VITALS — BP 120/81 | HR 74 | Temp 98.7°F

## 2024-02-10 DIAGNOSIS — H9042 Sensorineural hearing loss, unilateral, left ear, with unrestricted hearing on the contralateral side: Secondary | ICD-10-CM | POA: Diagnosis not present

## 2024-02-10 DIAGNOSIS — H9122 Sudden idiopathic hearing loss, left ear: Secondary | ICD-10-CM
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
## 2024-02-11 ENCOUNTER — Telehealth: Payer: Self-pay | Admitting: Family Medicine

## 2024-02-11 NOTE — Telephone Encounter (Signed)
 Patient calls wanting to know if Dr. Rosalynn needs to see her before her surgery on December 8th. Patent called to schedule physical but made patient aware she already had a physical this year. Please let patient know if she needs to follow up on anything before surgery date.

## 2024-02-12 ENCOUNTER — Ambulatory Visit

## 2024-02-12 VITALS — Ht 68.0 in | Wt 210.0 lb

## 2024-02-12 DIAGNOSIS — Z0000 Encounter for general adult medical examination without abnormal findings: Secondary | ICD-10-CM

## 2024-02-12 NOTE — Patient Instructions (Signed)
 Shannon Stuart,  Thank you for taking the time for your Medicare Wellness Visit. I appreciate your continued commitment to your health goals. Please review the care plan we discussed, and feel free to reach out if I can assist you further.  Please note that Annual Wellness Visits do not include a physical exam. Some assessments may be limited, especially if the visit was conducted virtually. If needed, we may recommend an in-person follow-up with your provider.  Ongoing Care Seeing your primary care provider every 3 to 6 months helps us  monitor your health and provide consistent, personalized care.   Referrals If a referral was made during today's visit and you haven't received any updates within two weeks, please contact the referred provider directly to check on the status.  Recommended Screenings:  Health Maintenance  Topic Date Due   Zoster (Shingles) Vaccine (1 of 2) Never done   Osteoporosis screening with Bone Density Scan  Never done   Medicare Annual Wellness Visit  09/24/2023   Flu Shot  10/24/2023   COVID-19 Vaccine (7 - 2025-26 season) 11/24/2023   Pap with HPV screening  06/07/2025   Breast Cancer Screening  11/13/2025   Colon Cancer Screening  03/14/2028   DTaP/Tdap/Td vaccine (3 - Td or Tdap) 02/23/2032   Pneumococcal Vaccine for age over 73  Completed   Hepatitis C Screening  Completed   HIV Screening  Completed   Hepatitis B Vaccine  Aged Out   Meningitis B Vaccine  Aged Out       02/12/2024    9:15 AM  Advanced Directives  Does Patient Have a Medical Advance Directive? No  Would patient like information on creating a medical advance directive? No - Patient declined    Vision: Annual vision screenings are recommended for early detection of glaucoma, cataracts, and diabetic retinopathy. These exams can also reveal signs of chronic conditions such as diabetes and high blood pressure.  Dental: Annual dental screenings help detect early signs of oral cancer, gum  disease, and other conditions linked to overall health, including heart disease and diabetes.  Please see the attached documents for additional preventive care recommendations.
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
## 2024-02-17 ENCOUNTER — Other Ambulatory Visit: Payer: Self-pay

## 2024-02-17 MED ORDER — OXYCODONE-ACETAMINOPHEN 10-325 MG PO TABS: 1.0000 | ORAL_TABLET | Freq: Three times a day (TID) | ORAL | 0 refills | Status: DC

## 2024-02-23 ENCOUNTER — Other Ambulatory Visit: Payer: Self-pay

## 2024-02-23 DIAGNOSIS — M5441 Lumbago with sciatica, right side: Secondary | ICD-10-CM

## 2024-02-24 MED ORDER — CYCLOBENZAPRINE HCL 10 MG PO TABS: 10.0000 mg | ORAL_TABLET | Freq: Three times a day (TID) | ORAL | 0 refills | Status: DC

## 2024-02-25 ENCOUNTER — Encounter (HOSPITAL_COMMUNITY): Payer: Self-pay

## 2024-02-25 NOTE — Pre-Procedure Instructions (Signed)
 Surgical Instructions   Your procedure is scheduled on March 01, 2024. Report to [facility] Main Entrance A at 1:00 P.M., then check in with the Admitting office. Any questions or running late day of surgery: call [phone]  Questions prior to your surgery date: call [phone], Monday-Friday, 8am-4pm. If you experience any cold or flu symptoms such as cough, fever, chills, shortness of breath, etc. between now and your scheduled surgery, please notify us  at the above number.     Remember:  Do not eat or drink after midnight the night before your surgery    Take these medicines the morning of surgery with A SIP OF WATER: ALPRAZolam  (XANAX )  amLODipine  (NORVASC )  cyclobenzaprine  (FLEXERIL )  metoprolol  tartrate (LOPRESSOR )  oxyCODONE -acetaminophen  (PERCOCET)  pantoprazole  (PROTONIX )  rosuvastatin  (CRESTOR )    One week prior to surgery, STOP taking any Aspirin  (unless otherwise instructed by your surgeon) Aleve , Naproxen , Ibuprofen , Motrin , Advil , Goody's, BC's, all herbal medications, fish oil, and non-prescription vitamins. This includes your medication: diclofenac  Sodium (VOLTAREN ) GEL                      Do NOT Smoke (Tobacco/Vaping) for 24 hours prior to your procedure.  If you use a CPAP at night, you may bring your mask/headgear for your overnight stay.   You will be asked to remove any contacts, glasses, piercing's, hearing aid's, dentures/partials prior to surgery. Please bring cases for these items if needed.    Patients discharged the day of surgery will not be allowed to drive home, and someone needs to stay with them for 24 hours.  SURGICAL WAITING ROOM VISITATION Patients may have no more than 2 support people in the waiting area - these visitors may rotate.   Pre-op nurse will coordinate an appropriate time for 1 ADULT support person, who may not rotate, to accompany patient in pre-op.  Children under the age of 56 must have an adult with them who is not  the patient and must remain in the main waiting area with an adult.  If the patient needs to stay at the hospital during part of their recovery, the visitor guidelines for inpatient rooms apply.  Please refer to the [facility] website for the visitor guidelines for any additional information.   If you received a COVID test during your pre-op visit  it is requested that you wear a mask when out in public, stay away from anyone that may not be feeling well and notify your surgeon if you develop symptoms. If you have been in contact with anyone that has tested positive in the last 10 days please notify you surgeon.      Pre-operative CHG Bathing Instructions   You can play a key role in reducing the risk of infection after surgery. Your skin needs to be as free of germs as possible. You can reduce the number of germs on your skin by washing with CHG (chlorhexidine  gluconate) soap before surgery. CHG is an antiseptic soap that kills germs and continues to kill germs even after washing.   DO NOT use if you have an allergy to chlorhexidine /CHG or antibacterial soaps. If your skin becomes reddened or irritated, stop using the CHG and notify one of our RNs at [phone].              TAKE A SHOWER THE NIGHT BEFORE SURGERY   Please keep in mind the following:  DO NOT shave, including legs and underarms, 48 hours prior to surgery.  You may shave your face before/day of surgery.  Place clean sheets on your bed the night before surgery Use a clean washcloth (not used since being washed) for shower. DO NOT sleep with pet's night before surgery.  CHG Shower Instructions:  Wash your face and private area with normal soap. If you choose to wash your hair, wash first with your normal shampoo.  After you use shampoo/soap, rinse your hair and body thoroughly to remove shampoo/soap residue.  Turn the water OFF and apply half the bottle of CHG soap to a CLEAN washcloth.  Apply CHG soap ONLY FROM YOUR  NECK DOWN TO YOUR TOES (washing for 3-5 minutes)  DO NOT use CHG soap on face, private areas, open wounds, or sores.  Pay special attention to the area where your surgery is being performed.  If you are having back surgery, having someone wash your back for you may be helpful. Wait 2 minutes after CHG soap is applied, then you may rinse off the CHG soap.  Pat dry with a clean towel  Put on clean pajamas    Additional instructions for the day of surgery: If you choose, you may shower the morning of surgery with an antibacterial soap.  DO NOT APPLY any lotions, deodorants, cologne, or perfumes.   Do not wear jewelry or makeup Do not wear nail polish, gel polish, artificial nails, or any other type of covering on natural nails (fingers and toes) Do not bring valuables to the hospital. [facility] is not responsible for valuables/personal belongings. Put on clean/comfortable clothes.  Please brush your teeth.  Ask your nurse before applying any prescription medications to the skin.

## 2024-02-26 ENCOUNTER — Other Ambulatory Visit: Payer: Self-pay

## 2024-02-26 ENCOUNTER — Encounter (HOSPITAL_COMMUNITY): Payer: Self-pay

## 2024-02-26 ENCOUNTER — Inpatient Hospital Stay (HOSPITAL_COMMUNITY): Admission: RE | Admit: 2024-02-26 | Discharge: 2024-02-26 | Attending: Neurological Surgery

## 2024-02-26 VITALS — BP 117/72 | HR 73 | Temp 98.4°F | Resp 18 | Ht 68.0 in | Wt 215.1 lb

## 2024-02-26 DIAGNOSIS — E11A Type 2 diabetes mellitus without complications in remission: Secondary | ICD-10-CM

## 2024-02-26 DIAGNOSIS — Z01818 Encounter for other preprocedural examination: Secondary | ICD-10-CM

## 2024-02-26 HISTORY — DX: Type 2 diabetes mellitus without complications: E11.9

## 2024-02-26 LAB — CBC
HCT: 40.2 % (ref 36.0–46.0)
Hemoglobin: 12.9 g/dL (ref 12.0–15.0)
MCH: 29.2 pg (ref 26.0–34.0)
MCHC: 32.1 g/dL (ref 30.0–36.0)
MCV: 91 fL (ref 80.0–100.0)
Platelets: 205 K/uL (ref 150–400)
RBC: 4.42 MIL/uL (ref 3.87–5.11)
RDW: 16.3 % — ABNORMAL HIGH (ref 11.5–15.5)
WBC: 8.8 K/uL (ref 4.0–10.5)
nRBC: 0 % (ref 0.0–0.2)

## 2024-02-26 LAB — BASIC METABOLIC PANEL WITH GFR
Anion gap: 10 (ref 5–15)
BUN: 16 mg/dL (ref 8–23)
CO2: 27 mmol/L (ref 22–32)
Calcium: 8.7 mg/dL — ABNORMAL LOW (ref 8.9–10.3)
Chloride: 101 mmol/L (ref 98–111)
Creatinine, Ser: 1.11 mg/dL — ABNORMAL HIGH (ref 0.44–1.00)
GFR, Estimated: 55 mL/min — ABNORMAL LOW (ref >60)
Glucose, Bld: 125 mg/dL — ABNORMAL HIGH (ref 70–99)
Potassium: 3.8 mmol/L (ref 3.5–5.1)
Sodium: 138 mmol/L (ref 135–145)

## 2024-02-26 LAB — TYPE AND SCREEN
ABO/RH(D): B POS
Antibody Screen: NEGATIVE

## 2024-02-26 LAB — GLUCOSE, CAPILLARY: Glucose-Capillary: 135 mg/dL — ABNORMAL HIGH (ref 70–99)

## 2024-02-26 NOTE — Progress Notes (Signed)
 PCP - Dr. Camie Mulch Cardiologist - Dr. Madonna Large. Saw. Tessa Conte PA-C LOV 03/10/2023, 6 wk f/u. No show for 2 f/u. States she did not know she had f/u appointments. States having intermittent CP and SOB, 3-4 times per week. Denies N/V. + diaphoresis with episodes.   PPM/ICD - denies Device Orders - na Rep Notified - na  Chest x-ray - 04/26/2023 EKG - 04/26/2023 Stress Test - 11/06/2011 ECHO - 02/05/2023 Cardiac Cath -   Sleep Study - denies CPAP - na  Type II diabetic. A1 C 6.7 on 09/03/2023, New A1C drawn at PAT. Blood sugar 135 at PAT appointment. Does not check her her blood sugars and takes no medications for diabetes Fasting Blood Sugar - na Checks Blood Sugar : na  Blood Thinner Instructions: denies Aspirin  Instructions:denies  ERAS Protcol - NPO  Anesthesia review: Yes. CAD, HTN, DM, has not f/u with cardiology.   Patient denies shortness of breath, fever, cough and chest pain at PAT appointment   All instructions explained to the patient, with a verbal understanding of the material. Patient agrees to go over the instructions while at home for a better understanding. Patient also instructed to self quarantine after being tested for COVID-19. The opportunity to ask questions was provided.

## 2024-02-26 NOTE — Pre-Procedure Instructions (Signed)
 Surgical Instructions   Your procedure is scheduled on March 01, 2024. Report to [facility] Main Entrance A at 1:00 P.M., then check in with the Admitting office. Any questions or running late day of surgery: call [phone]  Questions prior to your surgery date: call [phone], Monday-Friday, 8am-4pm. If you experience any cold or flu symptoms such as cough, fever, chills, shortness of breath, etc. between now and your scheduled surgery, please notify us  at the above number.     Remember:  Do not eat or drink after midnight the night before your surgery    Take these medicines the morning of surgery with A SIP OF WATER: ALPRAZolam  (XANAX )  amLODipine  (NORVASC )  cyclobenzaprine  (FLEXERIL )  metoprolol  tartrate (LOPRESSOR )  oxyCODONE -acetaminophen  (PERCOCET)  pantoprazole  (PROTONIX )  rosuvastatin  (CRESTOR )    One week prior to surgery, STOP taking any Aspirin  (unless otherwise instructed by your surgeon) Aleve , Naproxen , Ibuprofen , Motrin , Advil , Goody's, BC's, all herbal medications, fish oil, and non-prescription vitamins. This includes your medication: diclofenac  Sodium (VOLTAREN ) GEL  WHAT DO I DO ABOUT MY DIABETES MEDICATION?   Do not take oral diabetes medicines (pills) the morning of surgery.  The day of surgery, do not take other diabetes injectables, including Byetta (exenatide), Bydureon (exenatide ER), Victoza (liraglutide), or Trulicity (dulaglutide).  If your CBG is greater than 220 mg/dL, you may take  of your sliding scale (correction) dose of insulin.   HOW TO MANAGE YOUR DIABETES BEFORE AND AFTER SURGERY  Why is it important to control my blood sugar before and after surgery? Improving blood sugar levels before and after surgery helps healing and can limit problems. A way of improving blood sugar control is eating a healthy diet by:  Eating less sugar and carbohydrates  Increasing activity/exercise  Talking with your doctor about reaching your  blood sugar goals High blood sugars (greater than 180 mg/dL) can raise your risk of infections and slow your recovery, so you will need to focus on controlling your diabetes during the weeks before surgery. Make sure that the doctor who takes care of your diabetes knows about your planned surgery including the date and location.  How do I manage my blood sugar before surgery? Check your blood sugar at least 4 times a day, starting 2 days before surgery, to make sure that the level is not too high or low.  Check your blood sugar the morning of your surgery when you wake up and every 2 hours until you get to the Short Stay unit.  If your blood sugar is less than 70 mg/dL, you will need to treat for low blood sugar: Do not take insulin. Treat a low blood sugar (less than 70 mg/dL) with  cup of clear juice (cranberry or apple), 4 glucose tablets, OR glucose gel. Recheck blood sugar in 15 minutes after treatment (to make sure it is greater than 70 mg/dL). If your blood sugar is not greater than 70 mg/dL on recheck, call [phone] for further instructions. Report your blood sugar to the short stay nurse when you get to Short Stay.  If you are admitted to the hospital after surgery: Your blood sugar will be checked by the staff and you will probably be given insulin after surgery (instead of oral diabetes medicines) to make sure you have good blood sugar levels. The goal for blood sugar control after surgery is 80-180 mg/dL.  Do NOT Smoke (Tobacco/Vaping) for 24 hours prior to your procedure.  If you use a CPAP at night, you may bring your mask/headgear for your overnight stay.   You will be asked to remove any contacts, glasses, piercing's, hearing aid's, dentures/partials prior to surgery. Please bring cases for these items if needed.    Patients discharged the day of surgery will not be allowed to drive home, and someone needs to stay with them for 24  hours.  SURGICAL WAITING ROOM VISITATION Patients may have no more than 2 support people in the waiting area - these visitors may rotate.   Pre-op nurse will coordinate an appropriate time for 1 ADULT support person, who may not rotate, to accompany patient in pre-op.  Children under the age of 35 must have an adult with them who is not the patient and must remain in the main waiting area with an adult.  If the patient needs to stay at the hospital during part of their recovery, the visitor guidelines for inpatient rooms apply.  Please refer to the [facility] website for the visitor guidelines for any additional information.   If you received a COVID test during your pre-op visit  it is requested that you wear a mask when out in public, stay away from anyone that may not be feeling well and notify your surgeon if you develop symptoms. If you have been in contact with anyone that has tested positive in the last 10 days please notify you surgeon.      Pre-operative CHG Bathing Instructions   You can play a key role in reducing the risk of infection after surgery. Your skin needs to be as free of germs as possible. You can reduce the number of germs on your skin by washing with CHG (chlorhexidine  gluconate) soap before surgery. CHG is an antiseptic soap that kills germs and continues to kill germs even after washing.   DO NOT use if you have an allergy to chlorhexidine /CHG or antibacterial soaps. If your skin becomes reddened or irritated, stop using the CHG and notify one of our RNs at [phone].              TAKE A SHOWER THE NIGHT BEFORE SURGERY   Please keep in mind the following:  DO NOT shave, including legs and underarms, 48 hours prior to surgery.   You may shave your face before/day of surgery.  Place clean sheets on your bed the night before surgery Use a clean washcloth (not used since being washed) for shower. DO NOT sleep with pet's night before surgery.  CHG Shower  Instructions:  Wash your face and private area with normal soap. If you choose to wash your hair, wash first with your normal shampoo.  After you use shampoo/soap, rinse your hair and body thoroughly to remove shampoo/soap residue.  Turn the water OFF and apply half the bottle of CHG soap to a CLEAN washcloth.  Apply CHG soap ONLY FROM YOUR NECK DOWN TO YOUR TOES (washing for 3-5 minutes)  DO NOT use CHG soap on face, private areas, open wounds, or sores.  Pay special attention to the area where your surgery is being performed.  If you are having back surgery, having someone wash your back for you may be helpful. Wait 2 minutes after CHG soap is applied, then you may rinse off the CHG soap.  Pat dry with a clean towel  Put on clean pajamas    Additional instructions for the day  of surgery: If you choose, you may shower the morning of surgery with an antibacterial soap.  DO NOT APPLY any lotions, deodorants, cologne, or perfumes.   Do not wear jewelry or makeup Do not wear nail polish, gel polish, artificial nails, or any other type of covering on natural nails (fingers and toes) Do not bring valuables to the hospital. [facility] is not responsible for valuables/personal belongings. Put on clean/comfortable clothes.  Please brush your teeth.  Ask your nurse before applying any prescription medications to the skin.

## 2024-02-27 LAB — HEMOGLOBIN A1C
Hgb A1c MFr Bld: 7 % — ABNORMAL HIGH (ref 4.8–5.6)
Mean Plasma Glucose: 154 mg/dL

## 2024-02-27 NOTE — Progress Notes (Signed)
 Anesthesia Chart Review:  63 year old female with pertinent history including HTN, HLD, atypical chest pain, GERD on PPI, anxiety/depression, headaches, chronic back pain, intractable nausea and vomiting, obesity BMI 32, diet-controlled DM2.  Patient was evaluated by cardiology in November 2024 with complaint of chest pain.  Echocardiogram, coronary CTA, and event monitor were ordered.  Echo showed LVEF 60 to 65%, normal diastolic parameters, normal RV, no significant valvular abnormalities.  Coronary CTA showed calcium  score of 0, minimal CAD.  Event monitor showed underlying rhythm to be sinus, tachycardia burden of 18%, no atrial fibrillation, VT, high-grade AV block, pauses >3 seconds.  She was prescribed metoprolol .  When seen by PCP Dr. Camie Mulch on 12/24/2023 she continued to report chronic chest pain.  Symptoms felt likely related to anxiety.  Per note, Intermittent chest discomfort, likely related to anxiety. Reviewed normal cardiac workup in January (normal ECHO, cardiac CT, ZIO patch). Discussed ways to access counseling resources.  Subsequently discussed that she would be having upcoming surgery for meningioma resection.  Preop labs reviewed, creatinine mildly elevated 1.11, otherwise unremarkable.  A1c 7.0.  EKG 04/30/2023: Sinus tachycardia.  Rate 119.  RAE.  Low voltage, precordial leads.  Event monitor 02/2023: March 10, 2023-December 30th 2024 Dominant rhythm sinus, tachycardia burden (18%). Heart rate 46-164 bpm.  Avg HR 87 bpm. No atrial fibrillation detected during the monitoring period. No ventricular tachycardia, high grade AV block, pauses (3 seconds or longer). Total supraventricular ectopic burden <1%. Fastest episode of SVT: 03/20/2023: 3:06 AM, 11 beats, 5.3 seconds, average HR 137 bpm, max HR 164 bpm Total ventricular ectopic burden <1%. Patient triggered events: 8.  Underlying rhythm predominantly sinus with rare supraventricular ectopic beats.   Coronary CTA  03/03/2023: IMPRESSION: 1. 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   2. Total plaque volume 7 mm3 which is 25th percentile for age- and sex-matched controls (calcified plaque 0 mm3; non-calcified plaque 7 mm3). TPV is mild.   3. Coronary calcium  score of 0.   4. Normal coronary origins with right dominance. Tortuous coronary arteries.   TTE 02/05/2023:  1. Left ventricular ejection fraction, by estimation, is 60 to 65%. The  left ventricle has normal function. The left ventricle has no regional  wall motion abnormalities. There is mild concentric left ventricular  hypertrophy. Left ventricular diastolic  parameters were normal.   2. Right ventricular systolic function is normal. The right ventricular  size is normal. Tricuspid regurgitation signal is inadequate for assessing  PA pressure.   3. The mitral valve is grossly normal. No evidence of mitral valve  regurgitation. No evidence of mitral stenosis.   4. The aortic valve is grossly normal. Aortic valve regurgitation is not  visualized. No aortic stenosis is present.     Lynwood Geofm RIGGERS [facility] Short Stay Center/Anesthesiology Phone [phone] 02/27/2024 11:00 AM

## 2024-02-27 NOTE — Anesthesia Preprocedure Evaluation (Addendum)
 "                                  Anesthesia Evaluation  Patient identified by MRN, date of birth, ID band Patient awake    Reviewed: Allergy & Precautions, H&P , NPO status , Patient's Chart, lab work & pertinent test results  History of Anesthesia Complications Negative for: history of anesthetic complications  Airway Mallampati: II  TM Distance: >3 FB Neck ROM: Full    Dental no notable dental hx.    Pulmonary sleep apnea , Patient abstained from smoking., former smoker   Pulmonary exam normal breath sounds clear to auscultation       Cardiovascular hypertension, + CAD  Normal cardiovascular exam Rhythm:Regular Rate:Normal     Neuro/Psych  Headaches, neg Seizures PSYCHIATRIC DISORDERS Anxiety Depression    Benign neoplasm of meninges    GI/Hepatic Neg liver ROS,GERD  ,,  Endo/Other  diabetes, Type 2    Renal/GU negative Renal ROS  negative genitourinary   Musculoskeletal  (+) Arthritis ,    Abdominal   Peds negative pediatric ROS (+)  Hematology negative hematology ROS (+)   Anesthesia Other Findings   Reproductive/Obstetrics negative OB ROS                              Anesthesia Physical Anesthesia Plan  ASA: 3  Anesthesia Plan: General   Post-op Pain Management:    Induction: Intravenous  PONV Risk Score and Plan: 3 and Ondansetron , Dexamethasone , Midazolam  and Treatment may vary due to age or medical condition  Airway Management Planned: Oral ETT  Additional Equipment: Arterial line  Intra-op Plan:   Post-operative Plan: Extubation in OR  Informed Consent: I have reviewed the patients History and Physical, chart, labs and discussed the procedure including the risks, benefits and alternatives for the proposed anesthesia with the patient or authorized representative who has indicated his/her understanding and acceptance.     Dental advisory given  Plan Discussed with: CRNA  Anesthesia Plan  Comments: (PAT note by Lynwood Hope, PA-C with follow-up by Isaiah Ruder, PA-C. Surgery had been planned for 03/01/2024, but she required admission 02/27/2024 - 03/01/2024 for N/V with AMS. She declined surgery in [place] and preferred to reschedule back in [place]. Felt to be back at baseline per [name], MD hospital follow-up.   63 year old female with pertinent history including HTN, HLD, atypical chest pain, GERD on PPI, anxiety/depression, headaches, chronic back pain, intractable nausea and vomiting, obesity BMI 32, diet-controlled DM2.  Patient was evaluated by cardiology in November 2024 with complaint of chest pain.  Echocardiogram, coronary CTA, and event monitor were ordered.  Echo showed LVEF 60 to 65%, normal diastolic parameters, normal RV, no significant valvular abnormalities.  Coronary CTA showed calcium  score of 0, minimal CAD.  Event monitor showed underlying rhythm to be sinus, tachycardia burden of 18%, no atrial fibrillation, VT, high-grade AV block, pauses >3 seconds.  She was prescribed metoprolol . Has chronic chest pain, believed likely related to anxiety.   Event monitor 02/2023: March 10, 2023-December 30th 2024 Dominant rhythm sinus, tachycardia burden (18%). Heart rate 46-164 bpm.  Avg HR 87 bpm. No atrial fibrillation detected during the monitoring period. No ventricular tachycardia, high grade AV block, pauses (3 seconds or longer). Total supraventricular ectopic burden <1%. Fastest episode of SVT: 03/20/2023: 3:06 AM, 11 beats, 5.3 seconds, average HR  137 bpm, max HR 164 bpm Total ventricular ectopic burden <1%. Patient triggered events: 8.  Underlying rhythm predominantly sinus with rare supraventricular ectopic beats.   Coronary CTA 03/03/2023: IMPRESSION: 1. 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 2. Total plaque volume 7 mm3 which is 25th percentile for age- and sex-matched controls (calcified plaque 0 mm3; non-calcified plaque 7 mm3). TPV is mild. 3. Coronary  calcium  score of 0. 4. Normal coronary origins with right dominance. Tortuous coronary arteries.   TTE 02/05/2023:  1. Left ventricular ejection fraction, by estimation, is 60 to 65%. The  left ventricle has normal function. The left ventricle has no regional  wall motion abnormalities. There is mild concentric left ventricular  hypertrophy. Left ventricular diastolic  parameters were normal.   2. Right ventricular systolic function is normal. The right ventricular  size is normal. Tricuspid regurgitation signal is inadequate for assessing  PA pressure.   3. The mitral valve is grossly normal. No evidence of mitral valve  regurgitation. No evidence of mitral stenosis.   4. The aortic valve is grossly normal. Aortic valve regurgitation is not  visualized. No aortic stenosis is present.    )         Anesthesia Quick Evaluation  "

## 2024-02-28 NOTE — Progress Notes (Addendum)
 PROGRESS NOTE:  Patient seen and examined by me on 02/28/2024  Subjective Subjective/Interval Events:  Stopped morphine  and zofran  due to listed allergies - unclear why used given allergy status. Used compazine  and oxycodone  instead.   REVIEW OF SYSTEMS: All other 10 systems reviewed and negative, unless otherwise stated above.  Assessment ASSESSMENT AND PLAN: 63 year old female with past medical history of meningiomas, chronic back pain, chronic leg spasms, ?CHF, HTN, HLD who presented to the ED after fall with altered mental status. Has these episodes in the past.  #Fall #TME #Nausea/vomiting #hx of meningioma #HA -utox + bzd and THC -vEEG completed and negative for seizure. [ ]  MRI brain pending - 1000mg  IV valproic acid - monitor CBC and LFTs closely. if using multiple doses will monitor levels. -s/p decadron  and magnesium , started ontopiramate for headaches  HTN/HLD -continue amlodipine , lasix , lisinopril , metoprolol , and crestor   GERD -cont PPI   Tests ordered by me: CBC, BMP, LFTs, MRI brain Plan Discussed with (Consultant) 02/28/2024. Neurology re: treatment plans and diagnostic testing Discussed with family/HCP/POA: 02/28/2024 All questions answered and updated on plan. Discussed with sister Shannon Stuart Case discussed with physician advisor, case manager, social worker, and other members of the care team on interdisciplinary rounds on 02/28/24 regarding current treatment plans and discharge plans.   VTE Prophylaxis Medications  Medication Dose Frequency  . enoxaparin  (Lovenox ) syringe 40 mg  40 mg q24h    Code Status- Full Code       Objective VITALS:  Last 24 hours (Low-High, Last) - Temp:  [36.6 C (97.9 F)-37.2 C (99 F)] 36.8 C (98.2 F) Heart Rate:  [63-91] 88 Resp:  [20-28] 20 BP: (152-190)/(85-115) 152/98 SpO2:  [97 %-99 %] 99 % Height -   Weight - 91.4 kg (201 lb 8 oz) There is no height or weight on file to calculate BMI.  I&O  Summary  Intake/Output Summary (Last 24 hours) at 02/28/2024 1233 Last data filed at 02/28/2024 0930 Gross per 24 hour  Intake 153 ml  Output --  Net 153 ml   No results found for: POCTGLUC   PHYSICAL EXAM  GENERAL: Awake, Alert, and Oriented x 3, No acute distress EYES:   PERRL EOMI  ENT:  Moist mucous membranes  Neck: No JVD, No Masses CARDIO:  +S1/S2, No murmurs, RRR  EXT: No edema, No calf tenderness LUNGS:  Good air entry, Nonlabored, No wheezing, rhonchi or crackles  ABDOMEN: + BS, Soft, NT, ND NEURO:  Alert and oriented x 3, CN II-XII intact , Motor 5/5  PSYCH:  Affect appropriate, Good Insight/Judgement/Memory  SKIN: No rashes, no induration  Objective Recent Labs  Lab Units 02/28/24 0505 02/27/24 1540  WBC AUTO 10*3/uL 14.22* 11.25*  HEMATOCRIT % 40.6 40.9  HEMOGLOBIN g/dL 86.5 86.4  PLATELETS AUTO 10*3/uL 227 231  MCV fL 86.4 88.0  RDW-CV % 16.3* 16.4*   Recent Labs  Lab Units 02/27/24 1540  INR  0.97     Recent Labs  Lab Units 02/28/24 0505 02/27/24 1540  SODIUM mmol/L 140 142  POTASSIUM mmol/L 3.7 3.5  CHLORIDE mmol/L 99 101  CO2 mmol/L 25.9 25.5  BUN mg/dL 12 16  CREATININE mg/dL 0.9 1.0  GLUCOSE mg/dL 828* 821*  CALCIUM  mg/dL 9.1 89.9  MAGNESIUM  mg/dL 2.4  --   ANION GAP mmol/L 15 16  ALT U/L  --  30  AST U/L  --  37  ALK PHOS U/L  --  115  BILIRUBIN TOTAL mg/dL  --  0.5  Lab Results  Component Value Date   HGBA1C 6.9 (H) 02/28/2024    Lab Results  Component Value Date   CHOL 159 02/28/2024   Lab Results  Component Value Date   HDL 52 (L) 02/28/2024   No results found for: [facility] Lab Results  Component Value Date   TRIG 127 02/28/2024    No results found for: CKTOTAL, CKMB, CKMBINDEX, TROPONINT, TROPONINI No results found for: TROPONINIHS3   Micro:  Microbiology Results     No results found for the last 168 hours.       Microbiology Results     No results found for the last 168 hours.        IMAGING, EKG, AND OTHER STUDIES XR Chest 1 View Result Date: 02/28/2024 Impression -  Postsurgical changes/Catheters and Support Devices: None. Lungs/Pleural Spaces: Within normal limits. Heart/Mediastinum: Within normal limits.  Other: No other significant findings. Summary: No evidence of acute thoracic pathology. FINAL RESULT BY: Lyanne Fisherman MD, [facility]  DICTATED: 02/28/2024 8:10 AM  CTA Neck With IV Contrast Result Date: 02/27/2024 Impression -  No hemodynamically significant stenosis of the cervical carotid or vertebral arteries. Evaluation for stenosis of the internal carotid arteries is based on the NASCET Trial [facility] American Symptomatic Carotid Endarterectomy Trial). There are coronary artery calcifications which can be a marker of coronary artery disease. DICTATED BY RESIDENT PHYSICIAN OR PHYSICIAN ASSISTANT: Royden Pott MD 02/27/2024 4:37 PM Images and Interpretation/Procedure Reviewed by: Floyce Dotter MD FINAL RESULT BY: Floyce Dotter MD  DICTATED: 02/27/2024 4:41 PM  CTA Head With IV Contrast Result Date: 02/27/2024 Impression -  No evidence of large aneurysm, high-grade stenosis, or large vessel occlusion. The findings within this report have been reviewed with the attending radiologist Floyce Dotter MD. DICTATED BY RESIDENT PHYSICIAN OR PHYSICIAN ASSISTANT: Royden Pott MD 02/27/2024 4:38 PM Images and Interpretation/Procedure Reviewed by: Floyce Dotter MD FINAL RESULT BY: Floyce Dotter MD  DICTATED: 02/27/2024 4:41 PM  CT Head Code Stroke Result Date: 02/27/2024 Impression -  No acute intracranial hemorrhage or mass effect. Findings were discussed with Neurology Resident Dr. Stephens Dakins on 02/27/2024 4:17 PM. Interpretation of the CT exam was performed within 20 minutes of scan time by an Attending Radiologist as per stroke protocol. The findings within this report have been reviewed with the attending radiologist Floyce Dotter MD.  DICTATED BY  RESIDENT PHYSICIAN OR PHYSICIAN ASSISTANT: Royden Pott MD 02/27/2024 4:18 PM Images and Interpretation/Procedure Reviewed by: Floyce Dotter MD FINAL RESULT BY: Floyce Dotter MD  DICTATED: 02/27/2024 4:41 PM     CURRENT MEDICATIONS:  Scheduled  Medication Dose Route Frequency  . amLODIPine   5 mg oral Daily  . diclofenac   2 g topical 4x daily  . enoxaparin   40 mg subcutaneous q24h  . furosemide   20 mg oral Daily  . influenza vaccine  0.5 mL intramuscular During hospitalization  . lisinopriL   40 mg oral Daily  . metoprolol  tartrate  25 mg oral BID  . pantoprazole   40 mg oral Daily  . rosuvastatin   40 mg oral Daily    PRN Medications[1]  Continuous Medications[2]   All medications reviewed by me.     Shannon Stuart, 12-05-1960 (63 y.o.) female  MRN: [id number] Encounter: [id number]      PCP: [name], MD  Unit/Bed#: 735T-A            [1] PRN  Medication Dose Route Frequency  . acetaminophen   650 mg oral q6h PRN  . cyclobenzaprine   5 mg  oral q8h PRN  . diazePAM   7.5 mg oral Once PRN  . oxyCODONE   5 mg oral q6h PRN  . prochlorperazine   2.5 mg intravenous q6h PRN  . traZODone   100 mg oral Nightly PRN  [2] Continuous Infusions  Medication Last Rate

## 2024-03-01 NOTE — Discharge Summary (Signed)
 DISCHARGE SUMMARY:   PCP: [name], MD  Unit/Bed#:  NBR 7T NEURO/IMCU 735T-A Code Status: Prior  ADMISSION DATE: 02/27/2024  3:27 PM   DISCHARGE DATE: 03/01/24  DISCHARGE DIAGNOSES: Meningioma  HISTORY OF PRESENT ILLNESS: Shannon Stuart is a 63 y.o. female with a past medical history significant of meningiomas, chronic back pain, chronic leg spasms, ?CHF, HTN, HLD who presented to the ED after fall with altered mental status.  As per patient she began vomiting yesterday which she says has happened to her before and is easily triggered by smells or tastes.  She says that when she starts vomiting it is hard for her to stop and she then can become dehydrated and get muscle spasms which is what caused her to fall today.  Regarding the altered mentation that was seen in the ED she says that this may have been due to her nausea, vomiting, and intense back pain.  She does also endorse chest pain which she says has happened to her before but is getting worse now.  She describes it as a sharp and also like someone is sitting on her chest.  At time of my exam she is complaining of back pain and nausea but otherwise denies any acute symptoms. .   HOSPITAL COURSE: Patient with MRI brain here showed R frontal mass with mild/3 MM right to left shifting and mild associated vasogenic edema.  NSGY was consulted and they recommended surgical resection however patient is visiting from [facility] and wanted to go back to [facility] for planned surgical removal of known meningioma. Per my discussion with neurology, patient can be treated for migraines with reglan , magnesium , NSAIDs for HA which she had been getting here.  As such patient was discharged to home where her friend was going to drive her to [facility].  Meds to beds provided for new meds and script provided for flexeril  prn.   RADIOLOGY REPORTS: MRI Brain With and Without Contrast Result Date: 02/29/2024 Impression -  Right inferior frontal extra-axial mass with mild  subjacent mass effect on the frontal lobe and mild 3 mm right to left shift at the level of the anteroinferior interhemispheric falx.  Finding is favored to represent a meningioma.  There is mild associated vasogenic edema. Smaller 5 mm right frontal extra-axial enhancing lesion likely represents an additional meningioma. Patient appears to be status post prior transsphenoidal hypophysectomy.  There is apparent nonspecific nodular thickening along the inferior aspect of the infundibulum measuring up to 3 mm in thickness (series 19 image 91).  No priors are available for direct comparison.  Consider correlation with clinical history and comparison with priors if available. FINAL RESULT BY: Ozell Lex MD  DICTATED: 02/29/2024 10:14 AM    PROCEDURES: None   PHYSICAL EXAM ON DISCHARGE Visit Vitals BP 127/84 (BP Location: Right arm, Patient Position: Sitting)  Pulse 75  Temp 37.4 C (99.4 F) (Oral)  Resp 18     PHYSICAL EXAM GENERAL: Awake, Alert, and Oriented x 3, No acute distress EYES:   PERRL EOMI  ENT:  Moist mucous membranes  CARDIO:  +S1/S2, No murmurs, RRR  EXT: No edema, No calf tenderness LUNGS:  Good air entry, Nonlabored, No wheezing, rhonchi or crackles  ABDOMEN: + BS, Soft, NT, ND NEURO:  Alert and oriented x 3, CN II-XII intact , Motor 5/5  PSYCH:  Affect appropriate, Good Insight/Judgement/Memory  SKIN: No rashes, no induration  CONSULTS OBTAINED: Discharge Consults (720h ago, onward)     Start  Ordered   02/29/24 1320  Inpatient consult to Neurosurgery  Once       Comments: Patient with meningioma and vasogenic edema, presented with AMS and fall  Specialty:  Neurosurgery  Provider:  Burnis Dorn Lesches, MD  Question Answer Comment  Reason for Consult? Patient with meningioma and vasogenic edema, presented with AMS and fall   Level of Consultation Consultation and Management      02/29/24 1320   02/28/24 1232  Inpatient consult to Home Health Services   Once,   Status:  Canceled       Specialty:  General Surgery  Provider:  (Not yet assigned)  Question Answer Comment  On what date was the Face to Face Encounter? 02/28/2024   Disciplines Requested Physical Therapy   Physician to follow patient's care PCP      02/28/24 1232            DISCHARGE PLAN:  Disposition: home Condition on DC: Stable Diet:   FOLLOW-UP CARE: Discharge Instructions (720h ago, onward)     Start     Ordered   03/01/24 0000  Any questions or concerns        03/01/24 1340   03/01/24 0000  Additional discharge instructions       Comments: It was a pleasure taking care of you Shannon Stuart. You were admitted for confusion and fall in the setting of having th meningioma in your brain. We consulted neurology and neurosurgery who recommended surgery - which you wanted to pursue at home in [place] .  Please make sure to take the medications as prescribed and please makes sure to go home with your friend (please do not drive, you noted to me she will drive) and continue your care in [place]  per your preference.  Should anything change, please make sure to come right back.  We wish you well.   03/01/24 1340           No future appointments.  DISCHARGE MEDICATIONS:   Medication List    START taking these medications    cyclobenzaprine  5 mg tablet; Commonly known as: Flexeril ; Take 1 tablet  (5 mg total) by mouth every 8 (eight) hours if needed for muscle spasms  for up to 10 days.  magnesium  oxide 400 mg (241.3 mg magnesium ) tablet; Commonly known as:  Mag-Ox; Take 1 tablet (400 mg total) by mouth 1 (one) time each day.;  Start taking on: March 02, 2024  metoclopramide  10 mg tablet; Commonly known as: Reglan ; Take 1 tablet  (10 mg total) by mouth 3 (three) times a day before meals for 10 days.  oxyCODONE  5 mg immediate release tablet; Commonly known as: Roxicodone ;  Take 1 tablet (5 mg total) by mouth every 6 (six) hours if needed for   moderate pain (4-6) for up to 5 days.  topiramate 50 mg tablet; Take 1 tablet (50 mg total) by mouth 2 (two)  times a day.   CONTINUE taking these medications    amLODIPine  5 mg tablet; Commonly known as: Norvasc   furosemide  20 mg tablet; Commonly known as: Lasix   ibuprofen  800 mg tablet; Commonly known as: Advil ,Motrin   lisinopriL  40 mg tablet; Commonly known as: Prinivil , Zestril   metoprolol  tartrate 25 mg tablet; Commonly known as: Lopressor   pantoprazole  40 mg EC tablet; Commonly known as: ProtoNix   rosuvastatin  40 mg tablet; Commonly known as: Crestor   traZODone  100 mg tablet; Commonly known as: DesyreL    STOP taking these medications    Voltaren  Arthritis  Pain 1 % topical gel; Generic drug: diclofenac    Per Medication reconciliation list above. Discussed with the patient.  Time was spent on the day of discharge over half of which was spent coordinating care and discussing plan of discharge with patient and/or caregiver. I had direct contact with the patient on the day of discharge.  Total time spent discharging the patient: 45 minutes   PMD to be notified of DC via records: [name], MD     CLINICAL CLARIFICATIONS  No notes found.  No notes found.  No notes found.  No notes found.      Quality Clinical Documentation: Other Reduced Mobility: Explanation: Highest Level of Mobility Performed (JH-HLM)-RN Only: 2 Turned self in bed / R.O.M.. Level of Assistance: No assistance needed.  The patient has a degree of difficulty in the following areas:   Standing up from chair: A Little (3 pts) Climbs 3-5 stairs: A Little (3 pts) Walk in room: A Little (3 pts) Treatment: Patient requires additional nursing care.

## 2024-03-03 ENCOUNTER — Ambulatory Visit: Admitting: Family Medicine

## 2024-03-03 ENCOUNTER — Encounter: Payer: Self-pay | Admitting: Family Medicine

## 2024-03-03 VITALS — BP 119/86 | HR 99 | Resp 16 | Wt 208.8 lb

## 2024-03-03 DIAGNOSIS — F418 Other specified anxiety disorders: Secondary | ICD-10-CM

## 2024-03-03 DIAGNOSIS — D3160 Benign neoplasm of unspecified site of unspecified orbit: Secondary | ICD-10-CM

## 2024-03-03 NOTE — Progress Notes (Unsigned)
° ° °  CHIEF COMPLAINT / HPI: Follow-up recent hospitalization in [place]  when she was visiting.  Had acute onset of weakness and altered mental status.  They gave her some fluids and some medication for nausea and she felt much better.  Since returning home she has continued to improve, and is holding down fluids and soups well.  She thinks part of this had to do with her anxiety about upcoming surgery. PERTINENT  PMH / PSH: I have reviewed the patients medications, allergies, past medical and surgical history, smoking status and updated in the EMR as appropriate.   OBJECTIVE:  BP 119/86   Pulse 99   Resp 16   Wt 208 lb 12.8 oz (94.7 kg)   LMP 09/20/2011   SpO2 97%   BMI 31.75 kg/m  Vital signs reviewed. GENERAL: Well-developed, well-nourished, no acute distress. CARDIOVASCULAR: Regular rate and rhythm no murmur gallop or rub LUNGS: Clear to auscultation bilaterally, no rales or wheeze. ABDOMEN: Soft positive bowel sounds NEURO: No gross focal neurological deficits. MSK: Movement of extremity x 4.   ASSESSMENT / PLAN: #1.  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  We discussed at length.  I think she literally worried herself into feeling unable to eat, became dehydrated and ended up hospitalized.  Reviewed her hospital records including labs etc.  No labs needed today for follow-up.  I think she is back to baseline.  Her surgery has been rescheduled.  30 minutes spent face-to-face counseling regarding these issues.  No problem-specific Assessment & Plan notes found for this encounter.   Camie Mulch MD

## 2024-03-05 ENCOUNTER — Other Ambulatory Visit: Payer: Self-pay

## 2024-03-05 DIAGNOSIS — I10 Essential (primary) hypertension: Secondary | ICD-10-CM

## 2024-03-05 MED ORDER — LISINOPRIL 40 MG PO TABS: 40.0000 mg | ORAL_TABLET | Freq: Every day | ORAL | 3 refills | Status: AC

## 2024-03-22 ENCOUNTER — Other Ambulatory Visit: Payer: Self-pay

## 2024-03-22 MED ORDER — OXYCODONE-ACETAMINOPHEN 10-325 MG PO TABS: 1.0000 | ORAL_TABLET | Freq: Three times a day (TID) | ORAL | 0 refills | Status: DC

## 2024-03-31 NOTE — Progress Notes (Signed)
 Surgical Instructions   Your procedure is scheduled on Tuesday April 06, 2024. Report to [facility] Main Entrance A at 5:30 A.M., then check in with the Admitting office. Any questions or running late day of surgery: call [phone]  Questions prior to your surgery date: call [phone], Monday-Friday, 8am-4pm. If you experience any cold or flu symptoms such as cough, fever, chills, shortness of breath, etc. between now and your scheduled surgery, please notify us  at the above number.     Remember:  Do not eat or drink after midnight the night before your surgery  Take these medicines the morning of surgery with A SIP OF WATER  ALPRAZolam  (XANAX )  amLODipine  (NORVASC )  cyclobenzaprine  (FLEXERIL )  metoprolol  tartrate (LOPRESSOR )  oxyCODONE -acetaminophen  (PERCOCET)  pantoprazole  (PROTONIX ) rosuvastatin  (CRESTOR )    May take these medicines IF NEEDED: None  One week prior to surgery, STOP taking any Aspirin  (unless otherwise instructed by your surgeon) Aleve , Naproxen , Ibuprofen , Motrin , Advil , Goody's, BC's, all herbal medications, fish oil, and non-prescription vitamins.  This includes your diclofenac  Sodium (VOLTAREN ) 1 % GEL.     WHAT DO I DO ABOUT MY DIABETES MEDICATION?   Do not take oral diabetes medicines (pills) the morning of surgery.  The day of surgery, do not take other diabetes injectables, including Byetta (exenatide), Bydureon (exenatide ER), Victoza (liraglutide), or Trulicity (dulaglutide).  If your CBG is greater than 220 mg/dL, you may take  of your sliding scale (correction) dose of insulin.   HOW TO MANAGE YOUR DIABETES BEFORE AND AFTER SURGERY  Why is it important to control my blood sugar before and after surgery? Improving blood sugar levels before and after surgery helps healing and can limit problems. A way of improving blood sugar control is eating a healthy diet by:  Eating less sugar and carbohydrates  Increasing activity/exercise   Talking with your doctor about reaching your blood sugar goals High blood sugars (greater than 180 mg/dL) can raise your risk of infections and slow your recovery, so you will need to focus on controlling your diabetes during the weeks before surgery. Make sure that the doctor who takes care of your diabetes knows about your planned surgery including the date and location.  How do I manage my blood sugar before surgery? Check your blood sugar at least 4 times a day, starting 2 days before surgery, to make sure that the level is not too high or low.  Check your blood sugar the morning of your surgery when you wake up and every 2 hours until you get to the Short Stay unit.  If your blood sugar is less than 70 mg/dL, you will need to treat for low blood sugar: Do not take insulin. Treat a low blood sugar (less than 70 mg/dL) with  cup of clear juice (cranberry or apple), 4 glucose tablets, OR glucose gel. Recheck blood sugar in 15 minutes after treatment (to make sure it is greater than 70 mg/dL). If your blood sugar is not greater than 70 mg/dL on recheck, call [phone] for further instructions. Report your blood sugar to the short stay nurse when you get to Short Stay.  If you are admitted to the hospital after surgery: Your blood sugar will be checked by the staff and you will probably be given insulin after surgery (instead of oral diabetes medicines) to make sure you have good blood sugar levels. The goal for blood sugar control after surgery is 80-180 mg/dL.  Do NOT Smoke (Tobacco/Vaping) for 24 hours prior to your procedure.  If you use a CPAP at night, you may bring your mask/headgear for your overnight stay.   You will be asked to remove any contacts, glasses, piercing's, hearing aid's, dentures/partials prior to surgery. Please bring cases for these items if needed.    Patients discharged the day of surgery will not be allowed to drive home, and someone  needs to stay with them for 24 hours.  SURGICAL WAITING ROOM VISITATION Patients may have no more than 2 support people in the waiting area - these visitors may rotate.   Pre-op nurse will coordinate an appropriate time for 1 ADULT support person, who may not rotate, to accompany patient in pre-op.  Children under the age of 16 must have an adult with them who is not the patient and must remain in the main waiting area with an adult.  If the patient needs to stay at the hospital during part of their recovery, the visitor guidelines for inpatient rooms apply.  Please refer to the [facility] website for the visitor guidelines for any additional information.   If you received a COVID test during your pre-op visit  it is requested that you wear a mask when out in public, stay away from anyone that may not be feeling well and notify your surgeon if you develop symptoms. If you have been in contact with anyone that has tested positive in the last 10 days please notify you surgeon.      Pre-operative CHG Bathing Instructions   You can play a key role in reducing the risk of infection after surgery. Your skin needs to be as free of germs as possible. You can reduce the number of germs on your skin by washing with CHG (chlorhexidine  gluconate) soap before surgery. CHG is an antiseptic soap that kills germs and continues to kill germs even after washing.   DO NOT use if you have an allergy to chlorhexidine /CHG or antibacterial soaps. If your skin becomes reddened or irritated, stop using the CHG and notify one of our RNs at [phone].              TAKE A SHOWER THE NIGHT BEFORE SURGERY   Please keep in mind the following:  DO NOT shave, including legs and underarms, 48 hours prior to surgery.   Place clean sheets on your bed the night before surgery Use a clean washcloth (not used since being washed) for shower. DO NOT sleep with pet's night before surgery.  CHG Shower Instructions:  Wash  your face and private area with normal soap. If you choose to wash your hair, wash first with your normal shampoo.  After you use shampoo/soap, rinse your hair and body thoroughly to remove shampoo/soap residue.  Turn the water OFF and apply half the bottle of CHG soap to a CLEAN washcloth.  Apply CHG soap ONLY FROM YOUR NECK DOWN TO YOUR TOES (washing for 3-5 minutes)  DO NOT use CHG soap on face, private areas, open wounds, or sores.  Pay special attention to the area where your surgery is being performed.  If you are having back surgery, having someone wash your back for you may be helpful. Wait 2 minutes after CHG soap is applied, then you may rinse off the CHG soap.  Pat dry with a clean towel  Put on clean pajamas    Additional instructions for the day of surgery: If you choose, you may shower the  morning of surgery with an antibacterial soap.  DO NOT APPLY any lotions, deodorants or perfumes.   Do not wear jewelry or makeup Do not wear nail polish, gel polish, artificial nails, or any other type of covering on natural nails (fingers and toes) Do not bring valuables to the hospital. [facility] is not responsible for valuables/personal belongings. Put on clean/comfortable clothes.  Please brush your teeth.  Ask your nurse before applying any prescription medications to the skin.

## 2024-04-01 ENCOUNTER — Other Ambulatory Visit: Payer: Self-pay

## 2024-04-01 ENCOUNTER — Encounter (HOSPITAL_COMMUNITY)
Admission: RE | Admit: 2024-04-01 | Discharge: 2024-04-01 | Disposition: A | Discharge status: Home / Self Care | Source: Ambulatory Visit | Attending: Neurological Surgery | Admitting: Neurological Surgery

## 2024-04-01 ENCOUNTER — Encounter (HOSPITAL_COMMUNITY): Payer: Self-pay

## 2024-04-01 VITALS — BP 115/85 | HR 74 | Temp 97.8°F | Resp 17 | Ht 68.0 in | Wt 216.0 lb

## 2024-04-01 DIAGNOSIS — Z6832 Body mass index (BMI) 32.0-32.9, adult: Secondary | ICD-10-CM | POA: Diagnosis not present

## 2024-04-01 DIAGNOSIS — Z01812 Encounter for preprocedural laboratory examination: Secondary | ICD-10-CM | POA: Diagnosis present

## 2024-04-01 DIAGNOSIS — E669 Obesity, unspecified: Secondary | ICD-10-CM | POA: Insufficient documentation

## 2024-04-01 DIAGNOSIS — I10 Essential (primary) hypertension: Secondary | ICD-10-CM | POA: Insufficient documentation

## 2024-04-01 DIAGNOSIS — G936 Cerebral edema: Secondary | ICD-10-CM | POA: Diagnosis not present

## 2024-04-01 DIAGNOSIS — Z96642 Presence of left artificial hip joint: Secondary | ICD-10-CM | POA: Insufficient documentation

## 2024-04-01 DIAGNOSIS — I2510 Atherosclerotic heart disease of native coronary artery without angina pectoris: Secondary | ICD-10-CM | POA: Insufficient documentation

## 2024-04-01 DIAGNOSIS — Z87891 Personal history of nicotine dependence: Secondary | ICD-10-CM | POA: Diagnosis not present

## 2024-04-01 DIAGNOSIS — Z01818 Encounter for other preprocedural examination: Secondary | ICD-10-CM

## 2024-04-01 DIAGNOSIS — Z79899 Other long term (current) drug therapy: Secondary | ICD-10-CM | POA: Diagnosis not present

## 2024-04-01 DIAGNOSIS — K219 Gastro-esophageal reflux disease without esophagitis: Secondary | ICD-10-CM | POA: Diagnosis not present

## 2024-04-01 DIAGNOSIS — E119 Type 2 diabetes mellitus without complications: Secondary | ICD-10-CM | POA: Insufficient documentation

## 2024-04-01 DIAGNOSIS — Z96641 Presence of right artificial hip joint: Secondary | ICD-10-CM | POA: Insufficient documentation

## 2024-04-01 DIAGNOSIS — D329 Benign neoplasm of meninges, unspecified: Secondary | ICD-10-CM | POA: Insufficient documentation

## 2024-04-01 LAB — BASIC METABOLIC PANEL WITH GFR
Anion gap: 10 (ref 5–15)
BUN: 17 mg/dL (ref 8–23)
CO2: 25 mmol/L (ref 22–32)
Calcium: 8.7 mg/dL — ABNORMAL LOW (ref 8.9–10.3)
Chloride: 103 mmol/L (ref 98–111)
Creatinine, Ser: 0.99 mg/dL (ref 0.44–1.00)
GFR, Estimated: >60 mL/min
Glucose, Bld: 108 mg/dL — ABNORMAL HIGH (ref 70–99)
Potassium: 4.2 mmol/L (ref 3.5–5.1)
Sodium: 139 mmol/L (ref 135–145)

## 2024-04-01 LAB — CBC
HCT: 38.8 % (ref 36.0–46.0)
Hemoglobin: 12.2 g/dL (ref 12.0–15.0)
MCH: 29.4 pg (ref 26.0–34.0)
MCHC: 31.4 g/dL (ref 30.0–36.0)
MCV: 93.5 fL (ref 80.0–100.0)
Platelets: 203 K/uL (ref 150–400)
RBC: 4.15 MIL/uL (ref 3.87–5.11)
RDW: 16.6 % — ABNORMAL HIGH (ref 11.5–15.5)
WBC: 9.5 K/uL (ref 4.0–10.5)
nRBC: 0.2 % (ref 0.0–0.2)

## 2024-04-01 LAB — TYPE AND SCREEN
ABO/RH(D): B POS
Antibody Screen: NEGATIVE

## 2024-04-01 LAB — GLUCOSE, CAPILLARY: Glucose-Capillary: 103 mg/dL — ABNORMAL HIGH (ref 70–99)

## 2024-04-01 NOTE — Progress Notes (Signed)
 PCP - Dr. Camie Mulch Cardiologist - Orren Fabry PA-C  PPM/ICD - denies Device Orders - na Rep Notified - na  Chest x-ray - 04/26/2023 EKG - 04/27/2023 Stress Test - 11/06/2011 ECHO - 02/05/2023 Cardiac Cath - denies  Sleep Study - denies CPAP - na  Type II diabetic. A1 C 6.9 on 02/28/24 , does not check at home  Blood Thinner Instructions: denies Aspirin  Instructions:denies  ERAS Protcol - NPO  Anesthesia review: Yes. Recent hospitalization in [place]  Patient denies shortness of breath, fever, cough and chest pain at PAT appointment

## 2024-04-02 ENCOUNTER — Other Ambulatory Visit: Payer: Self-pay | Admitting: Family Medicine

## 2024-04-02 DIAGNOSIS — F32A Depression, unspecified: Secondary | ICD-10-CM

## 2024-04-02 DIAGNOSIS — F418 Other specified anxiety disorders: Secondary | ICD-10-CM
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
## 2024-04-05 ENCOUNTER — Other Ambulatory Visit: Payer: Self-pay | Admitting: Family Medicine

## 2024-04-05 ENCOUNTER — Telehealth: Payer: Self-pay

## 2024-04-05 DIAGNOSIS — M5441 Lumbago with sciatica, right side: Secondary | ICD-10-CM

## 2024-04-05 NOTE — Telephone Encounter (Signed)
 Patient calls nurse line requesting a refill on Flexeril .   She reports due to insurance she has changed pharmacies. She reports she is now using [facility] for maintenance medications.   She reports she is out of this medication all together and is requesting a small supply to [facility].  She is also needing her normal 90 tabs to go to [facility].   Advised forward to PCP.

## 2024-04-06 ENCOUNTER — Inpatient Hospital Stay (HOSPITAL_COMMUNITY): Admission: RE | Disposition: A | Payer: Self-pay | Source: Home / Self Care | Attending: Neurological Surgery

## 2024-04-06 ENCOUNTER — Inpatient Hospital Stay (HOSPITAL_COMMUNITY)
Admission: RE | Admit: 2024-04-06 | Discharge: 2024-04-08 | DRG: 027 | Disposition: A | Discharge status: Home / Self Care | Attending: Neurological Surgery | Admitting: Neurological Surgery

## 2024-04-06 ENCOUNTER — Inpatient Hospital Stay (HOSPITAL_COMMUNITY): Payer: Self-pay | Admitting: Physician Assistant

## 2024-04-06 ENCOUNTER — Encounter (HOSPITAL_COMMUNITY): Payer: Self-pay | Admitting: Neurological Surgery

## 2024-04-06 ENCOUNTER — Inpatient Hospital Stay (HOSPITAL_COMMUNITY)

## 2024-04-06 ENCOUNTER — Inpatient Hospital Stay (HOSPITAL_COMMUNITY): Payer: Self-pay | Admitting: Anesthesiology

## 2024-04-06 DIAGNOSIS — G8929 Other chronic pain: Secondary | ICD-10-CM

## 2024-04-06 DIAGNOSIS — D320 Benign neoplasm of cerebral meninges: Secondary | ICD-10-CM | POA: Diagnosis present

## 2024-04-06 DIAGNOSIS — R252 Cramp and spasm: Secondary | ICD-10-CM | POA: Diagnosis present

## 2024-04-06 DIAGNOSIS — M549 Dorsalgia, unspecified: Secondary | ICD-10-CM | POA: Diagnosis present

## 2024-04-06 DIAGNOSIS — F419 Anxiety disorder, unspecified: Secondary | ICD-10-CM | POA: Diagnosis present

## 2024-04-06 DIAGNOSIS — Z79899 Other long term (current) drug therapy: Secondary | ICD-10-CM

## 2024-04-06 DIAGNOSIS — Z885 Allergy status to narcotic agent status: Secondary | ICD-10-CM

## 2024-04-06 DIAGNOSIS — Z9049 Acquired absence of other specified parts of digestive tract: Secondary | ICD-10-CM

## 2024-04-06 DIAGNOSIS — Z87891 Personal history of nicotine dependence: Secondary | ICD-10-CM

## 2024-04-06 DIAGNOSIS — Z888 Allergy status to other drugs, medicaments and biological substances status: Secondary | ICD-10-CM

## 2024-04-06 DIAGNOSIS — I10 Essential (primary) hypertension: Secondary | ICD-10-CM | POA: Diagnosis present

## 2024-04-06 DIAGNOSIS — Z8249 Family history of ischemic heart disease and other diseases of the circulatory system: Secondary | ICD-10-CM

## 2024-04-06 DIAGNOSIS — Z833 Family history of diabetes mellitus: Secondary | ICD-10-CM

## 2024-04-06 DIAGNOSIS — I2510 Atherosclerotic heart disease of native coronary artery without angina pectoris: Secondary | ICD-10-CM | POA: Diagnosis present

## 2024-04-06 DIAGNOSIS — Z96643 Presence of artificial hip joint, bilateral: Secondary | ICD-10-CM | POA: Diagnosis present

## 2024-04-06 DIAGNOSIS — E119 Type 2 diabetes mellitus without complications: Secondary | ICD-10-CM | POA: Diagnosis present

## 2024-04-06 DIAGNOSIS — E11A Type 2 diabetes mellitus without complications in remission: Secondary | ICD-10-CM

## 2024-04-06 DIAGNOSIS — F32A Depression, unspecified: Secondary | ICD-10-CM | POA: Diagnosis present

## 2024-04-06 DIAGNOSIS — D72829 Elevated white blood cell count, unspecified: Secondary | ICD-10-CM | POA: Diagnosis not present

## 2024-04-06 DIAGNOSIS — Z825 Family history of asthma and other chronic lower respiratory diseases: Secondary | ICD-10-CM

## 2024-04-06 DIAGNOSIS — F418 Other specified anxiety disorders: Secondary | ICD-10-CM

## 2024-04-06 DIAGNOSIS — E785 Hyperlipidemia, unspecified: Secondary | ICD-10-CM

## 2024-04-06 DIAGNOSIS — Z809 Family history of malignant neoplasm, unspecified: Secondary | ICD-10-CM

## 2024-04-06 DIAGNOSIS — D329 Benign neoplasm of meninges, unspecified: Secondary | ICD-10-CM | POA: Diagnosis present

## 2024-04-06 DIAGNOSIS — M459 Ankylosing spondylitis of unspecified sites in spine: Secondary | ICD-10-CM | POA: Diagnosis not present

## 2024-04-06 DIAGNOSIS — Z9889 Other specified postprocedural states: Principal | ICD-10-CM

## 2024-04-06 DIAGNOSIS — Z01818 Encounter for other preprocedural examination: Secondary | ICD-10-CM

## 2024-04-06 HISTORY — PX: CRANIOTOMY: SHX93

## 2024-04-06 HISTORY — PX: APPLICATION OF CRANIAL NAVIGATION: SHX6578

## 2024-04-06 LAB — GLUCOSE, CAPILLARY
Glucose-Capillary: 161 mg/dL — ABNORMAL HIGH (ref 70–99)
Glucose-Capillary: 181 mg/dL — ABNORMAL HIGH (ref 70–99)
Glucose-Capillary: 213 mg/dL — ABNORMAL HIGH (ref 70–99)
Glucose-Capillary: 213 mg/dL — ABNORMAL HIGH (ref 70–99)
Glucose-Capillary: 187 mg/dL — ABNORMAL HIGH (ref 70–99)

## 2024-04-06 LAB — BASIC METABOLIC PANEL WITH GFR
Anion gap: 13 (ref 5–15)
BUN: 11 mg/dL (ref 8–23)
CO2: 26 mmol/L (ref 22–32)
Calcium: 8.8 mg/dL — ABNORMAL LOW (ref 8.9–10.3)
Chloride: 102 mmol/L (ref 98–111)
Creatinine, Ser: 0.88 mg/dL (ref 0.44–1.00)
GFR, Estimated: >60 mL/min
Glucose, Bld: 162 mg/dL — ABNORMAL HIGH (ref 70–99)
Potassium: 3.8 mmol/L (ref 3.5–5.1)
Sodium: 141 mmol/L (ref 135–145)

## 2024-04-06 LAB — MRSA NEXT GEN BY PCR, NASAL: MRSA by PCR Next Gen: NOT DETECTED

## 2024-04-06 MED ORDER — THROMBIN 5000 UNITS EX SOLR
OROMUCOSAL | Status: DC | PRN
  Administered 2024-04-06 (×2): 5 mL via TOPICAL

## 2024-04-06 MED ORDER — ACETAMINOPHEN 10 MG/ML IV SOLN
INTRAVENOUS | Status: AC
  Filled 2024-04-06: qty 100

## 2024-04-06 MED ORDER — OXYCODONE-ACETAMINOPHEN 5-325 MG PO TABS
1.0000 | ORAL_TABLET | Freq: Four times a day (QID) | ORAL | Status: DC | PRN
  Administered 2024-04-06 – 2024-04-07 (×2): 1 via ORAL
  Filled 2024-04-06 (×2): qty 1

## 2024-04-06 MED ORDER — THROMBIN 20000 UNITS EX SOLR
CUTANEOUS | Status: AC
  Filled 2024-04-06: qty 20000

## 2024-04-06 MED ORDER — FENTANYL CITRATE (PF) 100 MCG/2ML IJ SOLN
INTRAMUSCULAR | Status: AC
  Filled 2024-04-06: qty 2

## 2024-04-06 MED ORDER — LABETALOL HCL 5 MG/ML IV SOLN: 10.0000 mg | INTRAVENOUS | Status: DC | PRN

## 2024-04-06 MED ORDER — AMLODIPINE BESYLATE 5 MG PO TABS: 5.0000 mg | ORAL_TABLET | Freq: Every day | ORAL | Status: DC

## 2024-04-06 MED ORDER — INSULIN ASPART 100 UNIT/ML IJ SOLN
0.0000 [IU] | INTRAMUSCULAR | Status: DC | PRN
  Administered 2024-04-06: 2 [IU] via SUBCUTANEOUS
  Filled 2024-04-06: qty 2
  Filled 2024-04-06: qty 0.14

## 2024-04-06 MED ORDER — PROPOFOL 10 MG/ML IV BOLUS
INTRAVENOUS | Status: AC
  Filled 2024-04-06: qty 20

## 2024-04-06 MED ORDER — SUGAMMADEX SODIUM 200 MG/2ML IV SOLN
INTRAVENOUS | Status: DC | PRN
  Administered 2024-04-06: 200 mg via INTRAVENOUS
  Administered 2024-04-06: 100 mg via INTRAVENOUS

## 2024-04-06 MED ORDER — CHLORHEXIDINE GLUCONATE CLOTH 2 % EX PADS: 6.0000 | MEDICATED_PAD | Freq: Once | CUTANEOUS | Status: DC

## 2024-04-06 MED ORDER — HYDRALAZINE HCL 20 MG/ML IJ SOLN
5.0000 mg | Freq: Once | INTRAMUSCULAR | Status: AC
  Administered 2024-04-06: 5 mg via INTRAVENOUS

## 2024-04-06 MED ORDER — LEVETIRACETAM 500 MG/5ML IV SOLN
INTRAVENOUS | Status: AC
  Filled 2024-04-06: qty 10

## 2024-04-06 MED ORDER — LISINOPRIL 20 MG PO TABS
40.0000 mg | ORAL_TABLET | Freq: Every day | ORAL | Status: DC
  Administered 2024-04-07 – 2024-04-08 (×2): 40 mg via ORAL
  Filled 2024-04-06 (×4): qty 2

## 2024-04-06 MED ORDER — LIDOCAINE-EPINEPHRINE 1 %-1:100000 IJ SOLN
INTRAMUSCULAR | Status: AC
  Filled 2024-04-06: qty 1

## 2024-04-06 MED ORDER — 0.9 % SODIUM CHLORIDE (POUR BTL) OPTIME
TOPICAL | Status: DC | PRN
  Administered 2024-04-06: 3000 mL

## 2024-04-06 MED ORDER — CHLORHEXIDINE GLUCONATE 0.12 % MT SOLN: 15.0000 mL | Freq: Once | OROMUCOSAL | Status: DC

## 2024-04-06 MED ORDER — OXYCODONE HCL 5 MG PO TABS
5.0000 mg | ORAL_TABLET | Freq: Four times a day (QID) | ORAL | Status: DC | PRN
  Administered 2024-04-06 – 2024-04-07 (×2): 5 mg via ORAL
  Filled 2024-04-06 (×2): qty 1

## 2024-04-06 MED ORDER — OXYCODONE HCL 5 MG/5ML PO SOLN: 5.0000 mg | Freq: Once | ORAL | Status: AC | PRN

## 2024-04-06 MED ORDER — LABETALOL HCL 5 MG/ML IV SOLN
INTRAVENOUS | Status: DC | PRN
  Administered 2024-04-06 (×2): 5 mg via INTRAVENOUS

## 2024-04-06 MED ORDER — BISACODYL 10 MG RE SUPP: 10.0000 mg | Freq: Every day | RECTAL | Status: DC | PRN

## 2024-04-06 MED ORDER — DOCUSATE SODIUM 100 MG PO CAPS
100.0000 mg | ORAL_CAPSULE | Freq: Two times a day (BID) | ORAL | Status: DC
  Administered 2024-04-07 – 2024-04-08 (×2): 100 mg via ORAL
  Filled 2024-04-06 (×4): qty 1

## 2024-04-06 MED ORDER — CLEVIDIPINE BUTYRATE 0.5 MG/ML IV EMUL
0.0000 mg/h | INTRAVENOUS | Status: DC
  Administered 2024-04-06: 2 mg/h via INTRAVENOUS

## 2024-04-06 MED ORDER — SODIUM CHLORIDE 0.9 % IV SOLN: INTRAVENOUS | Status: DC | PRN

## 2024-04-06 MED ORDER — INSULIN ASPART 100 UNIT/ML IJ SOLN
INTRAMUSCULAR | Status: AC
  Filled 2024-04-06: qty 4

## 2024-04-06 MED ORDER — HYDRALAZINE HCL 20 MG/ML IJ SOLN
INTRAMUSCULAR | Status: AC
  Filled 2024-04-06: qty 1

## 2024-04-06 MED ORDER — ORAL CARE MOUTH RINSE: 15.0000 mL | Freq: Once | OROMUCOSAL | Status: AC

## 2024-04-06 MED ORDER — CHLORHEXIDINE GLUCONATE 0.12 % MT SOLN
OROMUCOSAL | Status: AC
  Administered 2024-04-06: 15 mL via OROMUCOSAL
  Filled 2024-04-06: qty 15

## 2024-04-06 MED ORDER — FUROSEMIDE 20 MG PO TABS
20.0000 mg | ORAL_TABLET | Freq: Every day | ORAL | Status: DC
  Administered 2024-04-07 – 2024-04-08 (×2): 20 mg via ORAL
  Filled 2024-04-06 (×4): qty 1

## 2024-04-06 MED ORDER — BACITRACIN ZINC 500 UNIT/GM EX OINT
TOPICAL_OINTMENT | CUTANEOUS | Status: DC | PRN
  Administered 2024-04-06: 1 via TOPICAL

## 2024-04-06 MED ORDER — THROMBIN 20000 UNITS EX SOLR
CUTANEOUS | Status: DC | PRN
  Administered 2024-04-06: 20 mL via TOPICAL

## 2024-04-06 MED ORDER — THROMBIN 5000 UNITS EX KIT
PACK | CUTANEOUS | Status: AC
  Filled 2024-04-06: qty 1

## 2024-04-06 MED ORDER — LIDOCAINE-EPINEPHRINE 1 %-1:100000 IJ SOLN
INTRAMUSCULAR | Status: DC | PRN
  Administered 2024-04-06: 10 mL

## 2024-04-06 MED ORDER — LEVETIRACETAM (KEPPRA) 500 MG/5 ML ADULT IV PUSH
1000.0000 mg | Freq: Once | INTRAVENOUS | Status: AC
  Administered 2024-04-06: 1000 mg via INTRAVENOUS
  Filled 2024-04-06: qty 10

## 2024-04-06 MED ORDER — ROCURONIUM BROMIDE 10 MG/ML (PF) SYRINGE
PREFILLED_SYRINGE | INTRAVENOUS | Status: AC
  Filled 2024-04-06: qty 10

## 2024-04-06 MED ORDER — POLYETHYLENE GLYCOL 3350 17 G PO PACK: 17.0000 g | PACK | Freq: Every day | ORAL | Status: DC | PRN

## 2024-04-06 MED ORDER — ONDANSETRON HCL 4 MG PO TABS: 4.0000 mg | ORAL_TABLET | ORAL | Status: DC | PRN

## 2024-04-06 MED ORDER — DROPERIDOL 2.5 MG/ML IJ SOLN: 0.6250 mg | Freq: Once | INTRAMUSCULAR | Status: DC | PRN

## 2024-04-06 MED ORDER — ROCURONIUM BROMIDE 10 MG/ML (PF) SYRINGE
PREFILLED_SYRINGE | INTRAVENOUS | Status: DC | PRN
  Administered 2024-04-06: 20 mg via INTRAVENOUS
  Administered 2024-04-06: 80 mg via INTRAVENOUS

## 2024-04-06 MED ORDER — THROMBIN (RECOMBINANT) 20000 UNITS EX SOLR
CUTANEOUS | Status: AC
  Filled 2024-04-06: qty 20000

## 2024-04-06 MED ORDER — CYCLOBENZAPRINE HCL 10 MG PO TABS
10.0000 mg | ORAL_TABLET | Freq: Three times a day (TID) | ORAL | Status: DC
  Administered 2024-04-06 – 2024-04-08 (×5): 10 mg via ORAL
  Filled 2024-04-06 (×5): qty 1

## 2024-04-06 MED ORDER — FENTANYL CITRATE (PF) 50 MCG/ML IJ SOSY
25.0000 ug | PREFILLED_SYRINGE | INTRAMUSCULAR | Status: DC | PRN
  Administered 2024-04-06 – 2024-04-08 (×5): 25 ug via INTRAVENOUS
  Filled 2024-04-06 (×5): qty 1

## 2024-04-06 MED ORDER — DEXMEDETOMIDINE HCL IN NACL 80 MCG/20ML IV SOLN
INTRAVENOUS | Status: DC | PRN
  Administered 2024-04-06: 12 ug via INTRAVENOUS
  Administered 2024-04-06: 8 ug via INTRAVENOUS

## 2024-04-06 MED ORDER — PROMETHAZINE HCL 25 MG PO TABS
12.5000 mg | ORAL_TABLET | ORAL | Status: DC | PRN
  Administered 2024-04-06: 25 mg via ORAL
  Filled 2024-04-06: qty 1

## 2024-04-06 MED ORDER — INSULIN ASPART 100 UNIT/ML IJ SOLN
0.0000 [IU] | INTRAMUSCULAR | Status: DC
  Administered 2024-04-06 – 2024-04-07 (×2): 3 [IU] via SUBCUTANEOUS
  Administered 2024-04-07: 2 [IU] via SUBCUTANEOUS
  Administered 2024-04-07 (×2): 3 [IU] via SUBCUTANEOUS
  Administered 2024-04-07: 2 [IU] via SUBCUTANEOUS
  Administered 2024-04-07: 3 [IU] via SUBCUTANEOUS
  Administered 2024-04-08 (×2): 2 [IU] via SUBCUTANEOUS
  Filled 2024-04-06: qty 2
  Filled 2024-04-06: qty 5
  Filled 2024-04-06 (×3): qty 2
  Filled 2024-04-06 (×2): qty 3

## 2024-04-06 MED ORDER — ONDANSETRON HCL 4 MG/2ML IJ SOLN
INTRAMUSCULAR | Status: AC
  Filled 2024-04-06: qty 2

## 2024-04-06 MED ORDER — SENNA 8.6 MG PO TABS
1.0000 | ORAL_TABLET | Freq: Two times a day (BID) | ORAL | Status: DC
  Administered 2024-04-07 – 2024-04-08 (×3): 8.6 mg via ORAL
  Filled 2024-04-06 (×4): qty 1

## 2024-04-06 MED ORDER — ACETAMINOPHEN 10 MG/ML IV SOLN
1000.0000 mg | Freq: Once | INTRAVENOUS | Status: DC | PRN
  Administered 2024-04-06: 1000 mg via INTRAVENOUS

## 2024-04-06 MED ORDER — FLEET ENEMA RE ENEM: 1.0000 | ENEMA | Freq: Once | RECTAL | Status: DC | PRN

## 2024-04-06 MED ORDER — BACITRACIN ZINC 500 UNIT/GM EX OINT
TOPICAL_OINTMENT | CUTANEOUS | Status: AC
  Filled 2024-04-06: qty 28.35

## 2024-04-06 MED ORDER — ORAL CARE MOUTH RINSE: 15.0000 mL | Freq: Once | OROMUCOSAL | Status: DC

## 2024-04-06 MED ORDER — CHLORHEXIDINE GLUCONATE 0.12 % MT SOLN: 15.0000 mL | Freq: Once | OROMUCOSAL | Status: AC

## 2024-04-06 MED ORDER — CLEVIDIPINE BUTYRATE 0.5 MG/ML IV EMUL
INTRAVENOUS | Status: AC
  Filled 2024-04-06: qty 50

## 2024-04-06 MED ORDER — PANTOPRAZOLE SODIUM 40 MG IV SOLR
40.0000 mg | Freq: Every day | INTRAVENOUS | Status: DC
  Administered 2024-04-06: 40 mg via INTRAVENOUS
  Filled 2024-04-06: qty 10

## 2024-04-06 MED ORDER — INSULIN ASPART 100 UNIT/ML IJ SOLN
4.0000 [IU] | Freq: Once | INTRAMUSCULAR | Status: AC
  Administered 2024-04-06: 4 [IU] via SUBCUTANEOUS

## 2024-04-06 MED ORDER — DEXAMETHASONE SOD PHOSPHATE PF 10 MG/ML IJ SOLN
INTRAMUSCULAR | Status: AC
  Filled 2024-04-06: qty 1

## 2024-04-06 MED ORDER — METOPROLOL TARTRATE 25 MG PO TABS
25.0000 mg | ORAL_TABLET | Freq: Two times a day (BID) | ORAL | Status: DC
  Administered 2024-04-06 – 2024-04-08 (×4): 25 mg via ORAL
  Filled 2024-04-06 (×5): qty 1

## 2024-04-06 MED ORDER — EPHEDRINE SULFATE-NACL 50-0.9 MG/10ML-% IV SOSY
PREFILLED_SYRINGE | INTRAVENOUS | Status: DC | PRN
  Administered 2024-04-06: 5 mg via INTRAVENOUS

## 2024-04-06 MED ORDER — OXYCODONE HCL 5 MG PO TABS
5.0000 mg | ORAL_TABLET | Freq: Once | ORAL | Status: AC | PRN
  Administered 2024-04-06: 5 mg via ORAL

## 2024-04-06 MED ORDER — PROPOFOL 10 MG/ML IV BOLUS
INTRAVENOUS | Status: DC | PRN
  Administered 2024-04-06: 100 mg via INTRAVENOUS
  Administered 2024-04-06 (×2): 50 mg via INTRAVENOUS

## 2024-04-06 MED ORDER — ONDANSETRON HCL 4 MG/2ML IJ SOLN
INTRAMUSCULAR | Status: DC | PRN
  Administered 2024-04-06: 4 mg via INTRAVENOUS

## 2024-04-06 MED ORDER — MORPHINE SULFATE (PF) 2 MG/ML IV SOLN: 1.0000 mg | INTRAVENOUS | Status: DC | PRN

## 2024-04-06 MED ORDER — FENTANYL CITRATE (PF) 250 MCG/5ML IJ SOLN
INTRAMUSCULAR | Status: DC | PRN
  Administered 2024-04-06 (×4): 50 ug via INTRAVENOUS

## 2024-04-06 MED ORDER — TRAZODONE HCL 50 MG PO TABS
100.0000 mg | ORAL_TABLET | Freq: Every day | ORAL | Status: DC
  Administered 2024-04-06 – 2024-04-07 (×2): 100 mg via ORAL
  Filled 2024-04-06 (×2): qty 2

## 2024-04-06 MED ORDER — ONDANSETRON HCL 4 MG/2ML IJ SOLN: 4.0000 mg | INTRAMUSCULAR | Status: DC | PRN

## 2024-04-06 MED ORDER — BUPIVACAINE HCL (PF) 0.5 % IJ SOLN
INTRAMUSCULAR | Status: DC | PRN
  Administered 2024-04-06: 10 mL

## 2024-04-06 MED ORDER — PHENYLEPHRINE HCL-NACL 20-0.9 MG/250ML-% IV SOLN
INTRAVENOUS | Status: DC | PRN
  Administered 2024-04-06: 20 ug/min via INTRAVENOUS

## 2024-04-06 MED ORDER — LACTATED RINGERS IV SOLN: INTRAVENOUS | Status: DC

## 2024-04-06 MED ORDER — CHLORHEXIDINE GLUCONATE CLOTH 2 % EX PADS
6.0000 | MEDICATED_PAD | Freq: Every day | CUTANEOUS | Status: DC
  Administered 2024-04-06 – 2024-04-08 (×3): 6 via TOPICAL

## 2024-04-06 MED ORDER — EPHEDRINE 5 MG/ML INJ
INTRAVENOUS | Status: AC
  Filled 2024-04-06: qty 5

## 2024-04-06 MED ORDER — PHENYLEPHRINE 80 MCG/ML (10ML) SYRINGE FOR IV PUSH (FOR BLOOD PRESSURE SUPPORT)
PREFILLED_SYRINGE | INTRAVENOUS | Status: DC | PRN
  Administered 2024-04-06: 40 ug via INTRAVENOUS

## 2024-04-06 MED ORDER — CEFAZOLIN SODIUM-DEXTROSE 2-4 GM/100ML-% IV SOLN
INTRAVENOUS | Status: AC
  Filled 2024-04-06: qty 100

## 2024-04-06 MED ORDER — CYCLOBENZAPRINE HCL 10 MG PO TABS: 10.0000 mg | ORAL_TABLET | Freq: Three times a day (TID) | ORAL | 0 refills | Status: AC

## 2024-04-06 MED ORDER — ALPRAZOLAM 0.5 MG PO TABS: 1.0000 mg | ORAL_TABLET | Freq: Three times a day (TID) | ORAL | Status: DC

## 2024-04-06 MED ORDER — AMLODIPINE BESYLATE 5 MG PO TABS
5.0000 mg | ORAL_TABLET | Freq: Every day | ORAL | Status: DC
  Administered 2024-04-06 – 2024-04-07 (×2): 5 mg via ORAL
  Filled 2024-04-06 (×2): qty 1

## 2024-04-06 MED ORDER — DEXAMETHASONE SOD PHOSPHATE PF 10 MG/ML IJ SOLN
INTRAMUSCULAR | Status: DC | PRN
  Administered 2024-04-06: 5 mg via INTRAVENOUS

## 2024-04-06 MED ORDER — ALPRAZOLAM 0.5 MG PO TABS
0.5000 mg | ORAL_TABLET | Freq: Three times a day (TID) | ORAL | Status: DC
  Administered 2024-04-06 – 2024-04-08 (×5): 0.5 mg via ORAL
  Filled 2024-04-06 (×5): qty 1

## 2024-04-06 MED ORDER — FENTANYL CITRATE (PF) 100 MCG/2ML IJ SOLN
25.0000 ug | INTRAMUSCULAR | Status: DC | PRN
  Administered 2024-04-06: 50 ug via INTRAVENOUS
  Administered 2024-04-06 (×2): 25 ug via INTRAVENOUS

## 2024-04-06 MED ORDER — PANTOPRAZOLE SODIUM 40 MG PO TBEC: 40.0000 mg | DELAYED_RELEASE_TABLET | Freq: Every day | ORAL | Status: DC

## 2024-04-06 MED ORDER — LEVETIRACETAM (KEPPRA) 500 MG/5 ML ADULT IV PUSH
500.0000 mg | Freq: Two times a day (BID) | INTRAVENOUS | Status: DC
  Administered 2024-04-06 – 2024-04-07 (×2): 500 mg via INTRAVENOUS
  Filled 2024-04-06 (×2): qty 5

## 2024-04-06 MED ORDER — ROSUVASTATIN CALCIUM 20 MG PO TABS
40.0000 mg | ORAL_TABLET | Freq: Every day | ORAL | Status: DC
  Administered 2024-04-07 – 2024-04-08 (×2): 40 mg via ORAL
  Filled 2024-04-06 (×2): qty 2

## 2024-04-06 MED ORDER — CEFAZOLIN SODIUM-DEXTROSE 2-4 GM/100ML-% IV SOLN
2.0000 g | INTRAVENOUS | Status: AC
  Administered 2024-04-06: 2 g via INTRAVENOUS

## 2024-04-06 MED ORDER — BUPIVACAINE HCL (PF) 0.5 % IJ SOLN
INTRAMUSCULAR | Status: AC
  Filled 2024-04-06: qty 30

## 2024-04-06 MED ORDER — PHENYLEPHRINE 80 MCG/ML (10ML) SYRINGE FOR IV PUSH (FOR BLOOD PRESSURE SUPPORT)
PREFILLED_SYRINGE | INTRAVENOUS | Status: AC
  Filled 2024-04-06: qty 10

## 2024-04-06 MED ORDER — LIDOCAINE 2% (20 MG/ML) 5 ML SYRINGE
INTRAMUSCULAR | Status: AC
  Filled 2024-04-06: qty 5

## 2024-04-06 MED ORDER — LIDOCAINE 2% (20 MG/ML) 5 ML SYRINGE
INTRAMUSCULAR | Status: DC | PRN
  Administered 2024-04-06: 60 mg via INTRAVENOUS

## 2024-04-06 MED ORDER — SODIUM CHLORIDE 0.9 % IV SOLN
0.1500 ug/kg/min | INTRAVENOUS | Status: AC
  Administered 2024-04-06: .1 ug/kg/min via INTRAVENOUS
  Filled 2024-04-06: qty 2000

## 2024-04-06 MED ORDER — DEXMEDETOMIDINE HCL IN NACL 80 MCG/20ML IV SOLN
INTRAVENOUS | Status: AC
  Filled 2024-04-06: qty 20

## 2024-04-06 MED ORDER — INSULIN ASPART 100 UNIT/ML IJ SOLN: 0.0000 [IU] | Freq: Three times a day (TID) | INTRAMUSCULAR | Status: DC

## 2024-04-06 NOTE — Anesthesia Postprocedure Evaluation (Signed)
"   Anesthesia Post Note  Patient: Shannon Stuart  Procedure(s) Performed: CRANIOTOMY TUMOR EXCISION (Right) COMPUTER-ASSISTED NAVIGATION, FOR CRANIAL PROCEDURE (Right)     Patient location during evaluation: PACU Anesthesia Type: General Level of consciousness: awake and alert Pain management: pain level controlled Vital Signs Assessment: post-procedure vital signs reviewed and stable Respiratory status: spontaneous breathing, nonlabored ventilation, respiratory function stable and patient connected to nasal cannula oxygen Cardiovascular status: blood pressure returned to baseline and stable Postop Assessment: no apparent nausea or vomiting Anesthetic complications: no   There were no known notable events for this encounter.  Last Vitals:  Vitals:   04/06/24 1145 04/06/24 1200  BP: (!) 146/98 (!) 140/98  Pulse: 68 71  Resp: 11 (!) 36  Temp:    SpO2: 98% 100%    Last Pain:  Vitals:   04/06/24 1130  TempSrc:   PainSc: Asleep                 Thom JONELLE Peoples      "

## 2024-04-06 NOTE — Anesthesia Procedure Notes (Signed)
 Arterial Line Insertion Start/End1/13/2026 7:10 AM, 04/06/2024 7:15 AM Performed by: Erma Thom SAUNDERS, MD, Zelphia Norleen HERO, CRNA, CRNA  Patient location: Pre-op. Preanesthetic checklist: patient identified, IV checked, site marked, risks and benefits discussed, surgical consent, monitors and equipment checked, pre-op evaluation, timeout performed and anesthesia consent Lidocaine  1% used for infiltration Left, radial was placed Catheter size: 20 G Hand hygiene performed , maximum sterile barriers used  and Seldinger technique used Allen's test indicative of satisfactory collateral circulation Attempts: 1 Procedure performed using ultrasound to evaluate access site. Ultrasound Notes:relevant anatomy identified, ultrasound used to visualize needle entry and vessel patent under ultrasound. Following insertion, dressing applied. Post procedure assessment: normal and unchanged  Patient tolerated the procedure well with no immediate complications.
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
## 2024-04-06 NOTE — Op Note (Signed)
 Date of surgery: 04/06/2024 Preoperative diagnosis: Right frontal meningioma Postoperative diagnosis: Same Procedure: Right frontal craniotomy with gross total resection of meningioma with Stealth image guidance Surgeon: Victory Gens First Assistant: Lanis MD Anesthesia: General endotracheal Indications: Shannon Stuart is a 64 year old patient who I have treated for over 30 years.  About 30 years ago she presented with a pituitary adenoma which was resected via transsphenoidal process.  She tolerated that surgery well but on follow-up studies it was noted that she had a small meningioma in the floor of the frontal fossa posteriorly of the sphenoid.  We did follow this for a number of years but recently has shown signs of significant enlargement now measuring 17 x 15 x 14 mm in size.  I discussed the fact that it is growing and could still be monitored however the patient desired to have it removed as she has been having increasing symptoms of headache.  Procedure: The patient was brought to the operating room placed on the table in supine position.  After the smooth induction of general endotracheal anesthesia she was placed in the 3 pin headrest with the head slightly turned to the left.  I shaved the area of the right frontal scalp.  The Stealth registration arc was applied and the patient's head was registered to the Stealth station with the MRI being used as guidance images.  Once the registration was complete and landmarks were checked there is noted the nature of the approach to the right frontal region.  I drew out a standard right frontal craniotomy scalp flap after prepping the skin with alcohol DuraPrep and draping in a sterile fashion.  The skin was opened down through the galea and the temporalis muscle and fascia was separated and drawn forward.  The underlying bone was noted in the area of the keyhole was identified and isolated.  Skin fishhooks were used to maintain traction on the scalp  anteriorly.  Then a singular bur hole was placed in the posterior frontal region the dura was identified and then router attachment was used to raise a standard right frontal bone flap based on the keyhole.  The area of lateral sphenoid wing was drilled through bluntly using a high-speed bur.  Then the bone was cracked off of the sphenoid and the bone flap was elevated.  Portion of the dura was noted to be lacerated in the frontal region.  The dural opening was then completed and flapped inferiorly after rongeuring some of the lateral sphenoid wing in the frontal prominence.  With the dura being reflected the brain was noted be slack and spinal fluid was easily evacuated from the subarachnoid space allowing for placement of a retractor.  This was done with the Luneau retractor system which was used to retract the frontal lobe.  As a gradual retraction was placed I could see the edge of the tumor in the posterior frontal region.  A second retractor was then placed on the anterior border to allow further exposure of the lesion.  The lesion was densely adherent to the inferior dura and this was carefully cauterized with the bipolar cautery and released in pieces.  Then the tumor itself was resected from the underlying brain.  This was done very cautious piecemeal fashion with direct loupe visualization.  Once an adequate portion of the tumor could be visualized several biopsies from it were obtained but then ultimately we were able to resect the tumor from its cavity and a singular piece.  The area was then  inspected some venous bleeding from the pia was controlled with the bipolar cautery and some small pledgets of Gelfoam which were later irrigated away once good hemostasis was obtained the dura was reflected into its original position and closed over a large piece of DuraGen anteriorly.  DuraGen was tacked up to the dural edges.  The bone flap was replaced and secured with a singular bur hole cover and 3 separate  dog bone plates.  The galea was then reflected with the temporalis muscle and fascia being sewn together with 2-0 Vicryl in interrupted fashion and then 2-0 Vicryl was used to close the galea separately surgical staples were placed in the scalp.  The anterior most portion of the incision and the inferior most portion of the incision were closed with 4-0 nylon in interrupted fashion.  Blood loss for the procedure was estimated at 75 cc.  Patient was removed from the 3 pin headrest a dry sterile dressing was applied she was returned to recovery room in stable condition.
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
## 2024-04-06 NOTE — Transfer of Care (Signed)
 Immediate Anesthesia Transfer of Care Note  Patient: Shannon Stuart  Procedure(s) Performed: CRANIOTOMY TUMOR EXCISION (Right) COMPUTER-ASSISTED NAVIGATION, FOR CRANIAL PROCEDURE (Right)  Patient Location: PACU  Anesthesia Type:General  Level of Consciousness: awake and confused  Airway & Oxygen Therapy: Patient connected to nasal cannula oxygen  Post-op Assessment: Report given to RN, Post -op Vital signs reviewed and stable, and Patient moving all extremities X 4  Post vital signs: Reviewed and stable  Last Vitals:  Vitals Value Taken Time  BP 140/87 04/06/24 11:00  Temp    Pulse 75 04/06/24 11:13  Resp 14 04/06/24 11:13  SpO2 96 % 04/06/24 11:13  Vitals shown include unfiled device data.  Last Pain:  Vitals:   04/06/24 0618  TempSrc:   PainSc: 5       Patients Stated Pain Goal: 3 (04/06/24 9381)  Complications: There were no known notable events for this encounter.

## 2024-04-06 NOTE — Plan of Care (Signed)
" °  Problem: Nutritional: Goal: Will attain and maintain optimal nutritional status Outcome: Progressing   Problem: Neurological: Goal: Will regain or maintain usual level of consciousness Outcome: Progressing   Problem: Skin Integrity: Goal: Demonstrates signs of wound healing without infection Outcome: Progressing   Problem: Education: Goal: Knowledge of General Education information will improve Description: Including pain rating scale, medication(s)/side effects and non-pharmacologic comfort measures Outcome: Progressing   Problem: Health Behavior/Discharge Planning: Goal: Ability to manage health-related needs will improve Outcome: Progressing   Problem: Clinical Measurements: Goal: Respiratory complications will improve Outcome: Progressing Goal: Cardiovascular complication will be avoided Outcome: Progressing   Problem: Activity: Goal: Risk for activity intolerance will decrease Outcome: Progressing   Problem: Nutrition: Goal: Adequate nutrition will be maintained Outcome: Progressing   "

## 2024-04-06 NOTE — Anesthesia Procedure Notes (Signed)
 Procedure Name: Intubation Date/Time: 04/06/2024 7:51 AM  Performed by: Christopher Comings, CRNAPre-anesthesia Checklist: Patient identified, Emergency Drugs available, Suction available and Patient being monitored Patient Re-evaluated:Patient Re-evaluated prior to induction Oxygen Delivery Method: Circle system utilized Preoxygenation: Pre-oxygenation with 100% oxygen Induction Type: IV induction Ventilation: Mask ventilation without difficulty Laryngoscope Size: Mac and 4 Grade View: Grade I Tube type: Oral Tube size: 7.0 mm Number of attempts: 1 Airway Equipment and Method: Stylet and Oral airway Placement Confirmation: ETT inserted through vocal cords under direct vision, positive ETCO2 and breath sounds checked- equal and bilateral Secured at: 23 cm Tube secured with: Tape Dental Injury: Teeth and Oropharynx as per pre-operative assessment

## 2024-04-07 DIAGNOSIS — E119 Type 2 diabetes mellitus without complications: Secondary | ICD-10-CM | POA: Diagnosis not present

## 2024-04-07 DIAGNOSIS — I10 Essential (primary) hypertension: Secondary | ICD-10-CM | POA: Diagnosis not present

## 2024-04-07 DIAGNOSIS — F32A Depression, unspecified: Secondary | ICD-10-CM

## 2024-04-07 DIAGNOSIS — M459 Ankylosing spondylitis of unspecified sites in spine: Secondary | ICD-10-CM

## 2024-04-07 DIAGNOSIS — G8929 Other chronic pain: Secondary | ICD-10-CM | POA: Diagnosis not present

## 2024-04-07 DIAGNOSIS — F419 Anxiety disorder, unspecified: Secondary | ICD-10-CM

## 2024-04-07 DIAGNOSIS — D72829 Elevated white blood cell count, unspecified: Secondary | ICD-10-CM | POA: Diagnosis not present

## 2024-04-07 DIAGNOSIS — E785 Hyperlipidemia, unspecified: Secondary | ICD-10-CM | POA: Diagnosis not present

## 2024-04-07 DIAGNOSIS — R252 Cramp and spasm: Secondary | ICD-10-CM | POA: Diagnosis not present

## 2024-04-07 DIAGNOSIS — D320 Benign neoplasm of cerebral meninges: Secondary | ICD-10-CM | POA: Diagnosis not present

## 2024-04-07 LAB — GLUCOSE, CAPILLARY
Glucose-Capillary: 121 mg/dL — ABNORMAL HIGH (ref 70–99)
Glucose-Capillary: 130 mg/dL — ABNORMAL HIGH (ref 70–99)
Glucose-Capillary: 141 mg/dL — ABNORMAL HIGH (ref 70–99)
Glucose-Capillary: 151 mg/dL — ABNORMAL HIGH (ref 70–99)
Glucose-Capillary: 155 mg/dL — ABNORMAL HIGH (ref 70–99)
Glucose-Capillary: 173 mg/dL — ABNORMAL HIGH (ref 70–99)
Glucose-Capillary: 173 mg/dL — ABNORMAL HIGH (ref 70–99)

## 2024-04-07 LAB — CBC
HCT: 39.2 % (ref 36.0–46.0)
Hemoglobin: 12.9 g/dL (ref 12.0–15.0)
MCH: 29.7 pg (ref 26.0–34.0)
MCHC: 32.9 g/dL (ref 30.0–36.0)
MCV: 90.3 fL (ref 80.0–100.0)
Platelets: 220 K/uL (ref 150–400)
RBC: 4.34 MIL/uL (ref 3.87–5.11)
RDW: 16.4 % — ABNORMAL HIGH (ref 11.5–15.5)
WBC: 20 K/uL — ABNORMAL HIGH (ref 4.0–10.5)
nRBC: 0 % (ref 0.0–0.2)

## 2024-04-07 MED ORDER — LEVETIRACETAM 500 MG PO TABS
500.0000 mg | ORAL_TABLET | Freq: Two times a day (BID) | ORAL | Status: DC
  Administered 2024-04-07 – 2024-04-08 (×2): 500 mg via ORAL
  Filled 2024-04-07 (×2): qty 1

## 2024-04-07 MED ORDER — ACETAMINOPHEN 500 MG PO TABS
1000.0000 mg | ORAL_TABLET | Freq: Three times a day (TID) | ORAL | Status: DC
  Administered 2024-04-07 – 2024-04-08 (×4): 1000 mg via ORAL
  Filled 2024-04-07 (×4): qty 2

## 2024-04-07 MED ORDER — MELATONIN 3 MG PO TABS: 3.0000 mg | ORAL_TABLET | Freq: Every evening | ORAL | Status: DC | PRN

## 2024-04-07 MED ORDER — BUTALBITAL-APAP-CAFFEINE 50-325-40 MG PO TABS
1.0000 | ORAL_TABLET | Freq: Four times a day (QID) | ORAL | Status: DC | PRN
  Administered 2024-04-07: 1 via ORAL
  Filled 2024-04-07: qty 1

## 2024-04-07 MED ORDER — PANTOPRAZOLE SODIUM 40 MG PO TBEC
40.0000 mg | DELAYED_RELEASE_TABLET | Freq: Every day | ORAL | Status: DC
  Administered 2024-04-07: 40 mg via ORAL
  Filled 2024-04-07: qty 1

## 2024-04-07 MED ORDER — OXYCODONE HCL 5 MG PO TABS
5.0000 mg | ORAL_TABLET | ORAL | Status: DC | PRN
  Administered 2024-04-07 – 2024-04-08 (×4): 5 mg via ORAL
  Filled 2024-04-07 (×4): qty 1

## 2024-04-07 MED FILL — Thrombin For Soln 5000 Unit: CUTANEOUS | Qty: 5000 | Status: AC

## 2024-04-07 NOTE — TOC Initial Note (Signed)
 Transition of [facility]) - Initial/Assessment Note    Patient Details  Name: Shannon Stuart MRN: [id number] Date of Birth: 05-Mar-1961  [facility] CM/SW Contact:    Shannon Spaugh E Zamyra Allensworth, LCSW Phone Number: 04/07/2024, 2:55 PM  Clinical Narrative:                 Patient admitted for neuro procedure. SDOH flagged for food and housing - resources have been provided. PT/OT evals are pending - will follow for their recommendations.    Barriers to Discharge: Continued Medical Work up   Patient Goals and CMS Choice            Expected Discharge Plan and Services                                              Prior Living Arrangements/Services     Patient language and need for interpreter reviewed:: Yes        Need for Family Participation in Patient Care: Yes (Comment) Care giver support Stuart in place?: Yes (comment)   Criminal Activity/Legal Involvement Pertinent to Current Situation/Hospitalization: No - Comment as needed  Activities of Daily Living      Permission Sought/Granted                  Emotional Assessment         Alcohol / Substance Use: Not Applicable Psych Involvement: No (comment)  Admission diagnosis:  Benign neoplasm of meninges (HCC) [D32.9] Status post craniotomy [Z98.890] Meningioma, cerebral (HCC) [D32.0] Patient Active Problem List   Diagnosis Date Noted   Status post craniotomy 04/06/2024   Meningioma, cerebral (HCC) 04/06/2024   Chondromalacia of patellofemoral joint 01/17/2024   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   Primary osteoarthritis of right knee 02/06/2023   Family history of heart disease 02/05/2023   Marijuana smoker 02/05/2023   Osteoarthritis, multiple sites 02/22/2022   Chronic pain 12/05/2021   Hearing loss, left 11/19/2021   Hx of bilateral hip replacements 05/29/2021   Sleep apnea 05/17/2021   Meningioma (HCC) 06/07/2020   Preventative health care 06/07/2020   Rotator cuff syndrome of  both shoulders    Non-obstructive CAD    Obesity, Class II, BMI 35-39.9    Low back pain potentially associated with radiculopathy 10/12/2013   Sebaceous cyst 08/18/2013   Prediabetes 06/02/2013   Insomnia 08/18/2012   Bilateral lower extremity edema 09/24/2010   HLD (hyperlipidemia) 08/04/2009   Anxiety with depression 09/15/2006   GERD 09/08/2006   Benign hypertension 08/04/2006   PCP:  Shannon Camie CROME, MD Pharmacy:   [facility] [id number] [place], [place] - [address] [name] [address] Phone: [phone] Fax: [phone]  [facility] - [place], [place] - [address] [place] [address] Phone: [phone] Fax: [phone]     Social Drivers of Health (SDOH) Social History: SDOH Screenings   Food Insecurity: Low Risk (02/28/2024)   Received from [facility]  Recent Concern: Food Insecurity - Food Insecurity Present (02/12/2024)  Housing: High Risk (02/12/2024)  Transportation Needs: No Transportation Needs (02/12/2024)  Utilities: Low Risk (02/28/2024)   Received from [facility]  Alcohol Screen: Low Risk (01/20/2024)  Depression (PHQ2-9): Low Risk (02/12/2024)  Financial Resource Strain: High Risk (01/20/2024)  Physical Activity: Insufficiently Active (02/12/2024)  Social Connections: Unknown (  02/12/2024)  Stress: No Stress Concern Present (02/12/2024)  Tobacco Use: Medium Risk (04/06/2024)  Health Literacy: Adequate Health Literacy (02/12/2024)   SDOH Interventions:     Readmission Risk Interventions     No data to display
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
## 2024-04-07 NOTE — Progress Notes (Signed)
 Patient ID: Shannon Stuart, female   DOB: 1960-09-21, 64 y.o.   MRN: [id number] Vital signs are stable Patient has significant swelling over right eye Cannot open the right eye at this time Incision remains dry mostly with some bleeding in the posterior limb of the dressing.  Will continue to monitor in the hospital today.  Patient has not ambulated will need to get up and move about.  CT scan last night shows typical hemorrhage at the site of resection but other than some intracranial air no complicating features are noted.

## 2024-04-07 NOTE — Discharge Instructions (Signed)

## 2024-04-08 ENCOUNTER — Encounter (HOSPITAL_COMMUNITY): Payer: Self-pay | Admitting: Neurological Surgery

## 2024-04-08 LAB — GLUCOSE, CAPILLARY
Glucose-Capillary: 144 mg/dL — ABNORMAL HIGH (ref 70–99)
Glucose-Capillary: 133 mg/dL — ABNORMAL HIGH (ref 70–99)
Glucose-Capillary: 131 mg/dL — ABNORMAL HIGH (ref 70–99)

## 2024-04-08 LAB — CBC
HCT: 35.2 % — ABNORMAL LOW (ref 36.0–46.0)
Hemoglobin: 11.4 g/dL — ABNORMAL LOW (ref 12.0–15.0)
MCH: 29.9 pg (ref 26.0–34.0)
MCHC: 32.4 g/dL (ref 30.0–36.0)
MCV: 92.4 fL (ref 80.0–100.0)
Platelets: 182 K/uL (ref 150–400)
RBC: 3.81 MIL/uL — ABNORMAL LOW (ref 3.87–5.11)
RDW: 16.5 % — ABNORMAL HIGH (ref 11.5–15.5)
WBC: 13.6 K/uL — ABNORMAL HIGH (ref 4.0–10.5)
nRBC: 0 % (ref 0.0–0.2)

## 2024-04-08 LAB — SURGICAL PATHOLOGY

## 2024-04-08 MED ORDER — OXYCODONE-ACETAMINOPHEN 10-325 MG PO TABS: 1.0000 | ORAL_TABLET | Freq: Three times a day (TID) | ORAL | 0 refills | Status: DC | PRN

## 2024-04-08 NOTE — Progress Notes (Signed)
 Patient ID: Shannon Stuart, female   DOB: [birth year], 64 y.o.   MRN: [id number] Vital signs are stable Patient is feeling better and the swelling around her right eye is substantially improved Motor function remains stable Dressing was changed and dry honeycomb dressing was reapplied.  Patient is ready to go home and we will discharge her today.

## 2024-04-08 NOTE — Evaluation (Signed)
 Occupational Therapy Evaluation Patient Details Name: Shannon Stuart MRN: [id number] DOB: 05-10-1960 Today's Date: 04/08/2024   History of Present Illness   64 yo F adm 04/06/24 with Rt posterior frontal meningioma s/p crainotomy and resection. PMHx: bil THA, HLD, anxiety, CAD, HTN     Clinical Impressions PTA patient independent and driving, working as a PCA.  Pt currently completing mobility with supervision, ADLs with supervision. She reports blurry vision, but vision improving since surgery. Pt completed short blessed test, scoring 2/28 (normal), several errors but self correcting.  Demonstrating decreased dual tasking, organization, and sequencing, some decreased recall.  Anticipate she will not need further OT services after dc, but will follow acutely to assess higher level cognition.  Will follow acutely.      If plan is discharge home, recommend the following:   A little help with bathing/dressing/bathroom;Direct supervision/assist for financial management;Direct supervision/assist for medications management;Assistance with cooking/housework;Assist for transportation     Functional Status Assessment   Patient has had a recent decline in their functional status and demonstrates the ability to make significant improvements in function in a reasonable and predictable amount of time.     Equipment Recommendations   None recommended by OT     Recommendations for Other Services         Precautions/Restrictions   Precautions Precautions: Other (comment) Recall of Precautions/Restrictions: Intact Precaution/Restrictions Comments: BP <160 Restrictions Weight Bearing Restrictions Per Provider Order: No     Mobility Bed Mobility Overal bed mobility: Independent             General bed mobility comments: returned back to bed at end of session    Transfers Overall transfer level: Needs assistance Equipment used: None Transfers: Sit to/from Stand Sit to  Stand: Supervision, Modified independent (Device/Increase time)           General transfer comment: supervision fading to modified independnece, no AD.      Balance Overall balance assessment: Mild deficits observed, not formally tested                                         ADL either performed or assessed with clinical judgement   ADL Overall ADL's : Needs assistance/impaired     Grooming: Supervision/safety;Standing           Upper Body Dressing : Set up;Sitting   Lower Body Dressing: Supervision/safety;Sit to/from stand   Toilet Transfer: Supervision/safety;Ambulation           Functional mobility during ADLs: Supervision/safety       Vision Patient Visual Report: Blurring of vision Additional Comments: pt able to scan with out difficulty, blurry vision out of R eye but per pt improving (was unable to open R eye yesterday).  Edema and bruising around eye.     Perception         Praxis         Pertinent Vitals/Pain Pain Assessment Pain Assessment: Faces Faces Pain Scale: Hurts little more Pain Location: headache, back Pain Descriptors / Indicators: Discomfort, Headache Pain Intervention(s): Limited activity within patient's tolerance, Monitored during session, Repositioned     Extremity/Trunk Assessment Upper Extremity Assessment Upper Extremity Assessment: Right hand dominant;LUE deficits/detail LUE Deficits / Details: mild weakness and decreased sensation, functional LUE Sensation: decreased light touch LUE Coordination: WNL   Lower Extremity Assessment Lower Extremity Assessment: Defer to PT evaluation  Communication Communication Communication: No apparent difficulties   Cognition Arousal: Alert Behavior During Therapy: WFL for tasks assessed/performed Cognition: Cognition impaired       Memory impairment (select all impairments): Working memory Attention impairment (select first level of impairment):  Divided attention Executive functioning impairment (select all impairments): Problem solving, Organization OT - Cognition Comments: pt oriented and following commands, does need increased trials to recall name/address in short blessed test and makes errors with sequencing during dual cognition tasks but self corrects with increased time.  pt reports not back to baseline, but close cognitively.  scored 2/28 on short blessed test.  recommend pill box test next session.                 Following commands: Intact       Cueing  General Comments   Cueing Techniques: Verbal cues      Exercises     Shoulder Instructions      Home Living Family/patient expects to be discharged to:: Private residence Living Arrangements: Other relatives Available Help at Discharge: Family;Available 24 hours/day (sister) Type of Home: Apartment Home Access: Level entry     Home Layout: One level     Bathroom Shower/Tub: Chief Strategy Officer: Standard     Home Equipment: Tub bench;Rolling Walker (2 wheels);Rollator (4 wheels);Cane - single point;BSC/3in1;Grab bars - tub/shower          Prior Functioning/Environment Prior Level of Function : Driving;Working/employed;Independent/Modified Independent             Mobility Comments: no AD ADLs 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    OT Problem List: Decreased strength;Decreased activity tolerance;Pain;Decreased cognition   OT Treatment/Interventions: Self-care/ADL training;Cognitive remediation/compensation;Patient/family education;Therapeutic activities      OT Goals(Current goals can be found in the care plan section)   Acute Rehab OT Goals Patient Stated Goal: home tomorrow OT Goal Formulation: With patient Time For Goal Achievement: 04/22/24 Potential to Achieve Goals: Good   OT Frequency:  Min 2X/week    Co-evaluation              AM-PAC OT 6 Clicks Daily Activity     Outcome Measure Help from another  person eating meals?: None Help from another person taking care of personal grooming?: A Little Help from another person toileting, which includes using toliet, bedpan, or urinal?: A Little Help from another person bathing (including washing, rinsing, drying)?: A Little Help from another person to put on and taking off regular upper body clothing?: A Little Help from another person to put on and taking off regular lower body clothing?: A Little 6 Click Score: 19   End of Session Nurse Communication: Mobility status  Activity Tolerance: Patient tolerated treatment well Patient left: in bed;with call bell/phone within reach  OT Visit Diagnosis: Pain;Other symptoms and signs involving cognitive function;Muscle weakness (generalized) (M62.81) Pain - part of body:  (headache)                Time: 9166-9141 OT Time Calculation (min): 25 min Charges:  OT General Charges $OT Visit: 1 Visit OT Evaluation $OT Eval Moderate Complexity: 1 Mod OT Treatments $Self Care/Home Management : 8-22 mins  Shannon Stuart, OT Acute Rehabilitation Services Office [phone] Secure Chat Preferred    Shannon Stuart Hope 04/08/2024, 9:44 AM

## 2024-04-08 NOTE — Discharge Summary (Signed)
 Physician Discharge Summary  Patient ID: Shannon Stuart MRN: [id number] DOB/AGE: [birth year] 65 y.o.  Admit date: 04/06/2024 Discharge date: 04/08/2024  Admission Diagnoses: Right frontal meningioma  Discharge Diagnoses: Right frontal meningioma Principal Problem:   Status post craniotomy Active Problems:   Meningioma, cerebral [facility])   Discharged Condition: good  Hospital Course: Patient was admitted to undergo craniotomy for resection of a right frontal meningioma.  She tolerated surgery well.  Consults: None  Significant Diagnostic Studies: None  Treatments: surgery: See op note  Discharge Exam: Blood pressure 104/77, pulse 87, temperature 99.4 F (37.4 C), temperature source Oral, resp. rate 16, height 5' 8 (1.727 m), weight 95.3 kg, last menstrual period 09/20/2011, SpO2 95%. Incision is clean and dry moderate swelling about the right eye.  Motor function is normal no evidence of a drift strength is normal.  Disposition: Discharge disposition: 01-Home or Self Care       Discharge Instructions     Call MD for:  redness, tenderness, or signs of infection (pain, swelling, redness, odor or green/yellow discharge around incision site)   Complete by: As directed    Call MD for:  severe uncontrolled pain   Complete by: As directed    Call MD for:  temperature >100.4   Complete by: As directed    Discharge wound care:   Complete by: As directed    Keep incision dry and covered.   Increase activity slowly   Complete by: As directed       Allergies as of 04/08/2024       Reactions   Ms Contin  [morphine ] Anaphylaxis   Zofran  [ondansetron  Hcl] Nausea And Vomiting        Medication List     TAKE these medications    ALPRAZolam  1 MG tablet Commonly known as: XANAX  TAKE 1 TABLET BY MOUTH 3 TIMES A DAY   amLODipine  5 MG tablet Commonly known as: NORVASC  Take one by mouth daily What changed:  how much to take how to take this when to take  this additional instructions   cholecalciferol 25 MCG (1000 UNIT) tablet Commonly known as: VITAMIN D3 Take 1,000 Units by mouth daily.   clotrimazole -betamethasone  cream Commonly known as: LOTRISONE  Apply 1 Application topically 2 (two) times daily. What changed:  when to take this reasons to take this   cyclobenzaprine  10 MG tablet Commonly known as: FLEXERIL  TAKE 1 TABLET THREE TIMES DAILY What changed: Another medication with the same name was added. Make sure you understand how and when to take each.   cyclobenzaprine  10 MG tablet Commonly known as: FLEXERIL  Take 1 tablet (10 mg total) by mouth 3 (three) times daily. What changed: You were already taking a medication with the same name, and this prescription was added. Make sure you understand how and when to take each.   diclofenac  Sodium 1 % Gel Commonly known as: VOLTAREN  APPLY 2 GRAMS TOPICALLY FOUR TIMES DAILY What changed: See the new instructions.   furosemide  20 MG tablet Commonly known as: LASIX  TAKE 1 TABLET EVERY DAY   ibuprofen  800 MG tablet Commonly known as: ADVIL  TAKE 1 TABLET BY MOUTH EVERY 8 HOURS AS NEEDED FOR MODERATE HIP PAIN (PAIN SCORE 4-6) What changed: See the new instructions.   lisinopril  40 MG tablet Commonly known as: ZESTRIL  Take 1 tablet (40 mg total) by mouth daily.   magnesium  oxide 400 (240 Mg) MG tablet Commonly known as: MAG-OX Take 400 mg by mouth daily.   metoprolol  tartrate 25 MG  tablet Commonly known as: LOPRESSOR  TAKE 1 TABLET BY MOUTH 2 TIMES A DAY   Multivitamin Adults 50+ Tabs Take 1 tablet by mouth daily.   OVER THE COUNTER MEDICATION Take 1 capsule by mouth daily. OTC; [name]   OVER THE COUNTER MEDICATION Take 1 capsule by mouth daily. OTC; combination product : ashwagandha-flax seed oil-sea moss-ginger   oxyCODONE -acetaminophen  10-325 MG tablet Commonly known as: PERCOCET Take 1 tablet by mouth every 8 (eight) hours as needed for pain. What changed:   when to take this reasons to take this   pantoprazole  40 MG tablet Commonly known as: PROTONIX  Take one by mouth daily   Potassium 99 MG Tabs Take 99 mg by mouth daily.   rosuvastatin  40 MG tablet Commonly known as: CRESTOR  Take one by mouth daily   senna-docusate 8.6-50 MG tablet Commonly known as: Senokot-S Take 1 tablet by mouth at bedtime.   traZODone  100 MG tablet Commonly known as: DESYREL  Take one by mouth daily What changed:  how much to take how to take this when to take this additional instructions   TRIPLE OMEGA COMPLEX PO Take 1 capsule by mouth daily.               Discharge Care Instructions  (From admission, onward)           Start     Ordered   04/08/24 0000  Discharge wound care:       Comments: Keep incision dry and covered.   04/08/24 1351             Signed: Victory PARAS Da Michelle 04/08/2024, 1:51 PM

## 2024-04-08 NOTE — Evaluation (Signed)
 Physical Therapy Brief Evaluation and Discharge Note Patient Details Name: Shannon Stuart MRN: [id number] DOB: [birth year] Today's Date: 04/08/2024   History of Present Illness  64 yo F adm 04/06/24 with Rt posterior frontal meningioma s/Stuart crainotomy and resection. PMHx: bil THA, HLD, anxiety, CAD, HTN  Clinical Impression  Shannon Stuart is very pleasant, unaware of breakfast tray arrival and very willing to mobilize. Pt able to perform transfers without physical assist, walk in hall without LOB and needed some minor cues for direction. Pt lives with sister and is appropriate to return home without further acute Stuart.T. Pt encouraged to continue mobility acutely. Will sign off.   BP 107/63 after gait, 95/64 supine HR 80 SPO2 94% RA        PT Assessment Patient does not need any further PT services  Assistance Needed at Discharge  PRN    Equipment Recommendations None recommended by PT  Recommendations for Other Services       Precautions/Restrictions Precautions Precautions: Other (comment) Recall of Precautions/Restrictions: Intact Precaution/Restrictions Comments: BP <160        Mobility  Bed Mobility   Supine/Sidelying to sit: Modified independent (Device/Increased time)      Transfers Overall transfer level: Needs assistance   Transfers: Sit to/from Stand Sit to Stand: Supervision           General transfer comment: supervision for lines and safety with initial transfer    Ambulation/Gait Ambulation/Gait assistance: Modified independent (Device/Increase time) Gait Distance (Feet): 300 Feet Assistive device: None Gait Pattern/deviations: WFL(Within Functional Limits) Gait Speed: Pace WFL General Gait Details: cues for direction in unfamiliar environment  Home Activity Instructions    Stairs            Modified Rankin (Stroke Patients Only)        Balance Overall balance assessment: No apparent balance deficits (not formally assessed)                         Pertinent Vitals/Pain PT - Brief Vital Signs All Vital Signs Stable: Yes Pain Assessment Faces Pain Scale: Hurts a little bit Pain Location: HA Pain Descriptors / Indicators: Aching Pain Intervention(s): Limited activity within patient's tolerance, Monitored during session, Repositioned     Home Living Family/patient expects to be discharged to:: Private residence Living Arrangements: Other relatives Available Help at Discharge: Family;Available PRN/intermittently Home Environment: Level entry   Home Equipment: Tub bench;Rolling Walker (2 wheels);Rollator (4 wheels);Cane - single point;BSC/3in1;Grab bars - tub/shower   Additional Comments: lives with sister whom she assists at times. Pt driving and working as a PCA    Prior Function Level of Independence: Independent      UE/LE Assessment   UE ROM/Strength/Tone/Coordination: WFL    LE ROM/Strength/Tone/Coordination: Shannon Stuart      Communication   Communication Communication: No apparent difficulties     Cognition Overall Cognitive Status: Impaired Comments: needing repetition of questions a few times during session due to incorrect response, oriented, pleasant     General Comments      Exercises     Assessment/Plan    PT Problem List         PT Visit Diagnosis Other abnormalities of gait and mobility (R26.89)    No Skilled PT All education completed   Co-evaluation                AMPAC 6 Clicks Help needed turning from your back to your side while in a flat bed without using bedrails?:  None Help needed moving from lying on your back to sitting on the side of a flat bed without using bedrails?: None Help needed moving to and from a bed to a chair (including a wheelchair)?: None Help needed standing up from a chair using your arms (e.g., wheelchair or bedside chair)?: None Help needed to walk in hospital room?: None Help needed climbing 3-5 steps with a railing? : None 6 Click  Score: 24      End of Session Equipment Utilized During Treatment: Gait belt Activity Tolerance: Patient tolerated treatment well Patient left: in chair Nurse Communication: Mobility status PT Visit Diagnosis: Other abnormalities of gait and mobility (R26.89)     Time: 9262-9241 PT Time Calculation (min) (ACUTE ONLY): 21 min  Charges:   PT Evaluation $PT Eval Low Complexity: 1 Low      Shannon Stuart, PT Acute Rehabilitation Services Office: [phone]   Shannon [name] Stuart  04/08/2024, 10:47 AM

## 2024-04-08 NOTE — Progress Notes (Signed)
" °   04/08/24 1356  TOC Brief Assessment  Insurance and Status Reviewed  Patient has primary care physician Yes  Home environment has been reviewed home w/ sister  Prior level of function: self  Prior/Current Home Services No current home services  Readmission risk has been reviewed Yes  Transition of care needs no transition of care needs at this time    Pt s/p crani, stable for transition home today, no HH or DME needs noted. Family to transport home.  "

## 2024-04-08 NOTE — Progress Notes (Signed)
 Discharge instructions provided. IV(s) removed. Patient's belongings returned to patient. Patient transported off unit by wheelchair. Departed campus in personal vehicle driven by sister.

## 2024-04-09 ENCOUNTER — Telehealth: Payer: Self-pay

## 2024-04-09 ENCOUNTER — Ambulatory Visit (INDEPENDENT_AMBULATORY_CARE_PROVIDER_SITE_OTHER): Admitting: Audiology

## 2024-04-09 LAB — POCT I-STAT 7, (LYTES, BLD GAS, ICA,H+H)
Acid-Base Excess: 0 mmol/L (ref 0.0–2.0)
Bicarbonate: 23.2 mmol/L (ref 20.0–28.0)
Calcium, Ion: 1.11 mmol/L — ABNORMAL LOW (ref 1.15–1.40)
HCT: 34 % — ABNORMAL LOW (ref 36.0–46.0)
Hemoglobin: 11.6 g/dL — ABNORMAL LOW (ref 12.0–15.0)
O2 Saturation: 99 %
Patient temperature: 35
Potassium: 3.3 mmol/L — ABNORMAL LOW (ref 3.5–5.1)
Sodium: 140 mmol/L (ref 135–145)
TCO2: 24 mmol/L (ref 22–32)
pCO2 arterial: 30.4 mmHg — ABNORMAL LOW (ref 32–48)
pH, Arterial: 7.482 — ABNORMAL HIGH (ref 7.35–7.45)
pO2, Arterial: 98 mmHg (ref 83–108)

## 2024-04-09 NOTE — Transitions of Care (Post Inpatient/ED Visit) (Addendum)
 Today's TOC FU Call Status: Today's TOC FU Call Status:: Successful TOC FU Call Completed TOC FU Call Complete Date: 04/09/24  Shannon Stuart's Name and Date of Birth confirmed. Name, DOB  Transition Care Management Follow-up Telephone Call Date of Discharge: 04/08/24 Discharge Facility: [name]) Type of Discharge: Inpatient Admission Primary Inpatient Discharge Diagnosis:: Right frontal meningioma: Status post craniotomy How have you been since you were released from the hospital?: Better Any questions or concerns?: No  Items Reviewed: Did you receive and understand the discharge instructions provided?: Yes Medications obtained,verified, and reconciled?: Yes (Medications Reviewed) Any new allergies since your discharge?: No Dietary orders reviewed?: NA Do you have support at home?: Yes People in Home [RPT]: sibling(s) Name of Support/Comfort Primary Source: [name]  Sister, Emergency Contact  [phone] (Mobile)  Medications Reviewed Today: Medications Reviewed Today     Reviewed by Carolee Heron NOVAK, RN (Case Manager) on 04/09/24 at 1037  Med List Status: <None>   Medication Order Taking? Sig Documenting Provider Last Dose Status Informant  ALPRAZolam  (XANAX ) 1 MG tablet [id number] Yes TAKE 1 TABLET BY MOUTH 3 TIMES A DAY [name], MD  Active   amLODipine  (NORVASC ) 5 MG tablet [id number] Yes Take one by mouth daily  Shannon Stuart taking differently: Take 5 mg by mouth at bedtime.   [name], MD  Active Self, Pharmacy Records           Med Note (COFFELL, JON CHRISTELLA Schaumann Apr 01, 2024  2:30 PM)    cholecalciferol (VITAMIN D3) 25 MCG (1000 UNIT) tablet [id number] Yes Take 1,000 Units by mouth daily. [provider]  Active Self, Pharmacy Records  clotrimazole -betamethasone  (LOTRISONE ) cream [id number] Yes Apply 1 Application topically 2 (two) times daily.  Shannon Stuart taking differently: Apply 1 Application topically 2 (two) times daily as needed (skin irritation).   [name], MD  Active Self, Pharmacy Records  cyclobenzaprine  (FLEXERIL ) 10 MG tablet [id number] Yes Take 1 tablet (10 mg total) by mouth 3 (three) times daily. [name], MD  Active   cyclobenzaprine  (FLEXERIL ) 10 MG tablet [id number]  TAKE 1 TABLET THREE TIMES DAILY [name], MD  Active            Med Note SYDELL, BING NOVAK   Fri Apr 09, 2024 10:20 AM) DUPLICATE?   diclofenac  Sodium (VOLTAREN ) 1 % GEL [id number] Yes APPLY 2 GRAMS TOPICALLY FOUR TIMES DAILY  Shannon Stuart taking differently: Apply 1 Application topically 4 (four) times daily as needed (pain).   [name], MD  Active Self, Pharmacy Records  furosemide  (LASIX ) 20 MG tablet [id number] Yes TAKE 1 TABLET EVERY DAY [name], MD  Active   ibuprofen  (ADVIL ) 800 MG tablet [id number]  TAKE 1 TABLET BY MOUTH EVERY 8 HOURS AS NEEDED FOR MODERATE HIP PAIN (PAIN SCORE 4-6)  Shannon Stuart taking differently: Take 800 mg by mouth 3 (three) times daily.   [name], MD  Active Self, Pharmacy Records  lisinopril  (ZESTRIL ) 40 MG tablet [id number] Yes Take 1 tablet (40 mg total) by mouth daily. [name], MD  Active Self, Pharmacy Records  magnesium  oxide (MAG-OX) 400 (240 Mg) MG tablet [id number] Yes Take 400 mg by mouth daily. [provider]  Active Self, Pharmacy Records  metoprolol  tartrate (LOPRESSOR ) 25 MG tablet [id number] Yes TAKE 1 TABLET BY MOUTH 2 TIMES A DAY [name], MD  Active Self, Pharmacy Records  Multiple Vitamins-Minerals (MULTIVITAMIN ADULTS 50+) TABS [id number] Yes Take 1  tablet by mouth daily.  Shannon Stuart taking differently: Take 1 tablet by mouth daily. To Hold for a week post op, then can resume per Shannon Stuart stated.   [provider]  Active Self, Pharmacy Records           Med Note SYDELL, BING NOVAK   Fri Apr 09, 2024 10:21 AM) To Hold for a week post op, then can resume per Shannon Stuart stated.   Omega 3-6-9 Fatty Acids (TRIPLE OMEGA COMPLEX PO) [id number] Yes Take 1 capsule by mouth daily.  Shannon Stuart taking  differently: Take 1 capsule by mouth daily. To Hold for a week post op, then can resume per Shannon Stuart stated.   [provider]  Active Self, Pharmacy Records  OVER THE COUNTER MEDICATION [id number] Yes Take 1 capsule by mouth daily. OTC; [name]  Shannon Stuart taking differently: Take 1 capsule by mouth daily. OTC; [name] To Hold for a week post op, then can resume per Shannon Stuart stated.   [provider]  Active Self, Pharmacy Records  OVER THE COUNTER MEDICATION [id number] Yes Take 1 capsule by mouth daily. OTC; combination product : ashwagandha-flax seed oil-sea moss-ginger  Shannon Stuart taking differently: Take 1 capsule by mouth daily. OTC; combination product : ashwagandha-flax seed oil-sea moss-ginger To Hold for a week post op, then can resume per Shannon Stuart stated.   [provider]  Active Self, Pharmacy Records  oxyCODONE -acetaminophen  (PERCOCET) 10-325 MG tablet [id number] Yes Take 1 tablet by mouth every 8 (eight) hours as needed for pain. Colon Shove, MD  Active   pantoprazole  (PROTONIX ) 40 MG tablet [id number] Yes Take one by mouth daily [name], MD  Active Self, Pharmacy Records           Med Note (COFFELL, JON CHRISTELLA Schaumann Apr 01, 2024  2:35 PM)    Potassium 99 MG TABS [id number] Yes Take 99 mg by mouth daily. [provider]  Active Self, Pharmacy Records  rosuvastatin  (CRESTOR ) 40 MG tablet [id number] Yes Take one by mouth daily [name], MD  Active Self, Pharmacy Records           Med Note (COFFELL, JON CHRISTELLA Schaumann Apr 01, 2024  2:36 PM)    senna-docusate (SENOKOT-S) 8.6-50 MG tablet [id number] Yes Take 1 tablet by mouth at bedtime. [provider]  Active Self, Pharmacy Records  traZODone  (DESYREL ) 100 MG tablet [id number] Yes Take one by mouth daily  Shannon Stuart taking differently: Take 100 mg by mouth at bedtime.   [name], MD  Active Self, Pharmacy Records            Home Care and Equipment/Supplies: Were Home Health Services  Ordered?: No Any new equipment or medical supplies ordered?: No  Functional Questionnaire: Do you need assistance with bathing/showering or dressing?: No Do you need assistance with meal preparation?: No (Has contact for [employer] program.) Do you need assistance with eating?: No Do you have difficulty maintaining continence: No Do you have difficulty managing or taking your medications?: No  Follow up appointments reviewed: PCP Follow-up appointment confirmed?: No (Shannon Stuart will call today after this call to scheduled HFU) Specialist Hospital Follow-up appointment confirmed?: Yes Date of Specialist follow-up appointment?:  (Shannon Stuart could not remember exact date, but next week with neurosurgeon.) Follow-Up Specialty Provider:: Dr Colon Do you need transportation to your follow-up appointment?: No Do you understand care options if your condition(s) worsen?: Yes-Shannon Stuart verbalized understanding  SDOH Interventions Today    Flowsheet Row Most Recent  Value  SDOH Interventions   Food Insecurity Interventions Inpatient TOC, Intervention Not Indicated, Other (Comment)  [Shannon Stuart stated no issues and has contact for payor benefit/[employer] contact.]  Housing Interventions Inpatient TOC, Intervention Not Indicated, Other (Comment)  [Shannon Stuart states she has stable housing and does not recall answering yes to any issues.]  Transportation Interventions Intervention Not Indicated, Shannon Stuart Resources (Friends/Family), Payor Benefit  Utilities Interventions Intervention Not Indicated    Goals Addressed             This Visit's Progress    VBCI Transitions of Care (TOC) Care Plan       Problems:  Recent Hospitalization for treatment of removal of benign meningioma.  Knowledge Deficit Related to Meningioma s/p craniotomy and  No Hospital Follow Up Provider appointment  Shannon Stuart to scheduled after this call today, 04/09/2024.   Goal:  Over the next 30 days, the Shannon Stuart will  not experience hospital readmission  Interventions:  Transitions of Care: Annual Eye Exam Shannon Stuart will review with surgeon after post op recovery/follow up.  Doctor Visits  - discussed the importance of doctor visits Post-op wound/incision care reviewed with Shannon Stuart/caregiver Reviewed Signs and symptoms of infection for craniotomy incision.  Schedule PCP Hospital follow up: Rationale given, Shannon Stuart has good communication with PCP she stated and will contact.  Shannon Stuart called back and reported both eyes swollen and weeping, instructed to contact neurosurgeon's office as this is likely dependent swelling overnight but need to check with provider: Patient verbally agreed to contact neurosurgeon's office, not affecting vision,  no there changes noted by Shannon Stuart.   Shannon Stuart Self Care Activities:  Attend all scheduled provider appointments Call pharmacy for medication refills 3-7 days in advance of running out of medications Call provider office for new concerns or questions  Notify RN Care Manager of [facility] call rescheduling needs Perform all self care activities independently  Take medications as prescribed   Reschedule hearing appointment  Contact PCP for hospital follow up appointment.  Complete post op follow up with neurosurgeon next week.  Contact [employer] for post op meals, has contact information.   Discharge Instructions (Per DC Summary/AVS) Reviewed      Call MD for:  redness, tenderness, or signs of infection (pain, swelling, redness, odor or green/yellow discharge around incision site)   Complete by: As directed      Call MD for:  severe uncontrolled pain   Complete by: As directed      Call MD for:  temperature >100.4   Complete by: As directed      Discharge wound care:   Complete by: As directed      Keep incision dry and covered.    Increase activity slowly   Complete by: As directed     Shannon Stuart to call provider regarding increase swelling encompassing both eye  areas now on call back.   Plan:  An initial telephone outreach has been scheduled for: 04/16/2024 at 1000 am.         Shannon Stuart to call provider regarding increase swelling encompassing both eye areas now on call back to RN CM after initial call completed.  The Shannon Stuart has been provided with contact information for the care management team and has been advised to call with any health-related questions or concerns.  The Shannon Stuart verbalized understanding with current POC.  The Shannon Stuart is directed to their insurance card regarding availability of benefits coverage    Plan:  An initial telephone outreach has been scheduled for:  04/16/2024 at 1000 am.   Bing Edison MSN, RN RN Case Manager [facility]  VBCI-Population Health Office Hours M-F [phone] Direct Dial: [phone] Main Phone [phone]  Fax: [phone] [email or web address]

## 2024-04-09 NOTE — Patient Instructions (Signed)
 Visit Information  Thank you for taking time to visit with me today. Please don't hesitate to contact me if I can be of assistance to you before our next scheduled telephone appointment.  Patient to call provider regarding increase swelling encompassing both eye areas now on call back.   Plan:  An initial telephone outreach has been scheduled for: 04/16/2024 at 1000 am.  Following is a copy of your care plan:   Goals Addressed             This Visit's Progress    VBCI Transitions of Care (TOC) Care Plan       Problems:  Recent Hospitalization for treatment of removal of benign meningioma.  Knowledge Deficit Related to Meningioma s/p craniotomy and  No Hospital Follow Up Provider appointment  Patient to scheduled after this call today, 04/09/2024.   Goal:  Over the next 30 days, the patient will not experience hospital readmission  Interventions:  Transitions of Care: Annual Eye Exam Patient will review with surgeon after post op recovery/follow up.  Doctor Visits  - discussed the importance of doctor visits Post-op wound/incision care reviewed with patient/caregiver Reviewed Signs and symptoms of infection for craniotomy incision.  Schedule PCP Hospital follow up: Rationale given, patient has good communication with PCP she stated and will contact.  Patient called back and reported both eyes swollen and weeping, instructed to contact neurosurgeon's office as this is likely dependent swelling overnight but need to check with provider: Patient verbally agreed to contact neurosurgeon's office, not affecting vision,  no there changes noted by patient.   Patient Self Care Activities:  Attend all scheduled provider appointments Call pharmacy for medication refills 3-7 days in advance of running out of medications Call provider office for new concerns or questions  Notify RN Care Manager of [facility] call rescheduling needs Perform all self care activities independently  Take medications as  prescribed   Reschedule hearing appointment  Contact PCP for hospital follow up appointment.  Complete post op follow up with neurosurgeon next week.  Discharge Instructions       Call MD for:  redness, tenderness, or signs of infection (pain, swelling, redness, odor or green/yellow discharge around incision site)   Complete by: As directed      Call MD for:  severe uncontrolled pain   Complete by: As directed      Call MD for:  temperature >100.4   Complete by: As directed      Discharge wound care:   Complete by: As directed      Keep incision dry and covered.    Increase activity slowly   Complete by: As directed    Patient to call provider regarding increase swelling encompassing both eye areas now on call back.   Plan:  An initial telephone outreach has been scheduled for: 04/16/2024 at 1000 am.         Patient verbalizes understanding of instructions and care plan provided today and agrees to view in MyChart. Active MyChart status and patient understanding of how to access instructions and care plan via MyChart confirmed with patient.     Telephone follow up appointment with care management team member scheduled for: An initial telephone outreach has been scheduled for: 04/16/2024 at 1000 am.   Please call the care guide team at [phone] if you need to cancel or reschedule your appointment.   Please call the [place]  National Suicide Prevention Lifeline: 907 144 8470 or TTY: 657-265-6830 TTY 470-723-1132) to talk to a trained  counselor call 1-800-273-TALK (toll free, 24 hour hotline) if you are experiencing a Mental Health or Behavioral Health Crisis or need someone to talk to.   Bing Edison MSN, RN RN Case Sales [facility]  VBCI-Population Health Office Hours M-F [phone] Direct Dial: [phone] Main Phone [phone]  Fax: [phone] [email or web address]

## 2024-04-15 ENCOUNTER — Telehealth: Payer: Self-pay

## 2024-04-15 ENCOUNTER — Telehealth: Payer: Self-pay | Admitting: *Deleted

## 2024-04-15 ENCOUNTER — Other Ambulatory Visit: Payer: Self-pay

## 2024-04-15 DIAGNOSIS — E11A Type 2 diabetes mellitus without complications in remission: Secondary | ICD-10-CM

## 2024-04-15 NOTE — Progress Notes (Signed)
 Complex Care Management Note Care Guide Note  04/15/2024 Name: Shannon Stuart MRN: [id number] DOB: 03-28-1960   Complex Care Management Outreach Attempts: An unsuccessful telephone outreach was attempted today to offer the patient information about available complex care management services.  Follow Up Plan:  Additional outreach attempts will be made to offer the patient complex care management information and services.   Encounter Outcome:  Patient Request to Call Back  Harlene Satterfield  [facility]  [facility] Guide  Direct Dial: [phone]  Fax [phone]

## 2024-04-16 ENCOUNTER — Telehealth: Payer: Self-pay

## 2024-04-16 MED ORDER — OXYCODONE-ACETAMINOPHEN 10-325 MG PO TABS: 1.0000 | ORAL_TABLET | Freq: Three times a day (TID) | ORAL | 0 refills | Status: AC

## 2024-04-21 ENCOUNTER — Ambulatory Visit (INDEPENDENT_AMBULATORY_CARE_PROVIDER_SITE_OTHER): Admitting: Family Medicine

## 2024-04-21 ENCOUNTER — Encounter: Payer: Self-pay | Admitting: Family Medicine

## 2024-04-21 VITALS — BP 117/78 | HR 96 | Wt 221.8 lb

## 2024-04-21 DIAGNOSIS — F418 Other specified anxiety disorders: Secondary | ICD-10-CM

## 2024-04-21 DIAGNOSIS — I10 Essential (primary) hypertension: Secondary | ICD-10-CM | POA: Diagnosis not present

## 2024-04-21 DIAGNOSIS — R269 Unspecified abnormalities of gait and mobility: Secondary | ICD-10-CM

## 2024-04-21 DIAGNOSIS — G894 Chronic pain syndrome: Secondary | ICD-10-CM | POA: Diagnosis not present

## 2024-04-21 MED ORDER — LIDOCAINE 5 % EX PTCH: MEDICATED_PATCH | CUTANEOUS | 2 refills | Status: AC

## 2024-04-21 MED ORDER — LIDOCAINE 5 % EX PTCH: MEDICATED_PATCH | CUTANEOUS | 2 refills | Status: DC

## 2024-04-21 NOTE — Patient Instructions (Signed)
 It is Ok to get a flu shot! I am glad your surgery went so well! You should hear from PT in a few days.

## 2024-04-22 NOTE — Progress Notes (Signed)
" ° ° °  CHIEF COMPLAINT / HPI:  1. F/u recent hospitalization for surgical removal meningioma. Doing well Still having significant bruising around eye but swelling improved. Having difficulty with balance. Desires PT eval/referral   PERTINENT  PMH / PSH: I have reviewed the patients medications, allergies, past medical and surgical history, smoking status and updated in the EMR as appropriate.   OBJECTIVE:  BP 117/78   Pulse 96   Wt 221 lb 12.8 oz (100.6 kg)   LMP 09/20/2011   SpO2 96%   BMI 33.72 kg/m  Vital signs reviewed. GENERAL: Well-developed, well-nourished, no acute distress. CARDIOVASCULAR: Regular rate and rhythm  MSK: Movement of extremity x 4. Arises from a chair without assitance other than chair arms. Initial slight unsteadiness improves. GAIT: wide base.    ASSESSMENT / PLAN: Gait abnormality likely secondary to sequelae of meningioma resection. PT referral 2. Chronic pain: continue chronic pain meds with refills given. 3. Chronic anxiety-stable on current meds, no changes F/u 2-3 m  Benign hypertension BP well controlled Continue amlodipine    Camie Mulch MD "
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
## 2024-04-22 NOTE — Assessment & Plan Note (Signed)
 BP well controlled.  Continue amlodipine. ?

## 2024-04-22 NOTE — Progress Notes (Signed)
 Complex Care Management Note  Care Guide Note 04/22/2024 Name: Adeena Bernabe MRN: [id number] DOB: 12-22-1960  Leiann Sporer is a 64 y.o. year old female who sees [name], MD for primary care. I reached out to [name] by phone today to offer complex care management services.  Ms. Blough was given information about Complex Care Management services today including:   The Complex Care Management services include support from the care team which includes your Nurse Care Manager, Clinical Social Worker, or Pharmacist.  The Complex Care Management team is here to help remove barriers to the health concerns and goals most important to you. Complex Care Management services are voluntary, and the patient may decline or stop services at any time by request to their care team member.   Complex Care Management Consent Status: Patient agreed to services and verbal consent obtained.   Follow up plan:  Telephone appointment with complex care management team member scheduled for:  05/03/24  Encounter Outcome:  Patient Scheduled  Harlene Satterfield  [facility]  [place], [facility] Guide  Direct Dial: [phone]  Fax [phone]

## 2024-04-23 ENCOUNTER — Encounter: Payer: Self-pay | Admitting: Physical Therapy

## 2024-04-23 ENCOUNTER — Other Ambulatory Visit: Payer: Self-pay

## 2024-04-23 ENCOUNTER — Ambulatory Visit: Attending: Family Medicine | Admitting: Physical Therapy

## 2024-04-23 VITALS — BP 109/66 | HR 63

## 2024-04-23 DIAGNOSIS — R2689 Other abnormalities of gait and mobility: Secondary | ICD-10-CM | POA: Diagnosis present

## 2024-04-23 DIAGNOSIS — M6281 Muscle weakness (generalized): Secondary | ICD-10-CM | POA: Insufficient documentation

## 2024-04-23 DIAGNOSIS — R29898 Other symptoms and signs involving the musculoskeletal system: Secondary | ICD-10-CM | POA: Diagnosis present

## 2024-04-23 DIAGNOSIS — Z9181 History of falling: Secondary | ICD-10-CM | POA: Diagnosis present

## 2024-04-23 DIAGNOSIS — R29818 Other symptoms and signs involving the nervous system: Secondary | ICD-10-CM | POA: Insufficient documentation

## 2024-04-23 DIAGNOSIS — R269 Unspecified abnormalities of gait and mobility: Secondary | ICD-10-CM | POA: Insufficient documentation
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
## 2024-04-27 ENCOUNTER — Ambulatory Visit: Admitting: Physical Therapy

## 2024-04-27 ENCOUNTER — Encounter: Payer: Self-pay | Admitting: Physical Therapy

## 2024-04-27 VITALS — BP 115/71 | HR 67

## 2024-04-27 DIAGNOSIS — R29898 Other symptoms and signs involving the musculoskeletal system: Secondary | ICD-10-CM

## 2024-04-27 DIAGNOSIS — R29818 Other symptoms and signs involving the nervous system: Secondary | ICD-10-CM

## 2024-04-27 DIAGNOSIS — Z9181 History of falling: Secondary | ICD-10-CM

## 2024-04-27 DIAGNOSIS — M6281 Muscle weakness (generalized): Secondary | ICD-10-CM

## 2024-04-27 DIAGNOSIS — R2689 Other abnormalities of gait and mobility: Secondary | ICD-10-CM

## 2024-04-29 ENCOUNTER — Telehealth

## 2024-04-30 ENCOUNTER — Ambulatory Visit: Admitting: Physical Therapy

## 2024-04-30 ENCOUNTER — Other Ambulatory Visit: Payer: Self-pay

## 2024-04-30 NOTE — Patient Instructions (Signed)
 Visit Information  Thank you for taking time to visit with me today. Please don't hesitate to contact me if I can be of assistance to you before our next scheduled telephone appointment.  A telephone outreach has been scheduled for: 05/06/24 at 330 pm.  Following is a copy of your care plan:   Goals Addressed             This Visit's Progress    VBCI Transitions of Care (TOC) Care Plan   On track    Reviewed and or Updated with patient on this call today, 04/30/24:   Problems:  Recent Hospitalization for treatment of removal of benign meningioma.  Knowledge Deficit Related to Meningioma s/p craniotomy and  No Hospital Follow Up Provider appointment. Completed.  Goal:  Over the next 30 days, the patient will not experience hospital readmission  Interventions:  Transitions of Care: Patient in good spirits despite ongoing headache and recovery process, has completed PCP HFU and neurosurgical follow up. Pending a cardiology referral.  Reviewed medications, allergies, SDOH, ROS assessment, follow up appointments, post follow up instructions.  No red flags reported by patient or noted via phone call.  Annual Eye Exam Patient will review with surgeon after post op recovery/follow up.  Doctor Visits  - discussed the importance of doctor visits Post-op wound/incision care reviewed with patient/caregiver Reviewed Signs and symptoms of infection for craniotomy incision.  Reviewed with patient today, reports no issues, no drainage/tenderness, or fever.  Reviewed PCP follow up appointment.  No medication changes. Wants patient to have a cardiology referral for non-specific intermittent mid/right chest but with LEFT arm tingling.  Patient told by PCP and neurosurgeon to do as little as possible to rest.  Continues to have headaches, some dizziness, decreased swelling, gait imbalances.  Neurosurgeon prescribed additional prn pain medication as continued to have headaches.  Patient reports was  told headaches from air entering crani cavity during surgery and will take some time to go away.  Swelling improved but still present, vision blurriness, Reviewed Neuro-Rehab appointment from today.  Patient reports a good experience, gait drift confirmed with PT, home exercises given for strength and balance--between doing as little as possible per provider orders.    Patient Self Care Activities:  Attend all scheduled provider appointments. Recommended patient take blood pressure 3x/week to monitor for trends while on medications, keep a log, take to all provider appointments.  Review all follow up appointment instructions.  Fall precautions due to impaired gait, impaired vision, gait drift--using cane at home.  OP PT 2x/week with neuro-rehab.  Call pharmacy for medication refills 3-7 days in advance of running out of medications Call provider office for new concerns or questions  Notify RN Care Manager of [facility] call rescheduling needs Perform all self care activities independently  Take medications as prescribed   Rescheduled hearing appointment  Contact PCP for hospital follow up appointment.  Completed 04/21/24 Complete post op follow up with neurosurgeon next week.  Completed 04/16/24.   Discharge Instructions       Call MD for:  redness, tenderness, or signs of infection (pain, swelling, redness, odor or green/yellow discharge around incision site)   Complete by: As directed      Call MD for:  severe uncontrolled pain   Complete by: As directed      Call MD for:  temperature >100.4   Complete by: As directed      Discharge wound care:   Complete by: As directed  Keep incision dry and covered.    Increase activity slowly   Complete by: As directed     Limit activity as much as possible per patient on follow up instructions from providers.  Plan:  A telephone outreach has been scheduled for: 05/06/24 at 3 pm.        Patient verbalizes understanding of instructions and  care plan provided today and agrees to view in MyChart. Active MyChart status and patient understanding of how to access instructions and care plan via MyChart confirmed with patient.     Telephone follow up appointment with care management team member scheduled for: A telephone outreach has been scheduled for: 05/06/24 at 330 pm.  Please call the care guide team at [phone] if you need to cancel or reschedule your appointment.   Please call the [place]  National Suicide Prevention Lifeline: 272-622-5344 or TTY: 8326124909 TTY 775-790-3557) to talk to a trained counselor call 1-800-273-TALK (toll free, 24 hour hotline) if you are experiencing a Mental Health or Behavioral Health Crisis or need someone to talk to.   Bing Edison MSN, RN RN Case [employer] [facility]  VBCI-Population Health Office Hours M-F [phone] Direct Dial: [phone] Main Phone [phone]  Fax: [phone] [email or web address]
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
## 2024-05-03 ENCOUNTER — Telehealth

## 2024-05-03 ENCOUNTER — Ambulatory Visit: Payer: Self-pay | Admitting: Student

## 2024-05-04 ENCOUNTER — Ambulatory Visit: Admitting: Physical Therapy

## 2024-05-06 ENCOUNTER — Ambulatory Visit: Admitting: Physical Therapy

## 2024-05-06 ENCOUNTER — Telehealth

## 2024-05-10 ENCOUNTER — Ambulatory Visit: Admitting: Physical Therapy

## 2024-05-12 ENCOUNTER — Ambulatory Visit: Admitting: Physical Therapy

## 2024-05-14 ENCOUNTER — Ambulatory Visit (INDEPENDENT_AMBULATORY_CARE_PROVIDER_SITE_OTHER): Admitting: Audiology

## 2024-05-17 ENCOUNTER — Ambulatory Visit: Admitting: Physical Therapy

## 2024-05-19 ENCOUNTER — Ambulatory Visit: Admitting: Physical Therapy

## 2024-05-24 ENCOUNTER — Encounter

## 2024-05-24 ENCOUNTER — Other Ambulatory Visit
